# Patient Record
Sex: Female | Born: 1950
Health system: Southern US, Community
[De-identification: ages and names within clinical notes are randomized; demographics above are authoritative.]

## PROBLEM LIST (undated history)

## (undated) DIAGNOSIS — F32A Depression, unspecified: Secondary | ICD-10-CM

## (undated) DIAGNOSIS — R0600 Dyspnea, unspecified: Secondary | ICD-10-CM

## (undated) DIAGNOSIS — N952 Postmenopausal atrophic vaginitis: Secondary | ICD-10-CM

## (undated) DIAGNOSIS — R32 Unspecified urinary incontinence: Secondary | ICD-10-CM

## (undated) DIAGNOSIS — E785 Hyperlipidemia, unspecified: Secondary | ICD-10-CM

## (undated) DIAGNOSIS — N289 Disorder of kidney and ureter, unspecified: Secondary | ICD-10-CM

## (undated) DIAGNOSIS — E669 Obesity, unspecified: Secondary | ICD-10-CM

## (undated) DIAGNOSIS — N390 Urinary tract infection, site not specified: Secondary | ICD-10-CM

## (undated) DIAGNOSIS — F329 Major depressive disorder, single episode, unspecified: Secondary | ICD-10-CM

## (undated) DIAGNOSIS — I10 Essential (primary) hypertension: Secondary | ICD-10-CM

## (undated) DIAGNOSIS — F419 Anxiety disorder, unspecified: Secondary | ICD-10-CM

## (undated) DIAGNOSIS — E119 Type 2 diabetes mellitus without complications: Secondary | ICD-10-CM

## (undated) DIAGNOSIS — I219 Acute myocardial infarction, unspecified: Secondary | ICD-10-CM

## (undated) HISTORY — DX: Major depressive disorder, single episode, unspecified: F32.9

## (undated) HISTORY — DX: Anxiety disorder, unspecified: F41.9

## (undated) HISTORY — DX: Unspecified urinary incontinence: R32

## (undated) HISTORY — DX: Essential (primary) hypertension: I10

## (undated) HISTORY — DX: Urinary tract infection, site not specified: N39.0

## (undated) HISTORY — DX: Postmenopausal atrophic vaginitis: N95.2

## (undated) HISTORY — DX: Type 2 diabetes mellitus without complications: E11.9

## (undated) HISTORY — PX: ROTATOR CUFF REPAIR: SHX139

## (undated) HISTORY — DX: Obesity, unspecified: E66.9

## (undated) HISTORY — PX: CARDIAC CATHETERIZATION: SHX172

## (undated) HISTORY — DX: Disorder of kidney and ureter, unspecified: N28.9

## (undated) HISTORY — DX: Depression, unspecified: F32.A

## (undated) HISTORY — DX: Acute myocardial infarction, unspecified: I21.9

## (undated) HISTORY — DX: Hyperlipidemia, unspecified: E78.5

## (undated) HISTORY — PX: NECK SURGERY: SHX720

## (undated) HISTORY — PX: DENTAL SURGERY: SHX609

---

## 1999-02-11 HISTORY — PX: BUNIONECTOMY: SHX129

## 2002-07-29 ENCOUNTER — Encounter: Admission: RE | Admit: 2002-07-29 | Discharge: 2002-07-29 | Payer: Self-pay | Admitting: Neurosurgery

## 2002-07-29 ENCOUNTER — Encounter: Payer: Self-pay | Admitting: Neurosurgery

## 2002-08-18 ENCOUNTER — Ambulatory Visit (HOSPITAL_COMMUNITY): Admission: RE | Admit: 2002-08-18 | Discharge: 2002-08-19 | Payer: Self-pay | Admitting: Neurosurgery

## 2002-08-18 ENCOUNTER — Encounter: Payer: Self-pay | Admitting: Neurosurgery

## 2002-09-26 ENCOUNTER — Encounter: Admission: RE | Admit: 2002-09-26 | Discharge: 2002-09-26 | Payer: Self-pay

## 2002-09-26 ENCOUNTER — Encounter: Payer: Self-pay | Admitting: Neurosurgery

## 2005-06-28 ENCOUNTER — Ambulatory Visit: Payer: Self-pay | Admitting: Orthopedic Surgery

## 2005-07-31 ENCOUNTER — Ambulatory Visit (HOSPITAL_BASED_OUTPATIENT_CLINIC_OR_DEPARTMENT_OTHER): Admission: RE | Admit: 2005-07-31 | Discharge: 2005-08-01 | Payer: Self-pay | Admitting: Orthopedic Surgery

## 2007-04-11 DIAGNOSIS — I219 Acute myocardial infarction, unspecified: Secondary | ICD-10-CM

## 2007-04-11 HISTORY — DX: Acute myocardial infarction, unspecified: I21.9

## 2007-05-06 ENCOUNTER — Other Ambulatory Visit: Payer: Self-pay

## 2007-05-06 ENCOUNTER — Inpatient Hospital Stay: Payer: Self-pay | Admitting: Specialist

## 2007-06-16 ENCOUNTER — Encounter: Payer: Self-pay | Admitting: Internal Medicine

## 2007-07-12 ENCOUNTER — Encounter: Payer: Self-pay | Admitting: Internal Medicine

## 2007-08-11 ENCOUNTER — Encounter: Payer: Self-pay | Admitting: Internal Medicine

## 2007-09-11 ENCOUNTER — Encounter: Payer: Self-pay | Admitting: Internal Medicine

## 2007-10-12 ENCOUNTER — Encounter: Payer: Self-pay | Admitting: Internal Medicine

## 2012-02-25 LAB — HM PAP SMEAR: HM Pap smear: NEGATIVE

## 2012-03-17 ENCOUNTER — Ambulatory Visit: Payer: Self-pay | Admitting: Obstetrics & Gynecology

## 2014-06-28 ENCOUNTER — Other Ambulatory Visit: Payer: Self-pay | Admitting: Urology

## 2014-06-28 DIAGNOSIS — R319 Hematuria, unspecified: Secondary | ICD-10-CM

## 2014-07-05 ENCOUNTER — Ambulatory Visit
Admission: RE | Admit: 2014-07-05 | Discharge: 2014-07-05 | Disposition: A | Discharge status: Home / Self Care | Source: Ambulatory Visit | Attending: Urology | Admitting: Urology

## 2014-07-05 DIAGNOSIS — R319 Hematuria, unspecified: Secondary | ICD-10-CM

## 2014-07-05 MED ORDER — IOHEXOL 300 MG/ML  SOLN
125.0000 mL | Freq: Once | INTRAMUSCULAR | Status: AC | PRN
  Administered 2014-07-05: 125 mL via INTRAVENOUS

## 2014-07-16 ENCOUNTER — Telehealth: Payer: Self-pay | Admitting: Urology

## 2014-07-16 NOTE — Telephone Encounter (Signed)
We need to call [phone] to obtain prior authorization for her Myrbetriq.    [facility] Attn: [place] [address]

## 2014-07-17 ENCOUNTER — Other Ambulatory Visit: Payer: Self-pay | Admitting: Family Medicine

## 2014-07-17 ENCOUNTER — Other Ambulatory Visit: Payer: Self-pay

## 2014-07-17 DIAGNOSIS — N2889 Other specified disorders of kidney and ureter: Secondary | ICD-10-CM

## 2014-07-18 ENCOUNTER — Encounter: Payer: Self-pay | Admitting: *Deleted

## 2014-07-20 ENCOUNTER — Ambulatory Visit
Admission: RE | Admit: 2014-07-20 | Discharge: 2014-07-20 | Disposition: A | Discharge status: Home / Self Care | Source: Ambulatory Visit | Attending: Urology | Admitting: Urology

## 2014-07-20 ENCOUNTER — Encounter: Payer: Self-pay | Admitting: *Deleted

## 2014-07-20 DIAGNOSIS — N2889 Other specified disorders of kidney and ureter: Secondary | ICD-10-CM | POA: Diagnosis present

## 2014-07-20 DIAGNOSIS — N289 Disorder of kidney and ureter, unspecified: Secondary | ICD-10-CM | POA: Insufficient documentation

## 2014-07-20 DIAGNOSIS — N281 Cyst of kidney, acquired: Secondary | ICD-10-CM | POA: Diagnosis not present

## 2014-07-20 MED ORDER — GADOBENATE DIMEGLUMINE 529 MG/ML IV SOLN
20.0000 mL | Freq: Once | INTRAVENOUS | Status: AC | PRN
  Administered 2014-07-20: 20 mL via INTRAVENOUS

## 2014-07-24 ENCOUNTER — Ambulatory Visit (INDEPENDENT_AMBULATORY_CARE_PROVIDER_SITE_OTHER): Admitting: Urology

## 2014-07-24 ENCOUNTER — Encounter: Payer: Self-pay | Admitting: Urology

## 2014-07-24 VITALS — BP 148/84 | HR 84 | Ht 66.0 in | Wt 190.4 lb

## 2014-07-24 DIAGNOSIS — R312 Other microscopic hematuria: Secondary | ICD-10-CM

## 2014-07-24 DIAGNOSIS — G44209 Tension-type headache, unspecified, not intractable: Secondary | ICD-10-CM | POA: Insufficient documentation

## 2014-07-24 DIAGNOSIS — N281 Cyst of kidney, acquired: Secondary | ICD-10-CM | POA: Insufficient documentation

## 2014-07-24 DIAGNOSIS — E119 Type 2 diabetes mellitus without complications: Secondary | ICD-10-CM | POA: Insufficient documentation

## 2014-07-24 DIAGNOSIS — E782 Mixed hyperlipidemia: Secondary | ICD-10-CM | POA: Insufficient documentation

## 2014-07-24 DIAGNOSIS — E1169 Type 2 diabetes mellitus with other specified complication: Secondary | ICD-10-CM | POA: Insufficient documentation

## 2014-07-24 DIAGNOSIS — R3129 Other microscopic hematuria: Secondary | ICD-10-CM | POA: Insufficient documentation

## 2014-07-24 DIAGNOSIS — Q6100 Congenital renal cyst, unspecified: Secondary | ICD-10-CM

## 2014-07-24 DIAGNOSIS — E785 Hyperlipidemia, unspecified: Secondary | ICD-10-CM | POA: Insufficient documentation

## 2014-07-24 NOTE — Progress Notes (Addendum)
07/24/2014 8:25 AM   Melissa Arroyo [birth year] [id number]  Referring provider: Glendon Axe, MD [facility] [place], [place] [address]  Chief Complaint  Patient presents with  . Follow up Diagnostic test    MRI results    HPI: Melissa Arroyo is a 64 year old white female who underwent MR of the kidneys after CT urogram discovered a suspicious lesion in the left kidney.  Patient underwent CT urogram for microscopic hematuria. She does have a history of recurrent urinary tract infection and urge incontinence.  So far, her recurrent urinary tract infections have been controlled with vaginal estrogen cream and cranberry tablets. Her urge incontinence is well controlled with Myrbetriq.  She denies any gross hematuria. She currently denies any dysuria, fevers, chills, nausea, vomiting or suprapubic pain.  MR of the kidneys has noted a Bosniak 2 cyst in the left kidney. Report is in the pertinent imaging section.   PMH: Past Medical History  Diagnosis Date  . Depression   . Hyperlipidemia   . Anxiety   . Heart attack   . Diabetes mellitus   . Obesity   . Vaginal atrophy   . Urinary incontinence in female   . Renal lesion   . Recurrent UTI   . Hypertension     Surgical History: Past Surgical History  Procedure Laterality Date  . Bunionectomy  2001  . Rotator cuff repair Left   . Neck surgery      Home Medications:    Medication List       This list is accurate as of: 07/24/14  8:25 AM.  Always use your most recent med list.               buPROPion 150 MG 24 hr tablet  Commonly known as:  WELLBUTRIN XL  TAKE 1 TABLET IN THE MORNING FOR 7 DAYS THEN TAKE 2 TABLETS IN THE MORNING     clonazePAM 1 MG tablet  Commonly known as:  KLONOPIN  TAKE 1/2 TO 1 TABLET BY MOUTH TWICE A DAY     diazepam 10 MG tablet  Commonly known as:  VALIUM  TAKE 30MINS PRIOR TO THE MRI     glipiZIDE 10 MG 24 hr tablet  Commonly known as:  GLUCOTROL XL  TAKE 1  TABLET (10 MG) BY ORAL ROUTE ONCE DAILY WITH BREAKFAST     lovastatin 40 MG tablet  Commonly known as:  MEVACOR  Take 40 mg by mouth at bedtime.     meclizine 12.5 MG tablet  Commonly known as:  ANTIVERT  TAKE 1 TABLET BY MOUTH EVERY 12HRS FOR VERTIGO     metFORMIN 500 MG 24 hr tablet  Commonly known as:  GLUCOPHAGE-XR  TAKE 2 TABLETS BY ORAL ROUTE 2 TIMES A DAY WITH MEALS FOR 90 DAYS     MYRBETRIQ 25 MG Tb24 tablet  Generic drug:  mirabegron ER  Take 25 mg by mouth daily.     nitrofurantoin (macrocrystal-monohydrate) 100 MG capsule  Commonly known as:  MACROBID  Take 100 mg by mouth 2 (two) times daily.     OLANZapine-FLUoxetine 6-25 MG per capsule  Commonly known as:  SYMBYAX  Take 1 capsule by mouth daily.     OLANZapine-FLUoxetine 6-50 MG per capsule  Commonly known as:  SYMBYAX  Take 1 capsule by mouth daily.     phenazopyridine 100 MG tablet  Commonly known as:  PYRIDIUM  TAKE 1 TABLET BY MOUTH EVERY 8 HOURS AS NEEDED  FOR UP TO 2 DAYS WITH ANTIBIOTICS     propranolol 20 MG tablet  Commonly known as:  INDERAL  TAKE 1 TABLET TWICE A DAY(MIGRAINE PROPHYLAXSIS)        Allergies:  Allergies  Allergen Reactions  . Penicillins Rash  . Vesicare [Solifenacin] Anaphylaxis    Throat Closed    Family History: Family History  Problem Relation Age of Onset  . Hypertension Mother   . Cancer Mother     Luekemia  . Cancer      Bladder,Kidney,Prostate    Social History:  reports that she has never smoked. She does not have any smokeless tobacco history on file. She reports that she does not drink alcohol or use illicit drugs.  ROS: Urological Symptom Review  Patient is experiencing the following symptoms: Get up at night to urinate Leakage of urine Urinary tract infection   Review of Systems  Gastrointestinal (upper)  : Negative for upper GI symptoms  Gastrointestinal (lower) : Negative for lower GI symptoms  Constitutional : Negative for  symptoms  Skin: Negative for skin symptoms  Eyes: Negative for eye symptoms  Ear/Nose/Throat : Negative for Ear/Nose/Throat symptoms  Hematologic/Lymphatic: Negative for Hematologic/Lymphatic symptoms  Cardiovascular : Negative for cardiovascular symptoms  Respiratory : Negative for respiratory symptoms  Endocrine: Negative for endocrine symptoms  Musculoskeletal: Negative for musculoskeletal symptoms  Neurological: Negative for neurological symptoms  Psychologic: Depression Anxiety   Physical Exam: BP 148/84 mmHg  Pulse 84  Ht 5\' 6"  (1.676 m)  Wt 190 lb 6.4 oz (86.365 kg)  BMI 30.75 kg/m2   Laboratory Data: No results found for: WBC, HGB, HCT, MCV, PLT  No results found for: CREATININE  No results found for: PSA  No results found for: TESTOSTERONE  No results found for: HGBA1C  Urinalysis No results found for: COLORURINE, APPEARANCEUR, LABSPEC, PHURINE, GLUCOSEU, HGBUR, BILIRUBINUR, KETONESUR, PROTEINUR, UROBILINOGEN, NITRITE, LEUKOCYTESUR  Pertinent Imaging: CLINICAL DATA: Evaluate left kidney lesion demonstrated on recent CT scan.  EXAM: MRI ABDOMEN WITHOUT AND WITH CONTRAST  TECHNIQUE: Multiplanar multisequence MR imaging of the abdomen was performed both before and after the administration of intravenous contrast.  CONTRAST: 63mL MULTIHANCE GADOBENATE DIMEGLUMINE 529 MG/ML IV SOLN  COMPARISON: CT scan 07/05/2014  FINDINGS: Lower chest: No lung base lesions are identified. No pleural effusion. The heart is within normal limits in size. No pericardial effusion.  Hepatobiliary: No focal hepatic lesions or intrahepatic biliary dilatation. The gallbladder is normal. No common bile duct dilatation.  Pancreas: No focal pancreatic lesion or inflammatory changes. Small fatty clefts are noted. No ductal dilatation.  Spleen: Normal size. No focal lesions.  Adrenals/Urinary Tract: The adrenal glands are normal. The 8 mm  left lower pole intra pole junction region lesion has bright T1 signal intensity and is not demonstrate any definite contrast enhancement. Findings consistent with a hemorrhagic cyst. Stable scarring changes involving the right kidney. No worrisome renal lesions or hydronephrosis.  Stomach/Bowel: The stomach is moderately distended with fluid and food. The duodenum, visualized small bowel and visualized colon are grossly normal.  Vascular/Lymphatic: No mesenteric or retroperitoneal mass or adenopathy. The aorta and branch vessels are patent. The major venous structures are patent.  Other: No free abdominal fluid collections or abdominal wall hernia.  Musculoskeletal: No significant osseous findings.  IMPRESSION: 8 mm left renal lesion has MR imaging features of a benign hemorrhagic cyst (Bosniak 2).  No other significant abdominal findings.   Electronically Signed  By: Marijo Sanes M.D.  On: 07/20/2014 14:29  Assessment & Plan:   1. Microscopic hematuria- Patient has completed CT urogram and MR of kidneys. So far, Bosniak 2 cyst has been identified, no malignancies.  Patient will be scheduled for cystoscopic examination for completion of hematuria workup.  Patient states her husband had numerous cystoscopic examination, so she is well aware of the procedure and what to expect afterwards.  2. Bosniak 2 left renal cyst- With Bosniak 2 cysts, no follow-up is needed.  There is approximately 0% chance of malignancy with Bosniak 2 cysts.  3. Recurrent UTI's-  Patient's urinary tract infections have been controlled with vaginal estrogen cream and cranberry tablets. She will continue these and follow up in one year's time. If she should develop symptoms of a urinary tract infection, he should present to our office for a catheterized specimen for urinalysis and sent for culture.  4.  Atrophic vaginitis- Patient will continue vaginal estrogen cream nightly 3 days a week. She  will follow-up in one year's time for reexamination of the vaginal mucosa.  5. Urge incontinence- Patient's symptoms are controlled with Myrbetriq 25 mg one tablet daily.  She will RTC in one year for symptom recheck and PVR.    There are no diagnoses linked to this encounter.  No Follow-up on file.  Zara Council, [facility] [address], [place], [place] [address] [phone]

## 2014-07-26 ENCOUNTER — Ambulatory Visit

## 2014-08-09 ENCOUNTER — Other Ambulatory Visit: Payer: Self-pay | Admitting: Urology

## 2014-08-09 ENCOUNTER — Ambulatory Visit (INDEPENDENT_AMBULATORY_CARE_PROVIDER_SITE_OTHER): Admitting: Urology

## 2014-08-09 ENCOUNTER — Encounter: Payer: Self-pay | Admitting: Urology

## 2014-08-09 VITALS — BP 145/77 | HR 86 | Ht 65.0 in | Wt 189.5 lb

## 2014-08-09 DIAGNOSIS — R312 Other microscopic hematuria: Secondary | ICD-10-CM | POA: Diagnosis not present

## 2014-08-09 DIAGNOSIS — R3129 Other microscopic hematuria: Secondary | ICD-10-CM

## 2014-08-09 LAB — URINALYSIS, COMPLETE
Bilirubin, UA: NEGATIVE
GLUCOSE, UA: NEGATIVE
Ketones, UA: NEGATIVE
Leukocytes, UA: NEGATIVE
Nitrite, UA: NEGATIVE
PROTEIN UA: NEGATIVE
RBC, UA: NEGATIVE
Specific Gravity, UA: >1.03 — ABNORMAL HIGH (ref 1.005–1.030)
Urobilinogen, Ur: 0.2 mg/dL (ref 0.2–1.0)
pH, UA: 5.5 (ref 5.0–7.5)

## 2014-08-09 LAB — MICROSCOPIC EXAMINATION: BACTERIA UA: NONE SEEN

## 2014-08-09 MED ORDER — LIDOCAINE HCL 2 % EX GEL
1.0000 "application " | Freq: Once | CUTANEOUS | Status: AC
  Administered 2014-08-09: 1 via URETHRAL

## 2014-08-09 MED ORDER — CIPROFLOXACIN HCL 500 MG PO TABS
500.0000 mg | ORAL_TABLET | Freq: Once | ORAL | Status: AC
  Administered 2014-08-09: 500 mg via ORAL

## 2014-09-11 ENCOUNTER — Telehealth: Payer: Self-pay | Admitting: Urology

## 2014-09-11 ENCOUNTER — Ambulatory Visit (INDEPENDENT_AMBULATORY_CARE_PROVIDER_SITE_OTHER)

## 2014-09-11 DIAGNOSIS — R300 Dysuria: Secondary | ICD-10-CM

## 2014-09-11 LAB — MICROSCOPIC EXAMINATION: WBC, UA: >30 /hpf — AB (ref 0–?)

## 2014-09-11 LAB — URINALYSIS, COMPLETE
BILIRUBIN UA: NEGATIVE
Glucose, UA: NEGATIVE
Ketones, UA: NEGATIVE
Nitrite, UA: NEGATIVE
PROTEIN UA: NEGATIVE
Specific Gravity, UA: 1.015 (ref 1.005–1.030)
UUROB: 0.2 mg/dL (ref 0.2–1.0)
pH, UA: 7 (ref 5.0–7.5)

## 2014-09-11 NOTE — Progress Notes (Signed)
In and Out Catheterization  Patient is present today for a I & O catheterization due to dysuria. Patient was cleaned and prepped in a sterile fashion with betadine and Lidocaine 2% jelly was instilled into the urethra.  A 14FR cath was inserted no complications were noted , 225ml of urine return was noted, urine was clear and yellow in color. A clean urine sample was collected for u/a and cx. Bladder was drained  And catheter was removed with out difficulty.    Preformed by: Toniann Fail, LPN  Per Larene Beach pyridium prescription strength was called in to pt.

## 2014-09-11 NOTE — Telephone Encounter (Signed)
Patient called the office this morning.  She has another UTI.  She is requesting that we call in 2 prescriptions (antibiotic and something for burning) to [facility] for her or does she need to come to the office to submit a urine sample?  She has an upcoming appointment with Zara Council next week.

## 2014-09-11 NOTE — Telephone Encounter (Signed)
I spoke w/Melissa Arroyo and instructed her to come in so the she may provide [place] with a "catheter" urine sample.  I explained the process and reason to the pt and she agreed to come in today at 11:00 AM  to provide the sample.  I've added her to the schedule as a Nurse Visit. Marland Kitchen . sm

## 2014-09-13 LAB — CULTURE, URINE COMPREHENSIVE

## 2014-09-13 NOTE — Clinical Documentation Improvement (Signed)
Pt called requesting Urine Culture results from 09/11/14 sample and was informed that they would not be available until Friday (09/15/14) at the earliest.  I explained My Chart and how to sign-up. Marland Kitchen . sm

## 2014-09-14 ENCOUNTER — Telehealth: Payer: Self-pay

## 2014-09-14 DIAGNOSIS — N390 Urinary tract infection, site not specified: Secondary | ICD-10-CM

## 2014-09-14 MED ORDER — SULFAMETHOXAZOLE-TRIMETHOPRIM 800-160 MG PO TABS: 1.0000 | ORAL_TABLET | Freq: Two times a day (BID) | ORAL | Status: AC

## 2014-09-14 NOTE — Telephone Encounter (Signed)
-----   Message from Nori Riis, PA-C sent at 09/13/2014  4:10 PM EDT ----- Patient has a +UCx.  They need to start Septra DS one  twice daily for seven days and then we need to check a CATH specimen at her appointment on the 17th.

## 2014-09-14 NOTE — Telephone Encounter (Signed)
Spoke with pt and made aware of positive urine cx. Pt voiced understanding. Medication was sent to pt pharmacy.

## 2014-09-27 ENCOUNTER — Ambulatory Visit (INDEPENDENT_AMBULATORY_CARE_PROVIDER_SITE_OTHER): Admitting: Urology

## 2014-09-27 ENCOUNTER — Encounter: Payer: Self-pay | Admitting: Urology

## 2014-09-27 VITALS — BP 118/70 | HR 73 | Ht 65.0 in | Wt 189.1 lb

## 2014-09-27 DIAGNOSIS — N390 Urinary tract infection, site not specified: Secondary | ICD-10-CM | POA: Diagnosis not present

## 2014-09-27 DIAGNOSIS — N941 Dyspareunia: Secondary | ICD-10-CM | POA: Diagnosis not present

## 2014-09-27 DIAGNOSIS — N952 Postmenopausal atrophic vaginitis: Secondary | ICD-10-CM

## 2014-09-27 DIAGNOSIS — N3941 Urge incontinence: Secondary | ICD-10-CM

## 2014-09-27 DIAGNOSIS — IMO0002 Reserved for concepts with insufficient information to code with codable children: Secondary | ICD-10-CM

## 2014-09-27 LAB — MICROSCOPIC EXAMINATION: BACTERIA UA: NONE SEEN

## 2014-09-27 LAB — URINALYSIS, COMPLETE
Bilirubin, UA: NEGATIVE
GLUCOSE, UA: NEGATIVE
Leukocytes, UA: NEGATIVE
NITRITE UA: NEGATIVE
Protein, UA: NEGATIVE
RBC, UA: NEGATIVE
Specific Gravity, UA: >1.03 — ABNORMAL HIGH (ref 1.005–1.030)
Urobilinogen, Ur: 0.2 mg/dL (ref 0.2–1.0)
pH, UA: 5 (ref 5.0–7.5)

## 2014-09-27 LAB — BLADDER SCAN AMB NON-IMAGING: Scan Result: 10

## 2014-09-27 NOTE — Progress Notes (Signed)
In and Out Catheterization  Patient is present today for a I & O catheterization due to recurrent UTI. Patient was cleaned and prepped in a sterile fashion with betadine and Lidocaine 2% jelly was instilled into the urethra.  A 14 FR cath was inserted no complications were noted , 10 ml of urine return was noted, urine was yellow  in color. A clean urine sample was collected for cath specimen. Bladder was drained  And catheter was removed with out difficulty.    Preformed by: Bonnye Fava, CMA

## 2014-09-28 DIAGNOSIS — N952 Postmenopausal atrophic vaginitis: Secondary | ICD-10-CM | POA: Insufficient documentation

## 2014-09-28 DIAGNOSIS — N3941 Urge incontinence: Secondary | ICD-10-CM | POA: Insufficient documentation

## 2014-09-28 DIAGNOSIS — N941 Unspecified dyspareunia: Secondary | ICD-10-CM | POA: Insufficient documentation

## 2014-09-28 DIAGNOSIS — N390 Urinary tract infection, site not specified: Secondary | ICD-10-CM | POA: Insufficient documentation

## 2014-09-28 NOTE — Progress Notes (Signed)
09/27/2014 8:31 AM   Melissa Arroyo 06-29-1950 [id number]  Referring provider: Glendon Axe, MD [address] [place], [place] [address]  Chief Complaint  Patient presents with  . Recurrent UTI    PATIENT STATES NO SYMPTOMS NOW  . Dyspareunia    HPI: Melissa Arroyo is a 64 year old white female who presents today for further evaluation of painful intercourse.  Patient has recently started a new relationship and when they attempted intercourse she found painful for her. She states the pain occurs at the introitus as her partner is trying to penetrate.  She does have a history of vaginal trophy and has recently been started on estrogen vaginal cream. She is applying the cream 3 nights weekly.   She is still having significant vaginal dryness.  Patient had recently underwent a workup for hematuria. No malignancies were discovered. She was found to have a Bosniak 2 cyst in her left kidney.    She has a history of recurrent urinary tract infections with pan sensitive Escherichia coli. Her last infection with [place] was on 09/11/2014. Today her catheter specimen is clear. She is reporting no symptoms of urinary tract infection at this time.  She also experiences urgency incontinence. She is currently taking Myrbetriq for this condition and her symptoms are controlled.  PMH: Past Medical History  Diagnosis Date  . Depression   . Hyperlipidemia   . Anxiety   . Heart attack   . Diabetes mellitus   . Obesity   . Vaginal atrophy   . Urinary incontinence in female   . Renal lesion   . Recurrent UTI   . Hypertension     Surgical History: Past Surgical History  Procedure Laterality Date  . Bunionectomy  2001  . Rotator cuff repair Left   . Neck surgery      Home Medications:    Medication List       This list is accurate as of: 09/27/14 11:59 PM.  Always use your most recent med list.               aspirin EC 81 MG tablet  Take by mouth.     clonazePAM 1 MG tablet    Commonly known as:  KLONOPIN  TAKE 1/2 TO 1 TABLET BY MOUTH TWICE A DAY     glipiZIDE 10 MG 24 hr tablet  Commonly known as:  GLUCOTROL XL  TAKE 1 TABLET (10 MG) BY ORAL ROUTE ONCE DAILY WITH BREAKFAST     lovastatin 40 MG tablet  Commonly known as:  MEVACOR  Take 40 mg by mouth at bedtime.     metFORMIN 500 MG 24 hr tablet  Commonly known as:  GLUCOPHAGE-XR  TAKE 2 TABLETS BY ORAL ROUTE 2 TIMES A DAY WITH MEALS FOR 90 DAYS     MYRBETRIQ 25 MG Tb24 tablet  Generic drug:  mirabegron ER  Take 25 mg by mouth daily.     OLANZapine-FLUoxetine 6-50 MG per capsule  Commonly known as:  SYMBYAX  Take 1 capsule by mouth daily.        Allergies:  Allergies  Allergen Reactions  . Penicillins Rash  . Vesicare [Solifenacin] Anaphylaxis    Throat Closed    Family History: Family History  Problem Relation Age of Onset  . Hypertension Mother   . Cancer Mother     Luekemia  . Cancer      Bladder,Kidney,Prostate    Social History:  reports that she has never smoked.  She does not have any smokeless tobacco history on file. She reports that she does not drink alcohol or use illicit drugs.  ROS: UROLOGY Frequent Urination?: No Hard to postpone urination?: No Burning/pain with urination?: No Get up at night to urinate?: No Leakage of urine?: No Urine stream starts and stops?: No Trouble starting stream?: No Do you have to strain to urinate?: No Blood in urine?: No Urinary tract infection?: No Sexually transmitted disease?: No Injury to kidneys or bladder?: No Painful intercourse?: Yes Weak stream?: No Currently pregnant?: No Vaginal bleeding?: No Last menstrual period?: N  Gastrointestinal Nausea?: No Vomiting?: No Indigestion/heartburn?: No Diarrhea?: No Constipation?: No  Constitutional Fever: No Night sweats?: No Weight loss?: No Fatigue?: No  Skin Skin rash/lesions?: No Itching?: No  Eyes Blurred vision?: No Double vision?:  No  Ears/Nose/Throat Sore throat?: No Sinus problems?: No  Hematologic/Lymphatic Swollen glands?: No Easy bruising?: No  Cardiovascular Leg swelling?: No Chest pain?: No  Respiratory Cough?: No Shortness of breath?: No  Endocrine Excessive thirst?: No  Musculoskeletal Back pain?: No Joint pain?: No  Neurological Headaches?: No Dizziness?: No  Psychologic Depression?: No Anxiety?: No  Physical Exam: BP 118/70 mmHg  Pulse 73  Ht 5\' 5"  (1.651 m)  Wt 189 lb 1.6 oz (85.775 kg)  BMI 31.47 kg/m2   Laboratory Data: Results for orders placed or performed in visit on 09/27/14  Microscopic Examination  Result Value Ref Range   WBC, UA 0-5 0 -  5 /hpf   RBC, UA 0-2 0 -  2 /hpf   Epithelial Cells (non renal) 0-10 0 - 10 /hpf   Mucus, UA Present (A) Not Estab.   Bacteria, UA None seen None seen/Few  Urinalysis, Complete  Result Value Ref Range   Specific Gravity, UA >1.030 (H) 1.005 - 1.030   pH, UA 5.0 5.0 - 7.5   Color, UA Yellow Yellow   Appearance Ur Clear Clear   Leukocytes, UA Negative Negative   Protein, UA Negative Negative/Trace   Glucose, UA Negative Negative   Ketones, UA Trace (A) Negative   RBC, UA Negative Negative   Bilirubin, UA Negative Negative   Urobilinogen, Ur 0.2 0.2 - 1.0 mg/dL   Nitrite, UA Negative Negative   Microscopic Examination See below:   BLADDER SCAN AMB NON-IMAGING  Result Value Ref Range   Scan Result 10     Assessment & Plan:    1. Dyspareunia:   Patient has a history of atrophic vaginitis and is currently using vaginal estrogen cream.  Now that she has become sexually active, she may not be able to apply the cream as prescribed in an effort to reduce transference to her partner.  I have given her Osphena samples (#30) and will have her return in one month.  I have advised her that the Osphena carries similar risks as oral estrogen.  I have also given her samples of a vaginal lubricant.  2. Recurrent UTI's- Patient's  urinary tract infections have been controlled with vaginal estrogen cream and cranberry tablets. She will continue these and follow up in one year's time. If she should develop symptoms of a urinary tract infection, she should present to our office for a catheterized specimen for urinalysis and sent for culture.    - Urinalysis, Complete - BLADDER SCAN AMB NON-IMAGING  3. Atrophic vaginitis- Patient will continue vaginal estrogen cream nightly 3 days a week. She will follow-up in one year's time for reexamination of the vaginal mucosa.  4. Urge  incontinence- Patient's symptoms are controlled with Myrbetriq 25 mg one tablet daily. She will RTC in one year for symptom recheck and PVR.   Return in about 1 month (around 10/28/2014) for office visit.  Zara Council, [facility] [address] [place], [place] [address] [phone]

## 2014-10-04 ENCOUNTER — Ambulatory Visit: Payer: Self-pay | Admitting: Podiatry

## 2014-10-05 ENCOUNTER — Ambulatory Visit: Payer: Self-pay | Admitting: Podiatry

## 2014-10-05 ENCOUNTER — Ambulatory Visit (INDEPENDENT_AMBULATORY_CARE_PROVIDER_SITE_OTHER): Admitting: Podiatry

## 2014-10-05 ENCOUNTER — Encounter: Payer: Self-pay | Admitting: Podiatry

## 2014-10-05 ENCOUNTER — Ambulatory Visit (INDEPENDENT_AMBULATORY_CARE_PROVIDER_SITE_OTHER)

## 2014-10-05 VITALS — BP 119/69 | HR 72 | Resp 16 | Ht 65.0 in | Wt 189.0 lb

## 2014-10-05 DIAGNOSIS — M79673 Pain in unspecified foot: Secondary | ICD-10-CM

## 2014-10-05 DIAGNOSIS — E119 Type 2 diabetes mellitus without complications: Secondary | ICD-10-CM

## 2014-10-05 DIAGNOSIS — M779 Enthesopathy, unspecified: Secondary | ICD-10-CM

## 2014-10-05 DIAGNOSIS — Z0189 Encounter for other specified special examinations: Secondary | ICD-10-CM

## 2014-10-05 LAB — HM DIABETES FOOT EXAM

## 2014-10-05 NOTE — Progress Notes (Signed)
   Subjective:    Patient ID: Melissa Arroyo, female    DOB: December 13, 1950, 64 y.o.   MRN: [id number]  HPI Comments:    "Pre-Diabetic" x 1.5 years and last A1C was 7.0   she presents today as a 64 year old female with a chief complaint of a callused area beneath the plantar lateral aspect of the fourth metatarsophalangeal joint right foot. She's had multiple foot surgeries in the past. This states that this is starting to bother her over the past several months that she is just concerned as to what it was particularly since she is prediabetic with a hemoglobin  A1c of 7.0.   Review of Systems  All other systems reviewed and are negative.      Objective:   Physical Exam : 64 year old white female vital signs are stable she's alert and oriented 3 vital signs are stable alert and oriented 3 with no acute distress. Pulses are strongly palpable bilateral. Neurologic sensorium is intact per Semmes-Weinstein monofilament. Deep tendon reflexes are intact bilateral and muscle strength is 5 over 5 dorsiflexion plantar flexors and inverters and everters all intrinsic musculature is intact. Orthopedic evaluation demonstrates a prominent fourth metatarsophalangeal joint of the right foot with overlying reactive hyperkeratosis. Cutaneous evaluation demonstrates supple well-hydrated cues with exception of the hyperkeratosis plantar lateral aspect fourth metatarsophalangeal joint of the right foot. Radiographs today do demonstrate previous surgeries consisting of a fifth metatarsal osteotomy which on lateral view is elevated. This would be consistent with the pressure to the fourth metatarsal head resulting in a transfer lesion.        Assessment & Plan:   assessment: reactive hyperkeratosis plantar aspect fourth metatarsophalangeal joint right foot as a result of transfer lesion.  Plan: debrided the lesion for her today and recommended that she continue to have it debrided on a regular basis.

## 2014-10-31 ENCOUNTER — Telehealth: Payer: Self-pay | Admitting: Radiology

## 2014-10-31 NOTE — Telephone Encounter (Signed)
Pt called to cancel her f/u appt on 11/01/14. States the medicine she was given gave her hot flashes and she stopped taking it.  She does not plan to take it anymore.

## 2014-11-01 ENCOUNTER — Ambulatory Visit: Admitting: Urology

## 2014-12-21 ENCOUNTER — Encounter: Payer: Self-pay | Admitting: Urology

## 2014-12-21 ENCOUNTER — Ambulatory Visit (INDEPENDENT_AMBULATORY_CARE_PROVIDER_SITE_OTHER): Admitting: Urology

## 2014-12-21 VITALS — BP 150/77 | HR 61 | Ht 65.0 in | Wt 192.0 lb

## 2014-12-21 DIAGNOSIS — N952 Postmenopausal atrophic vaginitis: Secondary | ICD-10-CM | POA: Diagnosis not present

## 2014-12-21 DIAGNOSIS — R300 Dysuria: Secondary | ICD-10-CM | POA: Diagnosis not present

## 2014-12-21 LAB — URINALYSIS, COMPLETE
BILIRUBIN UA: NEGATIVE
GLUCOSE, UA: NEGATIVE
KETONES UA: NEGATIVE
Nitrite, UA: NEGATIVE
PROTEIN UA: NEGATIVE
RBC UA: NEGATIVE
SPEC GRAV UA: 1.015 (ref 1.005–1.030)
Urobilinogen, Ur: 0.2 mg/dL (ref 0.2–1.0)
pH, UA: 7.5 (ref 5.0–7.5)

## 2014-12-21 LAB — MICROSCOPIC EXAMINATION
Bacteria, UA: NONE SEEN
EPITHELIAL CELLS (NON RENAL): NONE SEEN /HPF (ref 0–10)

## 2014-12-21 MED ORDER — PHENAZOPYRIDINE HCL 200 MG PO TABS: 200.0000 mg | ORAL_TABLET | Freq: Three times a day (TID) | ORAL | Status: DC | PRN

## 2014-12-21 MED ORDER — SULFAMETHOXAZOLE-TRIMETHOPRIM 800-160 MG PO TABS: 1.0000 | ORAL_TABLET | Freq: Two times a day (BID) | ORAL | Status: DC

## 2014-12-21 NOTE — Progress Notes (Signed)
In and Out Catheterization  Patient is present today for a I & O catheterization due to recurrent uti. Patient was cleaned and prepped in a sterile fashion with betadine and Lidocaine 2% jelly was instilled into the urethra.  A 14FR cath was inserted no complications were noted , 120ml of urine return was noted, urine was yellow in color. A clean urine sample was collected for UA and culture. Bladder was drained  and catheter was removed with out difficulty.    Preformed by: Lyndee Hensen CMA

## 2014-12-21 NOTE — Progress Notes (Signed)
12/21/2014 7:20 PM   [name] 10/28/1950 [id number]  Referring provider: Glendon Axe, MD [place] [facility] [place], [place] [address]  Chief Complaint  Patient presents with  . Dysuria  . Recurrent UTI    HPI: Patient is a 64 year old white female with a history of recurrent urinary tract infections who presents today complaining of a 3 day history of burning at the end of her urinary stream. She is currently taking Pyridium for the discomfort. She has tried to increase her water intake in an effort to stave off the infection, but the burning at the end of urinary stream continues to worsen.  She is not experiencing any gross hematuria or suprapubic pain. She has not had any associated fevers, chills, nausea or vomiting.  Her last positive urine culture with [place] was on 09/11/2014.  She states she is currently not sexually active. She is not having constipation or diarrhea. She uses good perineal hygiene. Her UA today is unremarkable.  She is no longer taking the Osphena.  She is using the vaginal estrogen cream sparingly.  She is taking her Myrbetriq 25 mg daily and it is controlling her urgency and urge incontinence.   PMH: Past Medical History  Diagnosis Date  . Depression   . Hyperlipidemia   . Anxiety   . Heart attack ([place])   . Diabetes mellitus ([place])   . Obesity   . Vaginal atrophy   . Urinary incontinence in female   . Renal lesion   . Recurrent UTI   . Hypertension     Surgical History: Past Surgical History  Procedure Laterality Date  . Bunionectomy  2001  . Rotator cuff repair Left   . Neck surgery      Home Medications:    Medication List       This list is accurate as of: 12/21/14 11:59 PM.  Always use your most recent med list.               aspirin EC 81 MG tablet  Take by mouth.     clonazePAM 1 MG tablet  Commonly known as:  KLONOPIN  TAKE 1/2 TO 1 TABLET BY MOUTH TWICE A DAY     glipiZIDE 10 MG 24 hr  tablet  Commonly known as:  GLUCOTROL XL  TAKE 1 TABLET (10 MG) BY ORAL ROUTE ONCE DAILY WITH BREAKFAST     lovastatin 40 MG tablet  Commonly known as:  MEVACOR  Take 40 mg by mouth at bedtime.     metFORMIN 500 MG 24 hr tablet  Commonly known as:  GLUCOPHAGE-XR  TAKE 2 TABLETS BY ORAL ROUTE 2 TIMES A DAY WITH MEALS FOR 90 DAYS     MYRBETRIQ 25 MG Tb24 tablet  Generic drug:  mirabegron ER  Take 25 mg by mouth daily.     OLANZapine-FLUoxetine 6-50 MG capsule  Commonly known as:  SYMBYAX  Take 1 capsule by mouth daily.     phenazopyridine 100 MG tablet  Commonly known as:  PYRIDIUM  Take 100 mg by mouth 3 (three) times daily as needed for pain.     phenazopyridine 200 MG tablet  Commonly known as:  PYRIDIUM  Take 1 tablet (200 mg total) by mouth 3 (three) times daily as needed for pain.     sulfamethoxazole-trimethoprim 800-160 MG tablet  Commonly known as:  BACTRIM DS,SEPTRA DS  Take 1 tablet by mouth every 12 (twelve) hours.  Allergies:  Allergies  Allergen Reactions  . Penicillins Rash  . Vesicare [Solifenacin] Anaphylaxis    Throat Closed    Family History: Family History  Problem Relation Age of Onset  . Hypertension Mother   . Cancer Mother     Luekemia  . Cancer      Bladder,Kidney,Prostate    Social History:  reports that she has never smoked. She does not have any smokeless tobacco history on file. She reports that she does not drink alcohol or use illicit drugs.  ROS: UROLOGY Frequent Urination?: No Hard to postpone urination?: No Burning/pain with urination?: Yes Get up at night to urinate?: No Leakage of urine?: No Urine stream starts and stops?: No Trouble starting stream?: No Do you have to strain to urinate?: No Blood in urine?: No Urinary tract infection?: No Sexually transmitted disease?: No Injury to kidneys or bladder?: No Painful intercourse?: No Weak stream?: No Currently pregnant?: No Vaginal bleeding?: No Last  menstrual period?: n  Gastrointestinal Nausea?: No Vomiting?: No Indigestion/heartburn?: No Diarrhea?: No Constipation?: No  Constitutional Fever: No Night sweats?: No Weight loss?: No Fatigue?: No  Skin Skin rash/lesions?: No Itching?: No  Eyes Blurred vision?: No Double vision?: No  Ears/Nose/Throat Sore throat?: No Sinus problems?: No  Hematologic/Lymphatic Swollen glands?: No Easy bruising?: No  Cardiovascular Leg swelling?: No Chest pain?: No  Respiratory Cough?: No Shortness of breath?: No  Endocrine Excessive thirst?: No  Musculoskeletal Back pain?: No Joint pain?: No  Neurological Headaches?: No Dizziness?: No  Psychologic Depression?: No Anxiety?: Yes  Physical Exam: BP 150/77 mmHg  Pulse 61  Ht 5\' 5"  (1.651 m)  Wt 192 lb (87.091 kg)  BMI 31.95 kg/m2  Constitutional: Well nourished. Alert and oriented, No acute distress. HEENT: [place] AT, moist mucus membranes. Trachea midline, no masses. Cardiovascular: No clubbing, cyanosis, or edema. Respiratory: Normal respiratory effort, no increased work of breathing. GI: Abdomen is soft, non tender, non distended, no abdominal masses. Liver and spleen not palpable.  No hernias appreciated.  Stool sample for occult testing is not indicated.   GU: No CVA tenderness.  No bladder fullness or masses.   Skin: No rashes, bruises or suspicious lesions. Lymph: No cervical or inguinal adenopathy. Neurologic: Grossly intact, no focal deficits, moving all 4 extremities. Psychiatric: Normal mood and affect.  Laboratory Data:  Urinalysis Results for orders placed or performed in visit on 12/21/14  CULTURE, URINE COMPREHENSIVE  Result Value Ref Range   Urine Culture, Comprehensive Final report (A)    Result 1 Escherichia coli (A)    ANTIMICROBIAL SUSCEPTIBILITY Comment   Microscopic Examination  Result Value Ref Range   WBC, UA 0-5 0 -  5 /hpf   RBC, UA 0-2 0 -  2 /hpf   Epithelial Cells (non renal)  None seen 0 - 10 /hpf   Bacteria, UA None seen None seen/Few  Urinalysis, Complete  Result Value Ref Range   Specific Gravity, UA 1.015 1.005 - 1.030   pH, UA 7.5 5.0 - 7.5   Color, UA Yellow Yellow   Appearance Ur Clear Clear   Leukocytes, UA Trace (A) Negative   Protein, UA Negative Negative/Trace   Glucose, UA Negative Negative   Ketones, UA Negative Negative   RBC, UA Negative Negative   Bilirubin, UA Negative Negative   Urobilinogen, Ur 0.2 0.2 - 1.0 mg/dL   Nitrite, UA Negative Negative   Microscopic Examination See below:      Assessment & Plan:    1. Dysuria:  Patient's UA is unremarkable at today's exam. She is having symptoms reminiscent of her previous infections. I will start Septra DS empirically while awaiting culture and sensitivity results.   We will adjust the antibiotic as appropriate.  - Urinalysis, Complete - CULTURE, URINE COMPREHENSIVE  2. Atrophic vaginitis:   I have encouraged the patient to apply the vaginal cream 3 nights weekly in an effort to prevent urinary tract infections. I have given her a sample of the Premarin cream.  Return for pending labs.  Zara Council, [facility] [address], [place], [place] [address] [phone]

## 2014-12-23 LAB — CULTURE, URINE COMPREHENSIVE

## 2014-12-24 DIAGNOSIS — R300 Dysuria: Secondary | ICD-10-CM | POA: Insufficient documentation

## 2014-12-25 ENCOUNTER — Telehealth: Payer: Self-pay

## 2014-12-25 NOTE — Telephone Encounter (Signed)
-----   Message from Nori Riis, PA-C sent at 12/24/2014  7:22 PM EST ----- Please tell patient that she has a positive urine culture and to continue the Septra DS. We will need a catheter specimen 3-5 days after finishing her antibiotic to make sure the infection has cleared.

## 2014-12-25 NOTE — Telephone Encounter (Signed)
Spoke with pt in reference to -ucx. Pt voiced understanding.  

## 2014-12-26 ENCOUNTER — Telehealth: Payer: Self-pay | Admitting: Urology

## 2014-12-26 NOTE — Telephone Encounter (Signed)
Pt called & cannot come in within the 3-5 day window.  She will be at work.  Pt is wanting to come in next Wed, 11/23.  Please call patient [phone].

## 2014-12-28 NOTE — Telephone Encounter (Signed)
Ok that will work. Add pt to [place] schedule.

## 2014-12-29 NOTE — Telephone Encounter (Signed)
Called patient and made appt for her to come in on 11/23 at 9:15.

## 2015-01-03 ENCOUNTER — Ambulatory Visit (INDEPENDENT_AMBULATORY_CARE_PROVIDER_SITE_OTHER): Admitting: Obstetrics and Gynecology

## 2015-01-03 ENCOUNTER — Encounter: Payer: Self-pay | Admitting: Obstetrics and Gynecology

## 2015-01-03 VITALS — BP 148/82 | HR 68 | Resp 16 | Ht 65.0 in | Wt 194.0 lb

## 2015-01-03 DIAGNOSIS — N390 Urinary tract infection, site not specified: Secondary | ICD-10-CM

## 2015-01-03 LAB — MICROSCOPIC EXAMINATION
RBC, UA: NONE SEEN /hpf (ref 0–?)
WBC, UA: NONE SEEN /hpf (ref 0–?)

## 2015-01-03 LAB — URINALYSIS, COMPLETE: BACTERIA UA: NONE SEEN

## 2015-01-03 NOTE — Patient Instructions (Signed)

## 2015-01-03 NOTE — Progress Notes (Signed)
01/03/2015 10:50 AM   Melissa Arroyo 22-Feb-1950 [id number]  Referring provider: Glendon Axe, MD [place] [facility] [place], [place] [address]  Chief Complaint  Patient presents with  . Recurrent UTI  . Follow-up    HPI: Patient is a 64 year old female with a history of recurrent urinary tract infections presenting today for a recheck. She states that she is currently asymptomatic. She denies fevers, dysuria, gross hematuria or flank pain. She states that she was asymptomatic when her previous urine culture was Patient reports that she is currently asymptomatic but states that she did not have any symptoms prior  and her urine culture was positive.  This indicates possible chronic colonization.   PMH: Past Medical History  Diagnosis Date  . Depression   . Hyperlipidemia   . Anxiety   . Heart attack ([place])   . Diabetes mellitus ([place])   . Obesity   . Vaginal atrophy   . Urinary incontinence in female   . Renal lesion   . Recurrent UTI   . Hypertension     Surgical History: Past Surgical History  Procedure Laterality Date  . Bunionectomy  2001  . Rotator cuff repair Left   . Neck surgery      Home Medications:    Medication List       This list is accurate as of: 01/03/15 10:50 AM.  Always use your most recent med list.               aspirin EC 81 MG tablet  Take by mouth.     clonazePAM 1 MG tablet  Commonly known as:  KLONOPIN  TAKE 1/2 TO 1 TABLET BY MOUTH TWICE A DAY     glipiZIDE 10 MG 24 hr tablet  Commonly known as:  GLUCOTROL XL  TAKE 1 TABLET (10 MG) BY ORAL ROUTE ONCE DAILY WITH BREAKFAST     lovastatin 40 MG tablet  Commonly known as:  MEVACOR  Take 40 mg by mouth at bedtime.     metFORMIN 500 MG 24 hr tablet  Commonly known as:  GLUCOPHAGE-XR  TAKE 2 TABLETS BY ORAL ROUTE 2 TIMES A DAY WITH MEALS FOR 90 DAYS     MYRBETRIQ 25 MG Tb24 tablet  Generic drug:  mirabegron ER  Take 25 mg by mouth daily.      OLANZapine-FLUoxetine 6-50 MG capsule  Commonly known as:  SYMBYAX  Take 1 capsule by mouth daily.     phenazopyridine 100 MG tablet  Commonly known as:  PYRIDIUM  Take 100 mg by mouth 3 (three) times daily as needed for pain.     phenazopyridine 200 MG tablet  Commonly known as:  PYRIDIUM  Take 1 tablet (200 mg total) by mouth 3 (three) times daily as needed for pain.        Allergies:  Allergies  Allergen Reactions  . Penicillins Rash  . Vesicare [Solifenacin] Anaphylaxis    Throat Closed    Family History: Family History  Problem Relation Age of Onset  . Hypertension Mother   . Cancer Mother     Luekemia  . Cancer      Bladder,Kidney,Prostate    Social History:  reports that she has never smoked. She does not have any smokeless tobacco history on file. She reports that she does not drink alcohol or use illicit drugs.  ROS: UROLOGY Frequent Urination?: No Hard to postpone urination?: No Burning/pain with urination?: No Get up at night to urinate?: No  Leakage of urine?: No Urine stream starts and stops?: No Trouble starting stream?: No Do you have to strain to urinate?: No Blood in urine?: No Urinary tract infection?: No Sexually transmitted disease?: No Injury to kidneys or bladder?: No Painful intercourse?: No Weak stream?: No Currently pregnant?: No Vaginal bleeding?: No Last menstrual period?: n  Gastrointestinal Nausea?: No Vomiting?: No Indigestion/heartburn?: No Diarrhea?: No Constipation?: No  Constitutional Fever: No Night sweats?: No Weight loss?: No Fatigue?: No  Skin Skin rash/lesions?: No Itching?: No  Eyes Blurred vision?: No Double vision?: No  Ears/Nose/Throat Sore throat?: No Sinus problems?: No  Hematologic/Lymphatic Swollen glands?: No Easy bruising?: No  Cardiovascular Leg swelling?: No Chest pain?: No  Respiratory Cough?: No Shortness of breath?: No  Endocrine Excessive thirst?:  No  Musculoskeletal Back pain?: No Joint pain?: No  Neurological Headaches?: No Dizziness?: No  Psychologic Depression?: No Anxiety?: Yes  Physical Exam: BP 148/82 mmHg  Pulse 68  Resp 16  Ht 5\' 5"  (1.651 m)  Wt 194 lb (87.998 kg)  BMI 32.28 kg/m2  Constitutional:  Alert and oriented, No acute distress. HEENT: [place] AT, moist mucus membranes.  Trachea midline, no masses. Cardiovascular: No clubbing, cyanosis, or edema. Respiratory: Normal respiratory effort, no increased work of breathing. Skin: No rashes, bruises or suspicious lesions. Neurologic: Grossly intact, no focal deficits, moving all 4 extremities. Psychiatric: Normal mood and affect.  Laboratory Data:   Urinalysis  Pertinent Imaging:   Assessment & Plan:    1. Recurrent UTI- patient's UA unremarkable today. She is currently asymptomatic. I recommended that she begin taking her cranberry supplements on a regular basis and continue her estrogen replacement therapy for UTI prevention. Patient reports that she only drinks 2 bottles of water per day I encouraged her to increase her fluids significantly to flush her urinary tract. I discussed the possibility of chronic bacterial colonization of the her urinary tract. She understands that we should only treat if she is symptomatic. UTI prevention strategies discussed.  Good perineal hygiene reviewed. Patient is encouraged to increase daily water intake, start cranberry supplements to prevent invasive colonization along the urinary tract and probiotics, especially lactobacillus to restore normal vaginal flora. - Urinalysis, Complete   Return if symptoms worsen or fail to improve.  These notes generated with voice recognition software. I apologize for typographical errors.  Herbert Moors, [facility] [address], [place], [place] [address] [phone]

## 2015-02-14 DIAGNOSIS — I10 Essential (primary) hypertension: Secondary | ICD-10-CM | POA: Insufficient documentation

## 2015-02-15 ENCOUNTER — Encounter: Payer: Self-pay | Admitting: Physician Assistant

## 2015-02-15 ENCOUNTER — Ambulatory Visit (INDEPENDENT_AMBULATORY_CARE_PROVIDER_SITE_OTHER): Admitting: Physician Assistant

## 2015-02-15 VITALS — BP 138/60 | HR 82 | Temp 97.9°F | Resp 16 | Ht 65.0 in | Wt 201.6 lb

## 2015-02-15 DIAGNOSIS — Z1211 Encounter for screening for malignant neoplasm of colon: Secondary | ICD-10-CM | POA: Diagnosis not present

## 2015-02-15 DIAGNOSIS — E119 Type 2 diabetes mellitus without complications: Secondary | ICD-10-CM

## 2015-02-15 DIAGNOSIS — N390 Urinary tract infection, site not specified: Secondary | ICD-10-CM

## 2015-02-15 DIAGNOSIS — J309 Allergic rhinitis, unspecified: Secondary | ICD-10-CM | POA: Diagnosis not present

## 2015-02-15 DIAGNOSIS — Z136 Encounter for screening for cardiovascular disorders: Secondary | ICD-10-CM | POA: Diagnosis not present

## 2015-02-15 DIAGNOSIS — Z13220 Encounter for screening for lipoid disorders: Secondary | ICD-10-CM | POA: Diagnosis not present

## 2015-02-15 DIAGNOSIS — F419 Anxiety disorder, unspecified: Secondary | ICD-10-CM | POA: Diagnosis not present

## 2015-02-15 LAB — POCT URINALYSIS DIPSTICK
BILIRUBIN UA: NEGATIVE
GLUCOSE UA: NEGATIVE
Ketones, UA: NEGATIVE
Leukocytes, UA: NEGATIVE
NITRITE UA: NEGATIVE
Protein, UA: NEGATIVE
RBC UA: NEGATIVE
Spec Grav, UA: 1.02
Urobilinogen, UA: 0.2
pH, UA: 6

## 2015-02-15 MED ORDER — SULFAMETHOXAZOLE-TRIMETHOPRIM 800-160 MG PO TABS: 1.0000 | ORAL_TABLET | Freq: Two times a day (BID) | ORAL | Status: DC

## 2015-02-15 MED ORDER — MONTELUKAST SODIUM 10 MG PO TABS: 10.0000 mg | ORAL_TABLET | Freq: Every day | ORAL | Status: DC

## 2015-02-15 MED ORDER — CLONAZEPAM 1 MG PO TABS: 1.0000 mg | ORAL_TABLET | Freq: Every day | ORAL | Status: DC

## 2015-02-15 MED ORDER — FLUTICASONE PROPIONATE 50 MCG/ACT NA SUSP: 2.0000 | Freq: Every day | NASAL | Status: DC

## 2015-02-15 NOTE — Progress Notes (Signed)
Patient: Melissa Arroyo, Female    DOB: 07-14-1950, 65 y.o.   MRN: [id number] Visit Date: 02/15/2015  Today's Provider: Mar Daring, PA-C   Chief Complaint  Patient presents with  . Establish Care   Subjective:  Establish Care:  Melissa Arroyo is a 65 y.o. female who presents today to establish care as a new patient. She feels poorly-have been sick for the past 6 weeks . Hard to breath, panic attacks, UTI.Per patient last pap and mammogram was 2013. Patient saw Dr. Nehemiah Massed yesterday as a new appointment. Per patient has a stress test appointment the 67 th of January.  Anxiety: Patient complains of panic attacks.  She has the following symptoms: chest pain, difficulty concentrating, fatigue, feelings of losing control, palpitations, shortness of breath. Onset of symptoms was approximately 3 months ago, unchanged since that time. She denies current suicidal and homicidal ideation. Did have suicidal ideas in the past.Family history significant for alcoholism and Bipolar Disease.Possible organic causes contributing are: none. Risk factors: previous episode of depression Previous treatment includes Ativan, Lexapro, Paxil, Wellbutrin and Xanax and group therapy.  She complains of the following side effects from the treatment: none with the Clonazepam.  Urinary Tract Infection: Patient complains of burning with urination, dysuria, frequency and urgency She has had symptoms for 4 days. Patient also complains of congestion, cough, headache and  a little of this symptoms are getting better.. Patient denies back pain and vaginal discharge. Patient does have a history of recurrent UTI.  Per patient sees the Dr. Tonye Pearson for the UTI's.    Review of Systems  Constitutional: Negative.   HENT: Positive for sinus pressure and sneezing.   Eyes: Negative.   Respiratory: Positive for chest tightness and shortness of breath. Negative for cough and wheezing.   Cardiovascular: Negative for  chest pain, palpitations and leg swelling.  Gastrointestinal: Negative.   Endocrine: Negative.   Genitourinary: Positive for dysuria. Negative for urgency, frequency, hematuria, flank pain and vaginal discharge.  Musculoskeletal: Negative.   Skin: Negative.   Allergic/Immunologic: Positive for environmental allergies.  Neurological: Negative.   Hematological: Negative.   Psychiatric/Behavioral: Positive for dysphoric mood. The patient is nervous/anxious (Panic Attacks).     Social History      She  reports that she has never smoked. She does not have any smokeless tobacco history on file. She reports that she does not drink alcohol or use illicit drugs.       Social History   Social History  . Marital Status: Widowed    Spouse Name: N/A  . Number of Children: N/A  . Years of Education: N/A   Social History Main Topics  . Smoking status: Never Smoker   . Smokeless tobacco: None  . Alcohol Use: No     Comment: Quit in 04/24/1993  . Drug Use: No  . Sexual Activity: Not Asked   Other Topics Concern  . None   Social History Narrative    Past Medical History  Diagnosis Date  . Depression   . Hyperlipidemia   . Anxiety   . Diabetes mellitus ([place])   . Obesity   . Vaginal atrophy   . Urinary incontinence in female   . Renal lesion   . Recurrent UTI   . Hypertension   . Heart attack ([place]) 04/2007  . Heart attack [facility])      Patient Active Problem List   Diagnosis Date Noted  . Benign essential HTN 02/14/2015  .  Dysuria 12/24/2014  . Recurrent UTI 09/28/2014  . Dyspareunia 09/28/2014  . Atrophic vaginitis 09/28/2014  . Urge incontinence 09/28/2014  . Renal cyst, left 07/24/2014  . Microscopic hematuria 07/24/2014  . Headache, tension-type 07/24/2014  . Combined fat and carbohydrate induced hyperlipemia 07/24/2014  . Diabetes mellitus, type 2 ([place]) 07/24/2014    Past Surgical History  Procedure Laterality Date  . Bunionectomy  2001  . Rotator cuff repair  Left   . Neck surgery      Family History        Family Status  Relation Status Death Age  . Mother Deceased 85  . Father Deceased 41        Her family history includes Cancer in her mother; Hypertension in her mother.    Allergies  Allergen Reactions  . Penicillins Rash  . Vesicare [Solifenacin] Anaphylaxis    Throat Closed    Previous Medications   ASPIRIN EC 81 MG TABLET    Take by mouth.   CLONAZEPAM (KLONOPIN) 1 MG TABLET    TAKE 1/2 TO 1 TABLET BY MOUTH TWICE A DAY   GLIPIZIDE (GLUCOTROL XL) 10 MG 24 HR TABLET    TAKE 1 TABLET (10 MG) BY ORAL ROUTE ONCE DAILY WITH BREAKFAST   LOVASTATIN (MEVACOR) 40 MG TABLET    Take 40 mg by mouth at bedtime.   METFORMIN (GLUCOPHAGE-XR) 500 MG 24 HR TABLET    TAKE 2 TABLETS BY ORAL ROUTE 2 TIMES A DAY WITH MEALS FOR 90 DAYS   MYRBETRIQ 25 MG TB24 TABLET    Take 25 mg by mouth daily.   NITROGLYCERIN (NITROSTAT) 0.4 MG SL TABLET    Place under the tongue.   OLANZAPINE-FLUOXETINE (SYMBYAX) 6-50 MG PER CAPSULE    Take 1 capsule by mouth daily.   PHENAZOPYRIDINE (PYRIDIUM) 100 MG TABLET    Take 100 mg by mouth 3 (three) times daily as needed for pain.   PROPRANOLOL (INDERAL) 10 MG TABLET    Take by mouth.   SULFAMETHOXAZOLE-TRIMETHOPRIM (BACTRIM DS,SEPTRA DS) 800-160 MG TABLET    Reported on 02/15/2015    Patient Care Team: Mar Daring, PA-C as PCP - General (Family Medicine)     Objective:   Vitals: BP 138/60 mmHg  Pulse 82  Temp(Src) 97.9 F (36.6 C) (Oral)  Resp 16  Ht 5\' 5"  (1.651 m)  Wt 201 lb 9.6 oz (91.445 kg)  BMI 33.55 kg/m2  SpO2 98%   Physical Exam  Constitutional: She is oriented to person, place, and time. She appears well-developed and well-nourished. No distress.  HENT:  Head: Normocephalic and atraumatic.  Right Ear: External ear normal.  Left Ear: External ear normal.  Nose: Nose normal.  Mouth/Throat: Oropharynx is clear and moist. No oropharyngeal exudate.  Eyes: Conjunctivae and EOM are normal.  Pupils are equal, round, and reactive to light. Right eye exhibits no discharge. Left eye exhibits no discharge. No scleral icterus.  Neck: Normal range of motion. Neck supple. No JVD present. No tracheal deviation present. No thyromegaly present.  Cardiovascular: Normal rate, regular rhythm, normal heart sounds and intact distal pulses.  Exam reveals no gallop and no friction rub.   No murmur heard. Pulmonary/Chest: Effort normal and breath sounds normal. No respiratory distress. She has no wheezes. She has no rales. She exhibits no tenderness.  Abdominal: Soft. Bowel sounds are normal. She exhibits no distension and no mass. There is no tenderness. There is no rebound and no guarding.  Musculoskeletal: Normal range of motion.  She exhibits no edema or tenderness.  Lymphadenopathy:    She has no cervical adenopathy.  Neurological: She is alert and oriented to person, place, and time.  Skin: Skin is warm and dry. No rash noted. She is not diaphoretic.  Psychiatric: She has a normal mood and affect. Her behavior is normal. Judgment and thought content normal.  Vitals reviewed.    Depression Screen No flowsheet data found.    Assessment & Plan:     Routine Health Maintenance and Physical Exam  1. Urinary tract infection without hematuria, site unspecified  UA today in the office was negative for UTI. She states she has had this happen before and when it was sent for culture did come back with Escherichia coli. I will go ahead and send the urine for culture and give her prescription for Bactrim as below. I will adjust antibiotic therapy pending the results of the culture and sensitivities. She states that this is her 10th urinary tract infection in a year. I did discuss with her that once we treat this UTI that it is possible for [place] to put her on a low-dose Macrobid to be taken daily to prevent UTIs. I will see her back in 6 months for we can discuss this further. She is to call the office if  she has any worsening symptoms, acute issues , questions or concerns in the meantime. - POCT urinalysis dipstick - Urine Culture - sulfamethoxazole-trimethoprim (BACTRIM DS,SEPTRA DS) 800-160 MG tablet; Take 1 tablet by mouth 2 (two) times daily.  Dispense: 20 tablet; Refill: 0  2. Allergic rhinitis, unspecified allergic rhinitis type She has been noticing having increased nasal drainage, sneezing, watery eyes that occurs spontaneously while she is at work. She does that she must be allergic to something at work but she has not 100% sure of what it is. I will give Flonase and Singulair as below to hopefully help with the runny nose and shortness of breath that developed during these episodes. I will see her back in 6 months to see how she is doing with this treatment. She is to call the office if she has any worsening symptoms, acute issues, questions or concerns in the meantime. - fluticasone (FLONASE) 50 MCG/ACT nasal spray; Place 2 sprays into both nostrils daily.  Dispense: 16 g; Refill: 6 - montelukast (SINGULAIR) 10 MG tablet; Take 1 tablet (10 mg total) by mouth at bedtime.  Dispense: 30 tablet; Refill: 3  3. Colon cancer screening She states that she has never had a colonoscopy and does not wish to have one however she is interested in using the cold guard. She states her mother does have a history of having precancerous polyps and she knows that she should get tested but just does not like the idea of a colonoscopy. I did give her a pamphlet about: Guarded and we'll order it as below. I will notify her of the results once I receive them. - Cologuard  4. Acute anxiety  has been currently stable with clonazepam. She mostly takes clonazepam at night to help her sleep. I advised that she may increase and take half a tab during the day when she has the onset of a panic attack to see if this helps her symptoms. I will prescribe this as below. She is to call the office if symptoms do not improve,  worsen or if she has any questions or concerns. I will see her back in 6 months to see how she is doing with  this transition. - clonazePAM (KLONOPIN) 1 MG tablet; Take 1 tablet (1 mg total) by mouth at bedtime. May take 1/2 tab q 8 hrs during the day for panic attacks if needed.  Dispense: 60 tablet; Refill: 5 - Comprehensive Metabolic Panel (CMET); Future  5. Type 2 diabetes mellitus without complication, without long-term current use of insulin (HCC)  she states that she has been stable on her current dose of metformin for her diabetes. I will check labs as below. Her most recent labs were done in August 2016 by Dr. Candiss Norse. I will give her a lab slip so that she may get her labs in February which will be 6 months from the date that they were last checked. I will follow-up with her pending these results once I receive them. If needed we will schedule an office visit to discuss any changes that may be necessary. If labs are stable we will keep six-month follow-up. She is to call the office if she has any worsening symptoms, acute issues, questions or concerns. - HgB A1c; Future - CBC With Differential; Future  6. Encounter for lipid screening for cardiovascular disease  currently stable on lovastatin 40 mg. I will check cholesterol as below. This will be done in February with her other labs. She is to call the office if she has any acute issues, questions or concerns in the meantime. - Lipid panel; Future   Exercise Activities and Dietary recommendations Goals    None       There is no immunization history on file for this patient.  Health Maintenance  Topic Date Due  . HEMOGLOBIN A1C  07-09-1950  . Hepatitis C Screening  [birth year]  . PNEUMOCOCCAL POLYSACCHARIDE VACCINE (1) 01/24/1954  . OPHTHALMOLOGY EXAM  01/24/1962  . URINE MICROALBUMIN  01/24/1962  . HIV Screening  01/25/1967  . TETANUS/TDAP  01/25/1971  . PAP SMEAR  01/24/1973  . MAMMOGRAM  01/24/2002  . COLONOSCOPY  01/24/2002   . ZOSTAVAX  01/25/2012  . INFLUENZA VACCINE  09/11/2014  . FOOT EXAM  10/05/2015      Discussed health benefits of physical activity, and encouraged her to engage in regular exercise appropriate for her age and condition.    --------------------------------------------------------------------

## 2015-02-15 NOTE — Patient Instructions (Signed)

## 2015-02-18 LAB — URINE CULTURE

## 2015-02-20 ENCOUNTER — Telehealth: Payer: Self-pay | Admitting: Physician Assistant

## 2015-02-20 NOTE — Telephone Encounter (Signed)
-----   Message from Mar Daring, PA-C sent at 02/20/2015  8:23 AM EST ----- Urine culture grew out E.Coli but is susceptible to the antibiotic you were prescribed. Continue until completed.

## 2015-02-20 NOTE — Telephone Encounter (Signed)
Patient advised as directed below. Patient voiced understanding. Also patient states that the pharmacist didn't give her the Montelukast and wanted to make sure Tawanna Sat had sent the medication over before asking them . Advised patient that Both prescription were done at the same time and it showed received by the pharmacy.  Thanks,  -Joseline

## 2015-02-20 NOTE — Telephone Encounter (Signed)
Pt said she talked to you about an allergy medication the last time she was in that she thought you were going to call in.  She uses [address].  She also needs test results from [facility].  Her call back is [phone]  [facility]

## 2015-03-22 ENCOUNTER — Other Ambulatory Visit: Payer: Self-pay

## 2015-03-22 DIAGNOSIS — E119 Type 2 diabetes mellitus without complications: Secondary | ICD-10-CM

## 2015-03-22 DIAGNOSIS — F419 Anxiety disorder, unspecified: Secondary | ICD-10-CM

## 2015-03-22 DIAGNOSIS — Z136 Encounter for screening for cardiovascular disorders: Secondary | ICD-10-CM

## 2015-03-22 DIAGNOSIS — Z13220 Encounter for screening for lipoid disorders: Secondary | ICD-10-CM

## 2015-03-23 ENCOUNTER — Telehealth: Payer: Self-pay

## 2015-03-23 LAB — LIPID PANEL
CHOLESTEROL TOTAL: 165 mg/dL (ref 100–199)
Chol/HDL Ratio: 2.9 ratio units (ref 0.0–4.4)
HDL: 57 mg/dL (ref >39)
LDL Calculated: 78 mg/dL (ref 0–99)
Triglycerides: 151 mg/dL — ABNORMAL HIGH (ref 0–149)
VLDL Cholesterol Cal: 30 mg/dL (ref 5–40)

## 2015-03-23 LAB — CBC WITH DIFFERENTIAL
BASOS ABS: 0 10*3/uL (ref 0.0–0.2)
Basos: 1 %
EOS (ABSOLUTE): 0.3 10*3/uL (ref 0.0–0.4)
Eos: 3 %
HEMOGLOBIN: 12.7 g/dL (ref 11.1–15.9)
Hematocrit: 39.7 % (ref 34.0–46.6)
IMMATURE GRANS (ABS): 0 10*3/uL (ref 0.0–0.1)
IMMATURE GRANULOCYTES: 0 %
LYMPHS: 35 %
Lymphocytes Absolute: 2.9 10*3/uL (ref 0.7–3.1)
MCH: 30.3 pg (ref 26.6–33.0)
MCHC: 32 g/dL (ref 31.5–35.7)
MCV: 95 fL (ref 79–97)
MONOCYTES: 6 %
Monocytes Absolute: 0.5 10*3/uL (ref 0.1–0.9)
NEUTROS ABS: 4.5 10*3/uL (ref 1.4–7.0)
Neutrophils: 55 %
RBC: 4.19 x10E6/uL (ref 3.77–5.28)
RDW: 13.7 % (ref 12.3–15.4)
WBC: 8.1 10*3/uL (ref 3.4–10.8)

## 2015-03-23 LAB — COMPREHENSIVE METABOLIC PANEL
ALBUMIN: 4.2 g/dL (ref 3.6–4.8)
ALT: 27 IU/L (ref 0–32)
AST: 20 IU/L (ref 0–40)
Albumin/Globulin Ratio: 1.6 (ref 1.1–2.5)
Alkaline Phosphatase: 81 IU/L (ref 39–117)
BUN / CREAT RATIO: 20 (ref 11–26)
BUN: 19 mg/dL (ref 8–27)
Bilirubin Total: 0.2 mg/dL (ref 0.0–1.2)
CALCIUM: 9.8 mg/dL (ref 8.7–10.3)
CO2: 25 mmol/L (ref 18–29)
CREATININE: 0.95 mg/dL (ref 0.57–1.00)
Chloride: 99 mmol/L (ref 96–106)
GFR, EST AFRICAN AMERICAN: 74 mL/min/{1.73_m2} (ref >59)
GFR, EST NON AFRICAN AMERICAN: 64 mL/min/{1.73_m2} (ref >59)
GLUCOSE: 184 mg/dL — AB (ref 65–99)
Globulin, Total: 2.6 g/dL (ref 1.5–4.5)
Potassium: 5 mmol/L (ref 3.5–5.2)
Sodium: 138 mmol/L (ref 134–144)
TOTAL PROTEIN: 6.8 g/dL (ref 6.0–8.5)

## 2015-03-23 LAB — HEMOGLOBIN A1C
Est. average glucose Bld gHb Est-mCnc: 163 mg/dL
Hgb A1c MFr Bld: 7.3 % — ABNORMAL HIGH (ref 4.8–5.6)

## 2015-03-23 NOTE — Telephone Encounter (Signed)
LMTCB  Thanks,  -Joseline 

## 2015-03-23 NOTE — Telephone Encounter (Signed)
Pt is returning call.  [phone]

## 2015-03-23 NOTE — Telephone Encounter (Signed)
-----   Message from Mar Daring, [place] sent at 03/23/2015  8:36 AM EST ----- All labs are within normal limits and stable. HgBA1c is 7.3. Thanks! -JB

## 2015-03-23 NOTE — Telephone Encounter (Signed)
Patient advised as directed below.  Thanks,  -Davin Muramoto 

## 2015-04-03 ENCOUNTER — Telehealth: Payer: Self-pay | Admitting: Physician Assistant

## 2015-04-03 ENCOUNTER — Other Ambulatory Visit: Payer: Self-pay | Admitting: Physician Assistant

## 2015-04-03 DIAGNOSIS — F329 Major depressive disorder, single episode, unspecified: Secondary | ICD-10-CM

## 2015-04-03 DIAGNOSIS — F32A Depression, unspecified: Secondary | ICD-10-CM

## 2015-04-03 MED ORDER — OLANZAPINE-FLUOXETINE HCL 6-50 MG PO CAPS: 1.0000 | ORAL_CAPSULE | Freq: Every day | ORAL | Status: DC

## 2015-04-03 NOTE — Telephone Encounter (Signed)
I see you already sent it.

## 2015-04-03 NOTE — Telephone Encounter (Signed)
[place] to verify the prescription that [facility] had sent to them this morning at 8:41 am for the Onlanzapine-Fluoxetine and the pharmacist said they did received the prescription and they needed to order it that they didn't have there.   Called the patient to informed her that the prescription was received this morning by the pharmacy and they order the medication. Per patient thank you and she apologizes for not understanding the pharmacist.  Thanks,  -Jeanet Lupe

## 2015-04-03 NOTE — Telephone Encounter (Signed)
Pt is returning call.  [phone]

## 2015-04-03 NOTE — Telephone Encounter (Signed)
Pt called back. She has called [facility].  They do not have a refill/  Dr. Rosine Door use to prescribe this for her.  She said you told her you would take over prescribing this medication.  Pt call back is [phone]  Thanks Con Memos

## 2015-04-03 NOTE — Telephone Encounter (Signed)
Pt needs new refill on OLANZapine-FLUoxetine (SYMBYAX) 6-50 MG capsule  She uses [facility] [address]  Call back [phone]  Thanks Con Memos

## 2015-05-24 ENCOUNTER — Telehealth: Payer: Self-pay | Admitting: Physician Assistant

## 2015-05-24 NOTE — Telephone Encounter (Signed)
Ok great.

## 2015-05-24 NOTE — Telephone Encounter (Signed)
Patient called office to inform Tawanna Sat that she has discontinued Mybetriq 25mg  two months ago. Patient states that since stopping medication she has not had anymore shortness of breath or symptoms of anxiety. KW

## 2015-06-04 ENCOUNTER — Other Ambulatory Visit: Payer: Self-pay | Admitting: Physician Assistant

## 2015-06-04 DIAGNOSIS — E119 Type 2 diabetes mellitus without complications: Secondary | ICD-10-CM

## 2015-06-04 MED ORDER — GLIPIZIDE ER 10 MG PO TB24: ORAL_TABLET | ORAL | Status: DC

## 2015-06-23 ENCOUNTER — Other Ambulatory Visit: Payer: Self-pay | Admitting: Physician Assistant

## 2015-06-23 DIAGNOSIS — J302 Other seasonal allergic rhinitis: Secondary | ICD-10-CM

## 2015-06-25 ENCOUNTER — Other Ambulatory Visit: Payer: Self-pay | Admitting: Physician Assistant

## 2015-06-27 ENCOUNTER — Ambulatory Visit: Admitting: Physician Assistant

## 2015-07-02 ENCOUNTER — Telehealth: Payer: Self-pay | Admitting: Physician Assistant

## 2015-07-02 NOTE — Telephone Encounter (Signed)
Please Advise.  Thanks,  -Kare Dado

## 2015-07-02 NOTE — Telephone Encounter (Signed)
Levada Dy With [employer] stating the insurance had requested records on pt and received them but did not receive the Colonoscopy results. Glenard Haring states that in the office notes from 02-15-15 it states in the notes that the pt was going to have a colonoscopy done.  CB# [phone] Thanks CC

## 2015-07-03 ENCOUNTER — Telehealth: Payer: Self-pay | Admitting: Physician Assistant

## 2015-07-03 NOTE — Telephone Encounter (Signed)
Thank you so much! Will send [employer] results once received.

## 2015-07-03 NOTE — Telephone Encounter (Signed)
Spoke with patient. We will fill out form for Cologuard and fax the form back and put order in. Tawanna Sat is advised and Parke Poisson. FYI: Informed Angela with [employer] also.  Thanks,  -Ennifer [place]

## 2015-07-03 NOTE — Telephone Encounter (Signed)
I dont see it in chart. Can we call her to see if she had it done? Thanks!

## 2015-07-03 NOTE — Telephone Encounter (Signed)
Order for cologuard faxed to [facility] °

## 2015-07-11 ENCOUNTER — Encounter: Payer: Self-pay | Admitting: Physician Assistant

## 2015-07-11 ENCOUNTER — Ambulatory Visit (INDEPENDENT_AMBULATORY_CARE_PROVIDER_SITE_OTHER): Admitting: Physician Assistant

## 2015-07-11 VITALS — BP 142/70 | HR 84 | Temp 97.8°F | Resp 16

## 2015-07-11 DIAGNOSIS — H6983 Other specified disorders of Eustachian tube, bilateral: Secondary | ICD-10-CM

## 2015-07-11 DIAGNOSIS — N390 Urinary tract infection, site not specified: Secondary | ICD-10-CM

## 2015-07-11 LAB — POCT URINALYSIS DIPSTICK
Bilirubin, UA: NEGATIVE
Blood, UA: NEGATIVE
Glucose, UA: NEGATIVE
Ketones, UA: NEGATIVE
Nitrite, UA: NEGATIVE
Protein, UA: NEGATIVE
Spec Grav, UA: 1.01
Urobilinogen, UA: 0.2
pH, UA: 6

## 2015-07-11 MED ORDER — AZELASTINE-FLUTICASONE 137-50 MCG/ACT NA SUSP: 1.0000 | Freq: Two times a day (BID) | NASAL | Status: DC

## 2015-07-11 NOTE — Progress Notes (Signed)
Patient ID: Melissa Arroyo, female   DOB: 1950-07-15, 65 y.o.   MRN: [id number]       Patient: Melissa Arroyo Female    DOB: 12-06-1950   65 y.o.   MRN: [id number] Visit Date: 07/11/2015  Today's Provider: Mar Daring, PA-C   Chief Complaint  Patient presents with  . Ear Fullness   Subjective:    HPI Patient c/o bilateral ear fullness X 10 days. She reports that she has been taking OTC Claritin-D and tylenol for her symptoms. She reports that she has an appt scheduled with ENT (Dr. Pryor Ochoa) on 07/26/15, but she can not wait that long to be seen. She has also been using Afrin which has not helped. She is unable to pop her ears when applying pressure with nose closed. "Feels like it could, I feel the pressure build, but no relief." She has no other URI symptoms. She does report increased stress due to work.     Allergies  Allergen Reactions  . Penicillins Rash  . Vesicare [Solifenacin] Anaphylaxis    Throat Closed   Previous Medications   ASPIRIN EC 81 MG TABLET    Take by mouth.   CLONAZEPAM (KLONOPIN) 1 MG TABLET    Take 1 tablet (1 mg total) by mouth at bedtime. May take 1/2 tab q 8 hrs during the day for panic attacks if needed.   FLUTICASONE (FLONASE) 50 MCG/ACT NASAL SPRAY    Place 2 sprays into both nostrils daily.   GLIPIZIDE (GLUCOTROL XL) 10 MG 24 HR TABLET    TAKE 1 TABLET (10 MG) BY ORAL ROUTE ONCE DAILY WITH BREAKFAST   LOVASTATIN (MEVACOR) 40 MG TABLET    Take 40 mg by mouth at bedtime.   METFORMIN (GLUCOPHAGE-XR) 500 MG 24 HR TABLET    TAKE 2 TABLETS BY ORAL ROUTE 2 TIMES A DAY WITH MEALS FOR 90 DAYS   MONTELUKAST (SINGULAIR) 10 MG TABLET    TAKE ONE TABLET BY MOUTH AT BEDTIME   MYRBETRIQ 25 MG TB24 TABLET    Take 25 mg by mouth daily.   NITROGLYCERIN (NITROSTAT) 0.4 MG SL TABLET    Place under the tongue.   OLANZAPINE-FLUOXETINE (SYMBYAX) 6-50 MG CAPSULE    Take 1 capsule by mouth daily.   PHENAZOPYRIDINE (PYRIDIUM) 100 MG TABLET    Take 100 mg by mouth 3  (three) times daily as needed for pain. Reported on 07/11/2015   PROPRANOLOL (INDERAL) 10 MG TABLET    Take 5 mg by mouth 2 (two) times daily.    SULFAMETHOXAZOLE-TRIMETHOPRIM (BACTRIM DS,SEPTRA DS) 800-160 MG TABLET    Take 1 tablet by mouth 2 (two) times daily.    Review of Systems  Constitutional: Positive for fatigue. Negative for fever, chills, activity change, appetite change and unexpected weight change.  HENT: Positive for congestion, postnasal drip and sinus pressure.        Ear fullness  Respiratory: Negative.   Genitourinary: Negative.   Psychiatric/Behavioral: The patient is nervous/anxious.     Social History  Substance Use Topics  . Smoking status: Never Smoker   . Smokeless tobacco: Not on file  . Alcohol Use: No     Comment: Quit in 04/24/1993   Objective:   BP 142/70 mmHg  Pulse 84  Temp(Src) 97.8 F (36.6 C)  Resp 16  Wt   SpO2 96%  Physical Exam  Constitutional: She appears well-developed and well-nourished. No distress.  HENT:  Head: Normocephalic and atraumatic.  Right Ear: Hearing, external  ear and ear canal normal. Tympanic membrane is not erythematous and not bulging. A middle ear effusion is present.  Left Ear: Hearing, external ear and ear canal normal. Tympanic membrane is not erythematous and not bulging. A middle ear effusion is present.  Nose: Nose normal. Right sinus exhibits no maxillary sinus tenderness and no frontal sinus tenderness. Left sinus exhibits no maxillary sinus tenderness and no frontal sinus tenderness.  Mouth/Throat: Uvula is midline, oropharynx is clear and moist and mucous membranes are normal. No oropharyngeal exudate, posterior oropharyngeal edema or posterior oropharyngeal erythema.  Eyes: Conjunctivae are normal. Pupils are equal, round, and reactive to light. Right eye exhibits no discharge. Left eye exhibits no discharge. No scleral icterus.  Neck: Normal range of motion. Neck supple. No tracheal deviation present. No  thyromegaly present.  Cardiovascular: Normal rate, regular rhythm and normal heart sounds.  Exam reveals no gallop and no friction rub.   No murmur heard. Pulmonary/Chest: Effort normal and breath sounds normal. No stridor. No respiratory distress. She has no wheezes. She has no rales.  Lymphadenopathy:    She has no cervical adenopathy.  Skin: Skin is warm and dry. She is not diaphoretic.  Vitals reviewed.       Assessment & Plan:     1. ETD (eustachian tube dysfunction), bilateral Will have her discontinue Afrin, continue Claritin-D in the morning, will add Dymista nasal spray (sample given) for her to use 1 spray twice daily, and will add Benadryl at bedtime. She is to call if symptoms worsen. She is to keep her follow-up with Dr. Pryor Ochoa on 07/26/2015.  2. Recurrent UTI UA was negative. - POCT Urinalysis Dipstick       Mar Daring, PA-C  [facility]

## 2015-07-11 NOTE — Patient Instructions (Signed)

## 2015-07-17 LAB — COLOGUARD
COLOGUARD: NEGATIVE
Cologuard: NEGATIVE
Cologuard: NEGATIVE

## 2015-07-30 ENCOUNTER — Other Ambulatory Visit: Payer: Self-pay | Admitting: Physician Assistant

## 2015-07-30 DIAGNOSIS — E119 Type 2 diabetes mellitus without complications: Secondary | ICD-10-CM

## 2015-07-30 MED ORDER — METFORMIN HCL ER (OSM) 1000 MG PO TB24: 1000.0000 mg | ORAL_TABLET | Freq: Two times a day (BID) | ORAL | Status: DC

## 2015-07-31 ENCOUNTER — Telehealth: Payer: Self-pay | Admitting: Physician Assistant

## 2015-07-31 ENCOUNTER — Telehealth: Payer: Self-pay

## 2015-07-31 LAB — COLOGUARD: COLOGUARD: NEGATIVE

## 2015-07-31 NOTE — Telephone Encounter (Signed)
Pt states she is returning a call, please call pt back @ [phone].  Thanks CC

## 2015-07-31 NOTE — Telephone Encounter (Signed)
LMTCB-Re: her cologuard results-Negative  Thanks,  -Drequan Ironside

## 2015-07-31 NOTE — Telephone Encounter (Signed)
Patient advised of cologuard results.  Thanks,  -Senie Lanese

## 2015-08-06 ENCOUNTER — Other Ambulatory Visit: Payer: Self-pay | Admitting: Physician Assistant

## 2015-08-06 DIAGNOSIS — E119 Type 2 diabetes mellitus without complications: Secondary | ICD-10-CM

## 2015-08-06 MED ORDER — METFORMIN HCL ER 500 MG PO TB24: 500.0000 mg | ORAL_TABLET | Freq: Every day | ORAL | Status: DC

## 2015-08-09 ENCOUNTER — Ambulatory Visit: Admitting: Urology

## 2015-08-29 ENCOUNTER — Ambulatory Visit: Admitting: Physician Assistant

## 2015-09-05 ENCOUNTER — Other Ambulatory Visit: Payer: Self-pay | Admitting: Physician Assistant

## 2015-09-05 ENCOUNTER — Telehealth: Payer: Self-pay | Admitting: Physician Assistant

## 2015-09-05 DIAGNOSIS — E119 Type 2 diabetes mellitus without complications: Secondary | ICD-10-CM

## 2015-09-05 DIAGNOSIS — E7800 Pure hypercholesterolemia, unspecified: Secondary | ICD-10-CM

## 2015-09-05 MED ORDER — METFORMIN HCL ER 500 MG PO TB24: 500.0000 mg | ORAL_TABLET | Freq: Two times a day (BID) | ORAL | 3 refills | Status: DC

## 2015-09-05 MED ORDER — METFORMIN HCL 1000 MG PO TABS: 1000.0000 mg | ORAL_TABLET | Freq: Two times a day (BID) | ORAL | 1 refills | Status: DC

## 2015-09-05 NOTE — Telephone Encounter (Signed)
Pt is requesting a lab slip to have A1C and cholesterol rechecked.  CB#[phone]  Pt states the Rx for metformin is incorrect.  Pt states she should be taking 4 pills a day/2 pills in the morning and 2 pills in the evening/MW

## 2015-09-05 NOTE — Telephone Encounter (Signed)
Please review. Thanks!  

## 2015-09-05 NOTE — Telephone Encounter (Signed)
Metformin sent. 1000mg  sent BID. Lab order placed as well.

## 2015-09-13 ENCOUNTER — Telehealth: Payer: Self-pay

## 2015-09-13 LAB — LIPID PANEL
CHOL/HDL RATIO: 2.9 ratio (ref 0.0–4.4)
Cholesterol, Total: 155 mg/dL (ref 100–199)
HDL: 54 mg/dL (ref >39)
LDL CALC: 71 mg/dL (ref 0–99)
TRIGLYCERIDES: 152 mg/dL — AB (ref 0–149)
VLDL Cholesterol Cal: 30 mg/dL (ref 5–40)

## 2015-09-13 LAB — HEMOGLOBIN A1C
Est. average glucose Bld gHb Est-mCnc: 186 mg/dL
HEMOGLOBIN A1C: 8.1 % — AB (ref 4.8–5.6)

## 2015-09-13 MED ORDER — EMPAGLIFLOZIN 25 MG PO TABS: 25.0000 mg | ORAL_TABLET | Freq: Every day | ORAL | 0 refills | Status: DC

## 2015-09-13 NOTE — Telephone Encounter (Signed)
Patient advised as directed below. Voiced understanding.  Thanks,  -Artice Bergerson 

## 2015-09-13 NOTE — Addendum Note (Signed)
Addended by: Mar Daring on: 09/13/2015 11:20 AM   Modules accepted: Orders

## 2015-09-13 NOTE — Telephone Encounter (Signed)
-----   Message from Mar Daring, [place] sent at 09/13/2015 11:19 AM EDT ----- Cholesterol stable but sugar increased from 7.3 to 8.1. I do feel we need to add another agent on as well to better control sugar. I will send to pharmacy on file. Make sure to limit simple carbohydrates and sugars from diet. Will recheck in 3 months.

## 2015-09-27 ENCOUNTER — Encounter: Payer: Self-pay | Admitting: Physician Assistant

## 2015-09-27 ENCOUNTER — Ambulatory Visit (INDEPENDENT_AMBULATORY_CARE_PROVIDER_SITE_OTHER): Admitting: Physician Assistant

## 2015-09-27 VITALS — BP 130/76 | HR 62 | Temp 97.6°F | Wt 194.6 lb

## 2015-09-27 DIAGNOSIS — E119 Type 2 diabetes mellitus without complications: Secondary | ICD-10-CM

## 2015-09-27 DIAGNOSIS — I10 Essential (primary) hypertension: Secondary | ICD-10-CM | POA: Diagnosis not present

## 2015-09-27 DIAGNOSIS — E782 Mixed hyperlipidemia: Secondary | ICD-10-CM

## 2015-09-27 LAB — POCT UA - MICROALBUMIN: MICROALBUMIN (UR) POC: 50 mg/L

## 2015-09-27 MED ORDER — ACCU-CHEK FASTCLIX LANCETS MISC: 3 refills | Status: DC

## 2015-09-27 MED ORDER — GLUCOSE BLOOD VI STRP: ORAL_STRIP | 3 refills | Status: DC

## 2015-09-27 MED ORDER — ALCOHOL PREP PADS: MEDICATED_PAD | 3 refills | Status: DC

## 2015-09-27 NOTE — Patient Instructions (Addendum)
Diabetes and Standards of Medical Care Diabetes is complicated. You may find that your diabetes team includes a dietitian, nurse, diabetes educator, eye doctor, and more. To help everyone know what is going on and to help you get the care you deserve, the following schedule of care was developed to help keep you on track. Below are the tests, exams, vaccines, medicines, education, and plans you will need. HbA1c test This test shows how well you have controlled your glucose over the past 2-3 months. It is used to see if your diabetes management plan needs to be adjusted.   It is performed at least 2 times a year if you are meeting treatment goals.  It is performed 4 times a year if therapy has changed or if you are not meeting treatment goals. Blood pressure test  This test is performed at every routine medical visit. The goal is less than 140/90 mm Hg for most people, but 130/80 mm Hg in some cases. Ask your health care provider about your goal. Dental exam  Follow up with the dentist regularly. Eye exam  If you are diagnosed with type 1 diabetes as a child, get an exam upon reaching the age of 10 years or older and having had diabetes for 3-5 years. Yearly eye exams are recommended after that initial eye exam.  If you are diagnosed with type 1 diabetes as an adult, get an exam within 5 years of diagnosis and then yearly.  If you are diagnosed with type 2 diabetes, get an exam as soon as possible after the diagnosis and then yearly. Foot care exam  Visual foot exams are performed at every routine medical visit. The exams check for cuts, injuries, or other problems with the feet.  You should have a complete foot exam performed every year. This exam includes an inspection of the structure and skin of your feet, a check of the pulses in your feet, and a check of the sensation in your feet.  Type 1 diabetes: The first exam is performed 5 years after diagnosis.  Type 2 diabetes: The first  exam is performed at the time of diagnosis.  Check your feet nightly for cuts, injuries, or other problems with your feet. Tell your health care provider if anything is not healing. Kidney function test (urine microalbumin)  This test is performed once a year.  Type 1 diabetes: The first test is performed 5 years after diagnosis.  Type 2 diabetes: The first test is performed at the time of diagnosis.  A serum creatinine and estimated glomerular filtration rate (eGFR) test is done once a year to assess the level of chronic kidney disease (CKD), if present. Lipid profile (cholesterol, HDL, LDL, triglycerides)  Performed every 5 years for most people.  The goal for LDL is less than 100 mg/dL. If you are at high risk, the goal is less than 70 mg/dL.  The goal for HDL is 40 mg/dL-50 mg/dL for men and 50 mg/dL-60 mg/dL for women. An HDL cholesterol of 60 mg/dL or higher gives some protection against heart disease.  The goal for triglycerides is less than 150 mg/dL. Immunizations  The flu (influenza) vaccine is recommended yearly for every person 6 months of age or older who has diabetes.  The pneumonia (pneumococcal) vaccine is recommended for every person 2 years of age or older who has diabetes. Adults 65 years of age or older may receive the pneumonia vaccine as a series of two separate shots.  The hepatitis B   vaccine is recommended for adults shortly after they have been diagnosed with diabetes.  The Tdap (tetanus, diphtheria, and pertussis) vaccine should be given:  According to normal childhood vaccination schedules, for children.  Every 10 years, for adults who have diabetes. Diabetes self-management education  Education is recommended at diagnosis and ongoing as needed. Treatment plan  Your treatment plan is reviewed at every medical visit.   This information is not intended to replace advice given to you by your health care provider. Make sure you discuss any questions you  have with your health care provider.   Document Released: 11/24/2008 Document Revised: 02/17/2014 Document Reviewed: 06/29/2012 Elsevier Interactive Patient Education 2016 [employer].   Food Choices to Help Relieve Diarrhea, Adult When you have diarrhea, the foods you eat and your eating habits are very important. Choosing the right foods and drinks can help relieve diarrhea. Also, because diarrhea can last up to 7 days, you need to replace lost fluids and electrolytes (such as sodium, potassium, and chloride) in order to help prevent dehydration.  WHAT GENERAL GUIDELINES DO I NEED TO FOLLOW?  Slowly drink 1 cup (8 oz) of fluid for each episode of diarrhea. If you are getting enough fluid, your urine will be clear or pale yellow.  Eat starchy foods. Some good choices include white rice, white toast, pasta, low-fiber cereal, baked potatoes (without the skin), saltine crackers, and bagels.  Avoid large servings of any cooked vegetables.  Limit fruit to two servings per day. A serving is  cup or 1 small piece.  Choose foods with less than 2 g of fiber per serving.  Limit fats to less than 8 tsp (38 g) per day.  Avoid fried foods.  Eat foods that have probiotics in them. Probiotics can be found in certain dairy products.  Avoid foods and beverages that may increase the speed at which food moves through the stomach and intestines (gastrointestinal tract). Things to avoid include:  High-fiber foods, such as dried fruit, raw fruits and vegetables, nuts, seeds, and whole grain foods.  Spicy foods and high-fat foods.  Foods and beverages sweetened with high-fructose corn syrup, honey, or sugar alcohols such as xylitol, sorbitol, and mannitol. WHAT FOODS ARE RECOMMENDED? Grains White rice. White, [place], or pita breads (fresh or toasted), including plain rolls, buns, or bagels. White pasta. Saltine, soda, or graham crackers. Pretzels. Low-fiber cereal. Cooked cereals made with water  (such as cornmeal, farina, or cream cereals). Plain muffins. Matzo. Melba toast. Zwieback.  Vegetables Potatoes (without the skin). Strained tomato and vegetable juices. Most well-cooked and canned vegetables without seeds. Tender lettuce. Fruits Cooked or canned applesauce, apricots, cherries, fruit cocktail, grapefruit, peaches, pears, or plums. Fresh bananas, apples without skin, cherries, grapes, cantaloupe, grapefruit, peaches, oranges, or plums.  Meat and Other Protein Products Baked or boiled chicken. Eggs. Tofu. Fish. Seafood. Smooth peanut butter. Ground or well-cooked tender beef, ham, veal, lamb, pork, or poultry.  Dairy Plain yogurt, kefir, and unsweetened liquid yogurt. Lactose-free milk, buttermilk, or soy milk. Plain hard cheese. Beverages Sport drinks. Clear broths. Diluted fruit juices (except prune). Regular, caffeine-free sodas such as ginger ale. Water. Decaffeinated teas. Oral rehydration solutions. Sugar-free beverages not sweetened with sugar alcohols. Other Bouillon, broth, or soups made from recommended foods.  The items listed above may not be a complete list of recommended foods or beverages. Contact your dietitian for more options. WHAT FOODS ARE NOT RECOMMENDED? Grains Whole grain, whole wheat, bran, or rye breads, rolls, pastas, crackers, and cereals.  Wild or brown rice. Cereals that contain more than 2 g of fiber per serving. Corn tortillas or taco shells. Cooked or dry oatmeal. Granola. Popcorn. Vegetables Raw vegetables. Cabbage, broccoli, Brussels sprouts, artichokes, baked beans, beet greens, corn, kale, legumes, peas, sweet potatoes, and yams. Potato skins. Cooked spinach and cabbage. Fruits Dried fruit, including raisins and dates. Raw fruits. Stewed or dried prunes. Fresh apples with skin, apricots, mangoes, pears, raspberries, and strawberries.  Meat and Other Protein Products Chunky peanut butter. Nuts and seeds. Beans and lentils. Berniece Salines.   Dairy High-fat cheeses. Milk, chocolate milk, and beverages made with milk, such as milk shakes. Cream. Ice cream. Sweets and Desserts Sweet rolls, doughnuts, and sweet breads. Pancakes and waffles. Fats and Oils Butter. Cream sauces. Margarine. Salad oils. Plain salad dressings. Olives. Avocados.  Beverages Caffeinated beverages (such as coffee, tea, soda, or energy drinks). Alcoholic beverages. Fruit juices with pulp. Prune juice. Soft drinks sweetened with high-fructose corn syrup or sugar alcohols. Other Coconut. Hot sauce. Chili powder. Mayonnaise. Gravy. Cream-based or milk-based soups.  The items listed above may not be a complete list of foods and beverages to avoid. Contact your dietitian for more information. WHAT SHOULD I DO IF I BECOME DEHYDRATED? Diarrhea can sometimes lead to dehydration. Signs of dehydration include dark urine and dry mouth and skin. If you think you are dehydrated, you should rehydrate with an oral rehydration solution. These solutions can be purchased at pharmacies, retail stores, or online.  Drink -1 cup (120-240 mL) of oral rehydration solution each time you have an episode of diarrhea. If drinking this amount makes your diarrhea worse, try drinking smaller amounts more often. For example, drink 1-3 tsp (5-15 mL) every 5-10 minutes.  A general rule for staying hydrated is to drink 1-2 L of fluid per day. Talk to your health care provider about the specific amount you should be drinking each day. Drink enough fluids to keep your urine clear or pale yellow.   This information is not intended to replace advice given to you by your health care provider. Make sure you discuss any questions you have with your health care provider.   Document Released: 04/19/2003 Document Revised: 02/17/2014 Document Reviewed: 12/20/2012 Elsevier Interactive Patient Education [employer].

## 2015-09-27 NOTE — Progress Notes (Signed)
Patient: Melissa Arroyo Female    DOB: Oct 22, 1950   65 y.o.   MRN: [id number] Visit Date: 09/27/2015  Today's Provider: Mar Daring, PA-C   Chief Complaint  Patient presents with  . Hyperlipidemia  . Hypertension  . Diabetes  . Follow-up   Subjective:    HPI  Diabetes Mellitus Type II, Follow-up:   Lab Results  Component Value Date   HGBA1C 8.1 (H) 09/12/2015   HGBA1C 7.3 (H) 03/22/2015   Last seen for diabetes 6 months ago.  Management since then includes continue Metformin and glipizide. Recently added Jardiance with most recent labs. Tolerating ok, noticed cloudy urine but no dysuria. No mycotic infection signs or symptoms. She reports good compliance with treatment. She is not having side effects.  Current symptoms include none and have been stable. Home blood sugar records: not being checked  Episodes of hypoglycemia? no   Weight trend: stable Current diet: well balanced Current exercise: none and 2 times a week  ------------------------------------------------------------------------   Hypertension, follow-up:  BP Readings from Last 3 Encounters:  09/27/15 130/76  07/11/15 (!) 142/70  02/15/15 138/60    She was last seen for hypertension 6 months ago.  BP at that visit was 138/60. Management since that visit includes continue medication.She reports good compliance with treatment. She is not having side effects.  She is exercising. She is adherent to low salt diet.   Outside blood pressures are not being checked. She is experiencing none.  Patient denies none.   Cardiovascular risk factors include diabetes mellitus, dyslipidemia and hypertension.  Use of agents associated with hypertension: none.   ------------------------------------------------------------------------    Lipid/Cholesterol, Follow-up:   Last seen for this 6 months ago.  Management since that visit includes continue Lovastatin.  Last Lipid Panel:    Component Value  Date/Time   CHOL 155 09/12/2015 0916   TRIG 152 (H) 09/12/2015 0916   HDL 54 09/12/2015 0916   CHOLHDL 2.9 09/12/2015 0916   LDLCALC 71 09/12/2015 0916    She reports good compliance with treatment. She is not having side effects.   Wt Readings from Last 3 Encounters:  09/27/15 194 lb 9.6 oz (88.3 kg)  02/15/15 201 lb 9.6 oz (91.4 kg)  01/03/15 194 lb (88 kg)    ------------------------------------------------------------------------    Previous Medications   ASPIRIN EC 81 MG TABLET    Take by mouth.   AZELASTINE-FLUTICASONE (DYMISTA) 137-50 MCG/ACT SUSP    Place 1 spray into the nose 2 (two) times daily.   CLONAZEPAM (KLONOPIN) 1 MG TABLET    Take 1 tablet (1 mg total) by mouth at bedtime. May take 1/2 tab q 8 hrs during the day for panic attacks if needed.   EMPAGLIFLOZIN (JARDIANCE) 25 MG TABS TABLET    Take 25 mg by mouth daily.   FLUTICASONE (FLONASE) 50 MCG/ACT NASAL SPRAY    Place 2 sprays into both nostrils daily.   GLIPIZIDE (GLUCOTROL XL) 10 MG 24 HR TABLET    TAKE 1 TABLET (10 MG) BY ORAL ROUTE ONCE DAILY WITH BREAKFAST   LOVASTATIN (MEVACOR) 40 MG TABLET    Take 40 mg by mouth at bedtime.   METFORMIN (GLUCOPHAGE) 1000 MG TABLET    Take 1 tablet (1,000 mg total) by mouth 2 (two) times daily with a meal.   MONTELUKAST (SINGULAIR) 10 MG TABLET    TAKE ONE TABLET BY MOUTH AT BEDTIME   MYRBETRIQ 25 MG TB24 TABLET    Take 25 mg by  mouth daily.   NITROGLYCERIN (NITROSTAT) 0.4 MG SL TABLET    Place under the tongue.   OLANZAPINE-FLUOXETINE (SYMBYAX) 6-50 MG CAPSULE    Take 1 capsule by mouth daily.   PHENAZOPYRIDINE (PYRIDIUM) 100 MG TABLET    Take 100 mg by mouth 3 (three) times daily as needed for pain. Reported on 07/11/2015   PROPRANOLOL (INDERAL) 10 MG TABLET    Take 5 mg by mouth 2 (two) times daily.    SULFAMETHOXAZOLE-TRIMETHOPRIM (BACTRIM DS,SEPTRA DS) 800-160 MG TABLET    Take 1 tablet by mouth 2 (two) times daily.    Review of Systems  Constitutional: Negative.    Respiratory: Negative.   Cardiovascular: Negative.   Neurological: Negative.   Psychiatric/Behavioral: Positive for dysphoric mood.    Social History  Substance Use Topics  . Smoking status: Never Smoker  . Smokeless tobacco: Never Used  . Alcohol use No     Comment: Quit in 04/24/1993   Objective:   BP 130/76 (BP Location: Right Arm, Patient Position: Sitting, Cuff Size: Large)   Pulse 62   Temp 97.6 F (36.4 C) (Oral)   Wt 194 lb 9.6 oz (88.3 kg)   BMI 32.38 kg/m   Physical Exam  Constitutional: She appears well-developed and well-nourished. No distress.  Neck: Normal range of motion. Neck supple. No JVD present. No tracheal deviation present. No thyromegaly present.  Cardiovascular: Normal rate, regular rhythm, normal heart sounds and intact distal pulses.  Exam reveals no gallop and no friction rub.   No murmur heard. Pulmonary/Chest: Effort normal and breath sounds normal. No respiratory distress. She has no wheezes. She has no rales.  Lymphadenopathy:    She has no cervical adenopathy.  Skin: She is not diaphoretic.  Psychiatric: Her speech is normal and behavior is normal. Thought content normal. She exhibits a depressed mood.  Vitals reviewed.  Diabetic Foot Form - Detailed   Diabetic Foot Exam - detailed Diabetic Foot exam was performed with the following findings:  Yes 09/27/2015  5:00 PM  Is there a history of foot ulcer?:  No Can the patient see the bottom of their feet?:  Yes Are the shoes appropriate in style and fit?:  Yes Is there swelling or and abnormal foot shape?:  No Are the toenails long?:  No Are the toenails thick?:  No Do you have pain in calf while walking?:  No Is there a claw toe deformity?:  No Is there elevated skin temparature?:  No Is there limited skin dorsiflexion?:  No Is there foot or ankle muscle weakness?:  No Are the toenails ingrown?:  No Normal Range of Motion:  Yes Pulse Foot Exam completed.:  Yes  Right posterior  Tibialias:  Present Left posterior Tibialias:  Present  Right Dorsalis Pedis:  Present Left Dorsalis Pedis:  Present  Sensory Foot Exam Completed.:  Yes Swelling:  No Semmes-Weinstein Monofilament Test R Foot Test Control:  Pos L Foot Test Control:  Pos  R Site 1-Great Toe:  Pos L Site 1-Great Toe:  Pos  R Site 4:  Pos L Site 4:  Pos  R Site 5:  Pos L Site 5:  Pos    Comments:  Normal foot exam        Assessment & Plan:     1. Type 2 diabetes mellitus without complication, without long-term current use of insulin (HCC) Accucheck Nano meter given to patient today and lancets and strips sent in to pharmacy. Will continue metformin 1000mg  BID, glipizide 10mg   and jardiance 25mg  daily. Will also refer to diabetic education center. Continue healthy lifestyle modifications. I will see her back in 3 months for recheck. - Ambulatory referral to Nutrition and Diabetic Education - POCT UA - Microalbumin - ACCU-CHEK FASTCLIX LANCETS MISC; Check fasting glucose q a.m.  Dispense: 100 each; Refill: 3 - glucose blood (ACCU-CHEK SMARTVIEW) test strip; Check fasting glucose q a.m.  Dispense: 100 each; Refill: 3 - Alcohol Swabs (ALCOHOL PREP) PADS; Use 1 pad prior to checking blood sugar to clean skin  Dispense: 100 each; Refill: 3  2. Benign essential HTN Stable. Continue current medical treatment plan with propanolol 5mg  BID. Will recheck in 3 months.  3. Combined fat and carbohydrate induced hyperlipemia Stable. Continue Lovastatin 40mg  daily. Will recheck in 6 months.   Follow up: No Follow-up on file.

## 2015-10-04 ENCOUNTER — Other Ambulatory Visit: Payer: Self-pay | Admitting: Physician Assistant

## 2015-10-04 DIAGNOSIS — E119 Type 2 diabetes mellitus without complications: Secondary | ICD-10-CM

## 2015-10-04 MED ORDER — GLUCOSE BLOOD VI STRP: ORAL_STRIP | 12 refills | Status: DC

## 2015-10-04 MED ORDER — FREESTYLE LANCETS MISC: 12 refills | Status: DC

## 2015-10-04 MED ORDER — FREESTYLE LITE DEVI: 0 refills | Status: DC

## 2015-10-04 NOTE — Progress Notes (Signed)
Had to change Rx for glucose device, lancets and strips per insurance

## 2015-10-09 ENCOUNTER — Other Ambulatory Visit: Payer: Self-pay | Admitting: Physician Assistant

## 2015-10-09 DIAGNOSIS — F419 Anxiety disorder, unspecified: Secondary | ICD-10-CM

## 2015-10-10 NOTE — Telephone Encounter (Signed)
jennifers patient, please review-aa

## 2015-10-11 ENCOUNTER — Other Ambulatory Visit: Payer: Self-pay

## 2015-10-11 NOTE — Telephone Encounter (Signed)
Refill request for Lovastatin 40 MG tab Qty:90

## 2015-10-12 MED ORDER — LOVASTATIN 40 MG PO TABS: 40.0000 mg | ORAL_TABLET | Freq: Every day | ORAL | 3 refills | Status: DC

## 2015-10-16 ENCOUNTER — Other Ambulatory Visit: Payer: Self-pay | Admitting: Physician Assistant

## 2015-10-16 DIAGNOSIS — F419 Anxiety disorder, unspecified: Secondary | ICD-10-CM

## 2015-10-17 NOTE — Telephone Encounter (Signed)
Called in to pharmacy. Kerim Statzer Drozdowski, CMA  

## 2015-10-24 ENCOUNTER — Ambulatory Visit: Admitting: *Deleted

## 2015-11-08 ENCOUNTER — Encounter: Payer: Self-pay | Admitting: *Deleted

## 2015-11-08 ENCOUNTER — Encounter: Attending: Physician Assistant | Admitting: *Deleted

## 2015-11-08 VITALS — BP 122/72 | Ht 65.0 in | Wt 187.7 lb

## 2015-11-08 DIAGNOSIS — E119 Type 2 diabetes mellitus without complications: Secondary | ICD-10-CM | POA: Insufficient documentation

## 2015-11-08 NOTE — Patient Instructions (Addendum)
Check blood sugars 1 x day before breakfast or 2 hrs after supper every day  Exercise: Continue walking for  10  minutes  7 days a week and gradually increase to 150 minutes/week  Eat 3 meals day,  1-2 snacks a day Space meals 4-6 hours apart If having bedtime snack - eat 1 protein and 1 carbohydrate serving Complete 3 Day Food Record and bring to next appt  Bring blood sugar records to the next appointment  Return for appointment on:  Thursday November 29, 2015 at 3:30 pm with Jaclyn Shaggy (dietitian)

## 2015-11-08 NOTE — Progress Notes (Signed)
Diabetes Self-Management Education  Visit Type: First/Initial  Appt. Start Time: 1450 Appt. End Time: [id number]  11/08/2015  [name], identified by name and date of birth, is a 65 y.o. female with a diagnosis of Diabetes: Type 2.   ASSESSMENT  Blood pressure 122/72, height 5\' 5"  (1.651 m), weight 187 lb 11.2 oz (85.1 kg). Body mass index is 31.23 kg/m.      Diabetes Self-Management Education - 11/08/15 1710      Visit Information   Visit Type First/Initial     Initial Visit   Diabetes Type Type 2   Are you currently following a meal plan? Yes   What type of meal plan do you follow? "no sugar or starch"   Are you taking your medications as prescribed? Yes   Date Diagnosed 1 1/2 years ago     Health Coping   How would you rate your overall health? Good     Psychosocial Assessment   Patient Belief/Attitude about Diabetes Motivated to manage diabetes   Self-care barriers None   Self-management support Doctor's office   Patient Concerns Nutrition/Meal planning;Glycemic Control;Monitoring;Weight Control;Medication;Healthy Lifestyle   Special Needs None   Preferred Learning Style Auditory;Visual   Learning Readiness Change in progress   How often do you need to have someone help you when you read instructions, pamphlets, or other written materials from your doctor or pharmacy? 1 - Never   What is the last grade level you completed in school? BS in college     Pre-Education Assessment   Patient understands the diabetes disease and treatment process. Needs Instruction   Patient understands incorporating nutritional management into lifestyle. Needs Instruction   Patient undertands incorporating physical activity into lifestyle. Needs Instruction   Patient understands using medications safely. Needs Instruction   Patient understands monitoring blood glucose, interpreting and using results Needs Instruction   Patient understands prevention, detection, and treatment of acute  complications. Needs Instruction   Patient understands prevention, detection, and treatment of chronic complications. Needs Instruction   Patient understands how to develop strategies to address psychosocial issues. Needs Instruction   Patient understands how to develop strategies to promote health/change behavior. Needs Instruction     Complications   Last HgB A1C per patient/outside source 8.1 %   How often do you check your blood sugar? 0 times/day (not testing)  Pt reported that strips were too costly. Discussed using store brand meter with cheaper strips. Gave her 30 strips for her FreeStyle meter that is supposed to be preferred with her health plan. BG today was 135 mg/dL at 3:55 pm - 3 hrs pp.   Have you had a dilated eye exam in the past 12 months? Yes   Have you had a dental exam in the past 12 months? Yes   Are you checking your feet? Yes   How many days per week are you checking your feet? 6     Dietary Intake   Breakfast bread with peanut butter   Lunch grilled chicken salad from [employer] grilled chicken and pecan salad   Snack (evening) cookie and milk   Beverage(s) water     Exercise   Exercise Type Light (walking / raking leaves)   How many days per week to you exercise? 7   How many minutes per day do you exercise? 10   Total minutes per week of exercise 70     Patient Education   Previous Diabetes Education No   Disease  state  Definition of diabetes, type 1 and 2, and the diagnosis of diabetes   Nutrition management  Role of diet in the treatment of diabetes and the relationship between the three main macronutrients and blood glucose level;Food label reading, portion sizes and measuring food.;Carbohydrate counting   Physical activity and exercise  Role of exercise on diabetes management, blood pressure control and cardiac health.   Medications Reviewed patients medication for diabetes, action, purpose, timing of dose and side effects.   Monitoring  Purpose and frequency of SMBG.;Identified appropriate SMBG and/or A1C goals.   Chronic complications Relationship between chronic complications and blood glucose control   Psychosocial adjustment Identified and addressed patients feelings and concerns about diabetes     Individualized Goals (developed by patient)   Reducing Risk Improve blood sugars Decrease medications Prevent diabetes complications Lose weight Lead a healthier lifestyle Become more fit     Outcomes   Expected Outcomes Demonstrated interest in learning. Expect positive outcomes      Individualized Plan for Diabetes Self-Management Training:   Learning Objective:  Patient will have a greater understanding of diabetes self-management. Patient education plan is to attend individual and/or group sessions per assessed needs and concerns.   Plan:   Patient Instructions  Check blood sugars 1 x day before breakfast or 2 hrs after supper every day Exercise: Continue walking for  10  minutes  7 days a week and gradually increase to 150 minutes/week Eat 3 meals day,  1-2 snacks a day Space meals 4-6 hours apart If having bedtime snack - eat 1 protein and 1 carbohydrate serving Complete 3 Day Food Record and bring to next appt Bring blood sugar records to the next appointment Return for appointment on:  Thursday November 29, 2015 at 3:30 pm with Jaclyn Shaggy (dietitian)   Expected Outcomes:  Demonstrated interest in learning. Expect positive outcomes  Education material provided:  General Meal Planning Guidelines Simple Meal Plan FreeStyle Lite strips (30) 3 Day Food Record  If problems or questions, patient to contact team via:   Johny Drilling, [place], [place], CDE [phone]  Future DSME appointment:  Thursday November 29, 2015 for appointment with the dietitian

## 2015-11-23 ENCOUNTER — Other Ambulatory Visit: Payer: Self-pay

## 2015-11-23 ENCOUNTER — Other Ambulatory Visit: Payer: Self-pay | Admitting: Physician Assistant

## 2015-11-23 DIAGNOSIS — F419 Anxiety disorder, unspecified: Secondary | ICD-10-CM

## 2015-11-23 DIAGNOSIS — E119 Type 2 diabetes mellitus without complications: Secondary | ICD-10-CM

## 2015-11-23 DIAGNOSIS — I10 Essential (primary) hypertension: Secondary | ICD-10-CM

## 2015-11-23 DIAGNOSIS — E7800 Pure hypercholesterolemia, unspecified: Secondary | ICD-10-CM

## 2015-11-23 MED ORDER — CLONAZEPAM 1 MG PO TABS
ORAL_TABLET | ORAL | 1 refills | Status: DC
Start: 2015-11-23 — End: 2015-11-23

## 2015-11-23 MED ORDER — EMPAGLIFLOZIN 25 MG PO TABS: 25.0000 mg | ORAL_TABLET | Freq: Every day | ORAL | 3 refills | Status: DC

## 2015-11-23 MED ORDER — GLIPIZIDE ER 10 MG PO TB24: ORAL_TABLET | ORAL | 3 refills | Status: DC

## 2015-11-23 MED ORDER — METFORMIN HCL 1000 MG PO TABS: 1000.0000 mg | ORAL_TABLET | Freq: Two times a day (BID) | ORAL | 3 refills | Status: DC

## 2015-11-23 MED ORDER — LOVASTATIN 40 MG PO TABS: 40.0000 mg | ORAL_TABLET | Freq: Every day | ORAL | 3 refills | Status: DC

## 2015-11-23 MED ORDER — GLUCOSE BLOOD VI STRP: ORAL_STRIP | 12 refills | Status: DC

## 2015-11-23 MED ORDER — FREESTYLE LANCETS MISC: 12 refills | Status: DC

## 2015-11-23 MED ORDER — CLONAZEPAM 1 MG PO TABS: ORAL_TABLET | ORAL | 1 refills | Status: DC

## 2015-11-23 MED ORDER — PROPRANOLOL HCL 10 MG PO TABS: 10.0000 mg | ORAL_TABLET | Freq: Two times a day (BID) | ORAL | 3 refills | Status: DC

## 2015-11-23 NOTE — Telephone Encounter (Signed)
Please double check for me thanks.

## 2015-11-23 NOTE — Telephone Encounter (Signed)
Rx for Klonopin was faxed to [facility].  Thanks,  -Joseline

## 2015-11-23 NOTE — Telephone Encounter (Signed)
Pt is requesting a new Rx sent to [facility] mail order Phone [phone].  Pt states due insurance she will need to change to mail order pharmacy.Marland Kitchen    lovastatin (MEVACOR) 40 MG tablet  metFORMIN (GLUCOPHAGE) 1000 MG tablet  glipiZIDE (GLUCOTROL XL) 10 MG 24 hr tablet  empagliflozin (JARDIANCE) 25 MG TABS tablet  propranolol (INDERAL) 10 MG tablet  Lancets (FREESTYLE) lancets  glucose blood (FREESTYLE LITE) test strip  clonazePAM (KLONOPIN) 1 MG tablet

## 2015-11-29 ENCOUNTER — Encounter: Payer: Self-pay | Admitting: Dietician

## 2015-11-29 ENCOUNTER — Encounter: Attending: Physician Assistant | Admitting: Dietician

## 2015-11-29 VITALS — BP 118/70 | Wt 183.9 lb

## 2015-11-29 DIAGNOSIS — E119 Type 2 diabetes mellitus without complications: Secondary | ICD-10-CM | POA: Diagnosis not present

## 2015-11-29 NOTE — Patient Instructions (Addendum)
Include at least 30 gms carbohydrate per meal. Spread 9 servings of carbohydrate over 3 meals and 3 snacks. Include at least 6 oz. Of protein daily. Refer to list. Include 5 servings of fruits and vegetables daily. Count 1/2 cup of cooked vegetables as 1 serving and 1 cup of raw vegetables as one serving. Include at least 1 starch serving per meal. For example, eat crackers with salad and when possible, a small serving of fruit. Include another 2 cups of water per day in addition to 48 oz. now drinking. Increase milk products to 2 per day. May want to consider a calcium supplement. Use calorieking.com to look up specific food choices especially when dining "out".

## 2015-11-29 NOTE — Progress Notes (Signed)
Diabetes Self-Management Education  Visit Type: Follow-up  Appt. Start Time: 1520 Appt. End Time: 1430  11/29/2015  [name], identified by name and date of birth, is a 65 y.o. female with a diagnosis of Diabetes:  .   ASSESSMENT  Blood pressure 118/70, weight 183 lb 14.4 oz (83.4 kg). Body mass index is 30.6 kg/m.      Diabetes Self-Management Education - 11/29/15 1722      Visit Information   Visit Type Follow-up     Complications   Last HgB A1C per patient/outside source 8.1 %   How often do you check your blood sugar? 0 times/day (not testing)  Meter not working   Have you had a dilated eye exam in the past 12 months? Yes   Have you had a dental exam in the past 12 months? Yes   Are you checking your feet? Yes   How many days per week are you checking your feet? 6     Dietary Intake   Breakfast 2/3 slice bread (whole slice doesn't fit in toaster), 2 Tbsp peanut butter,water   Snack (morning) 1/2 Belvita cookie   Lunch Grilled chicken salad from [employer] with spinach, lettuce, olives, water   Snack (afternoon) fruit or 1/2 Belvita cookie   Dinner [employer] grilled chicken and pecan salad   Snack (evening) 4 oz. milk, cookie and apple   Beverage(s) water, milk     Exercise   Exercise Type Light (walking / raking leaves)   How many days per week to you exercise? 3   How many minutes per day do you exercise? 10   Total minutes per week of exercise 30     Patient Education   Nutrition management  Role of diet in the treatment of diabetes and the relationship between the three main macronutrients and blood glucose level;Food label reading, portion sizes and measuring food.;Carbohydrate counting;Meal timing in regards to the patients' current diabetes medication.;Information on hints to eating out and maintain blood glucose control.;Meal options for control of blood glucose level and chronic complications.   Physical activity and exercise  Role of exercise on  diabetes management, blood pressure control and cardiac health.   Monitoring Taught/evaluated SMBG meter.  Patient's meter was not working; gave a replacement meter; review procedure   Acute complications Taught treatment of hypoglycemia - the 15 rule.   Chronic complications Reviewed with patient heart disease, higher risk of, and prevention;Relationship between chronic complications and blood glucose control     Individualized Goals (developed by patient)   Nutrition Follow meal plan discussed   Physical Activity Exercise 3-5 times per week   Monitoring  test my blood glucose as discussed     Post-Education Assessment   Patient understands the diabetes disease and treatment process. Demonstrates understanding / competency   Patient understands incorporating nutritional management into lifestyle. Demonstrates understanding / competency   Patient undertands incorporating physical activity into lifestyle. Demonstrates understanding / competency   Patient understands using medications safely. Demonstrates understanding / competency   Patient understands monitoring blood glucose, interpreting and using results Demonstrates understanding / competency   Patient understands prevention, detection, and treatment of acute complications. Demonstrates understanding / competency   Patient understands prevention, detection, and treatment of chronic complications. Demonstrates understanding / competency   Patient understands how to develop strategies to address psychosocial issues. Demonstrates understanding / competency   Patient understands how to develop strategies to promote health/change behavior. Demonstrates understanding / competency     Outcomes  Expected Outcomes Demonstrated interest in learning. Expect positive outcomes   Future DMSE PRN   Program Status Completed      Individualized Plan for Diabetes Self-Management Training:   Learning Objective:  Patient will have a greater  understanding of diabetes self-management. Patient education plan is to attend individual and/or group sessions per assessed needs and concerns. Instructed on a 1400 calorie meal plan based on diabetes dietary guidelines.    Plan:   Patient Instructions  Include at least 30 gms carbohydrate per meal. Spread 9 servings of carbohydrate over 3 meals and 3 snacks. Include at least 6 oz. Of protein daily. Refer to list. Include 5 servings of fruits and vegetables daily. Count 1/2 cup of cooked vegetables as 1 serving and 1 cup of raw vegetables as one serving. Include at least 1 starch serving per meal. For example, eat crackers with salad and when possible, a small serving of fruit. Include another 2 cups of water per day in addition to 48 oz. now drinking. Increase milk products to 2 per day. May want to consider a calcium supplement.   Expected Outcomes:  Demonstrated interest in learning. Expect positive outcomes  Education material provided: Planning a Balanced meal  If problems or questions, patient to contact team via:  Karolee Stamps, RD,CDE   Future DSME appointment: PRN

## 2015-12-20 ENCOUNTER — Encounter: Payer: Self-pay | Admitting: Physician Assistant

## 2015-12-20 ENCOUNTER — Ambulatory Visit (INDEPENDENT_AMBULATORY_CARE_PROVIDER_SITE_OTHER): Admitting: Physician Assistant

## 2015-12-20 VITALS — BP 128/96 | HR 66 | Temp 97.8°F | Resp 16 | Wt 181.4 lb

## 2015-12-20 DIAGNOSIS — H6983 Other specified disorders of Eustachian tube, bilateral: Secondary | ICD-10-CM | POA: Diagnosis not present

## 2015-12-20 DIAGNOSIS — E119 Type 2 diabetes mellitus without complications: Secondary | ICD-10-CM | POA: Diagnosis not present

## 2015-12-20 LAB — POCT GLYCOSYLATED HEMOGLOBIN (HGB A1C)

## 2015-12-20 MED ORDER — PREDNISONE 10 MG (21) PO TBPK: ORAL_TABLET | ORAL | 0 refills | Status: DC

## 2015-12-20 NOTE — Progress Notes (Signed)
Patient: Melissa Arroyo Female    DOB: 08-May-1950   65 y.o.   MRN: [id number] Visit Date: 12/20/2015  Today's Provider: Mar Daring, PA-C   Chief Complaint  Patient presents with  . Diabetes  . Hypertension  . Hyperlipidemia   Subjective:    HPI   Diabetes Mellitus Type II, Follow-up:   Lab Results  Component Value Date   HGBA1C 6.3% 12/20/2015   HGBA1C 8.1 (H) 09/12/2015   HGBA1C 7.3 (H) 03/22/2015   Last seen for diabetes 09/27/15 3 months ago.  Management since then includes Nutrionist and Diabetes Education and adding Jardiance. She reports excellent compliance with treatment. She is not having side effects. Current symptoms include none and have been stable. Home blood sugar records: fasting range: 109, after eating patient reports sugar reading high at 195.   Episodes of hypoglycemia? no   Current Insulin Regimen: none Most Recent Eye Exam: 01/2014 Weight trend: decreasing steadily Prior visit with dietician: yes - 11/29/15 Current diet: in general, a "healthy" diet   Current exercise: no regular exercise  ------------------------------------------------------------------------   Hypertension, follow-up:  BP Readings from Last 3 Encounters:  12/20/15 (!) 128/96  11/29/15 118/70  11/08/15 122/72    She was last seen for hypertension 3 months ago.  BP at that visit was 130/76. Management since that visit includes No chanes.She reports excellent compliance with treatment. She is not having side effects.  She is not exercising. She is adherent to low salt diet.   Outside blood pressures are: N/A. She is experiencing exertional chest pressure/discomfort.  Patient denies chest pain, chest pressure/discomfort, claudication, dyspnea, fatigue, irregular heart beat, lower extremity edema, near-syncope, orthopnea, palpitations, paroxysmal nocturnal dyspnea, syncope and tachypnea.   Cardiovascular risk factors include advanced age (older than 3  for men, 55 for women), diabetes mellitus and hypertension.  Use of agents associated with hypertension: NSAIDS.   ------------------------------------------------------------------------    Lipid/Cholesterol, Follow-up:   Last seen for this 3 months ago.  Management since that visit includes none.  Last Lipid Panel:    Component Value Date/Time   CHOL 155 09/12/2015 0916   TRIG 152 (H) 09/12/2015 0916   HDL 54 09/12/2015 0916   CHOLHDL 2.9 09/12/2015 0916   LDLCALC 71 09/12/2015 0916    She reports excellent compliance with treatment. She is not having side effects.  Wt Readings from Last 3 Encounters:  12/20/15 181 lb 6.4 oz (82.3 kg)  11/29/15 183 lb 14.4 oz (83.4 kg)  11/08/15 187 lb 11.2 oz (85.1 kg)    ------------------------------------------------------------------------      Allergies  Allergen Reactions  . Penicillins Rash  . Vesicare [Solifenacin] Anaphylaxis    Throat Closed     Current Outpatient Prescriptions:  .  Alcohol Swabs (ALCOHOL PREP) PADS, Use 1 pad prior to checking blood sugar to clean skin, Disp: 100 each, Rfl: 3 .  aspirin EC 81 MG tablet, Take 81 mg by mouth daily. , Disp: , Rfl:  .  Azelastine-Fluticasone (DYMISTA) 137-50 MCG/ACT SUSP, Place 1 spray into the nose 2 (two) times daily. (Patient taking differently: Place 1 spray into the nose 2 (two) times daily as needed. ), Disp: 23 g, Rfl: 0 .  Blood Glucose Monitoring Suppl (FREESTYLE LITE) DEVI, Check fasting blood sugar q a.m., Disp: 1 each, Rfl: 0 .  clonazePAM (KLONOPIN) 1 MG tablet, TAKE ONE TABLET BY MOUTH AT BEDTIME.  MAY TAKE ONE-HALF TABLET EVERY 8 HOURS DURING THE DAY FOR PANIC  ATTACKS IF NEEDED, Disp: 180 tablet, Rfl: 1 .  empagliflozin (JARDIANCE) 25 MG TABS tablet, Take 25 mg by mouth daily., Disp: 90 tablet, Rfl: 3 .  fluticasone (FLONASE) 50 MCG/ACT nasal spray, Place 2 sprays into both nostrils daily., Disp: 16 g, Rfl: 6 .  glipiZIDE (GLUCOTROL XL) 10 MG 24 hr tablet,  TAKE 1 TABLET (10 MG) BY ORAL ROUTE ONCE DAILY WITH BREAKFAST, Disp: 90 tablet, Rfl: 3 .  Glucosamine-Chondroitin (MOVE FREE PO), Take 1 tablet by mouth daily., Disp: , Rfl:  .  glucose blood (FREESTYLE LITE) test strip, Check fasting blood sugar q a.m., Disp: 100 each, Rfl: 12 .  ibuprofen (ADVIL,MOTRIN) 200 MG tablet, Take 400 mg by mouth at bedtime., Disp: , Rfl:  .  Lancets (FREESTYLE) lancets, Check fasting blood glucose q a.m, Disp: 100 each, Rfl: 12 .  lovastatin (MEVACOR) 40 MG tablet, Take 1 tablet (40 mg total) by mouth at bedtime., Disp: 90 tablet, Rfl: 3 .  metFORMIN (GLUCOPHAGE) 1000 MG tablet, Take 1 tablet (1,000 mg total) by mouth 2 (two) times daily with a meal., Disp: 180 tablet, Rfl: 3 .  Multiple Vitamins-Minerals (CENTRUM SILVER PO), Take 1 tablet by mouth daily., Disp: , Rfl:  .  nitroGLYCERIN (NITROSTAT) 0.4 MG SL tablet, Place 0.4 mg under the tongue every 5 (five) minutes as needed. , Disp: , Rfl:  .  OLANZapine-FLUoxetine (SYMBYAX) 6-50 MG capsule, Take 1 capsule by mouth daily., Disp: 30 capsule, Rfl: 6 .  propranolol (INDERAL) 10 MG tablet, Take 1 tablet (10 mg total) by mouth 2 (two) times daily., Disp: 180 tablet, Rfl: 3 .  Ubiquinone (ULTRA COQ10 PO), Take 1 tablet by mouth daily., Disp: , Rfl:   Review of Systems  Constitutional: Negative.   HENT: Positive for ear pain (patient reports pressure in both ears that began Sunday;she did also see Dr. Pryor Ochoa and had normal work up).   Respiratory: Negative.   Cardiovascular: Negative.   Gastrointestinal: Negative.   Endocrine: Negative.   Genitourinary: Negative.   Musculoskeletal: Negative.   Skin: Negative.   Allergic/Immunologic: Negative.   Neurological: Positive for dizziness. Negative for weakness, light-headedness, numbness and headaches.  Hematological: Negative.   Psychiatric/Behavioral: Negative.     Social History  Substance Use Topics  . Smoking status: Never Smoker  . Smokeless tobacco: Never  Used  . Alcohol use No     Comment: Quit in 04/24/1993   Objective:   BP (!) 128/96   Pulse 66   Temp 97.8 F (36.6 C) (Oral)   Resp 16   Wt 181 lb 6.4 oz (82.3 kg)   BMI 30.19 kg/m   Physical Exam  Constitutional: She appears well-developed and well-nourished. No distress.  HENT:  Head: Normocephalic and atraumatic.  Right Ear: Hearing, tympanic membrane, external ear and ear canal normal.  Left Ear: Hearing, tympanic membrane, external ear and ear canal normal.  Nose: Nose normal.  Mouth/Throat: Uvula is midline, oropharynx is clear and moist and mucous membranes are normal. No oropharyngeal exudate.  Eyes: Conjunctivae are normal. Pupils are equal, round, and reactive to light. Right eye exhibits no discharge. Left eye exhibits no discharge. No scleral icterus.  Neck: Normal range of motion. Neck supple. No tracheal deviation present. No thyromegaly present.  Cardiovascular: Normal rate, regular rhythm and normal heart sounds.  Exam reveals no gallop and no friction rub.   No murmur heard. Pulmonary/Chest: Effort normal and breath sounds normal. No stridor. No respiratory distress. She has no wheezes. She  has no rales.  Musculoskeletal: She exhibits no edema.  Lymphadenopathy:    She has no cervical adenopathy.  Skin: Skin is warm and dry. She is not diaphoretic.  Vitals reviewed.      Assessment & Plan:     1. Type 2 diabetes mellitus without complication, without long-term current use of insulin (HCC) A1c much improved today and now below goal at 6.3. Continue Jardiance and lifestyle modifications. I will see her back in 3 months to recheck labs. - POCT glycosylated hemoglobin (Hb A1C)  2. Dysfunction of both eustachian tubes Failed intranasal corticosteroids. ENT work up thus far negative. Will try prednisone taper as below to see if any improvement in symptoms. She is to call if symptoms worsen. - predniSONE (STERAPRED UNI-PAK 21 TAB) 10 MG (21) TBPK tablet; Take as  directed on package instructions  Dispense: 21 tablet; Refill: 0       Mar Daring, PA-C  [facility]

## 2015-12-20 NOTE — Patient Instructions (Signed)

## 2015-12-27 ENCOUNTER — Emergency Department

## 2015-12-27 ENCOUNTER — Encounter: Payer: Self-pay | Admitting: Emergency Medicine

## 2015-12-27 ENCOUNTER — Other Ambulatory Visit: Payer: Self-pay

## 2015-12-27 ENCOUNTER — Emergency Department
Admission: EM | Admit: 2015-12-27 | Discharge: 2015-12-27 | Disposition: A | Discharge status: Home / Self Care | Attending: Emergency Medicine | Admitting: Emergency Medicine

## 2015-12-27 DIAGNOSIS — Z7984 Long term (current) use of oral hypoglycemic drugs: Secondary | ICD-10-CM | POA: Insufficient documentation

## 2015-12-27 DIAGNOSIS — H9193 Unspecified hearing loss, bilateral: Secondary | ICD-10-CM | POA: Insufficient documentation

## 2015-12-27 DIAGNOSIS — I10 Essential (primary) hypertension: Secondary | ICD-10-CM | POA: Diagnosis not present

## 2015-12-27 DIAGNOSIS — Z79899 Other long term (current) drug therapy: Secondary | ICD-10-CM | POA: Insufficient documentation

## 2015-12-27 DIAGNOSIS — Z7982 Long term (current) use of aspirin: Secondary | ICD-10-CM | POA: Diagnosis not present

## 2015-12-27 DIAGNOSIS — F419 Anxiety disorder, unspecified: Secondary | ICD-10-CM | POA: Insufficient documentation

## 2015-12-27 DIAGNOSIS — R42 Dizziness and giddiness: Secondary | ICD-10-CM

## 2015-12-27 DIAGNOSIS — E119 Type 2 diabetes mellitus without complications: Secondary | ICD-10-CM | POA: Insufficient documentation

## 2015-12-27 LAB — CBC WITH DIFFERENTIAL/PLATELET
BASOS PCT: 0 %
Basophils Absolute: 0 10*3/uL (ref 0–0.1)
EOS ABS: 0 10*3/uL (ref 0–0.7)
EOS PCT: 0 %
HCT: 43.2 % (ref 35.0–47.0)
HEMOGLOBIN: 14.6 g/dL (ref 12.0–16.0)
Lymphocytes Relative: 20 %
Lymphs Abs: 2.5 10*3/uL (ref 1.0–3.6)
MCH: 31.2 pg (ref 26.0–34.0)
MCHC: 33.7 g/dL (ref 32.0–36.0)
MCV: 92.6 fL (ref 80.0–100.0)
MONO ABS: 0.9 10*3/uL (ref 0.2–0.9)
MONOS PCT: 7 %
NEUTROS PCT: 73 %
Neutro Abs: 9 10*3/uL — ABNORMAL HIGH (ref 1.4–6.5)
PLATELETS: 281 10*3/uL (ref 150–440)
RBC: 4.66 MIL/uL (ref 3.80–5.20)
RDW: 13.6 % (ref 11.5–14.5)
WBC: 12.5 10*3/uL — ABNORMAL HIGH (ref 3.6–11.0)

## 2015-12-27 LAB — COMPREHENSIVE METABOLIC PANEL
ALBUMIN: 4.4 g/dL (ref 3.5–5.0)
ALT: 23 U/L (ref 14–54)
ANION GAP: 9 (ref 5–15)
AST: 22 U/L (ref 15–41)
Alkaline Phosphatase: 61 U/L (ref 38–126)
BUN: 25 mg/dL — ABNORMAL HIGH (ref 6–20)
CO2: 26 mmol/L (ref 22–32)
Calcium: 9.7 mg/dL (ref 8.9–10.3)
Chloride: 104 mmol/L (ref 101–111)
Creatinine, Ser: 0.86 mg/dL (ref 0.44–1.00)
GFR calc non Af Amer: >60 mL/min (ref >60)
GLUCOSE: 99 mg/dL (ref 65–99)
POTASSIUM: 4.2 mmol/L (ref 3.5–5.1)
SODIUM: 139 mmol/L (ref 135–145)
Total Bilirubin: 0.4 mg/dL (ref 0.3–1.2)
Total Protein: 7.9 g/dL (ref 6.5–8.1)

## 2015-12-27 LAB — URINALYSIS COMPLETE WITH MICROSCOPIC (ARMC ONLY)
BACTERIA UA: NONE SEEN
BILIRUBIN URINE: NEGATIVE
HGB URINE DIPSTICK: NEGATIVE
Ketones, ur: NEGATIVE mg/dL
LEUKOCYTES UA: NEGATIVE
NITRITE: NEGATIVE
PH: 5 (ref 5.0–8.0)
Protein, ur: NEGATIVE mg/dL
RBC / HPF: NONE SEEN RBC/hpf (ref 0–5)
SPECIFIC GRAVITY, URINE: 1.005 (ref 1.005–1.030)

## 2015-12-27 MED ORDER — MECLIZINE HCL 25 MG PO TABS
50.0000 mg | ORAL_TABLET | Freq: Once | ORAL | Status: AC
  Administered 2015-12-27: 50 mg via ORAL
  Filled 2015-12-27: qty 2

## 2015-12-27 MED ORDER — MECLIZINE HCL 25 MG PO TABS: 25.0000 mg | ORAL_TABLET | Freq: Three times a day (TID) | ORAL | 1 refills | Status: DC | PRN

## 2015-12-27 MED ORDER — DIAZEPAM 5 MG PO TABS
10.0000 mg | ORAL_TABLET | Freq: Once | ORAL | Status: AC
  Administered 2015-12-27: 10 mg via ORAL
  Filled 2015-12-27: qty 2

## 2015-12-27 MED ORDER — SODIUM CHLORIDE 0.9 % IV SOLN
1000.0000 mL | Freq: Once | INTRAVENOUS | Status: AC
  Administered 2015-12-27: 1000 mL via INTRAVENOUS

## 2015-12-27 NOTE — ED Provider Notes (Signed)
[facility] Emergency Department Provider Note        Time seen: ----------------------------------------- 10:28 AM on 12/27/2015 -----------------------------------------    I have reviewed the triage vital signs and the nursing notes.   HISTORY  Chief Complaint Dizziness and Ear Fullness    HPI Melissa Arroyo is a 65 y.o. female who presents to ER for dizziness and ear muffling for the past several months on and off. Patient's been seen by her primary care doctor as well as by ENT without a specific diagnosis. Patient reports medications do not seem to help her symptoms. She denies recent illness, has been placed on prednisone recently with no relief.   Past Medical History:  Diagnosis Date  . Anxiety   . Depression   . Diabetes mellitus ([place])   . Heart attack 04/2007  . Heart attack   . Hyperlipidemia   . Hypertension   . Obesity   . Recurrent UTI   . Renal lesion   . Urinary incontinence in female   . Vaginal atrophy     Patient Active Problem List   Diagnosis Date Noted  . Benign essential HTN 02/14/2015  . Dysuria 12/24/2014  . Recurrent UTI 09/28/2014  . Dyspareunia 09/28/2014  . Atrophic vaginitis 09/28/2014  . Urge incontinence 09/28/2014  . Renal cyst, left 07/24/2014  . Microscopic hematuria 07/24/2014  . Headache, tension-type 07/24/2014  . Combined fat and carbohydrate induced hyperlipemia 07/24/2014  . Diabetes mellitus, type 2 ([place]) 07/24/2014    Past Surgical History:  Procedure Laterality Date  . BUNIONECTOMY  2001  . NECK SURGERY    . ROTATOR CUFF REPAIR Left     Allergies Penicillins and Vesicare [solifenacin]  Social History Social History  Substance Use Topics  . Smoking status: Never Smoker  . Smokeless tobacco: Never Used  . Alcohol use No     Comment: Quit in 04/24/1993    Review of Systems Constitutional: Negative for fever. ENT: Positive for decreased hearing in the ears Cardiovascular:  Negative for chest pain. Respiratory: Negative for shortness of breath. Gastrointestinal: Negative for abdominal pain, vomiting and diarrhea. Genitourinary: Negative for dysuria. Musculoskeletal: Negative for back pain. Skin: Negative for rash. Neurological: Negative for headaches, focal weakness or numbness. Positive for dizziness Psychiatric: Positive for anxiety 10-point ROS otherwise negative.  ____________________________________________   PHYSICAL EXAM:  VITAL SIGNS: ED Triage Vitals [12/27/15 1007]  Enc Vitals Group     BP      Pulse      Resp      Temp      Temp src      SpO2      Weight 181 lb (82.1 kg)     Height      Head Circumference      Peak Flow      Pain Score      Pain Loc      Pain Edu?      Excl. in [place]?    Constitutional: Alert and oriented. Well appearing and in no distress. Eyes: Conjunctivae are normal. PERRL. Normal extraocular movements. ENT   Head: Normocephalic and atraumatic.      Ears: TMs are clear bilaterally   Nose: No congestion/rhinnorhea.   Mouth/Throat: Mucous membranes are moist.   Neck: No stridor. Cardiovascular: Normal rate, regular rhythm. No murmurs, rubs, or gallops. Respiratory: Normal respiratory effort without tachypnea nor retractions. Breath sounds are clear and equal bilaterally. No wheezes/rales/rhonchi. Gastrointestinal: Soft and nontender. Normal bowel sounds Musculoskeletal: Nontender with  normal range of motion in all extremities. No lower extremity tenderness nor edema. Neurologic:  Normal speech and language. No gross focal neurologic deficits are appreciated.  Skin:  Skin is warm, dry and intact. No rash noted. Psychiatric: Mood and affect are normal. Speech and behavior are normal.  ____________________________________________  EKG: Interpreted by me. Sinus rhythm with a rate of 52 bpm, normal PR interval, normal QRS, normal QT, normal axis.  ____________________________________________  ED  COURSE:  Pertinent labs & imaging results that were available during my care of the patient were reviewed by me and considered in my medical decision making (see chart for details). Clinical Course   Patient is in no distress, we will assess with basic labs, give meclizine and obtain CT imaging.  Procedures ____________________________________________   LABS (pertinent positives/negatives)  Labs Reviewed  CBC WITH DIFFERENTIAL/PLATELET - Abnormal; Notable for the following:       Result Value   WBC 12.5 (*)    Neutro Abs 9.0 (*)    All other components within normal limits  COMPREHENSIVE METABOLIC PANEL - Abnormal; Notable for the following:    BUN 25 (*)    All other components within normal limits  URINALYSIS COMPLETEWITH MICROSCOPIC ([facility] ONLY)    RADIOLOGY Images were viewed by me  CT head IMPRESSION: Small amount of apparent fatty tissue at the level of the right carotid siphon. Although rare, a lipoma arising from the optic nerve in this region is a possibility.  There is mild diffuse atrophy. There is mild cerebellar tonsillar ectopia.  No acute infarct. No hemorrhage. No extra-axial fluid collection. MRI head without contrast IMPRESSION: No acute intracranial process.  Borderline parenchymal brain volume loss for age, otherwise negative MRI head. ____________________________________________  FINAL ASSESSMENT AND PLAN  Dizziness  Plan: Patient with labs and imaging as dictated above. No clear etiology for her dizziness. She'll be discharged with meclizine, is encouraged to continue outpatient follow-up as scheduled.   Earleen Newport, MD   Note: This dictation was prepared with Dragon dictation. Any transcriptional errors that result from this process are unintentional    Earleen Newport, MD 12/27/15 [phone]

## 2015-12-27 NOTE — ED Notes (Signed)
Patient transported to MRI 

## 2015-12-27 NOTE — ED Triage Notes (Signed)
Pt with dizziness and ear muffling for four days. Pt seen by pcp, given prednisone with no relief.

## 2015-12-28 ENCOUNTER — Telehealth: Payer: Self-pay | Admitting: Physician Assistant

## 2015-12-28 NOTE — Telephone Encounter (Signed)
[email or web address]

## 2015-12-28 NOTE — Telephone Encounter (Signed)
Pt was discharged from [facility] ER on 12/27/2015 for dizziness and ear pain.  I have scheduled an appointment for 01/02/2016/MW

## 2016-01-02 ENCOUNTER — Ambulatory Visit (INDEPENDENT_AMBULATORY_CARE_PROVIDER_SITE_OTHER): Admitting: Physician Assistant

## 2016-01-02 ENCOUNTER — Encounter: Payer: Self-pay | Admitting: Physician Assistant

## 2016-01-02 VITALS — BP 100/66 | HR 72 | Temp 97.7°F | Resp 16 | Wt 180.0 lb

## 2016-01-02 DIAGNOSIS — R42 Dizziness and giddiness: Secondary | ICD-10-CM | POA: Diagnosis not present

## 2016-01-02 DIAGNOSIS — F419 Anxiety disorder, unspecified: Secondary | ICD-10-CM | POA: Diagnosis not present

## 2016-01-02 MED ORDER — DIAZEPAM 5 MG PO TABS: 5.0000 mg | ORAL_TABLET | Freq: Two times a day (BID) | ORAL | 1 refills | Status: DC | PRN

## 2016-01-02 NOTE — Progress Notes (Signed)
Patient: Melissa Arroyo Female    DOB: [birth year]   65 y.o.   MRN: [id number] Visit Date: 01/02/2016  Today's Provider: Mar Daring, PA-C   Chief Complaint  Patient presents with  . Follow-up    ER 12-27-2015   Subjective:    HPI  Follow up ER visit  Patient was seen in ER for dizziness and ear discomfort on 12-27-2015. She was treated for Dizziness and ear fullness. Treatment for this included labs and imaging: CT and MRI, No clear etiology for dizziness.  Patient started on Meclizine. She reports excellent compliance with treatment. She reports this condition is Unchanged. Patient reports that after starting prednisone she was feeling better but symptoms are worse today.  She reports that yesterday she had a very stressful day at work which exacerbated her dizziness and she would rate it and 8 out of 10 on inability to do normal activities secondary to dizziness. Patient rates symptoms a 6 today. Patient requesting a low dose of Valium to use at work for the dizziness. 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 She is considering using FMLA for intermittently for when she has flares of her dizzy spells and for future appointments. ------------------------------------------------------------------------------------     Allergies  Allergen Reactions  . Penicillins Rash  . Vesicare [Solifenacin] Anaphylaxis    Throat Closed     Current Outpatient Prescriptions:  .  Alcohol Swabs (ALCOHOL PREP) PADS, Use 1 pad prior to checking blood sugar to clean skin, Disp: 100 each, Rfl: 3 .  aspirin EC 81 MG tablet, Take 81 mg by mouth daily. , Disp: , Rfl:  .  Azelastine-Fluticasone (DYMISTA) 137-50 MCG/ACT SUSP, Place 1 spray into the nose 2 (two) times daily. (Patient taking differently: Place 1 spray into the nose 2 (two) times daily as needed. ), Disp: 23 g, Rfl: 0 .  Blood Glucose  Monitoring Suppl (FREESTYLE LITE) DEVI, Check fasting blood sugar q a.m., Disp: 1 each, Rfl: 0 .  clonazePAM (KLONOPIN) 1 MG tablet, TAKE ONE TABLET BY MOUTH AT BEDTIME.  MAY TAKE ONE-HALF TABLET EVERY 8 HOURS DURING THE DAY FOR PANIC ATTACKS IF NEEDED, Disp: 180 tablet, Rfl: 1 .  empagliflozin (JARDIANCE) 25 MG TABS tablet, Take 25 mg by mouth daily., Disp: 90 tablet, Rfl: 3 .  FLUARIX QUADRIVALENT 0.5 ML injection, once., Disp: , Rfl:  .  fluticasone (FLONASE) 50 MCG/ACT nasal spray, Place 2 sprays into both nostrils daily., Disp: 16 g, Rfl: 6 .  glipiZIDE (GLUCOTROL XL) 10 MG 24 hr tablet, TAKE 1 TABLET (10 MG) BY ORAL ROUTE ONCE DAILY WITH BREAKFAST, Disp: 90 tablet, Rfl: 3 .  Glucosamine-Chondroitin (MOVE FREE PO), Take 1 tablet by mouth daily., Disp: , Rfl:  .  glucose blood (FREESTYLE LITE) test strip, Check fasting blood sugar q a.m., Disp: 100 each, Rfl: 12 .  Lancets (FREESTYLE) lancets, Check fasting blood glucose q a.m, Disp: 100 each, Rfl: 12 .  lovastatin (MEVACOR) 40 MG tablet, Take 1 tablet (40 mg total) by mouth at bedtime., Disp: 90 tablet, Rfl: 3 .  meclizine (ANTIVERT) 25 MG tablet, Take 1 tablet (25 mg total) by mouth 3 (three) times daily as needed for dizziness or nausea., Disp: 30 tablet, Rfl: 1 .  metFORMIN (GLUCOPHAGE) 1000 MG tablet, Take 1 tablet (1,000 mg total) by mouth 2 (two) times daily with a meal., Disp: 180 tablet, Rfl: 3 .  Multiple Vitamins-Minerals (CENTRUM SILVER PO), Take 1 tablet by mouth daily., Disp: , Rfl:  .  nitroGLYCERIN (NITROSTAT) 0.4 MG SL tablet, Place 0.4 mg under the tongue every 5 (five) minutes as needed. , Disp: , Rfl:  .  OLANZapine-FLUoxetine (SYMBYAX) 6-50 MG capsule, Take 1 capsule by mouth daily., Disp: 30 capsule, Rfl: 6 .  PREVNAR 13 SUSP injection, , Disp: , Rfl:  .  propranolol (INDERAL) 10 MG tablet, Take 1 tablet (10 mg total) by mouth 2 (two) times daily., Disp: 180 tablet, Rfl: 3 .  Ubiquinone (ULTRA COQ10 PO), Take 1 tablet by  mouth daily., Disp: , Rfl:  .  ibuprofen (ADVIL,MOTRIN) 200 MG tablet, Take 400 mg by mouth at bedtime., Disp: , Rfl:   Review of Systems  Constitutional: Negative.   HENT: Positive for ear pain (Fullness). Negative for congestion, hearing loss, postnasal drip, rhinorrhea, sinus pain, sinus pressure, sore throat and tinnitus.        Ear fullness  Eyes: Negative for visual disturbance.  Respiratory: Negative.   Cardiovascular: Negative.   Gastrointestinal: Negative.   Neurological: Positive for dizziness and headaches.  Psychiatric/Behavioral: Positive for dysphoric mood. The patient is nervous/anxious.     Social History  Substance Use Topics  . Smoking status: Never Smoker  . Smokeless tobacco: Never Used  . Alcohol use No     Comment: Quit in 04/24/1993   Objective:   BP 100/66 (BP Location: Left Arm, Patient Position: Sitting, Cuff Size: Large)   Pulse 72   Temp 97.7 F (36.5 C) (Oral)   Resp 16   Wt 180 lb (81.6 kg)   SpO2 96%   BMI 29.95 kg/m   Physical Exam  Constitutional: She is oriented to person, place, and time. She appears well-developed and well-nourished. No distress.  Neck: Normal range of motion. Neck supple. No JVD present. No tracheal deviation present. No thyromegaly present.  Cardiovascular: Normal rate, regular rhythm and normal heart sounds.  Exam reveals no gallop and no friction rub.   No murmur heard. Pulmonary/Chest: Effort normal and breath sounds normal. No respiratory distress. She has no wheezes. She has no rales.  Musculoskeletal: She exhibits no edema.  Lymphadenopathy:    She has no cervical adenopathy.  Neurological: She is alert and oriented to person, place, and time. No cranial nerve deficit. Coordination normal.  Skin: She is not diaphoretic.  Psychiatric: Her mood appears anxious. She exhibits a depressed mood.  Vitals reviewed.      Assessment & Plan:     1. Dizziness Diazepam given as below for her to use when she is having  an acute episode of vertigo. She is to take half a tablet to 1 tablet as needed she does see Dr. Pryor Ochoa next week for follow-up. I did discuss vestibular rehabilitation with her. She is going to talk to Dr. Pryor Ochoa to see if he feels she may benefit from vestibular rehabilitation and also for consideration of referral for second opinion. She is to call the office if she has any acute issue in the meantime. If not I will see her back in 3 months. - diazepam (VALIUM) 5 MG tablet; Take 1 tablet (5 mg total) by mouth every 12 (twelve) hours as needed. For dizziness  Dispense: 60 tablet; Refill: 1  2. Acute anxiety I do feel that stress and anxiety may be a trigger for her vertigo spells that she is experiencing. I will give her Valium as noted above. I will see her back in  3 months.       Mar Daring, PA-C  [facility]

## 2016-01-02 NOTE — Patient Instructions (Signed)
Dizziness Dizziness is a common problem. It makes you feel unsteady or lightheaded. You may feel like you are about to pass out (faint). Dizziness can lead to injury if you stumble or fall. Anyone can get dizzy, but dizziness is more common in older adults. This condition can be caused by a number of things, including:  Medicines.  Dehydration.  Illness. Follow these instructions at home: Following these instructions may help with your condition: Eating and drinking  Drink enough fluid to keep your pee (urine) clear or pale yellow. This helps to keep you from getting dehydrated. Try to drink more clear fluids, such as water.  Do not drink alcohol.  Limit how much caffeine you drink or eat if told by your doctor.  Limit how much salt you drink or eat if told by your doctor. Activity  Avoid making quick movements.  When you stand up from sitting in a chair, steady yourself until you feel okay.  In the morning, first sit up on the side of the bed. When you feel okay, stand slowly while you hold onto something. Do this until you know that your balance is fine.  Move your legs often if you need to stand in one place for a long time. Tighten and relax your muscles in your legs while you are standing.  Do not drive or use heavy machinery if you feel dizzy.  Avoid bending down if you feel dizzy. Place items in your home so that they are easy for you to reach without leaning over. Lifestyle  Do not use any tobacco products, including cigarettes, chewing tobacco, or electronic cigarettes. If you need help quitting, ask your doctor.  Try to lower your stress level, such as with yoga or meditation. Talk with your doctor if you need help. General instructions  Watch your dizziness for any changes.  Take medicines only as told by your doctor. Talk with your doctor if you think that your dizziness is caused by a medicine that you are taking.  Tell a friend or a family member that you are  feeling dizzy. If he or she notices any changes in your behavior, have this person call your doctor.  Keep all follow-up visits as told by your doctor. This is important. Contact a doctor if:  Your dizziness does not go away.  Your dizziness or light-headedness gets worse.  You feel sick to your stomach (nauseous).  You have trouble hearing.  You have new symptoms.  You are unsteady on your feet or you feel like the room is spinning. Get help right away if:  You throw up (vomit) or have diarrhea and are unable to eat or drink anything.  You have trouble:  Talking.  Walking.  Swallowing.  Using your arms, hands, or legs.  You feel generally weak.  You are not thinking clearly or you have trouble forming sentences. It may take a friend or family member to notice this.  You have:  Chest pain.  Pain in your belly (abdomen).  Shortness of breath.  Sweating.  Your vision changes.  You are bleeding.  You have a headache.  You have neck pain or a stiff neck.  You have a fever. This information is not intended to replace advice given to you by your health care provider. Make sure you discuss any questions you have with your health care provider. Document Released: 01/16/2011 Document Revised: 07/05/2015 Document Reviewed: 01/23/2014 Elsevier Interactive Patient Education  2017 Elsevier Inc.  

## 2016-01-10 ENCOUNTER — Telehealth: Payer: Self-pay | Admitting: Physician Assistant

## 2016-01-10 NOTE — Telephone Encounter (Signed)
Melissa Arroyo with [facility]/Dr Vaught is asking if pt can start taking HCTZ 12.5mg  1 pill daily for possible Meniere's?  CB#[phone] Ext [phone]/MW

## 2016-01-11 NOTE — Telephone Encounter (Signed)
Yes that should be fine. Her BP was slightly lower in the office when I last saw her. She needs to check her BP after starting because if it drops below 100/60 she will need to decrease her propanolol dose to once daily instead of twice daily.

## 2016-01-11 NOTE — Telephone Encounter (Signed)
Advised patient and Dr. Darien Ramus office.

## 2016-01-11 NOTE — Telephone Encounter (Signed)
Please review. Thanks!  

## 2016-04-02 ENCOUNTER — Ambulatory Visit: Admitting: Physician Assistant

## 2016-04-02 ENCOUNTER — Encounter: Payer: Self-pay | Admitting: Physician Assistant

## 2016-04-02 NOTE — Progress Notes (Deleted)
Patient: Melissa Arroyo Female    DOB: [birth year]   66 y.o.   MRN: [id number] Visit Date: 04/02/2016  Today's Provider: Mar Daring, PA-C   No chief complaint on file.  Subjective:    Vaginal Itching        Allergies  Allergen Reactions  . Penicillins Rash  . Vesicare [Solifenacin] Anaphylaxis    Throat Closed     Current Outpatient Prescriptions:  .  Alcohol Swabs (ALCOHOL PREP) PADS, Use 1 pad prior to checking blood sugar to clean skin, Disp: 100 each, Rfl: 3 .  aspirin EC 81 MG tablet, Take 81 mg by mouth daily. , Disp: , Rfl:  .  Azelastine-Fluticasone (DYMISTA) 137-50 MCG/ACT SUSP, Place 1 spray into the nose 2 (two) times daily. (Patient taking differently: Place 1 spray into the nose 2 (two) times daily as needed. ), Disp: 23 g, Rfl: 0 .  Blood Glucose Monitoring Suppl (FREESTYLE LITE) DEVI, Check fasting blood sugar q a.m., Disp: 1 each, Rfl: 0 .  clonazePAM (KLONOPIN) 1 MG tablet, TAKE ONE TABLET BY MOUTH AT BEDTIME.  MAY TAKE ONE-HALF TABLET EVERY 8 HOURS DURING THE DAY FOR PANIC ATTACKS IF NEEDED, Disp: 180 tablet, Rfl: 1 .  diazepam (VALIUM) 5 MG tablet, Take 1 tablet (5 mg total) by mouth every 12 (twelve) hours as needed. For dizziness, Disp: 60 tablet, Rfl: 1 .  empagliflozin (JARDIANCE) 25 MG TABS tablet, Take 25 mg by mouth daily., Disp: 90 tablet, Rfl: 3 .  FLUARIX QUADRIVALENT 0.5 ML injection, once., Disp: , Rfl:  .  fluticasone (FLONASE) 50 MCG/ACT nasal spray, Place 2 sprays into both nostrils daily., Disp: 16 g, Rfl: 6 .  glipiZIDE (GLUCOTROL XL) 10 MG 24 hr tablet, TAKE 1 TABLET (10 MG) BY ORAL ROUTE ONCE DAILY WITH BREAKFAST, Disp: 90 tablet, Rfl: 3 .  Glucosamine-Chondroitin (MOVE FREE PO), Take 1 tablet by mouth daily., Disp: , Rfl:  .  glucose blood (FREESTYLE LITE) test strip, Check fasting blood sugar q a.m., Disp: 100 each, Rfl: 12 .  ibuprofen (ADVIL,MOTRIN) 200 MG tablet, Take 400 mg by mouth at bedtime., Disp: , Rfl:  .   Lancets (FREESTYLE) lancets, Check fasting blood glucose q a.m, Disp: 100 each, Rfl: 12 .  lovastatin (MEVACOR) 40 MG tablet, Take 1 tablet (40 mg total) by mouth at bedtime., Disp: 90 tablet, Rfl: 3 .  meclizine (ANTIVERT) 25 MG tablet, Take 1 tablet (25 mg total) by mouth 3 (three) times daily as needed for dizziness or nausea., Disp: 30 tablet, Rfl: 1 .  metFORMIN (GLUCOPHAGE) 1000 MG tablet, Take 1 tablet (1,000 mg total) by mouth 2 (two) times daily with a meal., Disp: 180 tablet, Rfl: 3 .  Multiple Vitamins-Minerals (CENTRUM SILVER PO), Take 1 tablet by mouth daily., Disp: , Rfl:  .  nitroGLYCERIN (NITROSTAT) 0.4 MG SL tablet, Place 0.4 mg under the tongue every 5 (five) minutes as needed. , Disp: , Rfl:  .  OLANZapine-FLUoxetine (SYMBYAX) 6-50 MG capsule, Take 1 capsule by mouth daily., Disp: 30 capsule, Rfl: 6 .  PREVNAR 13 SUSP injection, , Disp: , Rfl:  .  propranolol (INDERAL) 10 MG tablet, Take 1 tablet (10 mg total) by mouth 2 (two) times daily., Disp: 180 tablet, Rfl: 3 .  Ubiquinone (ULTRA COQ10 PO), Take 1 tablet by mouth daily., Disp: , Rfl:   Review of Systems  Social History  Substance Use Topics  . Smoking status: Never Smoker  . Smokeless  tobacco: Never Used  . Alcohol use No     Comment: Quit in 04/24/1993   Objective:   There were no vitals taken for this visit.  Physical Exam      Assessment & Plan:           Mar Daring, PA-C  [facility]

## 2016-04-09 ENCOUNTER — Ambulatory Visit (INDEPENDENT_AMBULATORY_CARE_PROVIDER_SITE_OTHER): Admitting: Physician Assistant

## 2016-04-09 ENCOUNTER — Encounter: Payer: Self-pay | Admitting: Physician Assistant

## 2016-04-09 VITALS — BP 120/60 | HR 67 | Temp 97.9°F | Resp 16 | Ht 64.25 in | Wt 179.2 lb

## 2016-04-09 DIAGNOSIS — E119 Type 2 diabetes mellitus without complications: Secondary | ICD-10-CM | POA: Diagnosis not present

## 2016-04-09 LAB — POCT GLYCOSYLATED HEMOGLOBIN (HGB A1C)
Est. average glucose Bld gHb Est-mCnc: 128
Hemoglobin A1C: 6.1

## 2016-04-09 MED ORDER — METFORMIN HCL 500 MG PO TABS: 500.0000 mg | ORAL_TABLET | Freq: Two times a day (BID) | ORAL | 3 refills | Status: DC

## 2016-04-09 NOTE — Patient Instructions (Signed)
Carbohydrate Counting for Diabetes Mellitus, Adult Carbohydrate counting is a method for keeping track of how many carbohydrates you eat. Eating carbohydrates naturally increases the amount of sugar (glucose) in the blood. Counting how many carbohydrates you eat helps keep your blood glucose within normal limits, which helps you manage your diabetes (diabetes mellitus). It is important to know how many carbohydrates you can safely have in each meal. This is different for every person. A diet and nutrition specialist (registered dietitian) can help you make a meal plan and calculate how many carbohydrates you should have at each meal and snack. Carbohydrates are found in the following foods:  Grains, such as breads and cereals.  Dried beans and soy products.  Starchy vegetables, such as potatoes, peas, and corn.  Fruit and fruit juices.  Milk and yogurt.  Sweets and snack foods, such as cake, cookies, candy, chips, and soft drinks. How do I count carbohydrates? There are two ways to count carbohydrates in food. You can use either of the methods or a combination of both. Reading "Nutrition Facts" on packaged food  The "Nutrition Facts" list is included on the labels of almost all packaged foods and beverages in the [place] It includes:  The serving size.  Information about nutrients in each serving, including the grams (g) of carbohydrate per serving. To use the "Nutrition Facts":  Decide how many servings you will have.  Multiply the number of servings by the number of carbohydrates per serving.  The resulting number is the total amount of carbohydrates that you will be having. Learning standard serving sizes of other foods  When you eat foods containing carbohydrates that are not packaged or do not include "Nutrition Facts" on the label, you need to measure the servings in order to count the amount of carbohydrates:  Measure the foods that you will eat with a food scale or measuring  cup, if needed.  Decide how many standard-size servings you will eat.  Multiply the number of servings by 15. Most carbohydrate-rich foods have about 15 g of carbohydrates per serving.  For example, if you eat 8 oz (170 g) of strawberries, you will have eaten 2 servings and 30 g of carbohydrates (2 servings x 15 g = 30 g).  For foods that have more than one food mixed, such as soups and casseroles, you must count the carbohydrates in each food that is included. The following list contains standard serving sizes of common carbohydrate-rich foods. Each of these servings has about 15 g of carbohydrates:   hamburger bun or  English muffin.   oz (15 mL) syrup.   oz (14 g) jelly.  1 slice of bread.  1 six-inch tortilla.  3 oz (85 g) cooked rice or pasta.  4 oz (113 g) cooked dried beans.  4 oz (113 g) starchy vegetable, such as peas, corn, or potatoes.  4 oz (113 g) hot cereal.  4 oz (113 g) mashed potatoes or  of a large baked potato.  4 oz (113 g) canned or frozen fruit.  4 oz (120 mL) fruit juice.  4-6 crackers.  6 chicken nuggets.  6 oz (170 g) unsweetened dry cereal.  6 oz (170 g) plain fat-free yogurt or yogurt sweetened with artificial sweeteners.  8 oz (240 mL) milk.  8 oz (170 g) fresh fruit or one small piece of fruit.  24 oz (680 g) popped popcorn. Example of carbohydrate counting Sample meal  3 oz (85 g) chicken breast.  6 oz (  170 g) brown rice.  4 oz (113 g) corn.  8 oz (240 mL) milk.  8 oz (170 g) strawberries with sugar-free whipped topping. Carbohydrate calculation 1. Identify the foods that contain carbohydrates:  Rice.  Corn.  Milk.  Strawberries. 2. Calculate how many servings you have of each food:  2 servings rice.  1 serving corn.  1 serving milk.  1 serving strawberries. 3. Multiply each number of servings by 15 g:  2 servings rice x 15 g = 30 g.  1 serving corn x 15 g = 15 g.  1 serving milk x 15 g = 15  g.  1 serving strawberries x 15 g = 15 g. 4. Add together all of the amounts to find the total grams of carbohydrates eaten:  30 g + 15 g + 15 g + 15 g = 75 g of carbohydrates total. This information is not intended to replace advice given to you by your health care provider. Make sure you discuss any questions you have with your health care provider. Document Released: 01/27/2005 Document Revised: 08/17/2015 Document Reviewed: 07/11/2015 Elsevier Interactive Patient Education  2017 Elsevier Inc.  

## 2016-04-09 NOTE — Progress Notes (Signed)
Patient: Melissa Arroyo Female    DOB: 08/28/1950   66 y.o.   MRN: [id number] Visit Date: 04/09/2016  Today's Provider: Mar Daring, PA-C   Chief Complaint  Patient presents with  . Diabetes  . Hypertension   Subjective:    HPI  Diabetes Mellitus Type II, Follow-up:   Lab Results  Component Value Date   HGBA1C 6.3% 12/20/2015   HGBA1C 8.1 (H) 09/12/2015   HGBA1C 7.3 (H) 03/22/2015    Last seen for diabetes 3 months ago.  Management since then includes checking A1c. She reports excellent compliance with treatment. She is having side effects. Feeling very tired Current symptoms include "no energy and low sprited" and have been worsening. Home blood sugar records: not being checked daily.  Episodes of hypoglycemia? yes - 79 on 03/19/2016   Current Insulin Regimen: none Most Recent Eye Exam: 2017 Weight trend: stable Prior visit with dietician: no Current diet: in general, a "healthy" diet   Current exercise: walking  Pertinent Labs:    Component Value Date/Time   CHOL 155 09/12/2015 0916   TRIG 152 (H) 09/12/2015 0916   HDL 54 09/12/2015 0916   LDLCALC 71 09/12/2015 0916   CREATININE 0.86 12/27/2015 1039    Wt Readings from Last 3 Encounters:  04/09/16 179 lb 3.2 oz (81.3 kg)  01/02/16 180 lb (81.6 kg)  12/27/15 181 lb (82.1 kg)    ------------------------------------------------------------------------   Hypertension, follow-up:  BP Readings from Last 3 Encounters:  04/09/16 120/60  01/02/16 100/66  12/27/15 (!) 146/83    She was last seen for hypertension 3 months ago.  BP at that visit was 128/96. Management changes since that visit include no changes. She reports excellent compliance with treatment. She is not having side effects.  She is exercising. She is adherent to low salt diet.   Outside blood pressures are not being checked. She is experiencing fatigue.  Patient denies chest pain.   Cardiovascular risk factors  include advanced age (older than 5 for men, 59 for women), diabetes mellitus and hypertension.  Use of agents associated with hypertension: none.     Weight trend: stable Wt Readings from Last 3 Encounters:  04/09/16 179 lb 3.2 oz (81.3 kg)  01/02/16 180 lb (81.6 kg)  12/27/15 181 lb (82.1 kg)    Current diet: in general, a "healthy" diet    ------------------------------------------------------------------------   Anxiety, Follow-up  She  was last seen for this several months ago. Changes made at last visit include no changes.   She reports excellent compliance with treatment. She is not having side effects.   She reports excellent tolerance of treatment. Current symptoms include: none since her retirement from [employer] 03/15/2016. She feels she is Improved since last visit. Patient reports that her therapist recommended to cut back on her symbyax and klonopin. Patient reports that she spoke to her therapist last week.   Depression screen [facility] 2/9 04/09/2016 11/08/2015  Decreased Interest 1 3  Down, Depressed, Hopeless 1 3  PHQ - 2 Score 2 6  Altered sleeping 0 0  Tired, decreased energy 1 0  Change in appetite 0 0  Feeling bad or failure about yourself  0 1  Trouble concentrating 0 0  Moving slowly or fidgety/restless 0 0  Suicidal thoughts 0 0  PHQ-9 Score 3 7  Difficult doing work/chores - Somewhat difficult   ------------------------------------------------------------------------     Allergies  Allergen Reactions  . Penicillins Rash  .  Vesicare [Solifenacin] Anaphylaxis    Throat Closed     Current Outpatient Prescriptions:  .  Alcohol Swabs (ALCOHOL PREP) PADS, Use 1 pad prior to checking blood sugar to clean skin, Disp: 100 each, Rfl: 3 .  aspirin EC 81 MG tablet, Take 81 mg by mouth daily. , Disp: , Rfl:  .  Azelastine-Fluticasone (DYMISTA) 137-50 MCG/ACT SUSP, Place 1 spray into the nose 2 (two) times daily. (Patient taking differently: Place 1 spray  into the nose 2 (two) times daily as needed. ), Disp: 23 g, Rfl: 0 .  Blood Glucose Monitoring Suppl (FREESTYLE LITE) DEVI, Check fasting blood sugar q a.m., Disp: 1 each, Rfl: 0 .  clonazePAM (KLONOPIN) 1 MG tablet, TAKE ONE TABLET BY MOUTH AT BEDTIME.  MAY TAKE ONE-HALF TABLET EVERY 8 HOURS DURING THE DAY FOR PANIC ATTACKS IF NEEDED, Disp: 180 tablet, Rfl: 1 .  empagliflozin (JARDIANCE) 25 MG TABS tablet, Take 25 mg by mouth daily., Disp: 90 tablet, Rfl: 3 .  glipiZIDE (GLUCOTROL XL) 10 MG 24 hr tablet, TAKE 1 TABLET (10 MG) BY ORAL ROUTE ONCE DAILY WITH BREAKFAST, Disp: 90 tablet, Rfl: 3 .  Glucosamine-Chondroitin (MOVE FREE PO), Take 1 tablet by mouth daily., Disp: , Rfl:  .  glucose blood (FREESTYLE LITE) test strip, Check fasting blood sugar q a.m., Disp: 100 each, Rfl: 12 .  hydrochlorothiazide (MICROZIDE) 12.5 MG capsule, Take 12.5 mg by mouth daily., Disp: , Rfl:  .  ibuprofen (ADVIL,MOTRIN) 200 MG tablet, Take 400 mg by mouth at bedtime., Disp: , Rfl:  .  Lancets (FREESTYLE) lancets, Check fasting blood glucose q a.m, Disp: 100 each, Rfl: 12 .  lovastatin (MEVACOR) 40 MG tablet, Take 1 tablet (40 mg total) by mouth at bedtime., Disp: 90 tablet, Rfl: 3 .  metFORMIN (GLUCOPHAGE) 1000 MG tablet, Take 1 tablet (1,000 mg total) by mouth 2 (two) times daily with a meal., Disp: 180 tablet, Rfl: 3 .  Multiple Vitamins-Minerals (CENTRUM SILVER PO), Take 1 tablet by mouth daily., Disp: , Rfl:  .  OLANZapine-FLUoxetine (SYMBYAX) 6-50 MG capsule, Take 1 capsule by mouth daily., Disp: 30 capsule, Rfl: 6 .  propranolol (INDERAL) 10 MG tablet, Take 1 tablet (10 mg total) by mouth 2 (two) times daily., Disp: 180 tablet, Rfl: 3 .  Ubiquinone (ULTRA COQ10 PO), Take 1 tablet by mouth daily., Disp: , Rfl:  .  FLUARIX QUADRIVALENT 0.5 ML injection, once., Disp: , Rfl:  .  nitroGLYCERIN (NITROSTAT) 0.4 MG SL tablet, Place 0.4 mg under the tongue every 5 (five) minutes as needed. , Disp: , Rfl:  .  PREVNAR 13  SUSP injection, , Disp: , Rfl:   Review of Systems  Constitutional: Positive for fatigue.  Respiratory: Negative.   Cardiovascular: Negative.     Social History  Substance Use Topics  . Smoking status: Never Smoker  . Smokeless tobacco: Never Used  . Alcohol use No     Comment: Quit in 04/24/1993   Objective:   BP 120/60 (BP Location: Left Arm, Patient Position: Sitting, Cuff Size: Large)   Pulse 67   Temp 97.9 F (36.6 C) (Oral)   Resp 16   Ht 5' 4.25" (1.632 m)   Wt 179 lb 3.2 oz (81.3 kg)   SpO2 97%   BMI 30.52 kg/m  Vitals:   04/09/16 1642  BP: 120/60  Pulse: 67  Resp: 16  Temp: 97.9 F (36.6 C)  TempSrc: Oral  SpO2: 97%  Weight: 179 lb 3.2 oz (81.3 kg)  Height: 5' 4.25" (1.632 m)     Physical Exam  Constitutional: She appears well-developed and well-nourished. No distress.  Neck: Normal range of motion. Neck supple. No tracheal deviation present. No thyromegaly present.  Cardiovascular: Normal rate, regular rhythm and normal heart sounds.  Exam reveals no gallop and no friction rub.   No murmur heard. Pulmonary/Chest: Effort normal and breath sounds normal. No respiratory distress. She has no wheezes. She has no rales.  Musculoskeletal: She exhibits no edema.  Lymphadenopathy:    She has no cervical adenopathy.  Skin: She is not diaphoretic.  Psychiatric: She has a normal mood and affect. Her behavior is normal. Judgment and thought content normal.  Vitals reviewed.       Assessment & Plan:     1. Type 2 diabetes mellitus without complication, without long-term current use of insulin (HCC) A1c much improved to 6.3. Will decrease metformin to 500mg  BID. She is to continue glipizide and jardiance. When she returns in 3 months if sugar is stable we may discontinue glipizide at that time.  - POCT glycosylated hemoglobin (Hb A1C) - metFORMIN (GLUCOPHAGE) 500 MG tablet; Take 1 tablet (500 mg total) by mouth 2 (two) times daily with a meal.  Dispense: 180  tablet; Refill: [place], PA-C  [facility]

## 2016-04-22 ENCOUNTER — Other Ambulatory Visit: Payer: Self-pay

## 2016-04-22 DIAGNOSIS — F419 Anxiety disorder, unspecified: Secondary | ICD-10-CM

## 2016-04-22 NOTE — Telephone Encounter (Signed)
Refill Request for Clonazepam tab 1MG  #180 R:1

## 2016-04-23 ENCOUNTER — Other Ambulatory Visit: Payer: Self-pay

## 2016-04-23 MED ORDER — CLONAZEPAM 1 MG PO TABS: ORAL_TABLET | ORAL | 1 refills | Status: DC

## 2016-04-23 MED ORDER — HYDROCHLOROTHIAZIDE 12.5 MG PO CAPS: 12.5000 mg | ORAL_CAPSULE | Freq: Every day | ORAL | 3 refills | Status: DC

## 2016-04-23 NOTE — Telephone Encounter (Signed)
[facility] require all controlled substances refill request be faxed to them. They are re faxing the refill request to be completed and faxed back.

## 2016-04-23 NOTE — Telephone Encounter (Signed)
Refill printed 

## 2016-04-23 NOTE — Addendum Note (Signed)
Addended by: Mar Daring on: 04/23/2016 10:06 AM   Modules accepted: Orders

## 2016-04-23 NOTE — Telephone Encounter (Signed)
[facility] requesting medication refill on Hydrochlorothiazide 12.5 mg Qty:90 R:? I can also fax this one with the other Rx.   Thanks,  -Joseline

## 2016-04-28 ENCOUNTER — Telehealth: Payer: Self-pay | Admitting: Physician Assistant

## 2016-04-28 DIAGNOSIS — B379 Candidiasis, unspecified: Secondary | ICD-10-CM

## 2016-04-28 MED ORDER — FLUCONAZOLE 150 MG PO TABS: 150.0000 mg | ORAL_TABLET | Freq: Once | ORAL | 0 refills | Status: AC

## 2016-04-28 NOTE — Telephone Encounter (Signed)
Sent in diflucan. Have patient hold lovastatin while she is taking diflucan to decrease risk of rhabdomyolisis and myalgias.  Thanks.

## 2016-04-28 NOTE — Telephone Encounter (Signed)
Please Review.  Thanks,  -Stephano Arrants 

## 2016-04-28 NOTE — Telephone Encounter (Signed)
Patient was advised a directed below.  Thanks,  -Luian Schumpert

## 2016-04-28 NOTE — Telephone Encounter (Signed)
Pt called saying she has a yeast infection and would like the pills called in.  She has been treating it topically but its not helping much.  She uses [facility]  Thanks Con Memos

## 2016-05-20 ENCOUNTER — Other Ambulatory Visit: Payer: Self-pay | Admitting: Orthopedic Surgery

## 2016-05-20 DIAGNOSIS — M25511 Pain in right shoulder: Secondary | ICD-10-CM

## 2016-05-23 ENCOUNTER — Encounter: Payer: Self-pay | Admitting: Physician Assistant

## 2016-05-23 ENCOUNTER — Ambulatory Visit (INDEPENDENT_AMBULATORY_CARE_PROVIDER_SITE_OTHER): Admitting: Physician Assistant

## 2016-05-23 VITALS — BP 124/70 | HR 76 | Temp 98.2°F | Resp 16 | Wt 180.0 lb

## 2016-05-23 DIAGNOSIS — E119 Type 2 diabetes mellitus without complications: Secondary | ICD-10-CM | POA: Diagnosis not present

## 2016-05-23 DIAGNOSIS — F339 Major depressive disorder, recurrent, unspecified: Secondary | ICD-10-CM

## 2016-05-23 DIAGNOSIS — N941 Unspecified dyspareunia: Secondary | ICD-10-CM

## 2016-05-23 DIAGNOSIS — N952 Postmenopausal atrophic vaginitis: Secondary | ICD-10-CM

## 2016-05-23 MED ORDER — ESTRADIOL 0.1 MG/GM VA CREA: 1.0000 | TOPICAL_CREAM | Freq: Every day | VAGINAL | 12 refills | Status: DC

## 2016-05-23 NOTE — Progress Notes (Signed)
Patient: Melissa Arroyo Female    DOB: [birth year]   66 y.o.   MRN: [id number] Visit Date: 05/23/2016  Today's Provider: Mar Daring, PA-C   Chief Complaint  Patient presents with  . Depression   Subjective:    HPI   Depression Melissa Arroyo is in the office today to discuss her depression. Pt does see psychiatry, but they were unable to see pt until May 21. Pt reports her depression is worsening. Pt is c/o decreased concentration, decreased libido, decreased clitoral sensitivity, and suicidal ideation. Psych decreased pt's Symbax to 3-25 mg. Pt reports the SI occurred 7 days after decreasing the medication. Pt is now taking the Symbax 6-50 again, which is improving her SI (pt reports, "I was able to talk myself out of it. I decided it wasn't worth it."), and slight improvement of the decreased concentration. Pt believes her current problems are stemming from a chemical imbalance which is not being improved by her current medication. Pt is would like to discuss other options, such as Latuda. Pt's psychiatrist started pt on an OTC supplement called Dr. Altamese [employer] Estro capsules for the decreased libido. The ingredients include [place], [place]ia, [place], and [place]. Pt reports the supplement is not improving sx. Pt is also trying a stimulating cream, but she states that it "burns". She does report having increased vaginal dryness that is also causing her to be uncomfortable during intercourse. Pt is also anxious, and her fear of having panic attacks prohibit her from completing errands/chores.  Pt also wanted to discuss sugar cravings. She wakes up with an "intense craving for a McGriddle or pancakes with syrup". Pt is asking if her diabetes medications could be causing these cravings.  Allergies  Allergen Reactions  . Penicillins Rash  . Vesicare [Solifenacin] Anaphylaxis    Throat Closed     Current Outpatient Prescriptions:  .  Alcohol Swabs (ALCOHOL  PREP) PADS, Use 1 pad prior to checking blood sugar to clean skin, Disp: 100 each, Rfl: 3 .  aspirin EC 81 MG tablet, Take 81 mg by mouth daily. , Disp: , Rfl:  .  Azelastine-Fluticasone (DYMISTA) 137-50 MCG/ACT SUSP, Place 1 spray into the nose 2 (two) times daily. (Patient taking differently: Place 1 spray into the nose 2 (two) times daily as needed. ), Disp: 23 g, Rfl: 0 .  Blood Glucose Monitoring Suppl (FREESTYLE LITE) DEVI, Check fasting blood sugar q a.m., Disp: 1 each, Rfl: 0 .  clonazePAM (KLONOPIN) 1 MG tablet, TAKE ONE TABLET BY MOUTH AT BEDTIME.  MAY TAKE ONE-HALF TABLET EVERY 8 HOURS DURING THE DAY FOR PANIC ATTACKS IF NEEDED, Disp: 180 tablet, Rfl: 1 .  empagliflozin (JARDIANCE) 25 MG TABS tablet, Take 25 mg by mouth daily., Disp: 90 tablet, Rfl: 3 .  FLUARIX QUADRIVALENT 0.5 ML injection, once., Disp: , Rfl:  .  glipiZIDE (GLUCOTROL XL) 10 MG 24 hr tablet, TAKE 1 TABLET (10 MG) BY ORAL ROUTE ONCE DAILY WITH BREAKFAST, Disp: 90 tablet, Rfl: 3 .  Glucosamine-Chondroitin (MOVE FREE PO), Take 1 tablet by mouth daily., Disp: , Rfl:  .  glucose blood (FREESTYLE LITE) test strip, Check fasting blood sugar q a.m., Disp: 100 each, Rfl: 12 .  hydrochlorothiazide (MICROZIDE) 12.5 MG capsule, Take 1 capsule (12.5 mg total) by mouth daily., Disp: 90 capsule, Rfl: 3 .  ibuprofen (ADVIL,MOTRIN) 200 MG tablet, Take 400 mg by mouth at bedtime., Disp: , Rfl:  .  Lancets (FREESTYLE) lancets, Check  fasting blood glucose q a.m, Disp: 100 each, Rfl: 12 .  lovastatin (MEVACOR) 40 MG tablet, Take 1 tablet (40 mg total) by mouth at bedtime., Disp: 90 tablet, Rfl: 3 .  metFORMIN (GLUCOPHAGE) 500 MG tablet, Take 1 tablet (500 mg total) by mouth 2 (two) times daily with a meal., Disp: 180 tablet, Rfl: 3 .  Multiple Vitamins-Minerals (CENTRUM SILVER PO), Take 1 tablet by mouth daily., Disp: , Rfl:  .  OLANZapine-FLUoxetine (SYMBYAX) 6-50 MG capsule, Take 1 capsule by mouth daily., Disp: 30 capsule, Rfl: 6 .   PREVNAR 13 SUSP injection, , Disp: , Rfl:  .  propranolol (INDERAL) 10 MG tablet, Take 1 tablet (10 mg total) by mouth 2 (two) times daily., Disp: 180 tablet, Rfl: 3 .  Ubiquinone (ULTRA COQ10 PO), Take 1 tablet by mouth daily., Disp: , Rfl:  .  UNABLE TO FIND, Take 1 capsule by mouth daily. Med Name: Dr. Altamese [employer] Estro caps. Contains Damiana, [place]ia, [place], and [place], Disp: , Rfl:  .  nitroGLYCERIN (NITROSTAT) 0.4 MG SL tablet, Place 0.4 mg under the tongue every 5 (five) minutes as needed. , Disp: , Rfl:   Review of Systems  Constitutional: Positive for activity change, appetite change, fatigue and unexpected weight change. Negative for chills, diaphoresis and fever.  Respiratory: Negative.   Cardiovascular: Negative.   Gastrointestinal: Negative.   Neurological: Negative.   Psychiatric/Behavioral: Positive for agitation and decreased concentration. Negative for self-injury, sleep disturbance and suicidal ideas. The patient is nervous/anxious.     Social History  Substance Use Topics  . Smoking status: Never Smoker  . Smokeless tobacco: Never Used  . Alcohol use No     Comment: Quit in 04/24/1993   Objective:   BP 124/70 (BP Location: Left Arm, Patient Position: Sitting, Cuff Size: Large)   Pulse 76   Temp 98.2 F (36.8 C) (Oral)   Resp 16   Wt 180 lb (81.6 kg)   BMI 30.66 kg/m  Vitals:   05/23/16 1505  BP: 124/70  Pulse: 76  Resp: 16  Temp: 98.2 F (36.8 C)  TempSrc: Oral  Weight: 180 lb (81.6 kg)     Physical Exam  Constitutional: She appears well-developed and well-nourished. No distress.  Neck: Normal range of motion. Neck supple. No JVD present. No tracheal deviation present. No thyromegaly present.  Cardiovascular: Normal rate, regular rhythm and normal heart sounds.  Exam reveals no gallop and no friction rub.   No murmur heard. Pulmonary/Chest: Effort normal and breath sounds normal. No respiratory distress. She has no wheezes. She  has no rales.  Musculoskeletal: She exhibits no edema.  Lymphadenopathy:    She has no cervical adenopathy.  Skin: She is not diaphoretic.  Psychiatric: Her speech is normal and behavior is normal. Judgment and thought content normal. Cognition and memory are normal. She exhibits a depressed mood.  Flat affect  Vitals reviewed.      Assessment & Plan:     1. Vaginal atrophy Will add estrace cream as below. She does still have a uterus so we will try to avoid oral estrogen if able. She feels a lot of her depression and anxiety worsening recently is what is exacerbating her depression symptoms. She is in a new relationship and this is affecting her mentally. Estrace cream added below. She is to call if symptoms worsen.  - estradiol (ESTRACE) 0.1 MG/GM vaginal cream; Place 1 Applicatorful vaginally at bedtime.  Dispense: 42.5 g; Refill: 12  2. Dyspareunia  in female See above medical treatment plan.  3. Type 2 diabetes mellitus without complication, without long-term current use of insulin (HCC) Will discontinue glipizide due to hypoglycemic episodes. Continue metformin 500mg  BID and Jardiance 25mg  daily.   4. Depression, recurrent ([place]) Will see if estrace helps sexual symptoms she is having. She is to follow up with psychiatry for possible medication management.      Patient seen and examined by Mar Daring, PA-C, and note scribed by Renaldo Fiddler, CMA.  Mar Daring, PA-C  [facility]

## 2016-05-23 NOTE — Patient Instructions (Signed)
Estradiol vaginal cream What is this medicine? ESTRADIOL (es tra DYE ole) contains the female hormone estrogen. It is used for symptoms of menopause, like vaginal dryness and irritation. This medicine may be used for other purposes; ask your health care provider or pharmacist if you have questions. COMMON BRAND NAME(S): Estrace What should I tell my health care provider before I take this medicine? They need to know if you have any of these conditions: -abnormal vaginal bleeding -blood vessel disease or blood clots -breast, cervical, endometrial, ovarian, liver, or uterine cancer -dementia -diabetes -gallbladder disease -heart disease or recent heart attack -high blood pressure -high cholesterol -high levels of calcium in the blood -hysterectomy -kidney disease -liver disease -migraine headaches -protein C deficiency -protein S deficiency -stroke -systemic lupus erythematosus (SLE) -tobacco smoker -an unusual or allergic reaction to estrogens, other hormones, soy, other medicines, foods, dyes, or preservatives -pregnant or trying to get pregnant -breast-feeding How should I use this medicine? This medicine is for use in the vagina only. Do not take by mouth. Follow the directions on the prescription label. Read package directions carefully before using. Use the special applicator supplied with the cream. Wash hands before and after use. Fill the applicator with the prescribed amount of cream. Lie on your back, part and bend your knees. Insert the applicator into the vagina and push the plunger to expel the cream into the vagina. Wash the applicator with warm soapy water and rinse well. Use exactly as directed for the complete length of time prescribed. Do not stop using except on the advice of your doctor or health care professional. A patient package insert for the product will be given with each prescription and refill. Read this sheet carefully each time. The sheet may change  frequently. Talk to your pediatrician regarding the use of this medicine in children. This medicine is not approved for use in children. Overdosage: If you think you have taken too much of this medicine contact a poison control center or emergency room at once. NOTE: This medicine is only for you. Do not share this medicine with others. What if I miss a dose? If you miss a dose, use it as soon as you can. If it is almost time for your next dose, use only that dose. Do not use double or extra doses. What may interact with this medicine? Do not take this medicine with any of the following medications: -aromatase inhibitors like aminoglutethimide, anastrozole, exemestane, letrozole, testolactone This medicine may also interact with the following medications: -barbiturates used for inducing sleep or treating seizures -carbamazepine -grapefruit juice -medicines for fungal infections like ketoconazole and itraconazole -raloxifene -rifabutin -rifampin -rifapentine -ritonavir -some antibiotics used to treat infections -St. John's Wort -tamoxifen -warfarin This list may not describe all possible interactions. Give your health care provider a list of all the medicines, herbs, non-prescription drugs, or dietary supplements you use. Also tell them if you smoke, drink alcohol, or use illegal drugs. Some items may interact with your medicine. What should I watch for while using this medicine? Visit your health care professional for regular checks on your progress. You will need a regular breast and pelvic exam. You should also discuss the need for regular mammograms with your health care professional, and follow his or her guidelines. This medicine can make your body retain fluid, making your fingers, hands, or ankles swell. Your blood pressure can go up. Contact your doctor or health care professional if you feel you are retaining fluid. If you have  any reason to think you are pregnant, stop taking  this medicine at once and contact your doctor or health care professional. Tobacco smoking increases the risk of getting a blood clot or having a stroke, especially if you are more than 65 years old. You are strongly advised not to smoke. If you wear contact lenses and notice visual changes, or if the lenses begin to feel uncomfortable, consult your eye care specialist. If you are going to have elective surgery, you may need to stop taking this medicine beforehand. Consult your health care professional for advice prior to scheduling the surgery. What side effects may I notice from receiving this medicine? Side effects that you should report to your doctor or health care professional as soon as possible: -allergic reactions like skin rash, itching or hives, swelling of the face, lips, or tongue -breast tissue changes or discharge -changes in vision -chest pain -confusion, trouble speaking or understanding -dark urine -general ill feeling or flu-like symptoms -light-colored stools -nausea, vomiting -pain, swelling, warmth in the leg -right upper belly pain -severe headaches -shortness of breath -sudden numbness or weakness of the face, arm or leg -trouble walking, dizziness, loss of balance or coordination -unusual vaginal bleeding -yellowing of the eyes or skin Side effects that usually do not require medical attention (report to your doctor or health care professional if they continue or are bothersome): -hair loss -increased hunger or thirst -increased urination -symptoms of vaginal infection like itching, irritation or unusual discharge -unusually weak or tired This list may not describe all possible side effects. Call your doctor for medical advice about side effects. You may report side effects to FDA at 1-800-FDA-1088. Where should I keep my medicine? Keep out of the reach of children. Store at room temperature between 15 and 30 degrees C (59 and 86 degrees F). Protect from  temperatures above 40 degrees C (104 degrees C). Do not freeze. Throw away any unused medicine after the expiration date. NOTE: This sheet is a summary. It may not cover all possible information. If you have questions about this medicine, talk to your doctor, pharmacist, or health care provider.  2018 Elsevier/Gold Standard (2010-05-01 09:18:12)

## 2016-05-26 DIAGNOSIS — F32A Depression, unspecified: Secondary | ICD-10-CM | POA: Insufficient documentation

## 2016-05-26 DIAGNOSIS — F339 Major depressive disorder, recurrent, unspecified: Secondary | ICD-10-CM | POA: Insufficient documentation

## 2016-06-02 ENCOUNTER — Ambulatory Visit
Admission: RE | Admit: 2016-06-02 | Discharge: 2016-06-02 | Disposition: A | Discharge status: Home / Self Care | Source: Ambulatory Visit | Attending: Orthopedic Surgery | Admitting: Orthopedic Surgery

## 2016-06-02 DIAGNOSIS — M25511 Pain in right shoulder: Secondary | ICD-10-CM

## 2016-07-09 ENCOUNTER — Telehealth: Payer: Self-pay | Admitting: Physician Assistant

## 2016-07-09 DIAGNOSIS — B379 Candidiasis, unspecified: Secondary | ICD-10-CM

## 2016-07-09 MED ORDER — FLUCONAZOLE 150 MG PO TABS: 150.0000 mg | ORAL_TABLET | Freq: Once | ORAL | 0 refills | Status: AC

## 2016-07-09 NOTE — Telephone Encounter (Signed)
Pt states she is having itching and burning in her vaginal area for 3 days.  [pt is requesting a Rx to help with this.  [place]  TM#[phone]

## 2016-07-09 NOTE — Telephone Encounter (Signed)
Diflucan sent in to [facility].   She needs to hold lovastatin while she is taking the diflucan up to 3 days after last dose of diflucan.   She will need to be seen if no improvement with treatment.

## 2016-07-09 NOTE — Telephone Encounter (Signed)
LMTCB  Thanks,  -Joseline 

## 2016-07-09 NOTE — Telephone Encounter (Signed)
Patient reports that she has tried Monistat and vagisil with no relief.

## 2016-07-09 NOTE — Telephone Encounter (Signed)
L/M to see if she has tried anything OTC for her symptoms.

## 2016-07-11 NOTE — Telephone Encounter (Signed)
Pt advised-aa 

## 2016-08-18 ENCOUNTER — Encounter: Payer: Self-pay | Admitting: Physician Assistant

## 2016-08-18 ENCOUNTER — Ambulatory Visit: Admitting: Physician Assistant

## 2016-08-18 ENCOUNTER — Ambulatory Visit (INDEPENDENT_AMBULATORY_CARE_PROVIDER_SITE_OTHER): Admitting: Physician Assistant

## 2016-08-18 VITALS — BP 110/80 | HR 64 | Temp 98.0°F | Resp 16 | Ht 65.0 in | Wt 185.0 lb

## 2016-08-18 DIAGNOSIS — E119 Type 2 diabetes mellitus without complications: Secondary | ICD-10-CM

## 2016-08-18 DIAGNOSIS — Z01818 Encounter for other preprocedural examination: Secondary | ICD-10-CM

## 2016-08-18 DIAGNOSIS — M25511 Pain in right shoulder: Secondary | ICD-10-CM | POA: Insufficient documentation

## 2016-08-18 DIAGNOSIS — M62830 Muscle spasm of back: Secondary | ICD-10-CM

## 2016-08-18 LAB — POCT GLYCOSYLATED HEMOGLOBIN (HGB A1C)
Est. average glucose Bld gHb Est-mCnc: 197
Hemoglobin A1C: 8.5

## 2016-08-18 MED ORDER — GLIPIZIDE ER 10 MG PO TB24: 10.0000 mg | ORAL_TABLET | Freq: Every day | ORAL | 3 refills | Status: DC

## 2016-08-18 MED ORDER — METFORMIN HCL 1000 MG PO TABS: 1000.0000 mg | ORAL_TABLET | Freq: Two times a day (BID) | ORAL | 3 refills | Status: DC

## 2016-08-18 MED ORDER — METHOCARBAMOL 500 MG PO TABS: 500.0000 mg | ORAL_TABLET | Freq: Three times a day (TID) | ORAL | 0 refills | Status: DC | PRN

## 2016-08-18 MED ORDER — PREDNISONE 10 MG (21) PO TBPK: ORAL_TABLET | ORAL | 0 refills | Status: DC

## 2016-08-18 NOTE — Patient Instructions (Signed)
Diabetes Mellitus and Food It is important for you to manage your blood sugar (glucose) level. Your blood glucose level can be greatly affected by what you eat. Eating healthier foods in the appropriate amounts throughout the day at about the same time each day will help you control your blood glucose level. It can also help slow or prevent worsening of your diabetes mellitus. Healthy eating may even help you improve the level of your blood pressure and reach or maintain a healthy weight. General recommendations for healthful eating and cooking habits include:  Eating meals and snacks regularly. Avoid going long periods of time without eating to lose weight.  Eating a diet that consists mainly of plant-based foods, such as fruits, vegetables, nuts, legumes, and whole grains.  Using low-heat cooking methods, such as baking, instead of high-heat cooking methods, such as deep frying.  Work with your dietitian to make sure you understand how to use the Nutrition Facts information on food labels. How can food affect me? Carbohydrates Carbohydrates affect your blood glucose level more than any other type of food. Your dietitian will help you determine how many carbohydrates to eat at each meal and teach you how to count carbohydrates. Counting carbohydrates is important to keep your blood glucose at a healthy level, especially if you are using insulin or taking certain medicines for diabetes mellitus. Alcohol Alcohol can cause sudden decreases in blood glucose (hypoglycemia), especially if you use insulin or take certain medicines for diabetes mellitus. Hypoglycemia can be a life-threatening condition. Symptoms of hypoglycemia (sleepiness, dizziness, and disorientation) are similar to symptoms of having too much alcohol. If your health care provider has given you approval to drink alcohol, do so in moderation and use the following guidelines:  Women should not have more than one drink per day, and men  should not have more than two drinks per day. One drink is equal to: ? 12 oz of beer. ? 5 oz of wine. ? 1 oz of hard liquor.  Do not drink on an empty stomach.  Keep yourself hydrated. Have water, diet soda, or unsweetened iced tea.  Regular soda, juice, and other mixers might contain a lot of carbohydrates and should be counted.  What foods are not recommended? As you make food choices, it is important to remember that all foods are not the same. Some foods have fewer nutrients per serving than other foods, even though they might have the same number of calories or carbohydrates. It is difficult to get your body what it needs when you eat foods with fewer nutrients. Examples of foods that you should avoid that are high in calories and carbohydrates but low in nutrients include:  Trans fats (most processed foods list trans fats on the Nutrition Facts label).  Regular soda.  Juice.  Candy.  Sweets, such as cake, pie, doughnuts, and cookies.  Fried foods.  What foods can I eat? Eat nutrient-rich foods, which will nourish your body and keep you healthy. The food you should eat also will depend on several factors, including:  The calories you need.  The medicines you take.  Your weight.  Your blood glucose level.  Your blood pressure level.  Your cholesterol level.  You should eat a variety of foods, including:  Protein. ? Lean cuts of meat. ? Proteins low in saturated fats, such as fish, egg whites, and beans. Avoid processed meats.  Fruits and vegetables. ? Fruits and vegetables that may help control blood glucose levels, such as apples,   mangoes, and yams.  Dairy products. ? Choose fat-free or low-fat dairy products, such as milk, yogurt, and cheese.  Grains, bread, pasta, and rice. ? Choose whole grain products, such as multigrain bread, whole oats, and brown rice. These foods may help control blood pressure.  Fats. ? Foods containing healthful fats, such as  nuts, avocado, olive oil, canola oil, and fish.  Does everyone with diabetes mellitus have the same meal plan? Because every person with diabetes mellitus is different, there is not one meal plan that works for everyone. It is very important that you meet with a dietitian who will help you create a meal plan that is just right for you. This information is not intended to replace advice given to you by your health care provider. Make sure you discuss any questions you have with your health care provider. Document Released: 10/24/2004 Document Revised: 07/05/2015 Document Reviewed: 12/24/2012 Elsevier Interactive Patient Education  2017 Elsevier Inc.  

## 2016-08-18 NOTE — Progress Notes (Signed)
Patient: Melissa Arroyo Female    DOB: [birth year]   66 y.o.   MRN: [id number] Visit Date:  08/18/2016  Today's Provider: Mar Daring, PA-C   Chief Complaint  Patient presents with  . Pre-op Exam   Subjective:    HPI  Patient here today for pre-operative exam. Patient reports that she will undergo right rotator cuff on 09/01/2016 with Dr. Percell Miller. Patient reports she has stopped taking Aspirin 81 mg today. Patient reports tolerating anesthesia well in previous surgeries. Patient denies any fever, cough, chest pain or shortness of breath at this time.  Patient reports she will has an appointment today with Dr. Nehemiah Massed cardiologist.  Patient C/O of back pain left side for three days. Patient denies any injuries or falls. Patient reports that she took Tylenol 1000 mg reports mild improvement. Patient reports that pain is worse with sitting for prolonged time and pain improves after walking for a few minutes.   She is also needing to f/u her diabetes. She reports that she has not been checking her sugars regularly. In February her A1c was at goal at 6.1. Her glipizide was discontinued and metformin decreased. Since she reports she has not been doing well with her dieting. She reports over the last couple of months she has been eating more fruits and ice cream.   She also mentions that the estrace cream has helped her tremendously.     Allergies  Allergen Reactions  . Penicillins Rash  . Vesicare [Solifenacin] Anaphylaxis    Throat Closed     Current Outpatient Prescriptions:  .  Alcohol Swabs (ALCOHOL PREP) PADS, Use 1 pad prior to checking blood sugar to clean skin, Disp: 100 each, Rfl: 3 .  Blood Glucose Monitoring Suppl (FREESTYLE LITE) DEVI, Check fasting blood sugar q a.m., Disp: 1 each, Rfl: 0 .  clonazePAM (KLONOPIN) 1 MG tablet, TAKE ONE TABLET BY MOUTH AT BEDTIME.  MAY TAKE ONE-HALF TABLET EVERY 8 HOURS DURING THE DAY FOR PANIC ATTACKS IF NEEDED, Disp: 180  tablet, Rfl: 1 .  empagliflozin (JARDIANCE) 25 MG TABS tablet, Take 25 mg by mouth daily., Disp: 90 tablet, Rfl: 3 .  estradiol (ESTRACE) 0.1 MG/GM vaginal cream, Place 1 Applicatorful vaginally at bedtime., Disp: 42.5 g, Rfl: 12 .  Glucosamine-Chondroitin (MOVE FREE PO), Take 1 tablet by mouth daily., Disp: , Rfl:  .  glucose blood (FREESTYLE LITE) test strip, Check fasting blood sugar q a.m., Disp: 100 each, Rfl: 12 .  hydrochlorothiazide (MICROZIDE) 12.5 MG capsule, Take 1 capsule (12.5 mg total) by mouth daily., Disp: 90 capsule, Rfl: 3 .  Lancets (FREESTYLE) lancets, Check fasting blood glucose q a.m, Disp: 100 each, Rfl: 12 .  lovastatin (MEVACOR) 40 MG tablet, Take 1 tablet (40 mg total) by mouth at bedtime., Disp: 90 tablet, Rfl: 3 .  metFORMIN (GLUCOPHAGE) 500 MG tablet, Take 1 tablet (500 mg total) by mouth 2 (two) times daily with a meal., Disp: 180 tablet, Rfl: 3 .  OLANZapine-FLUoxetine (SYMBYAX) 6-50 MG capsule, Take 1 capsule by mouth daily., Disp: 30 capsule, Rfl: 6 .  propranolol (INDERAL) 10 MG tablet, Take 1 tablet (10 mg total) by mouth 2 (two) times daily., Disp: 180 tablet, Rfl: 3 .  Ubiquinone (ULTRA COQ10 PO), Take 1 tablet by mouth daily., Disp: , Rfl:  .  aspirin EC 81 MG tablet, Take 81 mg by mouth daily. , Disp: , Rfl:  .  FLUARIX QUADRIVALENT 0.5 ML injection, once., Disp: ,  Rfl:  .  ibuprofen (ADVIL,MOTRIN) 200 MG tablet, Take 400 mg by mouth at bedtime., Disp: , Rfl:  .  Multiple Vitamins-Minerals (CENTRUM SILVER PO), Take 1 tablet by mouth daily., Disp: , Rfl:  .  nitroGLYCERIN (NITROSTAT) 0.4 MG SL tablet, Place 0.4 mg under the tongue every 5 (five) minutes as needed. , Disp: , Rfl:  .  PREVNAR 13 SUSP injection, , Disp: , Rfl:   Review of Systems  Constitutional: Negative.   Respiratory: Negative.   Cardiovascular: Negative.   Gastrointestinal: Negative.   Musculoskeletal: Positive for back pain and myalgias.  Neurological: Negative.     Social History    Substance Use Topics  . Smoking status: Never Smoker  . Smokeless tobacco: Never Used  . Alcohol use No     Comment: Quit in 04/24/1993   Objective:   BP 110/80 (BP Location: Left Arm, Patient Position: Sitting, Cuff Size: Large)   Pulse 64   Temp 98 F (36.7 C) (Oral)   Resp 16   Ht 5\' 5"  (1.651 m)   Wt 185 lb (83.9 kg)   SpO2 99%   BMI 30.79 kg/m  Vitals:   08/18/16 1146  BP: 110/80  Pulse: 64  Resp: 16  Temp: 98 F (36.7 C)  TempSrc: Oral  SpO2: 99%  Weight: 185 lb (83.9 kg)  Height: 5\' 5"  (1.651 m)     Physical Exam  Constitutional: She appears well-developed and well-nourished. No distress.  Neck: Normal range of motion. Neck supple.  Cardiovascular: Normal rate, regular rhythm and normal heart sounds.  Exam reveals no gallop and no friction rub.   No murmur heard. Pulmonary/Chest: Effort normal and breath sounds normal. No respiratory distress. She has no wheezes. She has no rales.  Musculoskeletal:       Thoracic back: Normal.       Lumbar back: She exhibits tenderness and spasm (noted in left paraspinal muscle group). She exhibits normal range of motion (has to move through movements slowly; especially back extension), no bony tenderness and no swelling.  Skin: She is not diaphoretic.  Vitals reviewed.       Assessment & Plan:     1. Type 2 diabetes mellitus without complication, without long-term current use of insulin (HCC) A1c up to 8.5 from 6.1. Will increase metformin back to 1000mg  BID and restart glipizide ER 10mg . She is to continue Jardiance 25mg  daily. I will see her back in 3 months to recheck.  - POCT glycosylated hemoglobin (Hb A1C) - metFORMIN (GLUCOPHAGE) 1000 MG tablet; Take 1 tablet (1,000 mg total) by mouth 2 (two) times daily with a meal.  Dispense: 180 tablet; Refill: 3 - glipiZIDE (GLUCOTROL XL) 10 MG 24 hr tablet; Take 1 tablet (10 mg total) by mouth daily with breakfast.  Dispense: 90 tablet; Refill: 3  2. Pre-op evaluation A1c  elevated. Note changes made. Will check CBC and BMP as below. I will f/u pending results. These will need to [facility] to [facility]. She is having cardiology evaluation and clearance done today as well. EKG will most likely be done by them, thus I did not obtain today. Awaiting cardiac clearance.  - CBC w/Diff/Platelet - Basic Metabolic Panel (BMET)  3. Muscle spasm of back Spasm noted in left paraspinal group. Prednisone given as below. Patient advised to monitor sugar levels as this will increase levels. Robaxin given as muscle relaxer. She is to call if no improvements.  - predniSONE (STERAPRED UNI-PAK 21 TAB) 10 MG (21)  TBPK tablet; Take as directed on package instructions  Dispense: 21 tablet; Refill: 0 - methocarbamol (ROBAXIN) 500 MG tablet; Take 1 tablet (500 mg total) by mouth every 8 (eight) hours as needed for muscle spasms.  Dispense: 30 tablet; Refill: 0       Mar Daring, PA-C  [facility]

## 2016-08-19 LAB — BASIC METABOLIC PANEL
BUN / CREAT RATIO: 24 (ref 12–28)
BUN: 21 mg/dL (ref 8–27)
CHLORIDE: 98 mmol/L (ref 96–106)
CO2: 24 mmol/L (ref 20–29)
Calcium: 9.9 mg/dL (ref 8.7–10.3)
Creatinine, Ser: 0.86 mg/dL (ref 0.57–1.00)
GFR, EST AFRICAN AMERICAN: 83 mL/min/{1.73_m2} (ref >59)
GFR, EST NON AFRICAN AMERICAN: 72 mL/min/{1.73_m2} (ref >59)
Glucose: 193 mg/dL — ABNORMAL HIGH (ref 65–99)
Potassium: 4.7 mmol/L (ref 3.5–5.2)
Sodium: 139 mmol/L (ref 134–144)

## 2016-08-19 LAB — CBC WITH DIFFERENTIAL/PLATELET
BASOS: 1 %
Basophils Absolute: 0.1 10*3/uL (ref 0.0–0.2)
EOS (ABSOLUTE): 0.3 10*3/uL (ref 0.0–0.4)
Eos: 3 %
HEMATOCRIT: 43.2 % (ref 34.0–46.6)
Hemoglobin: 14.1 g/dL (ref 11.1–15.9)
Immature Grans (Abs): 0 10*3/uL (ref 0.0–0.1)
Immature Granulocytes: 0 %
Lymphocytes Absolute: 4.2 10*3/uL — ABNORMAL HIGH (ref 0.7–3.1)
Lymphs: 40 %
MCH: 31.3 pg (ref 26.6–33.0)
MCHC: 32.6 g/dL (ref 31.5–35.7)
MCV: 96 fL (ref 79–97)
MONOS ABS: 0.5 10*3/uL (ref 0.1–0.9)
Monocytes: 5 %
NEUTROS ABS: 5.4 10*3/uL (ref 1.4–7.0)
Neutrophils: 51 %
PLATELETS: 312 10*3/uL (ref 150–379)
RBC: 4.51 x10E6/uL (ref 3.77–5.28)
RDW: 14 % (ref 12.3–15.4)
WBC: 10.5 10*3/uL (ref 3.4–10.8)

## 2016-09-18 ENCOUNTER — Encounter: Payer: Self-pay | Admitting: Physician Assistant

## 2016-09-18 ENCOUNTER — Ambulatory Visit (INDEPENDENT_AMBULATORY_CARE_PROVIDER_SITE_OTHER): Admitting: Physician Assistant

## 2016-09-18 ENCOUNTER — Telehealth: Payer: Self-pay | Admitting: Emergency Medicine

## 2016-09-18 VITALS — BP 118/72 | HR 70 | Temp 97.8°F | Resp 16 | Wt 179.4 lb

## 2016-09-18 DIAGNOSIS — L237 Allergic contact dermatitis due to plants, except food: Secondary | ICD-10-CM

## 2016-09-18 DIAGNOSIS — L0100 Impetigo, unspecified: Secondary | ICD-10-CM | POA: Diagnosis not present

## 2016-09-18 DIAGNOSIS — N952 Postmenopausal atrophic vaginitis: Secondary | ICD-10-CM | POA: Diagnosis not present

## 2016-09-18 MED ORDER — ESTRADIOL 0.1 MG/GM VA CREA: 1.0000 | TOPICAL_CREAM | Freq: Every day | VAGINAL | 12 refills | Status: DC

## 2016-09-18 MED ORDER — PREDNISONE 10 MG (21) PO TBPK: ORAL_TABLET | ORAL | 0 refills | Status: DC

## 2016-09-18 MED ORDER — PREDNISONE 10 MG (21) PO TBPK: ORAL_TABLET | ORAL | 1 refills | Status: DC

## 2016-09-18 NOTE — Patient Instructions (Signed)
Impetigo, Adult Impetigo is an infection of the skin. It commonly occurs in young children, but it can also occur in adults. The infection causes itchy blisters and sores that produce brownish-yellow fluid. As the fluid dries, it forms a thick, honey-colored crust. These skin changes usually occur on the face but can also affect other areas of the body. Impetigo usually goes away in 7-10 days with treatment. What are the causes? Impetigo is caused by two types of bacteria. It may be caused by staphylococci or streptococci bacteria. These bacteria cause impetigo when they get under the surface of the skin. This often happens after some damage to the skin, such as damage from:  Cuts, scrapes, or scratches.  Insect bites, especially when you scratch the area of a bite.  Chickenpox or other illnesses that cause open skin sores.  Nail biting or chewing.  Impetigo is contagious and can spread easily from one person to another. This may occur through close skin contact or by sharing towels, clothing, or other items with a person who has the infection. What increases the risk? Some things that can increase the risk of getting this infection include:  Playing sports that include skin-to-skin contact with others.  Having a skin condition with open sores.  Having many skin cuts or scrapes.  Living in an area that has high humidity levels.  Having poor hygiene.  Having high levels of staphylococci in your nose.  What are the signs or symptoms? Impetigo usually starts out as small blisters, often on the face. The blisters then break open and turn into tiny sores (lesions) with a yellow crust. In some cases, the blisters cause itching or burning. With scratching, irritation, or lack of treatment, these small lesions may get larger. Scratching can also cause impetigo to spread to other parts of the body. The bacteria can get under the fingernails and spread when you touch another area of your  skin. Other possible symptoms include:  Larger blisters.  Pus.  Swollen lymph glands.  How is this diagnosed? This condition is usually diagnosed during a physical exam. A skin sample or sample of fluid from a blister may be taken for lab tests that involve growing bacteria (culture test). This can help confirm the diagnosis or help determine the best treatment. How is this treated? Mild impetigo can be treated with prescription antibiotic cream. Oral antibiotic medicine may be used in more severe cases. Medicines for itching may also be used. Follow these instructions at home:  Take medicines only as directed by your health care provider.  To help prevent impetigo from spreading to other body areas: ? Keep your fingernails short and clean. ? Do not scratch the blisters or sores. ? Cover infected areas, if necessary, to keep from scratching.  Gently wash the infected areas with antibiotic soap and water.  Soak crusted areas in warm, soapy water using antibiotic soap. ? Gently rub the areas to remove crusts. Do not scrub.  Wash your hands often to avoid spreading this infection.  Stay home until you have used an antibiotic cream for 48 hours (2 days) or an oral antibiotic medicine for 24 hours (1 day). You should only return to work and activities with other people if your skin shows significant improvement. How is this prevented? To keep the infection from spreading:  Stay home until you have used an antibiotic cream for 48 hours or an oral antibiotic for 24 hours.  Wash your hands often.  Do not engage in   skin-to-skin contact with other people while you have still have blisters.  Do not share towels, washcloths, or bedding with others while you have the infection.  Contact a health care provider if:  You develop more blisters or sores despite treatment.  Other family members get sores.  Your skin sores are not improving after 48 hours of treatment.  You have a  fever. Get help right away if:  You see spreading redness or swelling of the skin around your sores.  You see red streaks coming from your sores.  You develop a sore throat. This information is not intended to replace advice given to you by your health care provider. Make sure you discuss any questions you have with your health care provider. Document Released: 02/17/2014 Document Revised: 07/05/2015 Document Reviewed: 01/10/2014 Elsevier Interactive Patient Education  2017 Elsevier Inc.  

## 2016-09-18 NOTE — Telephone Encounter (Signed)
error 

## 2016-09-18 NOTE — Progress Notes (Signed)
Patient: Melissa Arroyo Female    DOB: [birth year]   66 y.o.   MRN: [id number] Visit Date: 09/18/2016  Today's Provider: Mar Daring, PA-C   Chief Complaint  Patient presents with  . Rash   Subjective:    Rash  This is a new problem. The current episode started in the past 7 days (2 days ago). The problem has been gradually worsening since onset. The affected locations include the face [name]). The rash is characterized by blistering, scaling, redness, swelling and draining. She was exposed to plant contact. Pertinent negatives include no congestion, cough, diarrhea, eye pain, fatigue, fever, rhinorrhea, shortness of breath, sore throat or vomiting. Treatments tried: topical wash-poison ivy wash. The treatment provided no relief.       Allergies  Allergen Reactions  . Penicillins Rash  . Vesicare [Solifenacin] Anaphylaxis    Throat Closed     Current Outpatient Prescriptions:  .  Alcohol Swabs (ALCOHOL PREP) PADS, Use 1 pad prior to checking blood sugar to clean skin, Disp: 100 each, Rfl: 3 .  aspirin EC 81 MG tablet, Take 81 mg by mouth daily. , Disp: , Rfl:  .  Blood Glucose Monitoring Suppl (FREESTYLE LITE) DEVI, Check fasting blood sugar q a.m., Disp: 1 each, Rfl: 0 .  clonazePAM (KLONOPIN) 1 MG tablet, TAKE ONE TABLET BY MOUTH AT BEDTIME.  MAY TAKE ONE-HALF TABLET EVERY 8 HOURS DURING THE DAY FOR PANIC ATTACKS IF NEEDED, Disp: 180 tablet, Rfl: 1 .  empagliflozin (JARDIANCE) 25 MG TABS tablet, Take 25 mg by mouth daily., Disp: 90 tablet, Rfl: 3 .  estradiol (ESTRACE) 0.1 MG/GM vaginal cream, Place 1 Applicatorful vaginally at bedtime., Disp: 42.5 g, Rfl: 12 .  glipiZIDE (GLUCOTROL XL) 10 MG 24 hr tablet, Take 1 tablet (10 mg total) by mouth daily with breakfast., Disp: 90 tablet, Rfl: 3 .  Glucosamine-Chondroitin (MOVE FREE PO), Take 1 tablet by mouth daily., Disp: , Rfl:  .  glucose blood (FREESTYLE LITE) test strip, Check fasting blood sugar q a.m., Disp: 100  each, Rfl: 12 .  hydrochlorothiazide (MICROZIDE) 12.5 MG capsule, Take 1 capsule (12.5 mg total) by mouth daily., Disp: 90 capsule, Rfl: 3 .  ibuprofen (ADVIL,MOTRIN) 200 MG tablet, Take 400 mg by mouth at bedtime., Disp: , Rfl:  .  Lancets (FREESTYLE) lancets, Check fasting blood glucose q a.m, Disp: 100 each, Rfl: 12 .  lovastatin (MEVACOR) 40 MG tablet, Take 1 tablet (40 mg total) by mouth at bedtime., Disp: 90 tablet, Rfl: 3 .  metFORMIN (GLUCOPHAGE) 1000 MG tablet, Take 1 tablet (1,000 mg total) by mouth 2 (two) times daily with a meal., Disp: 180 tablet, Rfl: 3 .  Multiple Vitamins-Minerals (CENTRUM SILVER PO), Take 1 tablet by mouth daily., Disp: , Rfl:  .  OLANZapine-FLUoxetine (SYMBYAX) 6-50 MG capsule, Take 1 capsule by mouth daily., Disp: 30 capsule, Rfl: 6 .  oxyCODONE-acetaminophen (PERCOCET/ROXICET) 5-325 MG tablet, TK 1 T PO Q 4 TO 6 H PRN P, Disp: , Rfl: 0 .  propranolol (INDERAL) 10 MG tablet, Take 1 tablet (10 mg total) by mouth 2 (two) times daily., Disp: 180 tablet, Rfl: 3 .  Ubiquinone (ULTRA COQ10 PO), Take 1 tablet by mouth daily., Disp: , Rfl:  .  FLUARIX QUADRIVALENT 0.5 ML injection, once., Disp: , Rfl:  .  methocarbamol (ROBAXIN) 500 MG tablet, Take 1 tablet (500 mg total) by mouth every 8 (eight) hours as needed for muscle spasms. (Patient not taking: Reported  on 09/18/2016), Disp: 30 tablet, Rfl: 0 .  nitroGLYCERIN (NITROSTAT) 0.4 MG SL tablet, Place 0.4 mg under the tongue every 5 (five) minutes as needed. , Disp: , Rfl:  .  predniSONE (STERAPRED UNI-PAK 21 TAB) 10 MG (21) TBPK tablet, Take as directed on package instructions (Patient not taking: Reported on 09/18/2016), Disp: 21 tablet, Rfl: 0 .  PREVNAR 13 SUSP injection, , Disp: , Rfl:   Review of Systems  Constitutional: Negative for fatigue and fever.  HENT: Negative for congestion, rhinorrhea and sore throat.   Eyes: Negative for pain.  Respiratory: Negative for cough and shortness of breath.   Gastrointestinal:  Negative for diarrhea and vomiting.  Skin: Positive for rash.    Social History  Substance Use Topics  . Smoking status: Never Smoker  . Smokeless tobacco: Never Used  . Alcohol use No     Comment: Quit in 04/24/1993   Objective:   BP 118/72   Pulse 70   Temp 97.8 F (36.6 C) (Oral)   Resp 16   Wt 179 lb 6.4 oz (81.4 kg)   BMI 29.85 kg/m     Physical Exam  Constitutional: She appears well-developed and well-nourished. No distress.  Neck: Normal range of motion. Neck supple.  Cardiovascular: Normal rate, regular rhythm and normal heart sounds.  Exam reveals no gallop and no friction rub.   No murmur heard. Pulmonary/Chest: Effort normal and breath sounds normal. No respiratory distress. She has no wheezes. She has no rales.  Skin: Rash noted. Rash is vesicular. She is not diaphoretic.     Vitals reviewed.       Assessment & Plan:     1. Poison oak Lesion started as poison oak but patient scratched and developed secondary bacterial infection. Prednisone given as below for poison oak dermatitis and advised patient to use OTC antibiotic cream for the secondary bacterial infection. She voices understanding and is to call if symptoms worsen. - predniSONE (STERAPRED UNI-PAK 21 TAB) 10 MG (21) TBPK tablet; Take as directed on package instructions  Dispense: 21 tablet; Refill: 1  2. Impetigo See above medical treatment plan.  3. Vaginal atrophy Stable. Diagnosis pulled for medication refill. Continue current medical treatment plan. - estradiol (ESTRACE) 0.1 MG/GM vaginal cream; Place 1 Applicatorful vaginally at bedtime.  Dispense: 42.5 g; Refill: [place], PA-C  [facility]

## 2016-09-22 ENCOUNTER — Other Ambulatory Visit: Payer: Self-pay | Admitting: Physician Assistant

## 2016-09-22 DIAGNOSIS — L237 Allergic contact dermatitis due to plants, except food: Secondary | ICD-10-CM

## 2016-09-30 ENCOUNTER — Other Ambulatory Visit: Payer: Self-pay | Admitting: Physician Assistant

## 2016-09-30 DIAGNOSIS — N952 Postmenopausal atrophic vaginitis: Secondary | ICD-10-CM

## 2016-09-30 MED ORDER — ESTRADIOL 0.1 MG/GM VA CREA: 1.0000 | TOPICAL_CREAM | Freq: Every day | VAGINAL | 12 refills | Status: DC

## 2016-09-30 NOTE — Telephone Encounter (Signed)
[facility] faxed a refill request on the following medications:  estradiol (ESTRACE) 0.1 MG/GM vaginal cream.  90 day supply.    [facility]

## 2016-09-30 NOTE — Telephone Encounter (Signed)
[facility]

## 2016-10-20 ENCOUNTER — Other Ambulatory Visit: Payer: Self-pay | Admitting: Physician Assistant

## 2016-10-20 DIAGNOSIS — E119 Type 2 diabetes mellitus without complications: Secondary | ICD-10-CM

## 2016-10-20 DIAGNOSIS — I10 Essential (primary) hypertension: Secondary | ICD-10-CM

## 2016-10-20 DIAGNOSIS — E7800 Pure hypercholesterolemia, unspecified: Secondary | ICD-10-CM

## 2016-10-20 MED ORDER — LOVASTATIN 40 MG PO TABS: 40.0000 mg | ORAL_TABLET | Freq: Every day | ORAL | 3 refills | Status: DC

## 2016-10-20 MED ORDER — PROPRANOLOL HCL 10 MG PO TABS: 10.0000 mg | ORAL_TABLET | Freq: Two times a day (BID) | ORAL | 3 refills | Status: DC

## 2016-10-20 MED ORDER — EMPAGLIFLOZIN 25 MG PO TABS: 25.0000 mg | ORAL_TABLET | Freq: Every day | ORAL | 3 refills | Status: DC

## 2016-10-20 NOTE — Addendum Note (Signed)
Addended by: Mar Daring on: 10/20/2016 09:50 AM   Modules accepted: Orders

## 2016-10-22 ENCOUNTER — Telehealth: Payer: Self-pay | Admitting: Physician Assistant

## 2016-10-22 NOTE — Telephone Encounter (Signed)
Pt states she is going to change insurance to Medicare on 01/24/17 and trying to figure out which supplement to take. Pt states the Rx empagliflozin (JARDIANCE) 25 MG TABS tablet  for is not going to be covered.  Pt is asking if she can switch a different Rx so insurance will cover.  WL#[phone]/[id number]

## 2016-10-22 NOTE — Telephone Encounter (Signed)
Please Review.  Thanks,  -Joseline 

## 2016-10-22 NOTE — Telephone Encounter (Signed)
LM as directed below.  Thanks,  -Nansi [place] 

## 2016-10-22 NOTE — Telephone Encounter (Signed)
We should be able to get Jardiance covered with a PA once she switches to Medicare.

## 2016-11-20 ENCOUNTER — Ambulatory Visit: Admitting: Physician Assistant

## 2016-11-21 ENCOUNTER — Encounter: Payer: Self-pay | Admitting: Physician Assistant

## 2016-11-21 ENCOUNTER — Ambulatory Visit (INDEPENDENT_AMBULATORY_CARE_PROVIDER_SITE_OTHER): Admitting: Physician Assistant

## 2016-11-21 VITALS — BP 102/72 | HR 76 | Temp 97.9°F | Resp 14 | Wt 184.2 lb

## 2016-11-21 DIAGNOSIS — Z780 Asymptomatic menopausal state: Secondary | ICD-10-CM | POA: Diagnosis not present

## 2016-11-21 DIAGNOSIS — I10 Essential (primary) hypertension: Secondary | ICD-10-CM

## 2016-11-21 DIAGNOSIS — R5383 Other fatigue: Secondary | ICD-10-CM

## 2016-11-21 DIAGNOSIS — E119 Type 2 diabetes mellitus without complications: Secondary | ICD-10-CM

## 2016-11-21 DIAGNOSIS — M12811 Other specific arthropathies, not elsewhere classified, right shoulder: Secondary | ICD-10-CM | POA: Diagnosis not present

## 2016-11-21 DIAGNOSIS — F339 Major depressive disorder, recurrent, unspecified: Secondary | ICD-10-CM

## 2016-11-21 DIAGNOSIS — E782 Mixed hyperlipidemia: Secondary | ICD-10-CM | POA: Diagnosis not present

## 2016-11-21 LAB — POCT GLYCOSYLATED HEMOGLOBIN (HGB A1C): Hemoglobin A1C: 6.5

## 2016-11-21 LAB — POCT UA - MICROALBUMIN: Microalbumin Ur, POC: 50 mg/L

## 2016-11-21 MED ORDER — METFORMIN HCL 1000 MG PO TABS: 500.0000 mg | ORAL_TABLET | Freq: Two times a day (BID) | ORAL | 3 refills | Status: DC

## 2016-11-21 NOTE — Patient Instructions (Addendum)
Decrease Metformin 500 mg twice daily Continue glipizide 10mg  once daily Continue Jardiance 25mg  daily Stop HCTZ 12.5mg   Decrease Propanolol 5mg  twice daily Recheck BP in one week and send  message with reading Also let me know in 1 week if morning sugar lows are improving

## 2016-11-21 NOTE — Progress Notes (Signed)
Patient: Melissa Arroyo Female    DOB: Aug 29, 1950   66 y.o.   MRN: [id number] Visit Date: 11/21/2016  Today's Provider: Mar Daring, PA-C   Chief Complaint  Patient presents with  . Diabetes   Subjective:    HPI Patient is here for follow up. DM: patient is not checking her sugar at home. No numbness or tingling sensation present. Patient needs an eye exam, last one was in 2016 per patient.  Wt Readings from Last 3 Encounters:  11/21/16 184 lb 3.2 oz (83.6 kg)  09/18/16 179 lb 6.4 oz (81.4 kg)  08/18/16 185 lb (83.9 kg)   Lab Results  Component Value Date   HGBA1C 8.5 08/18/2016   HTN: patient has cut back on Propranolol dose from 10 mg Bid to 5 mg BID.  BP Readings from Last 3 Encounters:  11/21/16 102/72  09/18/16 118/72  08/18/16 110/80   Patient states she has cut back on Clonazepam also to 1/2 tablet at bedtime.  Patient has not felt well the past 6 weeks symptoms are fatigue, "can not think straight," nausea, unsteady feeling, shortness of breath with walking.  Depression screen [facility] 2/9 11/21/2016 04/09/2016 11/08/2015  Decreased Interest 3 1 3   Down, Depressed, Hopeless 1 1 3   PHQ - 2 Score 4 2 6   Altered sleeping 0 0 0  Tired, decreased energy 3 1 0  Change in appetite 1 0 0  Feeling bad or failure about yourself  0 0 1  Trouble concentrating 3 0 0  Moving slowly or fidgety/restless 1 0 0  Suicidal thoughts 0 0 0  PHQ-9 Score 12 3 7   Difficult doing work/chores Very difficult - Somewhat difficult      Allergies  Allergen Reactions  . Penicillins Rash  . Vesicare [Solifenacin] Anaphylaxis    Throat Closed     Current Outpatient Prescriptions:  .  Alcohol Swabs (ALCOHOL PREP) PADS, Use 1 pad prior to checking blood sugar to clean skin, Disp: 100 each, Rfl: 3 .  aspirin EC 81 MG tablet, Take 81 mg by mouth daily. , Disp: , Rfl:  .  Blood Glucose Monitoring Suppl (FREESTYLE LITE) DEVI, Check fasting blood sugar q a.m., Disp: 1 each,  Rfl: 0 .  clonazePAM (KLONOPIN) 1 MG tablet, TAKE ONE TABLET BY MOUTH AT BEDTIME.  MAY TAKE ONE-HALF TABLET EVERY 8 HOURS DURING THE DAY FOR PANIC ATTACKS IF NEEDED, Disp: 180 tablet, Rfl: 1 .  empagliflozin (JARDIANCE) 25 MG TABS tablet, Take 25 mg by mouth daily., Disp: 90 tablet, Rfl: 3 .  estradiol (ESTRACE) 0.1 MG/GM vaginal cream, Place 1 Applicatorful vaginally at bedtime., Disp: 42.5 g, Rfl: 12 .  glipiZIDE (GLUCOTROL XL) 10 MG 24 hr tablet, Take 1 tablet (10 mg total) by mouth daily with breakfast., Disp: 90 tablet, Rfl: 3 .  Glucosamine-Chondroitin (MOVE FREE PO), Take 1 tablet by mouth daily., Disp: , Rfl:  .  glucose blood (FREESTYLE LITE) test strip, Check fasting blood sugar q a.m., Disp: 100 each, Rfl: 12 .  hydrochlorothiazide (MICROZIDE) 12.5 MG capsule, Take 1 capsule (12.5 mg total) by mouth daily., Disp: 90 capsule, Rfl: 3 .  ibuprofen (ADVIL,MOTRIN) 200 MG tablet, Take 400 mg by mouth at bedtime., Disp: , Rfl:  .  Lancets (FREESTYLE) lancets, Check fasting blood glucose q a.m, Disp: 100 each, Rfl: 12 .  lovastatin (MEVACOR) 40 MG tablet, Take 1 tablet (40 mg total) by mouth at bedtime., Disp: 90 tablet, Rfl: 3 .  metFORMIN (GLUCOPHAGE) 1000 MG tablet, Take 1 tablet (1,000 mg total) by mouth 2 (two) times daily with a meal., Disp: 180 tablet, Rfl: 3 .  Multiple Vitamins-Minerals (CENTRUM SILVER PO), Take 1 tablet by mouth daily., Disp: , Rfl:  .  OLANZapine-FLUoxetine (SYMBYAX) 6-50 MG capsule, Take 1 capsule by mouth daily., Disp: 30 capsule, Rfl: 6 .  PREVNAR 13 SUSP injection, , Disp: , Rfl:  .  propranolol (INDERAL) 10 MG tablet, Take 1 tablet (10 mg total) by mouth 2 (two) times daily. (Patient taking differently: Take 5 mg by mouth 2 (two) times daily. ), Disp: 180 tablet, Rfl: 3 .  Ubiquinone (ULTRA COQ10 PO), Take 1 tablet by mouth daily., Disp: , Rfl:  .  nitroGLYCERIN (NITROSTAT) 0.4 MG SL tablet, Place 0.4 mg under the tongue every 5 (five) minutes as needed. , Disp: ,  Rfl:   Review of Systems  Constitutional: Positive for activity change and fatigue.  Respiratory: Positive for shortness of breath.   Gastrointestinal: Positive for nausea.  Musculoskeletal: Positive for arthralgias.  Neurological: Positive for dizziness and weakness.       Unsteady    Social History  Substance Use Topics  . Smoking status: Never Smoker  . Smokeless tobacco: Never Used  . Alcohol use No     Comment: Quit in 04/24/1993   Objective:   BP 102/72   Pulse 76   Temp 97.9 F (36.6 C)   Resp 14   Wt 184 lb 3.2 oz (83.6 kg)   BMI 30.65 kg/m    Physical Exam  Constitutional: She appears well-developed and well-nourished. No distress.  Neck: Normal range of motion. Neck supple. No JVD present. No tracheal deviation present. No thyromegaly present.  Cardiovascular: Normal rate, regular rhythm and normal heart sounds.  Exam reveals no gallop and no friction rub.   No murmur heard. Pulmonary/Chest: Effort normal and breath sounds normal. No respiratory distress. She has no wheezes. She has no rales.  Lymphadenopathy:    She has no cervical adenopathy.  Skin: She is not diaphoretic.  Vitals reviewed.  Diabetic Foot Exam - Simple   Simple Foot Form Diabetic Foot exam was performed with the following findings:  Yes 11/21/2016 12:51 PM  Visual Inspection No deformities, no ulcerations, no other skin breakdown bilaterally:  Yes Sensation Testing Intact to touch and monofilament testing bilaterally:  Yes Pulse Check Posterior Tibialis and Dorsalis pulse intact bilaterally:  Yes Comments       Assessment & Plan:      1. Type 2 diabetes mellitus without complication, without long-term current use of insulin (HCC) A1c down to 6.5. Microalbumin at 50. Will decrease metformin to 500mg  BID. Continue glipizide 10mg  and jardiance 25mg . I will see her back in 3 months for initial medicare. - POCT UA - Microalbumin - POCT HgB A1C - metFORMIN (GLUCOPHAGE) 1000 MG tablet;  Take 0.5 tablets (500 mg total) by mouth 2 (two) times daily with a meal.  Dispense: 180 tablet; Refill: 3 - CBC w/Diff/Platelet - COMPLETE METABOLIC PANEL WITH GFR  2. Benign essential HTN Low today. Continue propanolol 5mg  BID. D/C HCTZ. She is to call the office in 1 week with BP reading. If still low will go to propanolol 5mg  once daily. - CBC w/Diff/Platelet - COMPLETE METABOLIC PANEL WITH GFR  3. Combined fat and carbohydrate induced hyperlipemia Will check labs as below and f/u pending results. - CBC w/Diff/Platelet - COMPLETE METABOLIC PANEL WITH GFR - Lipid Profile  4. Depression, recurrent (  [place]) Followed by psychiatry and also sees counselor.   5. Rotator cuff arthropathy of right shoulder Still having pain in right shoulder. Reports she stopped going to PT due to pain.   6. Fatigue, unspecified type Will check labs as below and f/u pending results. - TSH - Fe+TIBC+Fer - Vitamin D (25 hydroxy)  7. Postmenopausal Will check labs as below and f/u pending results. - Vitamin D (25 hydroxy)      Mar Daring, PA-C  [facility]

## 2016-11-22 LAB — CBC WITH DIFFERENTIAL/PLATELET
BASOS PCT: 0.8 %
Basophils Absolute: 67 cells/uL (ref 0–200)
EOS ABS: 403 {cells}/uL (ref 15–500)
Eosinophils Relative: 4.8 %
HCT: 41.5 % (ref 35.0–45.0)
HEMOGLOBIN: 14 g/dL (ref 11.7–15.5)
Lymphs Abs: 3200 cells/uL (ref 850–3900)
MCH: 30.8 pg (ref 27.0–33.0)
MCHC: 33.7 g/dL (ref 32.0–36.0)
MCV: 91.2 fL (ref 80.0–100.0)
MONOS PCT: 5.8 %
MPV: 11.1 fL (ref 7.5–12.5)
NEUTROS ABS: 4242 {cells}/uL (ref 1500–7800)
Neutrophils Relative %: 50.5 %
PLATELETS: 356 10*3/uL (ref 140–400)
RBC: 4.55 10*6/uL (ref 3.80–5.10)
RDW: 13.1 % (ref 11.0–15.0)
TOTAL LYMPHOCYTE: 38.1 %
WBC: 8.4 10*3/uL (ref 3.8–10.8)
WBCMIX: 487 {cells}/uL (ref 200–950)

## 2016-11-22 LAB — COMPLETE METABOLIC PANEL WITH GFR
AG Ratio: 1.5 (calc) (ref 1.0–2.5)
ALBUMIN MSPROF: 4.5 g/dL (ref 3.6–5.1)
ALT: 21 U/L (ref 6–29)
AST: 19 U/L (ref 10–35)
Alkaline phosphatase (APISO): 72 U/L (ref 33–130)
BUN: 18 mg/dL (ref 7–25)
CALCIUM: 10.1 mg/dL (ref 8.6–10.4)
CHLORIDE: 101 mmol/L (ref 98–110)
CO2: 29 mmol/L (ref 20–32)
CREATININE: 0.76 mg/dL (ref 0.50–0.99)
GFR, EST AFRICAN AMERICAN: 96 mL/min/{1.73_m2} (ref >=60)
GFR, EST NON AFRICAN AMERICAN: 83 mL/min/{1.73_m2} (ref >=60)
Globulin: 3.1 g/dL (calc) (ref 1.9–3.7)
Glucose, Bld: 122 mg/dL — ABNORMAL HIGH (ref 65–99)
Potassium: 4.4 mmol/L (ref 3.5–5.3)
Sodium: 139 mmol/L (ref 135–146)
TOTAL PROTEIN: 7.6 g/dL (ref 6.1–8.1)
Total Bilirubin: 0.4 mg/dL (ref 0.2–1.2)

## 2016-11-22 LAB — LIPID PANEL
CHOL/HDL RATIO: 3.1 (calc) (ref <5.0)
Cholesterol: 200 mg/dL — ABNORMAL HIGH (ref <200)
HDL: 64 mg/dL (ref >50)
LDL CHOLESTEROL (CALC): 105 mg/dL — AB
NON-HDL CHOLESTEROL (CALC): 136 mg/dL — AB (ref <130)
Triglycerides: 193 mg/dL — ABNORMAL HIGH (ref <150)

## 2016-11-22 LAB — IRON,TIBC AND FERRITIN PANEL
%SAT: 24 % (ref 11–50)
FERRITIN: 30 ng/mL (ref 20–288)
Iron: 82 ug/dL (ref 45–160)
TIBC: 343 ug/dL (ref 250–450)

## 2016-11-22 LAB — VITAMIN D 25 HYDROXY (VIT D DEFICIENCY, FRACTURES): Vit D, 25-Hydroxy: 37 ng/mL (ref 30–100)

## 2016-11-22 LAB — TSH: TSH: 3.03 m[IU]/L (ref 0.40–4.50)

## 2016-11-24 ENCOUNTER — Telehealth: Payer: Self-pay

## 2016-11-24 NOTE — Telephone Encounter (Signed)
Patient advised as below.  

## 2016-11-24 NOTE — Telephone Encounter (Signed)
-----   Message from Mar Daring, [place] sent at 11/24/2016  9:00 AM EDT ----- All labs are WNL and fairly stable with exception of cholesterol which is only borderline high. Work on lifestyle modifications with low fat, low carb diet and try to increase physical activity as tolerated up to 150 min moderate activity weekly.

## 2016-11-29 ENCOUNTER — Encounter: Payer: Self-pay | Admitting: Physician Assistant

## 2016-12-03 ENCOUNTER — Ambulatory Visit (INDEPENDENT_AMBULATORY_CARE_PROVIDER_SITE_OTHER)

## 2016-12-03 DIAGNOSIS — Z23 Encounter for immunization: Secondary | ICD-10-CM | POA: Diagnosis not present

## 2017-01-13 DIAGNOSIS — M25511 Pain in right shoulder: Secondary | ICD-10-CM | POA: Diagnosis not present

## 2017-02-23 DIAGNOSIS — Z79899 Other long term (current) drug therapy: Secondary | ICD-10-CM | POA: Diagnosis not present

## 2017-02-23 DIAGNOSIS — F410 Panic disorder [episodic paroxysmal anxiety] without agoraphobia: Secondary | ICD-10-CM | POA: Diagnosis not present

## 2017-02-25 ENCOUNTER — Encounter: Admitting: Physician Assistant

## 2017-02-25 ENCOUNTER — Other Ambulatory Visit: Payer: Self-pay | Admitting: Physician Assistant

## 2017-02-25 DIAGNOSIS — E119 Type 2 diabetes mellitus without complications: Secondary | ICD-10-CM

## 2017-02-25 MED ORDER — FREESTYLE LITE DEVI: 0 refills | Status: DC

## 2017-02-25 NOTE — Telephone Encounter (Signed)
Patient would like a new prescription for Blood Glucose Monitoring Suppl (FREESTYLE LITE) DEVI sent to [facility] in [place].  She states that her dog chewed up the meter that she has.

## 2017-03-16 ENCOUNTER — Other Ambulatory Visit: Payer: Self-pay | Admitting: Physician Assistant

## 2017-03-16 DIAGNOSIS — E7800 Pure hypercholesterolemia, unspecified: Secondary | ICD-10-CM

## 2017-03-16 MED ORDER — LOVASTATIN 40 MG PO TABS: 40.0000 mg | ORAL_TABLET | Freq: Every day | ORAL | 3 refills | Status: DC

## 2017-03-16 NOTE — Telephone Encounter (Signed)
Please review. Thanks!  

## 2017-03-16 NOTE — Telephone Encounter (Signed)
Patient needs refill on Lovastatin 40 mg. called  to [facility] in [place]

## 2017-03-30 ENCOUNTER — Encounter: Payer: Self-pay | Admitting: Physician Assistant

## 2017-03-30 ENCOUNTER — Ambulatory Visit (INDEPENDENT_AMBULATORY_CARE_PROVIDER_SITE_OTHER): Payer: Medicare Other | Admitting: Physician Assistant

## 2017-03-30 ENCOUNTER — Other Ambulatory Visit: Payer: Self-pay

## 2017-03-30 VITALS — BP 110/68 | HR 66 | Temp 97.7°F | Resp 16 | Ht 65.0 in | Wt 175.0 lb

## 2017-03-30 DIAGNOSIS — E782 Mixed hyperlipidemia: Secondary | ICD-10-CM

## 2017-03-30 DIAGNOSIS — Z0000 Encounter for general adult medical examination without abnormal findings: Secondary | ICD-10-CM

## 2017-03-30 DIAGNOSIS — Z1329 Encounter for screening for other suspected endocrine disorder: Secondary | ICD-10-CM | POA: Diagnosis not present

## 2017-03-30 DIAGNOSIS — E119 Type 2 diabetes mellitus without complications: Secondary | ICD-10-CM

## 2017-03-30 DIAGNOSIS — Z13220 Encounter for screening for lipoid disorders: Secondary | ICD-10-CM

## 2017-03-30 DIAGNOSIS — Z23 Encounter for immunization: Secondary | ICD-10-CM

## 2017-03-30 DIAGNOSIS — Z136 Encounter for screening for cardiovascular disorders: Secondary | ICD-10-CM

## 2017-03-30 DIAGNOSIS — R9431 Abnormal electrocardiogram [ECG] [EKG]: Secondary | ICD-10-CM

## 2017-03-30 DIAGNOSIS — S61210A Laceration without foreign body of right index finger without damage to nail, initial encounter: Secondary | ICD-10-CM

## 2017-03-30 DIAGNOSIS — Z780 Asymptomatic menopausal state: Secondary | ICD-10-CM | POA: Diagnosis not present

## 2017-03-30 NOTE — Progress Notes (Signed)
Patient: Melissa Arroyo, Female    DOB: [birth year], 67 y.o.   MRN: [id number] Visit Date: 03/30/2017  Today's Provider: Mar Daring, PA-C   Chief Complaint  Patient presents with  . Medicare Wellness   Subjective:    Annual wellness visit [name] is a 67 y.o. female. She feels well. She reports exercising. She reports she is sleeping well. She has no complaints today.   Eye Exam:2016 Previous Pap were completed at [place] was at [facility] was negative 07/31/15 -----------------------------------------------------------   Review of Systems  Constitutional: Negative.   HENT: Negative.   Eyes: Negative.   Respiratory: Negative.   Cardiovascular: Negative.   Gastrointestinal: Negative.   Endocrine: Positive for polyuria.  Genitourinary: Negative.   Musculoskeletal: Negative.   Skin: Negative.   Allergic/Immunologic: Negative.   Neurological: Negative.   Hematological: Negative.   Psychiatric/Behavioral: Negative.     Social History   Socioeconomic History  . Marital status: Widowed    Spouse name: Not on file  . Number of children: Not on file  . Years of education: Not on file  . Highest education level: Not on file  Social Needs  . Financial resource strain: Not on file  . Food insecurity - worry: Not on file  . Food insecurity - inability: Not on file  . Transportation needs - medical: Not on file  . Transportation needs - non-medical: Not on file  Occupational History    Employer: [id number]    Comment: retired   Tobacco Use  . Smoking status: Never Smoker  . Smokeless tobacco: Never Used  Substance and Sexual Activity  . Alcohol use: No    Alcohol/week: 0.0 oz    Comment: Quit in 04/24/1993  . Drug use: No  . Sexual activity: Not on file  Other Topics Concern  . Not on file  Social History Narrative  . Not on file    Past Medical History:  Diagnosis Date  . Anxiety   . Depression   . Diabetes  mellitus ([place])   . Heart attack ([place]) 04/2007  . Heart attack ([place])   . Hyperlipidemia   . Hypertension   . Obesity   . Recurrent UTI   . Renal lesion   . Urinary incontinence in female   . Vaginal atrophy      Patient Active Problem List   Diagnosis Date Noted  . Depression, recurrent ([place]) 05/26/2016  . Benign essential HTN 02/14/2015  . Dysuria 12/24/2014  . Recurrent UTI 09/28/2014  . Dyspareunia in female 09/28/2014  . Atrophic vaginitis 09/28/2014  . Urge incontinence 09/28/2014  . Renal cyst, left 07/24/2014  . Microscopic hematuria 07/24/2014  . Headache, tension-type 07/24/2014  . Combined fat and carbohydrate induced hyperlipemia 07/24/2014  . Diabetes mellitus, type 2 ([place]) 07/24/2014    Past Surgical History:  Procedure Laterality Date  . BUNIONECTOMY  2001  . NECK SURGERY    . ROTATOR CUFF REPAIR Left     Her family history includes Cancer in her mother and unknown relative; Diabetes in her mother; Hypertension in her mother.      Current Outpatient Medications:  .  Alcohol Swabs (ALCOHOL PREP) PADS, Use 1 pad prior to checking blood sugar to clean skin, Disp: 100 each, Rfl: 3 .  aspirin EC 81 MG tablet, Take 81 mg by mouth daily. , Disp: , Rfl:  .  Blood Glucose Monitoring Suppl (FREESTYLE LITE) DEVI, Check fasting blood  sugar q a.m., Disp: 1 each, Rfl: 0 .  clonazePAM (KLONOPIN) 1 MG tablet, TAKE ONE TABLET BY MOUTH AT BEDTIME.  MAY TAKE ONE-HALF TABLET EVERY 8 HOURS DURING THE DAY FOR PANIC ATTACKS IF NEEDED, Disp: 180 tablet, Rfl: 1 .  empagliflozin (JARDIANCE) 25 MG TABS tablet, Take 25 mg by mouth daily., Disp: 90 tablet, Rfl: 3 .  estradiol (ESTRACE) 0.1 MG/GM vaginal cream, Place 1 Applicatorful vaginally at bedtime., Disp: 42.5 g, Rfl: 12 .  glipiZIDE (GLUCOTROL XL) 10 MG 24 hr tablet, Take 1 tablet (10 mg total) by mouth daily with breakfast., Disp: 90 tablet, Rfl: 3 .  Glucosamine-Chondroitin (MOVE FREE PO), Take 1 tablet by mouth daily., Disp: ,  Rfl:  .  glucose blood (FREESTYLE LITE) test strip, Check fasting blood sugar q a.m., Disp: 100 each, Rfl: 12 .  ibuprofen (ADVIL,MOTRIN) 200 MG tablet, Take 400 mg by mouth at bedtime., Disp: , Rfl:  .  Lancets (FREESTYLE) lancets, Check fasting blood glucose q a.m, Disp: 100 each, Rfl: 12 .  lovastatin (MEVACOR) 40 MG tablet, Take 1 tablet (40 mg total) by mouth at bedtime., Disp: 90 tablet, Rfl: 3 .  metFORMIN (GLUCOPHAGE) 1000 MG tablet, Take 0.5 tablets (500 mg total) by mouth 2 (two) times daily with a meal., Disp: 180 tablet, Rfl: 3 .  Multiple Vitamins-Minerals (CENTRUM SILVER PO), Take 1 tablet by mouth daily., Disp: , Rfl:  .  nitroGLYCERIN (NITROSTAT) 0.4 MG SL tablet, Place 0.4 mg under the tongue every 5 (five) minutes as needed. , Disp: , Rfl:  .  OLANZapine-FLUoxetine (SYMBYAX) 6-50 MG capsule, Take 1 capsule by mouth daily., Disp: 30 capsule, Rfl: 6 .  PREVNAR 13 SUSP injection, , Disp: , Rfl:  .  propranolol (INDERAL) 10 MG tablet, Take 1 tablet (10 mg total) by mouth 2 (two) times daily. (Patient taking differently: Take 5 mg by mouth 2 (two) times daily. ), Disp: 180 tablet, Rfl: 3 .  Ubiquinone (ULTRA COQ10 PO), Take 1 tablet by mouth daily., Disp: , Rfl:   Patient Care Team: Mar Daring, PA-C as PCP - General (Family Medicine)     Objective:   Vitals: BP 110/68 (BP Location: Left Arm, Patient Position: Sitting, Cuff Size: Normal)   Pulse 66   Temp 97.7 F (36.5 C) (Oral)   Resp 16   Ht 5\' 5"  (1.651 m)   Wt 175 lb (79.4 kg)   SpO2 98%   BMI 29.12 kg/m   Physical Exam  Constitutional: She is oriented to person, place, and time. She appears well-developed and well-nourished. No distress.  HENT:  Head: Normocephalic and atraumatic.  Right Ear: Hearing, tympanic membrane, external ear and ear canal normal.  Left Ear: Hearing, tympanic membrane, external ear and ear canal normal.  Nose: Nose normal.  Mouth/Throat: Uvula is midline, oropharynx is clear and  moist and mucous membranes are normal. No oropharyngeal exudate.  Eyes: Conjunctivae and EOM are normal. Pupils are equal, round, and reactive to light. Right eye exhibits no discharge. Left eye exhibits no discharge. No scleral icterus.  Neck: Normal range of motion. Neck supple. No JVD present. No tracheal deviation present. No thyromegaly present.  Cardiovascular: Normal rate, regular rhythm, normal heart sounds and intact distal pulses. Exam reveals no gallop and no friction rub.  No murmur heard. Pulmonary/Chest: Effort normal and breath sounds normal. No respiratory distress. She has no wheezes. She has no rales. She exhibits no tenderness.  Abdominal: Soft. Bowel sounds are normal. She exhibits  no distension and no mass. There is no tenderness. There is no rebound and no guarding.  Musculoskeletal: Normal range of motion. She exhibits no edema or tenderness.  Lymphadenopathy:    She has no cervical adenopathy.  Neurological: She is alert and oriented to person, place, and time. She has normal reflexes.  Skin: Skin is warm and dry. Laceration noted. No rash noted. She is not diaphoretic.     Psychiatric: She has a normal mood and affect. Her behavior is normal. Judgment and thought content normal.  Vitals reviewed.   Activities of Daily Living In your present state of health, do you have any difficulty performing the following activities: 03/30/2017 04/09/2016  Hearing? N N  Vision? N N  Difficulty concentrating or making decisions? N N  Walking or climbing stairs? N N  Dressing or bathing? N N  Doing errands, shopping? N N  Some recent data might be hidden    Fall Risk Assessment Fall Risk  03/30/2017 11/29/2015 11/08/2015  Falls in the past year? No (No Data) No  Comment - no falls since previous visit -     Depression Screen PHQ 2/9 Scores 03/30/2017 11/21/2016 04/09/2016 11/08/2015  PHQ - 2 Score 0 4 2 6   PHQ- 9 Score 0 12 3 7     Cognitive Testing - 6-CIT  Correct? Score    What year is it? yes 0 0 or 4  What month is it? yes 0 0 or 3  Memorize:    Pia Mau,  43,  [address],  [place],      What time is it? (within 1 hour) yes 0 0 or 3  Count backwards from 20 yes 0 0, 2, or 4  Name the months of the year yes 0 0, 2, or 4  Repeat name & address above yes 0 0, 2, 4, 6, 8, or 10       TOTAL SCORE  0/28   Interpretation:  Normal  Normal (0-7) Abnormal (8-28)       Assessment & Plan:     Annual Wellness Visit  Reviewed patient's Family Medical History Reviewed and updated list of patient's medical providers Assessment of cognitive impairment was done Assessed patient's functional ability Established a written schedule for health screening [place] Completed and Reviewed  Exercise Activities and Dietary recommendations Goals    None      Immunization History  Administered Date(s) Administered  . Influenza,inj,Quad PF,6+ Mos 12/03/2016  . Influenza-Unspecified 12/01/2015  . Pneumococcal Conjugate-13 12/01/2015    Health Maintenance  Topic Date Due  . Hepatitis C Screening  06/30/1950  . OPHTHALMOLOGY EXAM  01/24/1962  . HIV Screening  01/25/1967  . TETANUS/TDAP  01/25/1971  . PAP SMEAR  01/24/1973  . MAMMOGRAM  01/24/2002  . DEXA SCAN  01/24/2017  . PNA vac Low Risk Adult (2 of 2 - PPSV23) 01/24/2017  . HEMOGLOBIN A1C  05/22/2017  . FOOT EXAM  11/21/2017  . URINE MICROALBUMIN  11/21/2017  . Fecal DNA (Cologuard)  07/31/2018  . INFLUENZA VACCINE  Completed     Discussed health benefits of physical activity, and encouraged her to engage in regular exercise appropriate for her age and condition.    1. Medicare annual wellness visit, initial Normal exam today. Vaccinations were updated. Screening baseline EKG obtained. Labs ordered.  - EKG 12-Lead - CBC with Differential/Platelet - Comprehensive metabolic panel  2. Type 2 diabetes mellitus without complication, without long-term current use of insulin  (  [place]) Will check labs as below and f/u pending results. - Hemoglobin A1c  3. Screening for thyroid disorder Will check labs as below and f/u pending results. - TSH  4. Encounter for lipid screening for cardiovascular disease Will check labs as below and f/u pending results. - Lipid panel  5. Combined fat and carbohydrate induced hyperlipemia Will check labs as below and f/u pending results. - Lipid panel  6. Abnormal EKG NSR with New left atrial enlargement and possible q waves noted with rate of 65. Change from 2017 EKG. Also there was more artefact noted on this EKG so may not be accurate. Patient denies any symptoms. Will refer back to Dr. Nehemiah Massed with whom she has an established relationship for further evaluation.  - Ambulatory referral to Cardiology  7. Postmenopausal estrogen deficiency Baseline BMD for osteoporosis screen.  - DG Bone Density; Future  8. Laceration of right index finger without foreign body without damage to nail, initial encounter Tdap Vaccine given to patient without complications. Patient sat for 15 minutes after administration and was tolerated well without adverse effects. - Tdap vaccine greater than or equal to 7yo IM  9. Need for 23-polyvalent pneumococcal polysaccharide vaccine Pneumococcal 23 Vaccine given to patient without complications. Patient sat for 15 minutes after administration and was tolerated well without adverse effects. - Pneumococcal polysaccharide vaccine 23-valent greater than or equal to 2yo subcutaneous/IM  ------------------------------------------------------------------------------------------------------------    Mar Daring, PA-C  [facility]

## 2017-03-31 LAB — CBC WITH DIFFERENTIAL/PLATELET
BASOS ABS: 0 10*3/uL (ref 0.0–0.2)
Basos: 1 %
EOS (ABSOLUTE): 0.4 10*3/uL (ref 0.0–0.4)
Eos: 4 %
HEMATOCRIT: 42.5 % (ref 34.0–46.6)
HEMOGLOBIN: 14.2 g/dL (ref 11.1–15.9)
Immature Grans (Abs): 0 10*3/uL (ref 0.0–0.1)
Immature Granulocytes: 0 %
LYMPHS ABS: 2.9 10*3/uL (ref 0.7–3.1)
Lymphs: 36 %
MCH: 31.4 pg (ref 26.6–33.0)
MCHC: 33.4 g/dL (ref 31.5–35.7)
MCV: 94 fL (ref 79–97)
MONOCYTES: 5 %
Monocytes Absolute: 0.4 10*3/uL (ref 0.1–0.9)
NEUTROS ABS: 4.4 10*3/uL (ref 1.4–7.0)
Neutrophils: 54 %
Platelets: 324 10*3/uL (ref 150–379)
RBC: 4.52 x10E6/uL (ref 3.77–5.28)
RDW: 14.4 % (ref 12.3–15.4)
WBC: 8.2 10*3/uL (ref 3.4–10.8)

## 2017-03-31 LAB — COMPREHENSIVE METABOLIC PANEL
ALBUMIN: 4.7 g/dL (ref 3.6–4.8)
ALK PHOS: 92 IU/L (ref 39–117)
ALT: 27 IU/L (ref 0–32)
AST: 26 IU/L (ref 0–40)
Albumin/Globulin Ratio: 1.8 (ref 1.2–2.2)
BILIRUBIN TOTAL: 0.4 mg/dL (ref 0.0–1.2)
BUN / CREAT RATIO: 16 (ref 12–28)
BUN: 14 mg/dL (ref 8–27)
CHLORIDE: 98 mmol/L (ref 96–106)
CO2: 25 mmol/L (ref 20–29)
Calcium: 10.1 mg/dL (ref 8.7–10.3)
Creatinine, Ser: 0.85 mg/dL (ref 0.57–1.00)
GFR calc Af Amer: 83 mL/min/{1.73_m2} (ref >59)
GFR calc non Af Amer: 72 mL/min/{1.73_m2} (ref >59)
GLOBULIN, TOTAL: 2.6 g/dL (ref 1.5–4.5)
Glucose: 129 mg/dL — ABNORMAL HIGH (ref 65–99)
POTASSIUM: 4.7 mmol/L (ref 3.5–5.2)
Sodium: 140 mmol/L (ref 134–144)
Total Protein: 7.3 g/dL (ref 6.0–8.5)

## 2017-03-31 LAB — TSH: TSH: 4.03 u[IU]/mL (ref 0.450–4.500)

## 2017-03-31 LAB — LIPID PANEL
CHOLESTEROL TOTAL: 216 mg/dL — AB (ref 100–199)
Chol/HDL Ratio: 2.3 ratio (ref 0.0–4.4)
HDL: 93 mg/dL (ref >39)
LDL Calculated: 99 mg/dL (ref 0–99)
Triglycerides: 121 mg/dL (ref 0–149)
VLDL Cholesterol Cal: 24 mg/dL (ref 5–40)

## 2017-03-31 LAB — HEMOGLOBIN A1C
Est. average glucose Bld gHb Est-mCnc: 146 mg/dL
Hgb A1c MFr Bld: 6.7 % — ABNORMAL HIGH (ref 4.8–5.6)

## 2017-04-01 ENCOUNTER — Telehealth: Payer: Self-pay

## 2017-04-01 ENCOUNTER — Other Ambulatory Visit: Payer: Self-pay | Admitting: Physician Assistant

## 2017-04-01 DIAGNOSIS — Z1231 Encounter for screening mammogram for malignant neoplasm of breast: Secondary | ICD-10-CM

## 2017-04-01 NOTE — Telephone Encounter (Signed)
Patient advised as below. Patient verbalizes understanding and is in agreement with treatment plan.  

## 2017-04-01 NOTE — Telephone Encounter (Signed)
-----   Message from Mar Daring, PA-C sent at 04/01/2017 11:23 AM EST ----- Cholesterol stable. A1c up to 6.7 from 6.5 at last check. Conitnue healthy lifestyle modifications and medications as prescribed. All other labs are normal.

## 2017-04-10 DIAGNOSIS — E119 Type 2 diabetes mellitus without complications: Secondary | ICD-10-CM | POA: Diagnosis not present

## 2017-04-10 DIAGNOSIS — E782 Mixed hyperlipidemia: Secondary | ICD-10-CM | POA: Diagnosis not present

## 2017-04-10 DIAGNOSIS — R9431 Abnormal electrocardiogram [ECG] [EKG]: Secondary | ICD-10-CM | POA: Insufficient documentation

## 2017-04-10 DIAGNOSIS — I10 Essential (primary) hypertension: Secondary | ICD-10-CM | POA: Diagnosis not present

## 2017-04-15 ENCOUNTER — Other Ambulatory Visit: Payer: Self-pay

## 2017-04-15 DIAGNOSIS — E119 Type 2 diabetes mellitus without complications: Secondary | ICD-10-CM

## 2017-04-15 DIAGNOSIS — E7800 Pure hypercholesterolemia, unspecified: Secondary | ICD-10-CM

## 2017-04-15 DIAGNOSIS — I10 Essential (primary) hypertension: Secondary | ICD-10-CM

## 2017-04-15 MED ORDER — EMPAGLIFLOZIN 25 MG PO TABS: 25.0000 mg | ORAL_TABLET | Freq: Every day | ORAL | 3 refills | Status: DC

## 2017-04-15 MED ORDER — LOVASTATIN 40 MG PO TABS
40.0000 mg | ORAL_TABLET | Freq: Every day | ORAL | 3 refills | Status: DC
Start: 2017-04-15 — End: 2018-06-14

## 2017-04-15 MED ORDER — PROPRANOLOL HCL 10 MG PO TABS: 10.0000 mg | ORAL_TABLET | Freq: Two times a day (BID) | ORAL | 3 refills | Status: DC

## 2017-04-15 NOTE — Telephone Encounter (Signed)
Pt requesting refills of Propanolol, lovastatin and Jardiance sent to [facility].

## 2017-04-16 ENCOUNTER — Telehealth: Payer: Self-pay | Admitting: *Deleted

## 2017-04-16 NOTE — Telephone Encounter (Signed)
Patient called office stating the Jardiance  rx that was sent to pharmacy was $505 out of pocket. Patient wanted to know if a PA can be done or change the medication to something else. Please advise?

## 2017-04-17 NOTE — Telephone Encounter (Signed)
Patient advised as directed below.Will come and pick up samples tomorrow.

## 2017-04-17 NOTE — Telephone Encounter (Signed)
I dont know if a PA will help as if she has a high deductible plan for medications it may not help cost at all. May need to have her call insurance company to see if any of that class are covered by her insurance. We can give her samples of 25mg  jardiance if we have in office to cover her until we see if anything else is covered.

## 2017-04-17 NOTE — Telephone Encounter (Signed)
Please review.  Thanks,  -Joseline 

## 2017-04-29 ENCOUNTER — Telehealth: Payer: Self-pay | Admitting: Physician Assistant

## 2017-04-29 NOTE — Telephone Encounter (Signed)
Patient is requesting samples of empagliflozin (JARDIANCE) 25 MG TABS tablet.  She states that she still does not have the money for her $500 deductible.  She states that she would like at least two week supply if possible.

## 2017-04-29 NOTE — Telephone Encounter (Signed)
2 bottles of Jardiance 25mg   samples for 2 weeks placed up front ready for pick up.patient advised.  Thanks,  -Joseline

## 2017-04-29 NOTE — Telephone Encounter (Signed)
[place] for samples if we have them

## 2017-04-30 ENCOUNTER — Ambulatory Visit
Admission: RE | Admit: 2017-04-30 | Discharge: 2017-04-30 | Disposition: A | Discharge status: Home / Self Care | Payer: Medicare Other | Source: Ambulatory Visit | Attending: Physician Assistant | Admitting: Physician Assistant

## 2017-04-30 DIAGNOSIS — R928 Other abnormal and inconclusive findings on diagnostic imaging of breast: Secondary | ICD-10-CM | POA: Insufficient documentation

## 2017-04-30 DIAGNOSIS — Z1231 Encounter for screening mammogram for malignant neoplasm of breast: Secondary | ICD-10-CM | POA: Insufficient documentation

## 2017-04-30 DIAGNOSIS — Z13820 Encounter for screening for osteoporosis: Secondary | ICD-10-CM | POA: Insufficient documentation

## 2017-04-30 DIAGNOSIS — Z780 Asymptomatic menopausal state: Secondary | ICD-10-CM | POA: Diagnosis not present

## 2017-05-01 ENCOUNTER — Telehealth: Payer: Self-pay

## 2017-05-01 NOTE — Telephone Encounter (Signed)
Viewed by [name] on 05/01/2017 2:10 PM   Called patient and advised as directed below. Per patient she viewed Jenni's message through my chart and is going to call [facility] to schedule.

## 2017-05-01 NOTE — Progress Notes (Signed)
Advised  ED 

## 2017-05-01 NOTE — Telephone Encounter (Signed)
-----   Message from Mar Daring, [place] sent at 05/01/2017  2:09 PM EDT ----- Abnormality noted in the right breast. [facility] will be contacting you to schedule a diagnostic mammogram and US of the right breast. At this time this is all we know and they want further images.

## 2017-05-04 ENCOUNTER — Ambulatory Visit (INDEPENDENT_AMBULATORY_CARE_PROVIDER_SITE_OTHER): Payer: Medicare Other | Admitting: Physician Assistant

## 2017-05-04 ENCOUNTER — Other Ambulatory Visit: Payer: Self-pay | Admitting: Physician Assistant

## 2017-05-04 ENCOUNTER — Encounter: Payer: Self-pay | Admitting: Physician Assistant

## 2017-05-04 VITALS — BP 140/70 | HR 70 | Temp 97.6°F | Resp 16 | Wt 187.0 lb

## 2017-05-04 DIAGNOSIS — R350 Frequency of micturition: Secondary | ICD-10-CM

## 2017-05-04 DIAGNOSIS — B379 Candidiasis, unspecified: Secondary | ICD-10-CM | POA: Diagnosis not present

## 2017-05-04 DIAGNOSIS — R300 Dysuria: Secondary | ICD-10-CM | POA: Diagnosis not present

## 2017-05-04 DIAGNOSIS — N6310 Unspecified lump in the right breast, unspecified quadrant: Secondary | ICD-10-CM

## 2017-05-04 DIAGNOSIS — R928 Other abnormal and inconclusive findings on diagnostic imaging of breast: Secondary | ICD-10-CM

## 2017-05-04 LAB — POCT URINALYSIS DIPSTICK
BILIRUBIN UA: NEGATIVE
Blood, UA: NEGATIVE
GLUCOSE UA: 2000
Ketones, UA: NEGATIVE
LEUKOCYTES UA: NEGATIVE
NITRITE UA: NEGATIVE
PH UA: 8 (ref 5.0–8.0)
Protein, UA: NEGATIVE
Spec Grav, UA: <=1.005 — AB (ref 1.010–1.025)
Urobilinogen, UA: 0.2 E.U./dL

## 2017-05-04 MED ORDER — FLUCONAZOLE 150 MG PO TABS: 150.0000 mg | ORAL_TABLET | Freq: Once | ORAL | 0 refills | Status: AC

## 2017-05-04 NOTE — Progress Notes (Signed)
Patient: Melissa Arroyo Female    DOB: [birth year]   67 y.o.   MRN: [id number] Visit Date: 05/04/2017  Today's Provider: Mar Daring, PA-C   Chief Complaint  Patient presents with  . Urinary Frequency   Subjective:    Urinary Tract Infection   This is a new problem. The current episode started 1 to 4 weeks ago (for a month). The problem occurs every urination. The problem has been gradually worsening. The quality of the pain is described as burning. The patient is experiencing no pain. She is sexually active. There is no history of pyelonephritis. Associated symptoms include frequency and urgency. Pertinent negatives include no chills, discharge, flank pain, hematuria, nausea, possible pregnancy or vomiting. She has tried nothing for the symptoms.     Allergies  Allergen Reactions  . Penicillins Rash  . Vesicare [Solifenacin] Anaphylaxis    Throat Closed     Current Outpatient Medications:  .  Alcohol Swabs (ALCOHOL PREP) PADS, Use 1 pad prior to checking blood sugar to clean skin, Disp: 100 each, Rfl: 3 .  aspirin EC 81 MG tablet, Take 81 mg by mouth daily. , Disp: , Rfl:  .  Blood Glucose Monitoring Suppl (FREESTYLE LITE) DEVI, Check fasting blood sugar q a.m., Disp: 1 each, Rfl: 0 .  clonazePAM (KLONOPIN) 1 MG tablet, TAKE ONE TABLET BY MOUTH AT BEDTIME.  MAY TAKE ONE-HALF TABLET EVERY 8 HOURS DURING THE DAY FOR PANIC ATTACKS IF NEEDED, Disp: 180 tablet, Rfl: 1 .  empagliflozin (JARDIANCE) 25 MG TABS tablet, Take 25 mg by mouth daily., Disp: 90 tablet, Rfl: 3 .  estradiol (ESTRACE) 0.1 MG/GM vaginal cream, Place 1 Applicatorful vaginally at bedtime., Disp: 42.5 g, Rfl: 12 .  glipiZIDE (GLUCOTROL XL) 10 MG 24 hr tablet, Take 1 tablet (10 mg total) by mouth daily with breakfast., Disp: 90 tablet, Rfl: 3 .  Glucosamine-Chondroitin (MOVE FREE PO), Take 1 tablet by mouth daily., Disp: , Rfl:  .  glucose blood (FREESTYLE LITE) test strip, Check fasting blood sugar q  a.m., Disp: 100 each, Rfl: 12 .  ibuprofen (ADVIL,MOTRIN) 200 MG tablet, Take 400 mg by mouth at bedtime., Disp: , Rfl:  .  Lancets (FREESTYLE) lancets, Check fasting blood glucose q a.m, Disp: 100 each, Rfl: 12 .  lovastatin (MEVACOR) 40 MG tablet, Take 1 tablet (40 mg total) by mouth at bedtime., Disp: 90 tablet, Rfl: 3 .  metFORMIN (GLUCOPHAGE) 1000 MG tablet, Take 0.5 tablets (500 mg total) by mouth 2 (two) times daily with a meal., Disp: 180 tablet, Rfl: 3 .  Multiple Vitamins-Minerals (CENTRUM SILVER PO), Take 1 tablet by mouth daily., Disp: , Rfl:  .  OLANZapine-FLUoxetine (SYMBYAX) 6-50 MG capsule, Take 1 capsule by mouth daily., Disp: 30 capsule, Rfl: 6 .  propranolol (INDERAL) 10 MG tablet, Take 1 tablet (10 mg total) by mouth 2 (two) times daily., Disp: 180 tablet, Rfl: 3 .  Ubiquinone (ULTRA COQ10 PO), Take 1 tablet by mouth daily., Disp: , Rfl:  .  nitroGLYCERIN (NITROSTAT) 0.4 MG SL tablet, Place 0.4 mg under the tongue every 5 (five) minutes as needed. , Disp: , Rfl:  .  PREVNAR 13 SUSP injection, , Disp: , Rfl:   Review of Systems  Constitutional: Negative for chills.  Respiratory: Negative.   Cardiovascular: Negative.   Gastrointestinal: Negative for abdominal pain, nausea and vomiting.  Genitourinary: Positive for dysuria, frequency and urgency. Negative for enuresis, flank pain and hematuria.  Neurological: Negative.  Social History   Tobacco Use  . Smoking status: Never Smoker  . Smokeless tobacco: Never Used  Substance Use Topics  . Alcohol use: No    Alcohol/week: 0.0 oz    Comment: Quit in 04/24/1993   Objective:   BP 140/70 (BP Location: Left Arm, Patient Position: Sitting, Cuff Size: Normal)   Pulse 70   Temp 97.6 F (36.4 C) (Oral)   Resp 16   Wt 187 lb (84.8 kg)   SpO2 97%   BMI 31.12 kg/m    Physical Exam  Constitutional: She is oriented to person, place, and time. She appears well-developed and well-nourished. No distress.  Cardiovascular:  Normal rate, regular rhythm and normal heart sounds. Exam reveals no gallop and no friction rub.  No murmur heard. Pulmonary/Chest: Effort normal and breath sounds normal. No respiratory distress. She has no wheezes. She has no rales.  Abdominal: Soft. Normal appearance and bowel sounds are normal. She exhibits no distension and no mass. There is no hepatosplenomegaly. There is no tenderness. There is no rebound, no guarding and no CVA tenderness.  Neurological: She is alert and oriented to person, place, and time.  Skin: Skin is warm and dry. She is not diaphoretic.  Vitals reviewed.      Assessment & Plan:     1. Frequency of urination UA negative except positive glucosuria (on jardiance). Suspect yeast infection from [place]. Will stop Jardiance due to cost for patient (greater than $500). I will see her back in 3 months to check A1c. Diflucan given for yeast infection. Urine culture sent to confirm no bacterial infection. If so will treat per C&S results. Push fluids. Call if no improvements.  - POCT urinalysis dipstick - Urine Culture  2. Burning with urination See above medical treatment plan. - POCT urinalysis dipstick - Urine Culture  3. Yeast infection See above medical treatment plan. - fluconazole (DIFLUCAN) 150 MG tablet; Take 1 tablet (150 mg total) by mouth once for 1 dose. May take 2nd tab if needed 48-72 hrs after 1st  Dispense: 2 tablet; Refill: 0       Mar Daring, PA-C  [facility]

## 2017-05-04 NOTE — Patient Instructions (Signed)
Fluconazole tablets °What is this medicine? °FLUCONAZOLE (floo KON na zole) is an antifungal medicine. It is used to treat certain kinds of fungal or yeast infections. °This medicine may be used for other purposes; ask your health care provider or pharmacist if you have questions. °COMMON BRAND NAME(S): Diflucan °What should I tell my health care provider before I take this medicine? °They need to know if you have any of these conditions: °-history of irregular heart beat °-kidney disease °-an unusual or allergic reaction to fluconazole, other azole antifungals, medicines, foods, dyes, or preservatives °-pregnant or trying to get pregnant °-breast-feeding °How should I use this medicine? °Take this medicine by mouth. Follow the directions on the prescription label. Do not take your medicine more often than directed. °Talk to your pediatrician regarding the use of this medicine in children. Special care may be needed. This medicine has been used in children as young as 6 months of age. °Overdosage: If you think you have taken too much of this medicine contact a poison control center or emergency room at once. °NOTE: This medicine is only for you. Do not share this medicine with others. °What if I miss a dose? °If you miss a dose, take it as soon as you can. If it is almost time for your next dose, take only that dose. Do not take double or extra doses. °What may interact with this medicine? °Do not take this medicine with any of the following medications: °-astemizole °-certain medicines for irregular heart beat like dofetilide, dronedarone, quinidine °-cisapride °-erythromycin °-lomitapide °-other medicines that prolong the QT interval (cause an abnormal heart rhythm) °-pimozide °-terfenadine °-thioridazine °-tolvaptan °-ziprasidone °This medicine may also interact with the following medications: °-antiviral medicines for HIV or AIDS °-birth control pills °-certain antibiotics like rifabutin, rifampin °-certain  medicines for blood pressure like amlodipine, isradipine, felodipine, hydrochlorothiazide, losartan, nifedipine °-certain medicines for cancer like cyclophosphamide, vinblastine, vincristine °-certain medicines for cholesterol like atorvastatin, lovastatin, fluvastatin, simvastatin °-certain medicines for depression, anxiety, or psychotic disturbances like amitriptyline, midazolam, nortriptyline, triazolam °-certain medicines for diabetes like glipizide, glyburide, tolbutamide °-certain medicines for pain like alfentanil, fentanyl, methadone °-certain medicines for seizures like carbamazepine, phenytoin °-certain medicines that treat or prevent blood clots like warfarin °-halofantrine °-medicines that lower your chance of fighting infection like cyclosporine, prednisone, tacrolimus °-NSAIDS, medicines for pain and inflammation, like celecoxib, diclofenac, flurbiprofen, ibuprofen, meloxicam, naproxen °-other medicines for fungal infections °-sirolimus °-theophylline °-tofacitinib °This list may not describe all possible interactions. Give your health care provider a list of all the medicines, herbs, non-prescription drugs, or dietary supplements you use. Also tell them if you smoke, drink alcohol, or use illegal drugs. Some items may interact with your medicine. °What should I watch for while using this medicine? °Visit your doctor or health care professional for regular checkups. If you are taking this medicine for a long time you may need blood work. Tell your doctor if your symptoms do not improve. Some fungal infections need many weeks or months of treatment to cure. °Alcohol can increase possible damage to your liver. Avoid alcoholic drinks. °If you have a vaginal infection, do not have sex until you have finished your treatment. You can wear a sanitary napkin. Do not use tampons. Wear freshly washed cotton, not synthetic, panties. °What side effects may I notice from receiving this medicine? °Side effects that  you should report to your doctor or health care professional as soon as possible: °-allergic reactions like skin rash or itching, hives, swelling of the   lips, mouth, tongue, or throat °-dark urine °-feeling dizzy or faint °-irregular heartbeat or chest pain °-redness, blistering, peeling or loosening of the skin, including inside the mouth °-trouble breathing °-unusual bruising or bleeding °-vomiting °-yellowing of the eyes or skin °Side effects that usually do not require medical attention (report to your doctor or health care professional if they continue or are bothersome): °-changes in how food tastes °-diarrhea °-headache °-stomach upset or nausea °This list may not describe all possible side effects. Call your doctor for medical advice about side effects. You may report side effects to FDA at 1-800-FDA-1088. °Where should I keep my medicine? °Keep out of the reach of children. °Store at room temperature below 30 degrees C (86 degrees F). Throw away any medicine after the expiration date. °NOTE: This sheet is a summary. It may not cover all possible information. If you have questions about this medicine, talk to your doctor, pharmacist, or health care provider. °© 2018 Elsevier/Gold Standard (2012-09-04 19:37:38) ° °

## 2017-05-05 LAB — URINE CULTURE

## 2017-05-06 ENCOUNTER — Telehealth: Payer: Self-pay

## 2017-05-06 NOTE — Telephone Encounter (Signed)
Patient advised as directed below.  Thanks,  -Wilhelmine Krogstad 

## 2017-05-06 NOTE — Telephone Encounter (Signed)
Pt returned call

## 2017-05-06 NOTE — Telephone Encounter (Signed)
lmtcb

## 2017-05-06 NOTE — Telephone Encounter (Signed)
-----   Message from Mar Daring, [place] sent at 05/05/2017  6:17 PM EDT ----- Urine culture showed no bacterial growth. Please call if symptoms do not improve with diflucan.

## 2017-05-07 ENCOUNTER — Encounter: Payer: Self-pay | Admitting: Obstetrics and Gynecology

## 2017-05-07 ENCOUNTER — Ambulatory Visit (INDEPENDENT_AMBULATORY_CARE_PROVIDER_SITE_OTHER): Payer: Medicare Other | Admitting: Obstetrics and Gynecology

## 2017-05-07 VITALS — BP 128/70 | Ht 65.0 in | Wt 184.0 lb

## 2017-05-07 DIAGNOSIS — Z124 Encounter for screening for malignant neoplasm of cervix: Secondary | ICD-10-CM | POA: Diagnosis not present

## 2017-05-07 DIAGNOSIS — Z1239 Encounter for other screening for malignant neoplasm of breast: Secondary | ICD-10-CM

## 2017-05-07 DIAGNOSIS — Z01419 Encounter for gynecological examination (general) (routine) without abnormal findings: Secondary | ICD-10-CM

## 2017-05-07 DIAGNOSIS — Z0000 Encounter for general adult medical examination without abnormal findings: Secondary | ICD-10-CM

## 2017-05-07 DIAGNOSIS — Z1231 Encounter for screening mammogram for malignant neoplasm of breast: Secondary | ICD-10-CM

## 2017-05-07 DIAGNOSIS — Z13820 Encounter for screening for osteoporosis: Secondary | ICD-10-CM

## 2017-05-07 NOTE — Progress Notes (Signed)
Gynecology Annual Exam  PCP: Mar Daring, PA-C  Chief Complaint:  Chief Complaint  Patient presents with  . Annual Exam    History of Present Illness: Patient is a 67 y.o. G0P0000 presents for annual exam. The patient has no complaints today.   LMP: No LMP recorded. Patient is postmenopausal. Postcoital Bleeding: no Dysmenorrhea: no   The patient is sexually active. She currently uses none for contraception  The patient denies current symptoms of depression.    Review of Systems: ROS  Past Medical History:  Past Medical History:  Diagnosis Date  . Anxiety   . Depression   . Diabetes mellitus ([place])   . Heart attack ([place]) 04/2007  . Heart attack ([place])   . Hyperlipidemia   . Hypertension   . Obesity   . Recurrent UTI   . Renal lesion   . Urinary incontinence in female   . Vaginal atrophy     Past Surgical History:  Past Surgical History:  Procedure Laterality Date  . BUNIONECTOMY  2001  . CARDIAC CATHETERIZATION    . NECK SURGERY    . ROTATOR CUFF REPAIR Left     Gynecologic History:  No LMP recorded. Patient is postmenopausal. Contraception: none Last Pap: Results were: unknown  Obstetric History: G0P0000  Family History:  Family History  Problem Relation Age of Onset  . Hypertension Mother   . Cancer Mother        Luekemia  . Diabetes Mother   . Cancer Unknown        Bladder,Kidney,Prostate  . Uterine cancer Maternal Grandmother     Social History:  Social History   Socioeconomic History  . Marital status: Married    Spouse name: Not on file  . Number of children: Not on file  . Years of education: Not on file  . Highest education level: Not on file  Occupational History    Employer: [id number]    Comment: retired   Scientific laboratory technician  . Financial resource strain: Not on file  . Food insecurity:    Worry: Not on file    Inability: Not on file  . Transportation needs:    Medical: Not on file    Non-medical: Not on file   Tobacco Use  . Smoking status: Never Smoker  . Smokeless tobacco: Never Used  Substance and Sexual Activity  . Alcohol use: No    Alcohol/week: 0.0 oz    Comment: Quit in 04/24/1993  . Drug use: No  . Sexual activity: Yes  Lifestyle  . Physical activity:    Days per week: Not on file    Minutes per session: Not on file  . Stress: Not on file  Relationships  . Social connections:    Talks on phone: Not on file    Gets together: Not on file    Attends religious service: Not on file    Active member of club or organization: Not on file    Attends meetings of clubs or organizations: Not on file    Relationship status: Not on file  . Intimate partner violence:    Fear of current or ex partner: Not on file    Emotionally abused: Not on file    Physically abused: Not on file    Forced sexual activity: Not on file  Other Topics Concern  . Not on file  Social History Narrative  . Not on file    Allergies:  Allergies  Allergen Reactions  . Penicillins  Rash  . Vesicare [Solifenacin] Anaphylaxis    Throat Closed    Medications: Prior to Admission medications   Medication Sig Start Date End Date Taking? Authorizing Provider  Alcohol Swabs (ALCOHOL PREP) PADS Use 1 pad prior to checking blood sugar to clean skin 09/27/15   Mar Daring, PA-C  aspirin EC 81 MG tablet Take 81 mg by mouth daily.     [provider]  Blood Glucose Monitoring Suppl (FREESTYLE LITE) DEVI Check fasting blood sugar q a.m. 02/25/17   Burnette, Clearnce Sorrel, PA-C  clonazePAM (KLONOPIN) 1 MG tablet TAKE ONE TABLET BY MOUTH AT BEDTIME.  MAY TAKE ONE-HALF TABLET EVERY 8 HOURS DURING THE DAY FOR PANIC ATTACKS IF NEEDED 04/23/16   Mar Daring, PA-C  estradiol (ESTRACE) 0.1 MG/GM vaginal cream Place 1 Applicatorful vaginally at bedtime. 09/30/16   Mar Daring, PA-C  glipiZIDE (GLUCOTROL XL) 10 MG 24 hr tablet Take 1 tablet (10 mg total) by mouth daily with breakfast. 08/18/16    Mar Daring, PA-C  Glucosamine-Chondroitin (MOVE FREE PO) Take 1 tablet by mouth daily.    [provider]  glucose blood (FREESTYLE LITE) test strip Check fasting blood sugar q a.m. 11/23/15   Mar Daring, PA-C  ibuprofen (ADVIL,MOTRIN) 200 MG tablet Take 400 mg by mouth at bedtime.    [provider]  Lancets (FREESTYLE) lancets Check fasting blood glucose q a.m 11/23/15   Fenton Malling M, PA-C  lovastatin (MEVACOR) 40 MG tablet Take 1 tablet (40 mg total) by mouth at bedtime. 04/15/17   Mar Daring, PA-C  metFORMIN (GLUCOPHAGE) 1000 MG tablet Take 0.5 tablets (500 mg total) by mouth 2 (two) times daily with a meal. 11/21/16   Burnette, Clearnce Sorrel, PA-C  Multiple Vitamins-Minerals (CENTRUM SILVER PO) Take 1 tablet by mouth daily.    [provider]  nitroGLYCERIN (NITROSTAT) 0.4 MG SL tablet Place 0.4 mg under the tongue every 5 (five) minutes as needed.  02/14/15 02/14/16  [provider]  OLANZapine-FLUoxetine (SYMBYAX) 6-50 MG capsule Take 1 capsule by mouth daily. 04/03/15   Mar Daring, PA-C  PREVNAR 13 SUSP injection  12/01/15   [provider]  propranolol (INDERAL) 10 MG tablet Take 1 tablet (10 mg total) by mouth 2 (two) times daily. 04/15/17   Mar Daring, PA-C  Ubiquinone (ULTRA COQ10 PO) Take 1 tablet by mouth daily.    [provider]    Physical Exam Vitals: Blood pressure 128/70, height 5\' 5"  (1.651 m), weight 184 lb (83.5 kg).  General: NAD HEENT: normocephalic, anicteric Thyroid: no enlargement, no palpable nodules Pulmonary: No increased work of breathing, CTAB Cardiovascular: RRR, distal pulses 2+ Breast: Breast symmetrical, no tenderness, no palpable nodules or masses, no skin or nipple retraction present, no nipple discharge.  No axillary or supraclavicular lymphadenopathy. Abdomen: NABS, soft, non-tender, non-distended.  Umbilicus without lesions.  No hepatomegaly,  splenomegaly or masses palpable. No evidence of hernia  Genitourinary:  External: Normal external female genitalia.  Normal urethral meatus, normal Bartholin's and Skene's glands.    Vagina: Normal vaginal mucosa, no evidence of prolapse.    Cervix: Grossly normal in appearance, no bleeding  Uterus: Non-enlarged, mobile, normal contour.  No CMT  Adnexa: ovaries non-enlarged, no adnexal masses  Rectal: deferred  Lymphatic: no evidence of inguinal lymphadenopathy Extremities: no edema, erythema, or tenderness Neurologic: Grossly intact Psychiatric: mood appropriate, affect full  Female chaperone present for pelvic and breast  portions of the physical exam  Assessment: 67 y.o. G0P0000 routine annual exam  Plan: Problem List Items Addressed This Visit    None      1) Mammogram - recommend yearly screening mammogram.  Mammogram Was ordered today   2) STI screening  wasoffered and declined  3) ASCCP guidelines and rational discussed.  Patient opts for every 3 years screening interval  4) Contraception - the patient is currently using  none.    5) Colonoscopy -- Screening recommended starting at age 64 for average risk individuals, age 58 for individuals deemed at increased risk (including African Americans) and recommended to continue until age 54.  For patient age 10-85 individualized approach is recommended.  Gold standard screening is via colonoscopy, Cologuard screening is an acceptable alternative for patient unwilling or unable to undergo colonoscopy.  "Colorectal cancer screening for average?risk adults: 2018 guideline update from the American Cancer Society"[place]: A Cancer Journal for Clinicians: Jul 09, 2016   6) Routine healthcare maintenance including cholesterol, diabetes screening discussed managed by PCP  7) No follow-ups on file.  Delayed chart clsoure  Adrian Prows MD [facility] 05/07/2017, 3:21 PM

## 2017-05-13 ENCOUNTER — Telehealth: Payer: Self-pay

## 2017-05-13 ENCOUNTER — Ambulatory Visit
Admission: RE | Admit: 2017-05-13 | Discharge: 2017-05-13 | Disposition: A | Discharge status: Home / Self Care | Payer: Medicare Other | Source: Ambulatory Visit | Attending: Physician Assistant | Admitting: Physician Assistant

## 2017-05-13 DIAGNOSIS — N6310 Unspecified lump in the right breast, unspecified quadrant: Secondary | ICD-10-CM | POA: Diagnosis present

## 2017-05-13 DIAGNOSIS — R928 Other abnormal and inconclusive findings on diagnostic imaging of breast: Secondary | ICD-10-CM

## 2017-05-13 DIAGNOSIS — R922 Inconclusive mammogram: Secondary | ICD-10-CM | POA: Diagnosis not present

## 2017-05-13 DIAGNOSIS — N6489 Other specified disorders of breast: Secondary | ICD-10-CM | POA: Diagnosis not present

## 2017-05-13 NOTE — Telephone Encounter (Signed)
-----   Message from Mar Daring, [place] sent at 05/13/2017  3:54 PM EDT ----- Diagnostic imaging and US of the right breast was normal. No signs of breast cancer. Can return to normal screening mammograms.

## 2017-05-13 NOTE — Telephone Encounter (Signed)
Patient advised as below. Patient verbalizes understanding and is in agreement with treatment plan.  

## 2017-05-15 LAB — PAPIG, HPV, RFX 16/18
HPV GENOTYPE, 18: NEGATIVE
HPV Genotype, 16: NEGATIVE
HPV, high-risk: POSITIVE — AB
PAP Smear Comment: 0

## 2017-06-29 DIAGNOSIS — Z79899 Other long term (current) drug therapy: Secondary | ICD-10-CM | POA: Diagnosis not present

## 2017-06-29 DIAGNOSIS — F410 Panic disorder [episodic paroxysmal anxiety] without agoraphobia: Secondary | ICD-10-CM | POA: Diagnosis not present

## 2017-07-08 DIAGNOSIS — I10 Essential (primary) hypertension: Secondary | ICD-10-CM | POA: Diagnosis not present

## 2017-07-08 DIAGNOSIS — E782 Mixed hyperlipidemia: Secondary | ICD-10-CM | POA: Diagnosis not present

## 2017-07-08 DIAGNOSIS — R0602 Shortness of breath: Secondary | ICD-10-CM | POA: Insufficient documentation

## 2017-07-08 DIAGNOSIS — E119 Type 2 diabetes mellitus without complications: Secondary | ICD-10-CM | POA: Diagnosis not present

## 2017-07-14 DIAGNOSIS — R0602 Shortness of breath: Secondary | ICD-10-CM | POA: Diagnosis not present

## 2017-07-15 DIAGNOSIS — E782 Mixed hyperlipidemia: Secondary | ICD-10-CM | POA: Diagnosis not present

## 2017-07-15 DIAGNOSIS — I493 Ventricular premature depolarization: Secondary | ICD-10-CM | POA: Insufficient documentation

## 2017-07-15 DIAGNOSIS — I10 Essential (primary) hypertension: Secondary | ICD-10-CM | POA: Diagnosis not present

## 2017-07-15 DIAGNOSIS — R0602 Shortness of breath: Secondary | ICD-10-CM | POA: Diagnosis not present

## 2017-07-27 DIAGNOSIS — Z79899 Other long term (current) drug therapy: Secondary | ICD-10-CM | POA: Diagnosis not present

## 2017-07-27 DIAGNOSIS — F410 Panic disorder [episodic paroxysmal anxiety] without agoraphobia: Secondary | ICD-10-CM | POA: Diagnosis not present

## 2017-07-30 DIAGNOSIS — R0602 Shortness of breath: Secondary | ICD-10-CM | POA: Diagnosis not present

## 2017-08-05 ENCOUNTER — Encounter: Payer: Self-pay | Admitting: Physician Assistant

## 2017-08-05 ENCOUNTER — Ambulatory Visit (INDEPENDENT_AMBULATORY_CARE_PROVIDER_SITE_OTHER): Payer: Medicare Other | Admitting: Physician Assistant

## 2017-08-05 DIAGNOSIS — J988 Other specified respiratory disorders: Secondary | ICD-10-CM

## 2017-08-05 DIAGNOSIS — R942 Abnormal results of pulmonary function studies: Secondary | ICD-10-CM

## 2017-08-05 DIAGNOSIS — E119 Type 2 diabetes mellitus without complications: Secondary | ICD-10-CM | POA: Diagnosis not present

## 2017-08-05 DIAGNOSIS — F322 Major depressive disorder, single episode, severe without psychotic features: Secondary | ICD-10-CM | POA: Diagnosis not present

## 2017-08-05 DIAGNOSIS — I10 Essential (primary) hypertension: Secondary | ICD-10-CM | POA: Diagnosis not present

## 2017-08-05 DIAGNOSIS — L237 Allergic contact dermatitis due to plants, except food: Secondary | ICD-10-CM | POA: Diagnosis not present

## 2017-08-05 LAB — POCT GLYCOSYLATED HEMOGLOBIN (HGB A1C)
ESTIMATED AVERAGE GLUCOSE: 171
HEMOGLOBIN A1C: 7.6 % — AB (ref 4.0–5.6)

## 2017-08-05 MED ORDER — ALBUTEROL SULFATE HFA 108 (90 BASE) MCG/ACT IN AERS: 2.0000 | INHALATION_SPRAY | Freq: Four times a day (QID) | RESPIRATORY_TRACT | 5 refills | Status: DC | PRN

## 2017-08-05 MED ORDER — PREDNISONE 10 MG (21) PO TBPK: ORAL_TABLET | ORAL | 0 refills | Status: DC

## 2017-08-05 MED ORDER — GLIPIZIDE ER 10 MG PO TB24: 10.0000 mg | ORAL_TABLET | Freq: Every day | ORAL | 3 refills | Status: DC

## 2017-08-05 MED ORDER — METFORMIN HCL 1000 MG PO TABS: 1000.0000 mg | ORAL_TABLET | Freq: Two times a day (BID) | ORAL | 3 refills | Status: DC

## 2017-08-05 NOTE — Progress Notes (Signed)
Patient: Melissa Arroyo Female    DOB: 21-Oct-1950   67 y.o.   MRN: [id number] Visit Date: 08/05/2017  Today's Provider: Mar Daring, PA-C   Chief Complaint  Patient presents with  . Follow-up    T2DM,HTN   Subjective:    HPI  Patient is here for follow up.  Diabetes Mellitus Type II, Follow-up:   Lab Results  Component Value Date   HGBA1C 7.6 (A) 08/05/2017   HGBA1C 6.7 (H) 03/30/2017   HGBA1C 6.5 11/21/2016    She reports excellent compliance with treatment. She is not having side effects.  Current symptoms include none and have been stable. Home blood sugar records: Low 100's.  Episodes of hypoglycemia? unknown   Current Insulin Regimen:  Most Recent Eye Exam: she is scheduled July 27 at [facility] Weight trend: increasing steadily Prior visit with dietician: yes  Current diet: in general, a "healthy" diet   Current exercise: none Reports that she has tried but she gets SOB very easily.  Pertinent Labs:    Component Value Date/Time   CHOL 216 (H) 03/30/2017 1010   TRIG 121 03/30/2017 1010   HDL 93 03/30/2017 1010   LDLCALC 99 03/30/2017 1010   LDLCALC 105 (H) 11/21/2016 1202   CREATININE 0.85 03/30/2017 1010   CREATININE 0.76 11/21/2016 1202    Wt Readings from Last 3 Encounters:  05/07/17 184 lb (83.5 kg)  05/04/17 187 lb (84.8 kg)  03/30/17 175 lb (79.4 kg)    ------------------------------------------------------------------------  Hypertension, follow-up:  BP Readings from Last 3 Encounters:  05/07/17 128/70  05/04/17 140/70  03/30/17 110/68    Management since that visit includes none. Patient reports that lately her blood pressure readings have been high and that at first she was taking 1/2 of the propanolol but now is taking the 10mg . She reports excellent compliance with treatment. She is not having side effects.  She is not exercising. She is adherent to low salt diet.   Outside blood pressures are 170's/90's. She is  experiencing fatigue and SOB. She reports that she also gets light-headed. Patient denies chest pain, chest pressure/discomfort, exertional chest pressure/discomfort, irregular heart beat, lower extremity edema and palpitations.   Cardiovascular risk factors include advanced age (older than 61 for men, 71 for women), diabetes mellitus, hypertension and obesity (BMI >= 30 kg/m2).  Use of agents associated with hypertension: none.     Weight trend: increasing steadily Wt Readings from Last 3 Encounters:  05/07/17 184 lb (83.5 kg)  05/04/17 187 lb (84.8 kg)  03/30/17 175 lb (79.4 kg)    Current diet: in general, a "healthy" diet    ------------------------------------------------------------------------  Patient also reports that she went to see the Cardiologist and was told that her echo was normal. She was also then sent to pulmonology by Dr. Nehemiah Massed for SOB and found to have mild restriction on PFT and given trial of albuterol inhaler for prn use.    Depression screen [facility] 2/9 08/05/2017 03/30/2017 11/21/2016  Decreased Interest 3 0 3  Down, Depressed, Hopeless 3 0 1  PHQ - 2 Score 6 0 4  Altered sleeping 0 0 0  Tired, decreased energy 3 0 3  Change in appetite 0 0 1  Feeling bad or failure about yourself  2 0 0  Trouble concentrating 0 0 3  Moving slowly or fidgety/restless 2 0 1  Suicidal thoughts 1 0 0  PHQ-9 Score 14 0 12  Difficult doing work/chores Very  difficult - Very difficult      Allergies  Allergen Reactions  . Penicillins Rash  . Vesicare [Solifenacin] Anaphylaxis    Throat Closed     Current Outpatient Medications:  .  Alcohol Swabs (ALCOHOL PREP) PADS, Use 1 pad prior to checking blood sugar to clean skin, Disp: 100 each, Rfl: 3 .  Blood Glucose Monitoring Suppl (FREESTYLE LITE) DEVI, Check fasting blood sugar q a.m., Disp: 1 each, Rfl: 0 .  clonazePAM (KLONOPIN) 1 MG tablet, TAKE ONE TABLET BY MOUTH AT BEDTIME.  MAY TAKE ONE-HALF TABLET EVERY 8 HOURS  DURING THE DAY FOR PANIC ATTACKS IF NEEDED, Disp: 180 tablet, Rfl: 1 .  estradiol (ESTRACE) 0.1 MG/GM vaginal cream, Place 1 Applicatorful vaginally at bedtime., Disp: 42.5 g, Rfl: 12 .  FLUoxetine (PROZAC) 40 MG capsule, TK 1 C PO D, Disp: , Rfl: 0 .  glipiZIDE (GLUCOTROL XL) 10 MG 24 hr tablet, Take 1 tablet (10 mg total) by mouth daily with breakfast. (Patient taking differently: Take 10 mg by mouth daily with breakfast. ), Disp: 90 tablet, Rfl: 3 .  Glucosamine-Chondroitin (MOVE FREE PO), Take 1 tablet by mouth daily., Disp: , Rfl:  .  glucose blood (FREESTYLE LITE) test strip, Check fasting blood sugar q a.m., Disp: 100 each, Rfl: 12 .  ibuprofen (ADVIL,MOTRIN) 200 MG tablet, Take 400 mg by mouth at bedtime., Disp: , Rfl:  .  Lancets (FREESTYLE) lancets, Check fasting blood glucose q a.m, Disp: 100 each, Rfl: 12 .  lovastatin (MEVACOR) 40 MG tablet, Take 1 tablet (40 mg total) by mouth at bedtime., Disp: 90 tablet, Rfl: 3 .  metFORMIN (GLUCOPHAGE) 1000 MG tablet, Take 0.5 tablets (500 mg total) by mouth 2 (two) times daily with a meal., Disp: 180 tablet, Rfl: 3 .  Multiple Vitamins-Minerals (CENTRUM SILVER PO), Take 1 tablet by mouth daily., Disp: , Rfl:  .  OLANZapine (ZYPREXA) 5 MG tablet, TK 1 T PO D, Disp: , Rfl: 1 .  propranolol (INDERAL) 10 MG tablet, Take 1 tablet (10 mg total) by mouth 2 (two) times daily., Disp: 180 tablet, Rfl: 3 .  aspirin EC 81 MG tablet, Take 81 mg by mouth daily. , Disp: , Rfl:  .  PREVNAR 13 SUSP injection, , Disp: , Rfl:   Review of Systems  Constitutional: Positive for activity change and fatigue.  HENT: Negative.   Respiratory: Positive for shortness of breath. Negative for cough, chest tightness and wheezing.   Gastrointestinal: Negative for abdominal pain, anal bleeding, blood in stool and constipation.  Genitourinary: Negative for vaginal bleeding.  Neurological: Negative.   Psychiatric/Behavioral: Positive for dysphoric mood. The patient is  nervous/anxious.     Social History   Tobacco Use  . Smoking status: Never Smoker  . Smokeless tobacco: Never Used  Substance Use Topics  . Alcohol use: No    Alcohol/week: 0.0 oz    Comment: Quit in 04/24/1993   Objective:   There were no vitals taken for this visit.   Physical Exam  Constitutional: She appears well-developed and well-nourished. No distress.  Neck: Normal range of motion. Neck supple. No JVD present. No tracheal deviation present. No thyromegaly present.  Cardiovascular: Normal rate, regular rhythm and normal heart sounds. Exam reveals no gallop and no friction rub.  No murmur heard. Pulmonary/Chest: Effort normal and breath sounds normal. No respiratory distress. She has no wheezes. She has no rales.  Lymphadenopathy:    She has no cervical adenopathy.  Skin: She is not diaphoretic.  Psychiatric: Her speech is normal and behavior is normal. Judgment and thought content normal. Cognition and memory are normal. She exhibits a depressed mood.  Vitals reviewed.      Assessment & Plan:     1. Benign essential HTN Stable. Continue Propanolol 10mg .   2. Type 2 diabetes mellitus without complication, without long-term current use of insulin (HCC) A1c up to 7.6. Had only been taking half her meds. Will increase back to full dosing and recheck in 3 months.  - POCT glycosylated hemoglobin (Hb A1C) - metFORMIN (GLUCOPHAGE) 1000 MG tablet; Take 1 tablet (1,000 mg total) by mouth 2 (two) times daily with a meal.  Dispense: 180 tablet; Refill: 3 - glipiZIDE (GLUCOTROL XL) 10 MG 24 hr tablet; Take 1 tablet (10 mg total) by mouth daily with breakfast.  Dispense: 90 tablet; Refill: 3  3. Depression, major, single episode, severe (HCC) Worsening. Already established with psychiatry but desires counseling. Referral placed. Continue meds as prescribed by psychiatry.  - Ambulatory referral to Psychology  4. Poison oak dermatitis Worsening despite OTC treatment. 6 day taper  given as below.  - predniSONE (STERAPRED UNI-PAK 21 TAB) 10 MG (21) TBPK tablet; 6 day taper; take as directed on package instructions  Dispense: 21 tablet; Refill: 0  5. Mild airflow obstruction on pulmonary function test Pulm had recommended but did not send in. I will fill albuterol as below to see if this helps her SOB senses.  - albuterol (PROVENTIL HFA;VENTOLIN HFA) 108 (90 Base) MCG/ACT inhaler; Inhale 2 puffs into the lungs every 6 (six) hours as needed for wheezing or shortness of breath.  Dispense: 1 Inhaler; Refill: [place], PA-C  [facility]

## 2017-08-05 NOTE — Patient Instructions (Signed)
10 Relaxation Techniques That Zap Stress Fast By Jeannette Moninger   Listen  Relax. You deserve it, it's good for you, and it takes less time than you think. You don't need a spa weekend or a retreat. Each of these stress-relieving tips can get you from OMG to om in less than 15 minutes. 1. Meditate  A few minutes of practice per day can help ease anxiety. "Research suggests that daily meditation may alter the brain's neural pathways, making you more resilient to stress," says psychologist Robbie Maller Hartman, PhD, a [place] health and wellness coach. It's simple. Sit up straight with both feet on the floor. Close your eyes. Focus your attention on reciting -- out loud or silently -- a positive mantra such as "I feel at peace" or "I love myself." Place one hand on your belly to sync the mantra with your breaths. Let any distracting thoughts float by like clouds. 2. Breathe Deeply  Take a 5-minute break and focus on your breathing. Sit up straight, eyes closed, with a hand on your belly. Slowly inhale through your nose, feeling the breath start in your abdomen and work its way to the top of your head. Reverse the process as you exhale through your mouth.  "Deep breathing counters the effects of stress by slowing the heart rate and lowering blood pressure," psychologist Judith Tutin, PhD, says. She's a certified life coach in [place], [place] 3. Be Present  Slow down.  "Take 5 minutes and focus on only one behavior with awareness," Tutin says. Notice how the air feels on your face when you're walking and how your feet feel hitting the ground. Enjoy the texture and taste of each bite of food. When you spend time in the moment and focus on your senses, you should feel less tense. 4. Reach Out  Your social network is one of your best tools for handling stress. Talk to others -- preferably face to face, or at least on the phone. Share what's going on. You can get a fresh perspective while keeping your  connection strong. 5. Tune In to Your Body  Mentally scan your body to get a sense of how stress affects it each day. Lie on your back, or sit with your feet on the floor. Start at your toes and work your way up to your scalp, noticing how your body feels.  10 Relaxation Techniques That Zap Stress Fast By Jeannette Moninger   Listen  "Simply be aware of places you feel tight or loose without trying to change anything," Tutin says. For 1 to 2 minutes, imagine each deep breath flowing to that body part. Repeat this process as you move your focus up your body, paying close attention to sensations you feel in each body part. 6. Decompress  Place a warm heat wrap around your neck and shoulders for 10 minutes. Close your eyes and relax your face, neck, upper chest, and back muscles. Remove the wrap, and use a tennis ball or foam roller to massage away tension.  "Place the ball between your back and the wall. Lean into the ball, and hold gentle pressure for up to 15 seconds. Then move the ball to another spot, and apply pressure," says Cathy Benninger, a nurse practitioner and assistant professor at [employer] [facility] in [place]. 7. Laugh Out Loud  A good belly laugh doesn't just lighten the load mentally. It lowers cortisol, your body's stress hormone, and boosts brain chemicals called endorphins, which help   your mood. Lighten up by tuning in to your favorite sitcom or video, reading the comics, or chatting with someone who makes you smile. 8. Crank Up the Tunes  Research shows that listening to soothing music can lower blood pressure, heart rate, and anxiety. "Create a playlist of songs or nature sounds (the ocean, a bubbling brook, birds chirping), and allow your mind to focus on the different melodies, instruments, or singers in the piece," Benninger says. You also can blow off steam by rocking out to more upbeat tunes -- or singing at the top of your lungs! 9. Get Moving   You don't have to run in order to get a runner's high. All forms of exercise, including yoga and walking, can ease depression and anxiety by helping the brain release feel-good chemicals and by giving your body a chance to practice dealing with stress. You can go for a quick walk around the block, take the stairs up and down a few flights, or do some stretching exercises like head rolls and shoulder shrugs. 10. Be Grateful  Keep a gratitude journal or several (one by your bed, one in your purse, and one at work) to help you remember all the things that are good in your life.  "Being grateful for your blessings cancels out negative thoughts and worries," says Joni Emmerling, a wellness coach in [place], [place].  Use these journals to savor good experiences like a child's smile, a sunshine-filled day, and good health. Don't forget to celebrate accomplishments like mastering a new task at work or a new hobby. When you start feeling stressed, spend a few minutes looking through your notes to remind yourself what really matters.  

## 2017-08-06 ENCOUNTER — Telehealth: Payer: Self-pay

## 2017-08-06 NOTE — Telephone Encounter (Signed)
Pt called wanting to let you know she feels 100% today and wanted to thank you for seeing her.   Thanks,   -Mickel Baas

## 2017-08-06 NOTE — Telephone Encounter (Signed)
Oh I am so glad to hear that!

## 2017-08-07 ENCOUNTER — Telehealth: Payer: Self-pay

## 2017-08-07 ENCOUNTER — Ambulatory Visit: Payer: Medicare Other | Admitting: Obstetrics and Gynecology

## 2017-08-07 NOTE — Telephone Encounter (Signed)
Pt states she would like to come by to get a copy of her pap results in march, pt aware its ready for pick up

## 2017-08-24 ENCOUNTER — Other Ambulatory Visit: Payer: Self-pay | Admitting: Physician Assistant

## 2017-08-24 DIAGNOSIS — E119 Type 2 diabetes mellitus without complications: Secondary | ICD-10-CM

## 2017-08-24 DIAGNOSIS — Z79899 Other long term (current) drug therapy: Secondary | ICD-10-CM | POA: Diagnosis not present

## 2017-08-24 DIAGNOSIS — F410 Panic disorder [episodic paroxysmal anxiety] without agoraphobia: Secondary | ICD-10-CM | POA: Diagnosis not present

## 2017-08-24 MED ORDER — GLIPIZIDE ER 10 MG PO TB24: 10.0000 mg | ORAL_TABLET | Freq: Every day | ORAL | 3 refills | Status: DC

## 2017-08-24 NOTE — Telephone Encounter (Signed)
pt needs refill on her   Glipizide 10 mg  She uses [facility]

## 2017-08-27 ENCOUNTER — Ambulatory Visit: Payer: Medicare Other | Admitting: Psychology

## 2017-08-31 DIAGNOSIS — F330 Major depressive disorder, recurrent, mild: Secondary | ICD-10-CM | POA: Diagnosis not present

## 2017-08-31 DIAGNOSIS — Z79899 Other long term (current) drug therapy: Secondary | ICD-10-CM | POA: Diagnosis not present

## 2017-08-31 NOTE — Progress Notes (Signed)
Repeat pap in one year.  Note sent to mychart.

## 2017-09-01 ENCOUNTER — Ambulatory Visit (INDEPENDENT_AMBULATORY_CARE_PROVIDER_SITE_OTHER): Payer: Medicare Other | Admitting: Licensed Clinical Social Worker

## 2017-09-01 DIAGNOSIS — F3341 Major depressive disorder, recurrent, in partial remission: Secondary | ICD-10-CM | POA: Diagnosis not present

## 2017-09-02 DIAGNOSIS — E1165 Type 2 diabetes mellitus with hyperglycemia: Secondary | ICD-10-CM | POA: Diagnosis not present

## 2017-09-02 LAB — HM DIABETES EYE EXAM

## 2017-09-08 ENCOUNTER — Encounter: Payer: Self-pay | Admitting: Physician Assistant

## 2017-10-01 ENCOUNTER — Ambulatory Visit (INDEPENDENT_AMBULATORY_CARE_PROVIDER_SITE_OTHER): Payer: Medicare Other | Admitting: Licensed Clinical Social Worker

## 2017-10-01 DIAGNOSIS — F3341 Major depressive disorder, recurrent, in partial remission: Secondary | ICD-10-CM

## 2017-10-01 DIAGNOSIS — B009 Herpesviral infection, unspecified: Secondary | ICD-10-CM | POA: Diagnosis not present

## 2017-10-01 DIAGNOSIS — D1800 Hemangioma unspecified site: Secondary | ICD-10-CM | POA: Diagnosis not present

## 2017-10-01 DIAGNOSIS — D225 Melanocytic nevi of trunk: Secondary | ICD-10-CM | POA: Diagnosis not present

## 2017-10-01 DIAGNOSIS — D2260 Melanocytic nevi of unspecified upper limb, including shoulder: Secondary | ICD-10-CM | POA: Diagnosis not present

## 2017-10-01 DIAGNOSIS — I788 Other diseases of capillaries: Secondary | ICD-10-CM | POA: Diagnosis not present

## 2017-10-01 DIAGNOSIS — D2270 Melanocytic nevi of unspecified lower limb, including hip: Secondary | ICD-10-CM | POA: Diagnosis not present

## 2017-10-01 DIAGNOSIS — Z1283 Encounter for screening for malignant neoplasm of skin: Secondary | ICD-10-CM | POA: Diagnosis not present

## 2017-10-01 DIAGNOSIS — L821 Other seborrheic keratosis: Secondary | ICD-10-CM | POA: Diagnosis not present

## 2017-10-01 DIAGNOSIS — L812 Freckles: Secondary | ICD-10-CM | POA: Diagnosis not present

## 2017-10-01 DIAGNOSIS — B078 Other viral warts: Secondary | ICD-10-CM | POA: Diagnosis not present

## 2017-10-01 DIAGNOSIS — D2371 Other benign neoplasm of skin of right lower limb, including hip: Secondary | ICD-10-CM | POA: Diagnosis not present

## 2017-10-19 ENCOUNTER — Encounter: Payer: Self-pay | Admitting: Physician Assistant

## 2017-10-19 ENCOUNTER — Ambulatory Visit (INDEPENDENT_AMBULATORY_CARE_PROVIDER_SITE_OTHER): Payer: Medicare Other | Admitting: Physician Assistant

## 2017-10-19 ENCOUNTER — Ambulatory Visit (INDEPENDENT_AMBULATORY_CARE_PROVIDER_SITE_OTHER): Payer: Medicare Other | Admitting: Licensed Clinical Social Worker

## 2017-10-19 VITALS — BP 102/70 | HR 68 | Temp 98.1°F | Resp 16 | Wt 195.0 lb

## 2017-10-19 DIAGNOSIS — Z23 Encounter for immunization: Secondary | ICD-10-CM

## 2017-10-19 DIAGNOSIS — F3341 Major depressive disorder, recurrent, in partial remission: Secondary | ICD-10-CM | POA: Diagnosis not present

## 2017-10-19 DIAGNOSIS — R197 Diarrhea, unspecified: Secondary | ICD-10-CM | POA: Diagnosis not present

## 2017-10-19 DIAGNOSIS — R300 Dysuria: Secondary | ICD-10-CM

## 2017-10-19 LAB — POCT URINALYSIS DIPSTICK
BILIRUBIN UA: NEGATIVE
Blood, UA: NEGATIVE
GLUCOSE UA: NEGATIVE
KETONES UA: NEGATIVE
Leukocytes, UA: NEGATIVE
Nitrite, UA: NEGATIVE
Protein, UA: NEGATIVE
SPEC GRAV UA: 1.01 (ref 1.010–1.025)
UROBILINOGEN UA: 0.2 U/dL
pH, UA: 5 (ref 5.0–8.0)

## 2017-10-19 NOTE — Progress Notes (Signed)
Patient: Melissa Arroyo Female    DOB: 1951/02/02   67 y.o.   MRN: [id number] Visit Date: 10/19/2017  Today's Provider: Mar Daring, PA-C   Chief Complaint  Patient presents with  . Urinary Tract Infection   Subjective:    HPI Urinary Tract Infection: Patient complains of abnormal smelling urine and burning with urination She has had symptoms for 5 days. Patient also complains of lower abdominal pain. Patient denies fever. Patient does have a history of recurrent UTI.  Patient does have a history of pyelonephritis.   Patient C/O loose stools and diarrhea x's six weeks. Patient reports abdominal pain on and off. Patient reports stools are watery at times, denies any blood. Patient reports taking OTC medications and reports mild symptom control.      Allergies  Allergen Reactions  . Penicillins Rash  . Vesicare [Solifenacin] Anaphylaxis    Throat Closed     Current Outpatient Medications:  .  Alcohol Swabs (ALCOHOL PREP) PADS, Use 1 pad prior to checking blood sugar to clean skin, Disp: 100 each, Rfl: 3 .  Blood Glucose Monitoring Suppl (FREESTYLE LITE) DEVI, Check fasting blood sugar q a.m., Disp: 1 each, Rfl: 0 .  Brexpiprazole (REXULTI) 2 MG TABS, Take 1 tablet by mouth daily., Disp: , Rfl:  .  clonazePAM (KLONOPIN) 1 MG tablet, TAKE ONE TABLET BY MOUTH AT BEDTIME.  MAY TAKE ONE-HALF TABLET EVERY 8 HOURS DURING THE DAY FOR PANIC ATTACKS IF NEEDED, Disp: 180 tablet, Rfl: 1 .  estradiol (ESTRACE) 0.1 MG/GM vaginal cream, Place 1 Applicatorful vaginally at bedtime., Disp: 42.5 g, Rfl: 12 .  FLUoxetine (PROZAC) 40 MG capsule, TK 1 C PO D, Disp: , Rfl: 0 .  glipiZIDE (GLUCOTROL XL) 10 MG 24 hr tablet, Take 1 tablet (10 mg total) by mouth daily with breakfast., Disp: 90 tablet, Rfl: 3 .  Glucosamine-Chondroitin (MOVE FREE PO), Take 1 tablet by mouth daily., Disp: , Rfl:  .  glucose blood (FREESTYLE LITE) test strip, Check fasting blood sugar q a.m., Disp: 100 each, Rfl:  12 .  ibuprofen (ADVIL,MOTRIN) 200 MG tablet, Take 400 mg by mouth at bedtime., Disp: , Rfl:  .  Lancets (FREESTYLE) lancets, Check fasting blood glucose q a.m, Disp: 100 each, Rfl: 12 .  lovastatin (MEVACOR) 40 MG tablet, Take 1 tablet (40 mg total) by mouth at bedtime., Disp: 90 tablet, Rfl: 3 .  metFORMIN (GLUCOPHAGE) 1000 MG tablet, Take 1 tablet (1,000 mg total) by mouth 2 (two) times daily with a meal., Disp: 180 tablet, Rfl: 3 .  Multiple Vitamins-Minerals (CENTRUM SILVER PO), Take 1 tablet by mouth daily., Disp: , Rfl:  .  OLANZapine (ZYPREXA) 5 MG tablet, TK 1 T PO D, Disp: , Rfl: 1 .  propranolol (INDERAL) 10 MG tablet, Take 1 tablet (10 mg total) by mouth 2 (two) times daily., Disp: 180 tablet, Rfl: 3  Review of Systems  Constitutional: Positive for appetite change.  Respiratory: Negative.   Cardiovascular: Negative.   Gastrointestinal: Positive for abdominal pain and diarrhea.  Genitourinary: Positive for dysuria.  Neurological: Negative.     Social History   Tobacco Use  . Smoking status: Never Smoker  . Smokeless tobacco: Never Used  Substance Use Topics  . Alcohol use: No    Alcohol/week: 0.0 standard drinks    Comment: Quit in 04/24/1993   Objective:   BP 102/70 (BP Location: Left Arm, Patient Position: Sitting, Cuff Size: Normal)   Pulse 68  Temp 98.1 F (36.7 C) (Oral)   Resp 16   Wt 195 lb (88.5 kg)   SpO2 98%   BMI 32.45 kg/m  Vitals:   10/19/17 0852  BP: 102/70  Pulse: 68  Resp: 16  Temp: 98.1 F (36.7 C)  TempSrc: Oral  SpO2: 98%  Weight: 195 lb (88.5 kg)     Physical Exam  Constitutional: She is oriented to person, place, and time. She appears well-developed and well-nourished. No distress.  Cardiovascular: Normal rate, regular rhythm and normal heart sounds. Exam reveals no gallop and no friction rub.  No murmur heard. Pulmonary/Chest: Effort normal and breath sounds normal. No respiratory distress. She has no wheezes. She has no rales.    Abdominal: Soft. Normal appearance and bowel sounds are normal. She exhibits no distension and no mass. There is no hepatosplenomegaly. There is generalized tenderness. There is no rebound, no guarding and no CVA tenderness.  Neurological: She is alert and oriented to person, place, and time.  Skin: Skin is warm and dry. She is not diaphoretic.  Vitals reviewed.     Assessment & Plan:     1. Burning with urination Urine positive for strep B. Will send bactrim for treatment since patient is having symptoms. She is to call if symptoms fail to improve. - POCT urinalysis dipstick - Urine Culture - sulfamethoxazole-trimethoprim (BACTRIM DS,SEPTRA DS) 800-160 MG tablet; Take 1 tablet by mouth 2 (two) times daily.  Dispense: 10 tablet; Refill: 0  2. Diarrhea, unspecified type DDx: colitis, IBS D, IBD. Wil check labs and obtain stool cultures as below. If negative may consider medications for IBS-D as trial. Also discussed adding probiotics. Using imodium and pepto bismol without relief. Imodium normally helps her diarrhea. Push fluids. Diet information to lessen diarrhea also given to patient. I will f/u pending stool results.  - CBC w/Diff/Platelet - Comprehensive Metabolic Panel (CMET) - Stool Culture - Stool C-Diff Toxin Assay  3. Need for influenza vaccination Flu vaccine given today without complication. Patient sat upright for 15 minutes to check for adverse reaction before being released. - Flu vaccine HIGH DOSE PF       Mar Daring, PA-C  [facility]

## 2017-10-19 NOTE — Patient Instructions (Signed)
Food Choices to Help Relieve Diarrhea, Adult  When you have diarrhea, the foods you eat and your eating habits are very important. Choosing the right foods and drinks can help:  · Relieve diarrhea.  · Replace lost fluids and nutrients.  · Prevent dehydration.    What general guidelines should I follow?  Relieving diarrhea  · Choose foods with less than 2 g or .07 oz. of fiber per serving.  · Limit fats to less than 8 tsp (38 g or 1.34 oz.) a day.  · Avoid the following:  ? Foods and beverages sweetened with high-fructose corn syrup, honey, or sugar alcohols such as xylitol, sorbitol, and mannitol.  ? Foods that contain a lot of fat or sugar.  ? Fried, greasy, or spicy foods.  ? High-fiber grains, breads, and cereals.  ? Raw fruits and vegetables.  · Eat foods that are rich in probiotics. These foods include dairy products such as yogurt and fermented milk products. They help increase healthy bacteria in the stomach and intestines (gastrointestinal tract, or GI tract).  · If you have lactose intolerance, avoid dairy products. These may make your diarrhea worse.  · Take medicine to help stop diarrhea (antidiarrheal medicine) only as told by your health care provider.  Replacing nutrients  · Eat small meals or snacks every 3–4 hours.  · Eat bland foods, such as white rice, toast, or baked potato, until your diarrhea starts to get better. Gradually reintroduce nutrient-rich foods as tolerated or as told by your health care provider. This includes:  ? Well-cooked protein foods.  ? Peeled, seeded, and soft-cooked fruits and vegetables.  ? Low-fat dairy products.  · Take vitamin and mineral supplements as told by your health care provider.  Preventing dehydration    · Start by sipping water or a special solution to prevent dehydration (oral rehydration solution, ORS). Urine that is clear or pale yellow means that you are getting enough fluid.  · Try to drink at least 8–10 cups of fluid each day to help replace lost  fluids.  · You may add other liquids in addition to water, such as clear juice or decaffeinated sports drinks, as tolerated or as told by your health care provider.  · Avoid drinks with caffeine, such as coffee, tea, or soft drinks.  · Avoid alcohol.  What foods are recommended?  The items listed may not be a complete list. Talk with your health care provider about what dietary choices are best for you.  Grains  White rice. White, French, or pita breads (fresh or toasted), including plain rolls, buns, or bagels. White pasta. Saltine, soda, or graham crackers. Pretzels. Low-fiber cereal. Cooked cereals made with water (such as cornmeal, farina, or cream cereals). Plain muffins. Matzo. Melba toast. Zwieback.  Vegetables  Potatoes (without the skin). Most well-cooked and canned vegetables without skins or seeds. Tender lettuce.  Fruits  Apple sauce. Fruits canned in juice. Cooked apricots, cherries, grapefruit, peaches, pears, or plums. Fresh bananas and cantaloupe.  Meats and other protein foods  Baked or boiled chicken. Eggs. Tofu. Fish. Seafood. Smooth nut butters. Ground or well-cooked tender beef, ham, veal, lamb, pork, or poultry.  Dairy  Plain yogurt, kefir, and unsweetened liquid yogurt. Lactose-free milk, buttermilk, skim milk, or soy milk. Low-fat or nonfat hard cheese.  Beverages  Water. Low-calorie sports drinks. Fruit juices without pulp. Strained tomato and vegetable juices. Decaffeinated teas. Sugar-free beverages not sweetened with sugar alcohols. Oral rehydration solutions, if approved by your health care   provider.  Seasoning and other foods  Bouillon, broth, or soups made from recommended foods.  What foods are not recommended?  The items listed may not be a complete list. Talk with your health care provider about what dietary choices are best for you.  Grains  Whole grain, whole wheat, bran, or rye breads, rolls, pastas, and crackers. Wild or brown rice. Whole grain or bran cereals. Barley. Oats  and oatmeal. Corn tortillas or taco shells. Granola. Popcorn.  Vegetables  Raw vegetables. Fried vegetables. Cabbage, broccoli, Brussels sprouts, artichokes, baked beans, beet greens, corn, kale, legumes, peas, sweet potatoes, and yams. Potato skins. Cooked spinach and cabbage.  Fruits  Dried fruit, including raisins and dates. Raw fruits. Stewed or dried prunes. Canned fruits with syrup.  Meat and other protein foods  Fried or fatty meats. Deli meats. Chunky nut butters. Nuts and seeds. Beans and lentils. Bacon. Hot dogs. Sausage.  Dairy  High-fat cheeses. Whole milk, chocolate milk, and beverages made with milk, such as milk shakes. Half-and-half. Cream. sour cream. Ice cream.  Beverages  Caffeinated beverages (such as coffee, tea, soda, or energy drinks). Alcoholic beverages. Fruit juices with pulp. Prune juice. Soft drinks sweetened with high-fructose corn syrup or sugar alcohols. High-calorie sports drinks.  Fats and oils  Butter. Cream sauces. Margarine. Salad oils. Plain salad dressings. Olives. Avocados. Mayonnaise.  Sweets and desserts  Sweet rolls, doughnuts, and sweet breads. Sugar-free desserts sweetened with sugar alcohols such as xylitol and sorbitol.  Seasoning and other foods  Honey. Hot sauce. Chili powder. Gravy. Cream-based or milk-based soups. Pancakes and waffles.  Summary  · When you have diarrhea, the foods you eat and your eating habits are very important.  · Make sure you get at least 8–10 cups of fluid each day, or enough to keep your urine clear or pale yellow.  · Eat bland foods and gradually reintroduce healthy, nutrient-rich foods as tolerated, or as told by your health care provider.  · Avoid high-fiber, fried, greasy, or spicy foods.  This information is not intended to replace advice given to you by your health care provider. Make sure you discuss any questions you have with your health care provider.  Document Released: 04/19/2003 Document Revised: 01/25/2016 Document  Reviewed: 01/25/2016  Elsevier Interactive Patient Education © 2018 Elsevier Inc.

## 2017-10-20 ENCOUNTER — Telehealth: Payer: Self-pay

## 2017-10-20 LAB — CBC WITH DIFFERENTIAL/PLATELET
BASOS ABS: 0.1 10*3/uL (ref 0.0–0.2)
Basos: 1 %
EOS (ABSOLUTE): 0.4 10*3/uL (ref 0.0–0.4)
EOS: 5 %
HEMATOCRIT: 39.8 % (ref 34.0–46.6)
HEMOGLOBIN: 13.1 g/dL (ref 11.1–15.9)
IMMATURE GRANS (ABS): 0 10*3/uL (ref 0.0–0.1)
Immature Granulocytes: 0 %
LYMPHS: 36 %
Lymphocytes Absolute: 3 10*3/uL (ref 0.7–3.1)
MCH: 31.6 pg (ref 26.6–33.0)
MCHC: 32.9 g/dL (ref 31.5–35.7)
MCV: 96 fL (ref 79–97)
MONOCYTES: 7 %
Monocytes Absolute: 0.6 10*3/uL (ref 0.1–0.9)
NEUTROS ABS: 4.4 10*3/uL (ref 1.4–7.0)
Neutrophils: 51 %
Platelets: 301 10*3/uL (ref 150–450)
RBC: 4.15 x10E6/uL (ref 3.77–5.28)
RDW: 13.1 % (ref 12.3–15.4)
WBC: 8.5 10*3/uL (ref 3.4–10.8)

## 2017-10-20 LAB — COMPREHENSIVE METABOLIC PANEL
ALBUMIN: 4.3 g/dL (ref 3.6–4.8)
ALK PHOS: 88 IU/L (ref 39–117)
ALT: 33 IU/L — ABNORMAL HIGH (ref 0–32)
AST: 21 IU/L (ref 0–40)
Albumin/Globulin Ratio: 1.5 (ref 1.2–2.2)
BILIRUBIN TOTAL: 0.3 mg/dL (ref 0.0–1.2)
BUN/Creatinine Ratio: 20 (ref 12–28)
BUN: 16 mg/dL (ref 8–27)
CHLORIDE: 99 mmol/L (ref 96–106)
CO2: 23 mmol/L (ref 20–29)
Calcium: 10 mg/dL (ref 8.7–10.3)
Creatinine, Ser: 0.81 mg/dL (ref 0.57–1.00)
GFR calc Af Amer: 88 mL/min/{1.73_m2} (ref >59)
GFR, EST NON AFRICAN AMERICAN: 76 mL/min/{1.73_m2} (ref >59)
GLOBULIN, TOTAL: 2.8 g/dL (ref 1.5–4.5)
GLUCOSE: 156 mg/dL — AB (ref 65–99)
POTASSIUM: 4.7 mmol/L (ref 3.5–5.2)
SODIUM: 138 mmol/L (ref 134–144)
TOTAL PROTEIN: 7.1 g/dL (ref 6.0–8.5)

## 2017-10-20 NOTE — Telephone Encounter (Signed)
Patient advised as directed below.  Thanks,  -Toyoko Silos 

## 2017-10-20 NOTE — Telephone Encounter (Signed)
-----   Message from Mar Daring, PA-C sent at 10/20/2017 10:20 AM EDT ----- Labs are ok. Sugar up but that is because it was a non-fasting lab. Will await stool testing.

## 2017-10-21 ENCOUNTER — Telehealth: Payer: Self-pay

## 2017-10-21 DIAGNOSIS — R197 Diarrhea, unspecified: Secondary | ICD-10-CM | POA: Diagnosis not present

## 2017-10-21 LAB — URINE CULTURE

## 2017-10-21 MED ORDER — SULFAMETHOXAZOLE-TRIMETHOPRIM 800-160 MG PO TABS: 1.0000 | ORAL_TABLET | Freq: Two times a day (BID) | ORAL | 0 refills | Status: DC

## 2017-10-21 NOTE — Telephone Encounter (Signed)
-----   Message from Mar Daring, [place] sent at 10/21/2017 12:49 PM EDT ----- Urine culture positive for strep b. This is susceptible to all antibiotics. I will send in antibiotic for you.

## 2017-10-21 NOTE — Telephone Encounter (Signed)
Patient advised as below. Patient verbalizes understanding and is in agreement with treatment plan.  

## 2017-10-24 LAB — CLOSTRIDIUM DIFFICILE EIA: C difficile Toxins A+B, EIA: NEGATIVE

## 2017-10-26 ENCOUNTER — Telehealth: Payer: Self-pay

## 2017-10-26 LAB — STOOL CULTURE: E coli, Shiga toxin Assay: NEGATIVE

## 2017-10-26 NOTE — Telephone Encounter (Signed)
Ok perfect

## 2017-10-26 NOTE — Telephone Encounter (Signed)
-----   Message from Mar Daring, PA-C sent at 10/26/2017  8:56 AM EDT ----- Stool cultures are negative for salmonella, shigella, e.coli, campylobacter and c.diff. We can try medication for IBS-D if you prefer fist to see if symptoms improve.

## 2017-10-26 NOTE — Telephone Encounter (Signed)
Patient advised as directed below. Patient reports that she is feeling much better.  Thanks,  -Remy Dia

## 2017-11-02 ENCOUNTER — Ambulatory Visit: Payer: Medicare Other | Admitting: Licensed Clinical Social Worker

## 2017-11-05 ENCOUNTER — Encounter: Payer: Self-pay | Admitting: Physician Assistant

## 2017-11-05 ENCOUNTER — Ambulatory Visit (INDEPENDENT_AMBULATORY_CARE_PROVIDER_SITE_OTHER): Payer: Medicare Other | Admitting: Physician Assistant

## 2017-11-05 ENCOUNTER — Ambulatory Visit: Payer: Self-pay | Admitting: Physician Assistant

## 2017-11-05 VITALS — BP 100/80 | HR 78 | Temp 97.5°F | Resp 16 | Wt 193.6 lb

## 2017-11-05 DIAGNOSIS — K580 Irritable bowel syndrome with diarrhea: Secondary | ICD-10-CM | POA: Diagnosis not present

## 2017-11-05 DIAGNOSIS — E119 Type 2 diabetes mellitus without complications: Secondary | ICD-10-CM | POA: Diagnosis not present

## 2017-11-05 LAB — POCT GLYCOSYLATED HEMOGLOBIN (HGB A1C)
Est. average glucose Bld gHb Est-mCnc: 160
HEMOGLOBIN A1C: 7.2 % — AB (ref 4.0–5.6)

## 2017-11-05 LAB — POCT UA - MICROALBUMIN: MICROALBUMIN (UR) POC: 20 mg/L

## 2017-11-05 MED ORDER — ELUXADOLINE 75 MG PO TABS: 1.0000 | ORAL_TABLET | Freq: Two times a day (BID) | ORAL | 5 refills | Status: DC

## 2017-11-05 NOTE — Patient Instructions (Signed)
Eluxadoline oral tablets What is this medicine? ELUXADOLINE (ee LUX ah dol ine) is an intestinal disorder drug. It is used to treat irritable bowel syndrome with diarrhea. This medicine may be used for other purposes; ask your health care provider or pharmacist if you have questions. COMMON BRAND NAME(S): Viberzi What should I tell my health care provider before I take this medicine? They need to know if you have any of these conditions: -drink more than 3 alcohol-containing drinks per day -gallbladder disease -have no gallbladder -history of constipation -history of drug or alcohol abuse problems -history of pancreatitis or pancreatic disease -liver disease -stomach or intestine problems -an unusual or allergic reaction to eluxadoline, other medicines, foods, dyes, or preservatives -pregnant or trying to get pregnant -breast-feeding How should I use this medicine? Take this medicine by mouth with a glass of water. Follow the directions on the prescription label. Take this medicine with food. Take your medicine at regular intervals. Do not take it more often than directed. Do not stop taking except on your doctor's advice. Talk to your pediatrician regarding the use of this medicine in children. Special care may be needed. Overdosage: If you think you have taken too much of this medicine contact a poison control center or emergency room at once. NOTE: This medicine is only for you. Do not share this medicine with others. What if I miss a dose? If you miss a dose, take it as soon as you can. If it is almost time for your next dose, take only that dose. Do not take double or extra doses. What may interact with this medicine? This medicine may interact with the following medications: -alosetron -anticholinergics -bupropion -certain antibiotics like ciprofloxacin, clarithromycin, rifampin -cyclosporine -eltrombopag -ergot alkaloids like dihydroergotamine, ergonovine, ergotamine,  methylergonovine -fluconazole -gemfibrozil -loperamide -opiate agonist -paroxetine -pimozide -probenecid -quinidine -rosuvastatin -sirolimus -tacrolimus This list may not describe all possible interactions. Give your health care provider a list of all the medicines, herbs, non-prescription drugs, or dietary supplements you use. Also tell them if you smoke, drink alcohol, or use illegal drugs. Some items may interact with your medicine. What should I watch for while using this medicine? Tell your doctor or healthcare professional if your symptoms do not start to get better or if they get worse. This medicine may cause constipation. If you do not have a bowel movement, call your doctor or healthcare professional. You may get drowsy or dizzy. Do not drive, use machinery, or do anything that needs mental alertness until you know how this medicine affects you. Do not stand or sit up quickly, especially if you are an older patient. This reduces the risk of dizzy or fainting spells. Alcohol may interfere with the effect of this medicine. Avoid alcoholic drinks. What side effects may I notice from receiving this medicine? Side effects that you should report to your doctor or health care professional as soon as possible: -allergic reactions like skin rash, itching or hives, swelling of the face, lips, or tongue -breathing problems -constipation -right upper belly pain Side effects that usually do not require medical attention (report to your doctor or health care professional if they continue or are bothersome): -dizziness -nausea, vomiting -tiredness This list may not describe all possible side effects. Call your doctor for medical advice about side effects. You may report side effects to FDA at 1-800-FDA-1088. Where should I keep my medicine? Keep out of the reach of children. Store at room temperature between 20 and 25 degrees C (68 and  77 degrees F). Throw away any unused medicine after the  expiration date. NOTE: This sheet is a summary. It may not cover all possible information. If you have questions about this medicine, talk to your doctor, pharmacist, or health care provider.  2018 Elsevier/Gold Standard (2015-04-26 11:03:55)

## 2017-11-05 NOTE — Progress Notes (Signed)
Patient: Melissa Arroyo Female    DOB: 1950/04/16   67 y.o.   MRN: [id number] Visit Date: 11/05/2017  Today's Provider: Mar Daring, PA-C   Chief Complaint  Patient presents with  . Follow-up    Deprssion and DM   Subjective:    HPI  Diabetes Mellitus Type II, Follow-up:   Lab Results  Component Value Date   HGBA1C 7.6 (A) 08/05/2017   HGBA1C 6.7 (H) 03/30/2017   HGBA1C 6.5 11/21/2016    Last seen for diabetes 3 months ago.  Management since then includes increase back to full dose of Metformin. She reports excellent compliance with treatment. She is not having side effects.  Current symptoms include none and have been stable. Home blood sugar records: not checking  Episodes of hypoglycemia? unknown   Current Insulin Regimen: n/a Most Recent Eye Exam: 07/27 at [facility] Weight trend: stable Prior visit with dietician: yes -  Current diet: "I just eat what I can" Current exercise: walking  Pertinent Labs:    Component Value Date/Time   CHOL 216 (H) 03/30/2017 1010   TRIG 121 03/30/2017 1010   HDL 93 03/30/2017 1010   LDLCALC 99 03/30/2017 1010   LDLCALC 105 (H) 11/21/2016 1202   CREATININE 0.81 10/19/2017 0950   CREATININE 0.76 11/21/2016 1202    Wt Readings from Last 3 Encounters:  11/05/17 193 lb 9.6 oz (87.8 kg)  10/19/17 195 lb (88.5 kg)  05/07/17 184 lb (83.5 kg)    ------------------------------------------------------------------------ Depression: Patient here for 3 month follow up. Patient reports that she went to counseling and did 3 visit and reports that she went back to [employer].Patient is currently taking Olanzapine 5 mg, Fluoxetine 40mg ,Klonopin 1mg .Patient reports doing better with her symptoms.  Depression screen [place] 2/9 11/05/2017 08/05/2017 03/30/2017  Decreased Interest 0 3 0  Down, Depressed, Hopeless 0 3 0  PHQ - 2 Score 0 6 0  Altered sleeping 0 0 0  Tired, decreased energy 0 3 0  Change in appetite 0 0 0    Feeling bad or failure about yourself  1 2 0  Trouble concentrating 0 0 0  Moving slowly or fidgety/restless 0 2 0  Suicidal thoughts 0 1 0  PHQ-9 Score 1 14 0  Difficult doing work/chores Not difficult at all Very difficult -      Allergies  Allergen Reactions  . Penicillins Rash  . Vesicare [Solifenacin] Anaphylaxis    Throat Closed     Current Outpatient Medications:  .  Alcohol Swabs (ALCOHOL PREP) PADS, Use 1 pad prior to checking blood sugar to clean skin, Disp: 100 each, Rfl: 3 .  Blood Glucose Monitoring Suppl (FREESTYLE LITE) DEVI, Check fasting blood sugar q a.m., Disp: 1 each, Rfl: 0 .  Brexpiprazole (REXULTI) 2 MG TABS, Take 1 tablet by mouth daily., Disp: , Rfl:  .  clonazePAM (KLONOPIN) 1 MG tablet, TAKE ONE TABLET BY MOUTH AT BEDTIME.  MAY TAKE ONE-HALF TABLET EVERY 8 HOURS DURING THE DAY FOR PANIC ATTACKS IF NEEDED, Disp: 180 tablet, Rfl: 1 .  estradiol (ESTRACE) 0.1 MG/GM vaginal cream, Place 1 Applicatorful vaginally at bedtime., Disp: 42.5 g, Rfl: 12 .  FLUoxetine (PROZAC) 40 MG capsule, TK 1 C PO D, Disp: , Rfl: 0 .  glipiZIDE (GLUCOTROL XL) 10 MG 24 hr tablet, Take 1 tablet (10 mg total) by mouth daily with breakfast., Disp: 90 tablet, Rfl: 3 .  Glucosamine-Chondroitin (MOVE FREE PO), Take 1 tablet  by mouth daily., Disp: , Rfl:  .  ibuprofen (ADVIL,MOTRIN) 200 MG tablet, Take 400 mg by mouth at bedtime., Disp: , Rfl:  .  lovastatin (MEVACOR) 40 MG tablet, Take 1 tablet (40 mg total) by mouth at bedtime., Disp: 90 tablet, Rfl: 3 .  metFORMIN (GLUCOPHAGE) 1000 MG tablet, Take 1 tablet (1,000 mg total) by mouth 2 (two) times daily with a meal., Disp: 180 tablet, Rfl: 3 .  Multiple Vitamins-Minerals (CENTRUM SILVER PO), Take 1 tablet by mouth daily., Disp: , Rfl:  .  OLANZapine (ZYPREXA) 5 MG tablet, TK 1 T PO D, Disp: , Rfl: 1 .  propranolol (INDERAL) 10 MG tablet, Take 1 tablet (10 mg total) by mouth 2 (two) times daily., Disp: 180 tablet, Rfl: 3 .  glucose blood  (FREESTYLE LITE) test strip, Check fasting blood sugar q a.m., Disp: 100 each, Rfl: 12 .  Lancets (FREESTYLE) lancets, Check fasting blood glucose q a.m, Disp: 100 each, Rfl: 12  Review of Systems  Constitutional: Negative.   Respiratory: Negative.   Cardiovascular: Negative for chest pain, palpitations and leg swelling.  Gastrointestinal: Positive for abdominal pain (intermittent cramping) and diarrhea (3 times per day; watery).  Neurological: Negative.   Psychiatric/Behavioral: Negative for self-injury, sleep disturbance and suicidal ideas.    Social History   Tobacco Use  . Smoking status: Never Smoker  . Smokeless tobacco: Never Used  Substance Use Topics  . Alcohol use: No    Alcohol/week: 0.0 standard drinks    Comment: Quit in 04/24/1993   Objective:   BP 100/80 (BP Location: Left Arm, Patient Position: Sitting, Cuff Size: Normal)   Pulse 78   Temp (!) 97.5 F (36.4 C) (Oral)   Resp 16   Wt 193 lb 9.6 oz (87.8 kg)   SpO2 98%   BMI 32.22 kg/m  Vitals:   11/05/17 0829  BP: 100/80  Pulse: 78  Resp: 16  Temp: (!) 97.5 F (36.4 C)  TempSrc: Oral  SpO2: 98%  Weight: 193 lb 9.6 oz (87.8 kg)     Physical Exam  Constitutional: She is oriented to person, place, and time. She appears well-developed and well-nourished. No distress.  Cardiovascular: Normal rate, regular rhythm and normal heart sounds. Exam reveals no gallop and no friction rub.  No murmur heard. Pulmonary/Chest: Effort normal and breath sounds normal. No respiratory distress. She has no wheezes. She has no rales.  Abdominal: Soft. Normal appearance and bowel sounds are normal. She exhibits no distension and no mass. There is no hepatosplenomegaly. There is no tenderness. There is no rebound, no guarding and no CVA tenderness.  Neurological: She is alert and oriented to person, place, and time.  Skin: Skin is warm and dry. She is not diaphoretic.  Vitals reviewed.      Assessment & Plan:     1.  Type 2 diabetes mellitus without complication, without long-term current use of insulin (HCC) Urine microalbumin normal at 20, improved last year was 50. A1c down to 7.2. Continue medications and will f/u in 5 months for AWV/CPE.  - POCT glycosylated hemoglobin (Hb A1C) - POCT UA - Microalbumin  2. Irritable bowel syndrome with diarrhea Worsening. Tried Imodium and Bentyl in the past without complete relief. Also tried xifaxan without resolution. Will try Viberzi as below. Call if no improvements or constipation occurs.  - Eluxadoline (VIBERZI) 75 MG TABS; Take 1 tablet by mouth 2 (two) times daily with a meal.  Dispense: 60 tablet; Refill: 5  Mar Daring, PA-C  [facility]

## 2017-11-16 ENCOUNTER — Ambulatory Visit: Payer: Medicare Other | Admitting: Licensed Clinical Social Worker

## 2017-11-30 ENCOUNTER — Ambulatory Visit: Payer: Medicare Other | Admitting: Licensed Clinical Social Worker

## 2017-12-15 DIAGNOSIS — F410 Panic disorder [episodic paroxysmal anxiety] without agoraphobia: Secondary | ICD-10-CM | POA: Diagnosis not present

## 2017-12-15 DIAGNOSIS — Z79899 Other long term (current) drug therapy: Secondary | ICD-10-CM | POA: Diagnosis not present

## 2017-12-23 ENCOUNTER — Encounter: Payer: Self-pay | Admitting: Physician Assistant

## 2017-12-23 ENCOUNTER — Ambulatory Visit (INDEPENDENT_AMBULATORY_CARE_PROVIDER_SITE_OTHER): Payer: Medicare Other | Admitting: Physician Assistant

## 2017-12-23 VITALS — BP 141/79 | HR 73 | Temp 98.2°F | Resp 16 | Wt 196.1 lb

## 2017-12-23 DIAGNOSIS — R300 Dysuria: Secondary | ICD-10-CM | POA: Diagnosis not present

## 2017-12-23 DIAGNOSIS — N3000 Acute cystitis without hematuria: Secondary | ICD-10-CM | POA: Diagnosis not present

## 2017-12-23 LAB — POCT URINALYSIS DIPSTICK
BILIRUBIN UA: NEGATIVE
Glucose, UA: NEGATIVE
Ketones, UA: NEGATIVE
NITRITE UA: NEGATIVE
ODOR: POSITIVE
PH UA: 7 (ref 5.0–8.0)
PROTEIN UA: NEGATIVE
RBC UA: NEGATIVE
Spec Grav, UA: 1.01 (ref 1.010–1.025)
UROBILINOGEN UA: 0.2 U/dL

## 2017-12-23 MED ORDER — NITROFURANTOIN MONOHYD MACRO 100 MG PO CAPS: 100.0000 mg | ORAL_CAPSULE | Freq: Two times a day (BID) | ORAL | 0 refills | Status: DC

## 2017-12-23 NOTE — Patient Instructions (Signed)

## 2017-12-23 NOTE — Progress Notes (Signed)
Patient: Melissa Arroyo Female    DOB: 09-09-1950   67 y.o.   MRN: [id number] Visit Date: 12/23/2017  Today's Provider: Mar Daring, PA-C   Chief Complaint  Patient presents with  . Urinary Tract Infection   Subjective:    Urinary Tract Infection   This is a new problem. The current episode started yesterday. The problem occurs every urination. The problem has been unchanged. The quality of the pain is described as burning. The pain is mild. There has been no fever. She is not sexually active. There is no history of pyelonephritis. Associated symptoms include frequency and nausea ("last night"). Pertinent negatives include no chills, discharge, hematuria, urgency or vomiting. She has tried nothing for the symptoms. Her past medical history is significant for recurrent UTIs.       Allergies  Allergen Reactions  . Penicillins Rash  . Vesicare [Solifenacin] Anaphylaxis    Throat Closed     Current Outpatient Medications:  .  Brexpiprazole (REXULTI) 2 MG TABS, Take 1 tablet by mouth daily., Disp: , Rfl:  .  clonazePAM (KLONOPIN) 1 MG tablet, TAKE ONE TABLET BY MOUTH AT BEDTIME.  MAY TAKE ONE-HALF TABLET EVERY 8 HOURS DURING THE DAY FOR PANIC ATTACKS IF NEEDED, Disp: 180 tablet, Rfl: 1 .  estradiol (ESTRACE) 0.1 MG/GM vaginal cream, Place 1 Applicatorful vaginally at bedtime., Disp: 42.5 g, Rfl: 12 .  FLUoxetine (PROZAC) 40 MG capsule, TK 1 C PO D, Disp: , Rfl: 0 .  glipiZIDE (GLUCOTROL XL) 10 MG 24 hr tablet, Take 1 tablet (10 mg total) by mouth daily with breakfast., Disp: 90 tablet, Rfl: 3 .  ibuprofen (ADVIL,MOTRIN) 200 MG tablet, Take 400 mg by mouth at bedtime., Disp: , Rfl:  .  lovastatin (MEVACOR) 40 MG tablet, Take 1 tablet (40 mg total) by mouth at bedtime., Disp: 90 tablet, Rfl: 3 .  metFORMIN (GLUCOPHAGE) 1000 MG tablet, Take 1 tablet (1,000 mg total) by mouth 2 (two) times daily with a meal., Disp: 180 tablet, Rfl: 3 .  Multiple Vitamins-Minerals (CENTRUM  SILVER PO), Take 1 tablet by mouth daily., Disp: , Rfl:  .  OLANZapine (ZYPREXA) 5 MG tablet, TK 1 T PO D, Disp: , Rfl: 1 .  propranolol (INDERAL) 10 MG tablet, Take 1 tablet (10 mg total) by mouth 2 (two) times daily., Disp: 180 tablet, Rfl: 3 .  Alcohol Swabs (ALCOHOL PREP) PADS, Use 1 pad prior to checking blood sugar to clean skin (Patient not taking: Reported on 12/23/2017), Disp: 100 each, Rfl: 3 .  Blood Glucose Monitoring Suppl (FREESTYLE LITE) DEVI, Check fasting blood sugar q a.m. (Patient not taking: Reported on 12/23/2017), Disp: 1 each, Rfl: 0 .  Glucosamine-Chondroitin (MOVE FREE PO), Take 1 tablet by mouth daily., Disp: , Rfl:  .  glucose blood (FREESTYLE LITE) test strip, Check fasting blood sugar q a.m. (Patient not taking: Reported on 12/23/2017), Disp: 100 each, Rfl: 12 .  Lancets (FREESTYLE) lancets, Check fasting blood glucose q a.m (Patient not taking: Reported on 12/23/2017), Disp: 100 each, Rfl: 12  Review of Systems  Constitutional: Negative for chills.  Respiratory: Negative for cough, chest tightness and shortness of breath.   Cardiovascular: Negative for chest pain, palpitations and leg swelling.  Gastrointestinal: Positive for nausea ("last night"). Negative for abdominal pain and vomiting.  Endocrine: Positive for polyuria.  Genitourinary: Positive for dysuria and frequency. Negative for hematuria, urgency, vaginal bleeding, vaginal discharge and vaginal pain.  Musculoskeletal: Negative for back  pain.    Social History   Tobacco Use  . Smoking status: Never Smoker  . Smokeless tobacco: Never Used  Substance Use Topics  . Alcohol use: No    Alcohol/week: 0.0 standard drinks    Comment: Quit in 04/24/1993   Objective:   BP (!) 141/79 (BP Location: Left Arm, Patient Position: Sitting, Cuff Size: Large)   Pulse 73   Temp 98.2 F (36.8 C) (Oral)   Resp 16   Wt 196 lb 1.6 oz (89 kg)   BMI 32.63 kg/m  Vitals:   12/23/17 1753  BP: (!) 141/79  Pulse: 73    Resp: 16  Temp: 98.2 F (36.8 C)  TempSrc: Oral  Weight: 196 lb 1.6 oz (89 kg)     Physical Exam  Constitutional: She is oriented to person, place, and time. She appears well-developed and well-nourished. No distress.  Cardiovascular: Normal rate, regular rhythm and normal heart sounds. Exam reveals no gallop and no friction rub.  No murmur heard. Pulmonary/Chest: Effort normal and breath sounds normal. No respiratory distress. She has no wheezes. She has no rales.  Abdominal: Soft. Normal appearance and bowel sounds are normal. She exhibits no distension and no mass. There is no hepatosplenomegaly. There is no tenderness. There is no rebound, no guarding and no CVA tenderness.  Neurological: She is alert and oriented to person, place, and time.  Skin: Skin is warm and dry. She is not diaphoretic.  Vitals reviewed.       Assessment & Plan:     1. Burning with urination Worsening symptoms. UA positive. Will treat empirically with Macrobid. Continue to push fluids. Urine sent for culture. Will follow up pending C&S results. She is to call if symptoms do not improve or if they worsen.  - POCT urinalysis dipstick - Urine Culture  2. Acute cystitis without hematuria See above medical treatment plan. - nitrofurantoin, macrocrystal-monohydrate, (MACROBID) 100 MG capsule; Take 1 capsule (100 mg total) by mouth 2 (two) times daily.  Dispense: 10 capsule; Refill: 0       Mar Daring, PA-C  [facility]

## 2017-12-26 LAB — URINE CULTURE

## 2017-12-28 ENCOUNTER — Telehealth: Payer: Self-pay | Admitting: *Deleted

## 2017-12-28 NOTE — Telephone Encounter (Signed)
LMOVM for pt to return call 

## 2017-12-28 NOTE — Telephone Encounter (Signed)
-----   Message from Mar Daring, PA-C sent at 12/28/2017  8:12 AM EST ----- Urine culture is negative. Are symptoms improving with the antibiotic?

## 2017-12-28 NOTE — Telephone Encounter (Signed)
Viewed by [name] on 12/28/2017 8:18 AM

## 2018-03-02 DIAGNOSIS — F410 Panic disorder [episodic paroxysmal anxiety] without agoraphobia: Secondary | ICD-10-CM | POA: Diagnosis not present

## 2018-03-02 DIAGNOSIS — Z79899 Other long term (current) drug therapy: Secondary | ICD-10-CM | POA: Diagnosis not present

## 2018-03-15 ENCOUNTER — Ambulatory Visit (INDEPENDENT_AMBULATORY_CARE_PROVIDER_SITE_OTHER): Payer: Medicare Other | Admitting: Physician Assistant

## 2018-03-15 ENCOUNTER — Encounter: Payer: Self-pay | Admitting: Physician Assistant

## 2018-03-15 VITALS — BP 128/80 | HR 75 | Temp 97.9°F | Resp 16 | Wt 191.2 lb

## 2018-03-15 DIAGNOSIS — F339 Major depressive disorder, recurrent, unspecified: Secondary | ICD-10-CM | POA: Diagnosis not present

## 2018-03-15 DIAGNOSIS — E119 Type 2 diabetes mellitus without complications: Secondary | ICD-10-CM

## 2018-03-15 DIAGNOSIS — F411 Generalized anxiety disorder: Secondary | ICD-10-CM | POA: Diagnosis not present

## 2018-03-15 DIAGNOSIS — R41840 Attention and concentration deficit: Secondary | ICD-10-CM | POA: Diagnosis not present

## 2018-03-15 NOTE — Progress Notes (Signed)
Patient: Melissa Arroyo Female    DOB: [birth year]   68 y.o.   MRN: [id number] Visit Date: 03/15/2018  Today's Provider: Mar Daring, PA-C   Chief Complaint  Patient presents with  . ADHD  . Diabetes  . Anxiety   Subjective:     HPI  Patient here today with c/o wanting to be tested for ADHD.she reports that she is having trouble concentrating at work.  Anxiety: Patient complains of anxiety disorder.  She has the following symptoms: feelings of losing control, not concentrating/remembering. She denies current suicidal and homicidal ideation. Reports that she needs to check thing 2-3 times to make sure she is not putting the wrong things. Reports that her manager screams at her, wants her to speed up. Reports that ever since she started her job she has felt that her anxiety is higher but is better since she started her job in December. Reports that she spoke to her therapist and she advised her to get tested for ADD. Reports that her psychiatrist got his license revoked, Dr. Rosine Door so she will need to find another psychiatrist for her care.    Diabetes Mellitus Type II, Follow-up:   Lab Results  Component Value Date   HGBA1C 7.2 (A) 11/05/2017   HGBA1C 7.6 (A) 08/05/2017   HGBA1C 6.7 (H) 03/30/2017    Last seen for diabetes 4 months ago.  Management since then includes continue current medications. She reports excellent compliance with treatment. She is not having side effects.  Current symptoms include none and have been stable. Home blood sugar records: not checking   Current Insulin Regimen:none Most Recent Eye Exam: 08/2017 Weight trend: decreasing steadily Current diet: in general, a "healthy" diet   Current exercise: walking  Pertinent Labs:    Component Value Date/Time   CHOL 216 (H) 03/30/2017 1010   TRIG 121 03/30/2017 1010   HDL 93 03/30/2017 1010   LDLCALC 99 03/30/2017 1010   LDLCALC 105 (H) 11/21/2016 1202   CREATININE 0.81 10/19/2017 0950    CREATININE 0.76 11/21/2016 1202    Wt Readings from Last 3 Encounters:  03/15/18 191 lb 3.2 oz (86.7 kg)  12/23/17 196 lb 1.6 oz (89 kg)  11/05/17 193 lb 9.6 oz (87.8 kg)   ------------------------------------------------------------------------   Allergies  Allergen Reactions  . Penicillins Rash  . Vesicare [Solifenacin] Anaphylaxis    Throat Closed     Current Outpatient Medications:  .  Brexpiprazole (REXULTI) 2 MG TABS, Take 1 tablet by mouth daily., Disp: , Rfl:  .  clonazePAM (KLONOPIN) 1 MG tablet, TAKE ONE TABLET BY MOUTH AT BEDTIME.  MAY TAKE ONE-HALF TABLET EVERY 8 HOURS DURING THE DAY FOR PANIC ATTACKS IF NEEDED, Disp: 180 tablet, Rfl: 1 .  estradiol (ESTRACE) 0.1 MG/GM vaginal cream, Place 1 Applicatorful vaginally at bedtime., Disp: 42.5 g, Rfl: 12 .  FLUoxetine (PROZAC) 40 MG capsule, TK 1 C PO D, Disp: , Rfl: 0 .  glipiZIDE (GLUCOTROL XL) 10 MG 24 hr tablet, Take 1 tablet (10 mg total) by mouth daily with breakfast., Disp: 90 tablet, Rfl: 3 .  lovastatin (MEVACOR) 40 MG tablet, Take 1 tablet (40 mg total) by mouth at bedtime., Disp: 90 tablet, Rfl: 3 .  metFORMIN (GLUCOPHAGE) 1000 MG tablet, Take 1 tablet (1,000 mg total) by mouth 2 (two) times daily with a meal., Disp: 180 tablet, Rfl: 3 .  Multiple Vitamins-Minerals (CENTRUM SILVER PO), Take 1 tablet by mouth daily., Disp: , Rfl:  .  OLANZapine (ZYPREXA) 5 MG tablet, TK 1 T PO D, Disp: , Rfl: 1 .  propranolol (INDERAL) 10 MG tablet, Take 1 tablet (10 mg total) by mouth 2 (two) times daily., Disp: 180 tablet, Rfl: 3 .  Alcohol Swabs (ALCOHOL PREP) PADS, Use 1 pad prior to checking blood sugar to clean skin (Patient not taking: Reported on 12/23/2017), Disp: 100 each, Rfl: 3 .  Blood Glucose Monitoring Suppl (FREESTYLE LITE) DEVI, Check fasting blood sugar q a.m. (Patient not taking: Reported on 12/23/2017), Disp: 1 each, Rfl: 0 .  Glucosamine-Chondroitin (MOVE FREE PO), Take 1 tablet by mouth daily., Disp: , Rfl:    .  glucose blood (FREESTYLE LITE) test strip, Check fasting blood sugar q a.m. (Patient not taking: Reported on 12/23/2017), Disp: 100 each, Rfl: 12 .  ibuprofen (ADVIL,MOTRIN) 200 MG tablet, Take 400 mg by mouth at bedtime., Disp: , Rfl:  .  Lancets (FREESTYLE) lancets, Check fasting blood glucose q a.m (Patient not taking: Reported on 12/23/2017), Disp: 100 each, Rfl: 12 .  nitrofurantoin, macrocrystal-monohydrate, (MACROBID) 100 MG capsule, Take 1 capsule (100 mg total) by mouth 2 (two) times daily. (Patient not taking: Reported on 03/15/2018), Disp: 10 capsule, Rfl: 0  Review of Systems  Constitutional: Negative.   Respiratory: Negative.   Cardiovascular: Negative.   Gastrointestinal: Negative.   Psychiatric/Behavioral: Positive for agitation, decreased concentration and dysphoric mood. Negative for self-injury, sleep disturbance and suicidal ideas. The patient is nervous/anxious.     Social History   Tobacco Use  . Smoking status: Never Smoker  . Smokeless tobacco: Never Used  Substance Use Topics  . Alcohol use: No    Alcohol/week: 0.0 standard drinks    Comment: Quit in 04/24/1993      Objective:   BP 128/80 (BP Location: Left Arm, Patient Position: Sitting, Cuff Size: Large)   Pulse 75   Temp 97.9 F (36.6 C) (Oral)   Resp 16   Wt 191 lb 3.2 oz (86.7 kg)   BMI 31.82 kg/m  Vitals:   03/15/18 1535  BP: 128/80  Pulse: 75  Resp: 16  Temp: 97.9 F (36.6 C)  TempSrc: Oral  Weight: 191 lb 3.2 oz (86.7 kg)     Physical Exam Vitals signs reviewed.  Constitutional:      General: She is not in acute distress.    Appearance: She is well-developed. She is not diaphoretic.  Neck:     Musculoskeletal: Normal range of motion and neck supple.     Thyroid: No thyromegaly.     Vascular: No JVD.     Trachea: No tracheal deviation.  Cardiovascular:     Rate and Rhythm: Normal rate and regular rhythm.     Heart sounds: Normal heart sounds. No murmur. No friction rub. No  gallop.   Pulmonary:     Effort: Pulmonary effort is normal. No respiratory distress.     Breath sounds: Normal breath sounds. No wheezing or rales.  Lymphadenopathy:     Cervical: No cervical adenopathy.  Psychiatric:        Attention and Perception: Attention normal.        Mood and Affect: Mood is anxious and depressed.        Speech: Speech normal.        Behavior: Behavior normal.        Thought Content: Thought content normal. Thought content does not include homicidal or suicidal ideation.        Cognition and Memory: Cognition normal.  Judgment: Judgment normal.     Adult ADHD Self-Report Scale (ASRS-v1.1) Symptom Checklist  Part A 1. How often do you have trouble wrapping up the final details of a project, once the challenging parts have been done?: (!) Often 2. How often do you have difficulty getting things done in order when you have to do a task that requires organization?: (!) Often 3. How often do you have problems remembering appointments or obligations?: Never 4. When you have a task that requires a lot of thought, how often do you avoid or delay getting started?: (!) Very Often 5. How often do you fidget or squirm with your hands or feet when you have to sit down for a long time?: Never 6. How often do you feel overly active and compelled to do things, like you were driven by a motor?: Rarely  Part B 7. How often do you make careless mistakes when you have to work on a boring or difficult project?: (!) Often 8. How often do you have difficulty keeping your attention when you are doing boring or repetitive work?: (!) Very Often 9. How often do you have difficulty concentrating on what people say to you, even when they are speaking to you directly?: (!) Sometimes 10. How often do you misplace or have difficulty finding things at home or at work?: Rarely 11. How often are you distracted by activity or noise around you?: (!) Very Often 12. How often do you leave  your seat in meetings or other situations in which you are expected to remain seated?: Rarely 13. How often do you feel restless or fidgety?: Never 14. How often do you have difficulty unwinding and relaxing when you have time to yourself?: Never 15. How often do you find yourself talking too much when you are in social situations?: Rarely 16. When you are in a conversation, how often do you find yourself finishing the sentences of the people you are talking to, before they can finish them themselves?: Rarely 17. How often do you have difficulty waiting your turn in situations when turn taking is required?: Never 18. How often do you interrupt others when they are busy?: (!) Often How old were you when these problems first began to occur?: 21     Assessment & Plan    1. Type 2 diabetes mellitus without complication, without long-term current use of insulin (HCC) A1c in office today unable to read. Will order lab as below and f/u pending results.  - HgB A1c  2. Difficulty concentrating Patient desires ADHD testing due to decreased concentration at work. I do not feel it is ADHD, I feel it is worsening depression and anxiety possibly due to some PTSD from a previous aggressive boss, and having a similar situation. However, referral was still placed as below. Also new referral for a psychiatrist as been placed as well. Patient's current psychiatrist is losing his medical license and unable to practice at this time.  - Ambulatory referral to Psychology - Ambulatory referral to Psychiatry  3. Depression, recurrent ([place]) See above medical treatment plan. - Ambulatory referral to Psychiatry  4. GAD (generalized anxiety disorder) See above medical treatment plan. - Ambulatory referral to Psychiatry     Mar Daring, PA-C  [facility]

## 2018-03-16 LAB — HEMOGLOBIN A1C
ESTIMATED AVERAGE GLUCOSE: 160 mg/dL
HEMOGLOBIN A1C: 7.2 % — AB (ref 4.8–5.6)

## 2018-03-17 ENCOUNTER — Telehealth: Payer: Self-pay

## 2018-03-17 NOTE — Telephone Encounter (Signed)
-----   Message from Mar Daring, [place] sent at 03/17/2018 11:15 AM EST ----- A1c is stable at 7.2. Continue current medications.

## 2018-03-17 NOTE — Telephone Encounter (Signed)
LMTCB

## 2018-03-18 NOTE — Telephone Encounter (Signed)
Pt returned call and was advised of lab results.  dbs

## 2018-03-21 ENCOUNTER — Encounter: Payer: Self-pay | Admitting: Physician Assistant

## 2018-04-15 DIAGNOSIS — F3176 Bipolar disorder, in full remission, most recent episode depressed: Secondary | ICD-10-CM | POA: Diagnosis not present

## 2018-04-28 ENCOUNTER — Telehealth: Payer: Self-pay | Admitting: Physician Assistant

## 2018-04-28 DIAGNOSIS — F900 Attention-deficit hyperactivity disorder, predominantly inattentive type: Secondary | ICD-10-CM | POA: Diagnosis not present

## 2018-04-28 DIAGNOSIS — T85848D Pain due to other internal prosthetic devices, implants and grafts, subsequent encounter: Secondary | ICD-10-CM

## 2018-04-28 DIAGNOSIS — F4011 Social phobia, generalized: Secondary | ICD-10-CM | POA: Diagnosis not present

## 2018-04-28 DIAGNOSIS — F3341 Major depressive disorder, recurrent, in partial remission: Secondary | ICD-10-CM | POA: Diagnosis not present

## 2018-04-28 DIAGNOSIS — F410 Panic disorder [episodic paroxysmal anxiety] without agoraphobia: Secondary | ICD-10-CM | POA: Diagnosis not present

## 2018-04-28 MED ORDER — HYDROCODONE-ACETAMINOPHEN 7.5-325 MG PO TABS: 1.0000 | ORAL_TABLET | Freq: Three times a day (TID) | ORAL | 0 refills | Status: DC | PRN

## 2018-04-28 NOTE — Telephone Encounter (Signed)
Patient advised as below.  

## 2018-04-28 NOTE — Telephone Encounter (Signed)
Sent in 3 days worth. At that time she needs to transition to ibu 600mg  TID or aleve 440mg  BID.

## 2018-04-28 NOTE — Telephone Encounter (Signed)
Pt has pain from the dental implant that her dentist put in. She was given 6 Hydrocodone 7.5 / 325 T. Pt is still experiencing pain and the dental office has closed.    Wanting to know if Tawanna Sat can prescribe her the Hydrocodone 7.5 / 325 T?  Please advise.  Thanks, [employer]

## 2018-06-10 DIAGNOSIS — F410 Panic disorder [episodic paroxysmal anxiety] without agoraphobia: Secondary | ICD-10-CM | POA: Diagnosis not present

## 2018-06-10 DIAGNOSIS — F3341 Major depressive disorder, recurrent, in partial remission: Secondary | ICD-10-CM | POA: Diagnosis not present

## 2018-06-10 DIAGNOSIS — F900 Attention-deficit hyperactivity disorder, predominantly inattentive type: Secondary | ICD-10-CM | POA: Diagnosis not present

## 2018-06-10 DIAGNOSIS — F4011 Social phobia, generalized: Secondary | ICD-10-CM | POA: Diagnosis not present

## 2018-06-13 ENCOUNTER — Other Ambulatory Visit: Payer: Self-pay | Admitting: Physician Assistant

## 2018-06-13 DIAGNOSIS — E7800 Pure hypercholesterolemia, unspecified: Secondary | ICD-10-CM

## 2018-06-13 DIAGNOSIS — I10 Essential (primary) hypertension: Secondary | ICD-10-CM

## 2018-06-24 DIAGNOSIS — F410 Panic disorder [episodic paroxysmal anxiety] without agoraphobia: Secondary | ICD-10-CM | POA: Diagnosis not present

## 2018-06-24 DIAGNOSIS — F4011 Social phobia, generalized: Secondary | ICD-10-CM | POA: Diagnosis not present

## 2018-06-24 DIAGNOSIS — F900 Attention-deficit hyperactivity disorder, predominantly inattentive type: Secondary | ICD-10-CM | POA: Diagnosis not present

## 2018-06-24 DIAGNOSIS — F3341 Major depressive disorder, recurrent, in partial remission: Secondary | ICD-10-CM | POA: Diagnosis not present

## 2018-07-15 DIAGNOSIS — F900 Attention-deficit hyperactivity disorder, predominantly inattentive type: Secondary | ICD-10-CM | POA: Diagnosis not present

## 2018-07-15 DIAGNOSIS — F3341 Major depressive disorder, recurrent, in partial remission: Secondary | ICD-10-CM | POA: Diagnosis not present

## 2018-07-15 DIAGNOSIS — F4011 Social phobia, generalized: Secondary | ICD-10-CM | POA: Diagnosis not present

## 2018-07-15 DIAGNOSIS — F410 Panic disorder [episodic paroxysmal anxiety] without agoraphobia: Secondary | ICD-10-CM | POA: Diagnosis not present

## 2018-07-29 DIAGNOSIS — F900 Attention-deficit hyperactivity disorder, predominantly inattentive type: Secondary | ICD-10-CM | POA: Diagnosis not present

## 2018-07-29 DIAGNOSIS — F3341 Major depressive disorder, recurrent, in partial remission: Secondary | ICD-10-CM | POA: Diagnosis not present

## 2018-07-29 DIAGNOSIS — F410 Panic disorder [episodic paroxysmal anxiety] without agoraphobia: Secondary | ICD-10-CM | POA: Diagnosis not present

## 2018-07-29 DIAGNOSIS — F4011 Social phobia, generalized: Secondary | ICD-10-CM | POA: Diagnosis not present

## 2018-07-31 ENCOUNTER — Emergency Department
Admission: EM | Admit: 2018-07-31 | Discharge: 2018-07-31 | Disposition: A | Discharge status: Home / Self Care | Payer: Medicare Other | Attending: Emergency Medicine | Admitting: Emergency Medicine

## 2018-07-31 ENCOUNTER — Other Ambulatory Visit: Payer: Self-pay

## 2018-07-31 ENCOUNTER — Emergency Department: Payer: Medicare Other

## 2018-07-31 DIAGNOSIS — R05 Cough: Secondary | ICD-10-CM | POA: Diagnosis not present

## 2018-07-31 DIAGNOSIS — U071 COVID-19: Secondary | ICD-10-CM | POA: Diagnosis not present

## 2018-07-31 DIAGNOSIS — R0789 Other chest pain: Secondary | ICD-10-CM | POA: Diagnosis not present

## 2018-07-31 DIAGNOSIS — Z79899 Other long term (current) drug therapy: Secondary | ICD-10-CM | POA: Diagnosis not present

## 2018-07-31 DIAGNOSIS — R0600 Dyspnea, unspecified: Secondary | ICD-10-CM | POA: Diagnosis not present

## 2018-07-31 DIAGNOSIS — E119 Type 2 diabetes mellitus without complications: Secondary | ICD-10-CM | POA: Diagnosis not present

## 2018-07-31 DIAGNOSIS — Z7984 Long term (current) use of oral hypoglycemic drugs: Secondary | ICD-10-CM | POA: Insufficient documentation

## 2018-07-31 DIAGNOSIS — R0602 Shortness of breath: Secondary | ICD-10-CM | POA: Diagnosis not present

## 2018-07-31 DIAGNOSIS — I10 Essential (primary) hypertension: Secondary | ICD-10-CM | POA: Insufficient documentation

## 2018-07-31 LAB — SARS CORONAVIRUS 2 BY RT PCR (HOSPITAL ORDER, PERFORMED IN ~~LOC~~ HOSPITAL LAB): SARS Coronavirus 2: POSITIVE — AB

## 2018-07-31 LAB — BRAIN NATRIURETIC PEPTIDE: B Natriuretic Peptide: 70 pg/mL (ref 0.0–100.0)

## 2018-07-31 LAB — COMPREHENSIVE METABOLIC PANEL
ALT: 39 U/L (ref 0–44)
AST: 40 U/L (ref 15–41)
Albumin: 4.1 g/dL (ref 3.5–5.0)
Alkaline Phosphatase: 85 U/L (ref 38–126)
Anion gap: 10 (ref 5–15)
BUN: 24 mg/dL — ABNORMAL HIGH (ref 8–23)
CO2: 25 mmol/L (ref 22–32)
Calcium: 9.4 mg/dL (ref 8.9–10.3)
Chloride: 106 mmol/L (ref 98–111)
Creatinine, Ser: 0.74 mg/dL (ref 0.44–1.00)
GFR calc Af Amer: >60 mL/min (ref >60)
GFR calc non Af Amer: >60 mL/min (ref >60)
Glucose, Bld: 86 mg/dL (ref 70–99)
Potassium: 4.4 mmol/L (ref 3.5–5.1)
Sodium: 141 mmol/L (ref 135–145)
Total Bilirubin: 0.5 mg/dL (ref 0.3–1.2)
Total Protein: 7.4 g/dL (ref 6.5–8.1)

## 2018-07-31 LAB — CBC WITH DIFFERENTIAL/PLATELET
Abs Immature Granulocytes: 0.04 10*3/uL (ref 0.00–0.07)
Basophils Absolute: 0 10*3/uL (ref 0.0–0.1)
Basophils Relative: 0 %
Eosinophils Absolute: 0.3 10*3/uL (ref 0.0–0.5)
Eosinophils Relative: 3 %
HCT: 40.3 % (ref 36.0–46.0)
Hemoglobin: 13.2 g/dL (ref 12.0–15.0)
Immature Granulocytes: 0 %
Lymphocytes Relative: 20 %
Lymphs Abs: 1.8 10*3/uL (ref 0.7–4.0)
MCH: 31.1 pg (ref 26.0–34.0)
MCHC: 32.8 g/dL (ref 30.0–36.0)
MCV: 94.8 fL (ref 80.0–100.0)
Monocytes Absolute: 0.7 10*3/uL (ref 0.1–1.0)
Monocytes Relative: 8 %
Neutro Abs: 6.1 10*3/uL (ref 1.7–7.7)
Neutrophils Relative %: 69 %
Platelets: 263 10*3/uL (ref 150–400)
RBC: 4.25 MIL/uL (ref 3.87–5.11)
RDW: 13.3 % (ref 11.5–15.5)
WBC: 8.9 10*3/uL (ref 4.0–10.5)
nRBC: 0 % (ref 0.0–0.2)

## 2018-07-31 LAB — TROPONIN I: Troponin I: <0.03 ng/mL (ref <0.03)

## 2018-07-31 NOTE — Discharge Instructions (Addendum)
You are positive for the coronavirus.  Please quarantine yourself for 2 weeks.  Please return here if you get increasing shortness of breath or feel sicker or have any other problems.  You need to stay quarantine until you have been symptom-free for at least a week.  That includes no coughing or fever.  It does not include the shortness of breath that this may persist for months.

## 2018-07-31 NOTE — ED Notes (Signed)
Pt states she had a heart attack in 1996.

## 2018-07-31 NOTE — ED Triage Notes (Signed)
Pt arrived via POV with reports of shortness of breath and cough that is worse today. 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  Pt states she knows someone that has been positive but is not sure who it was at work.   Pt reports very minimal sputum.   Denies any fevers, loss of taste or smell.

## 2018-07-31 NOTE — ED Provider Notes (Signed)
[facility] Emergency Department Provider Note   ____________________________________________   None    (approximate)  I have reviewed the triage vital signs and the nursing notes.   HISTORY  Chief Complaint Shortness of Breath and Cough    HPI Melissa Arroyo is a 68 y.o. female patient reports shortness of breath with exertion.  Is been going on for some time but getting worse.  She is not running a fever but now she is coughing.  The cough is nonproductive.  She had some chest tightness last week 2.  She is not having any now.         Past Medical History:  Diagnosis Date   Anxiety    Depression    Diabetes mellitus ([place])    Heart attack ([place]) 04/2007   Heart attack ([place])    Hyperlipidemia    Hypertension    Obesity    Recurrent UTI    Renal lesion    Urinary incontinence in female    Vaginal atrophy     Patient Active Problem List   Diagnosis Date Noted   Depression, recurrent ([place]) 05/26/2016   Benign essential HTN 02/14/2015   Dysuria 12/24/2014   Recurrent UTI 09/28/2014   Dyspareunia in female 09/28/2014   Atrophic vaginitis 09/28/2014   Urge incontinence 09/28/2014   Renal cyst, left 07/24/2014   Microscopic hematuria 07/24/2014   Headache, tension-type 07/24/2014   Combined fat and carbohydrate induced hyperlipemia 07/24/2014   Diabetes mellitus, type 2 ([place]) 07/24/2014    Past Surgical History:  Procedure Laterality Date   BUNIONECTOMY  2001   CARDIAC CATHETERIZATION     NECK SURGERY     ROTATOR CUFF REPAIR Left     Prior to Admission medications   Medication Sig Start Date End Date Taking? Authorizing Provider  Alcohol Swabs (ALCOHOL PREP) PADS Use 1 pad prior to checking blood sugar to clean skin Patient not taking: Reported on 12/23/2017 09/27/15   Mar Daring, PA-C  Blood Glucose Monitoring Suppl (FREESTYLE LITE) DEVI Check fasting blood sugar q a.m. Patient not taking:  Reported on 12/23/2017 02/25/17   Mar Daring, PA-C  Brexpiprazole (REXULTI) 2 MG TABS Take 1 tablet by mouth daily.    [provider]  clonazePAM (KLONOPIN) 1 MG tablet TAKE ONE TABLET BY MOUTH AT BEDTIME.  MAY TAKE ONE-HALF TABLET EVERY 8 HOURS DURING THE DAY FOR PANIC ATTACKS IF NEEDED 04/23/16   Mar Daring, PA-C  estradiol (ESTRACE) 0.1 MG/GM vaginal cream Place 1 Applicatorful vaginally at bedtime. 09/30/16   Mar Daring, PA-C  FLUoxetine (PROZAC) 40 MG capsule TK 1 C PO D 07/30/17   [provider]  glipiZIDE (GLUCOTROL XL) 10 MG 24 hr tablet Take 1 tablet (10 mg total) by mouth daily with breakfast. 08/24/17   Mar Daring, PA-C  Glucosamine-Chondroitin (MOVE FREE PO) Take 1 tablet by mouth daily.    [provider]  glucose blood (FREESTYLE LITE) test strip Check fasting blood sugar q a.m. Patient not taking: Reported on 12/23/2017 11/23/15   Mar Daring, PA-C  HYDROcodone-acetaminophen [facility]) 7.5-325 MG tablet Take 1 tablet by mouth every 8 (eight) hours as needed for moderate pain. 04/28/18   Mar Daring, PA-C  ibuprofen (ADVIL,MOTRIN) 200 MG tablet Take 400 mg by mouth at bedtime.    [provider]  Lancets (FREESTYLE) lancets Check fasting blood glucose q a.m Patient not taking: Reported on 12/23/2017 11/23/15   Mar Daring,  PA-C  lovastatin (MEVACOR) 40 MG tablet TAKE 1 TABLET(40 MG) BY MOUTH AT BEDTIME 06/14/18   Fenton Malling M, PA-C  metFORMIN (GLUCOPHAGE) 1000 MG tablet Take 1 tablet (1,000 mg total) by mouth 2 (two) times daily with a meal. 08/05/17   Burnette, Clearnce Sorrel, PA-C  Multiple Vitamins-Minerals (CENTRUM SILVER PO) Take 1 tablet by mouth daily.    [provider]  OLANZapine (ZYPREXA) 5 MG tablet TK 1 T PO D 07/31/17   [provider]  propranolol (INDERAL) 10 MG tablet TAKE 1 TABLET(10 MG) BY MOUTH TWICE DAILY 06/14/18   Mar Daring, PA-C     Allergies Penicillins and Vesicare [solifenacin]  Family History  Problem Relation Age of Onset   Hypertension Mother    Cancer Mother        Luekemia   Diabetes Mother    Cancer Other        Bladder,Kidney,Prostate   Uterine cancer Maternal Grandmother    Breast cancer Neg Hx     Social History Social History   Tobacco Use   Smoking status: Never Smoker   Smokeless tobacco: Never Used  Substance Use Topics   Alcohol use: No    Alcohol/week: 0.0 standard drinks    Comment: Quit in 04/24/1993   Drug use: No    Review of Systems  Constitutional: No fever/chills Eyes: No visual changes. ENT: No sore throat. Cardiovascular: Denies chest pain. Respiratory: Denies shortness of breath. Gastrointestinal: No abdominal pain.  No nausea, no vomiting.  No diarrhea.  No constipation. Genitourinary: Negative for dysuria. Musculoskeletal: Negative for back pain. Skin: Negative for rash. Neurological: Negative for headaches, focal weakness  ____________________________________________   PHYSICAL EXAM:  VITAL SIGNS: ED Triage Vitals  Enc Vitals Group     BP 07/31/18 1244 (!) 141/48     Pulse Rate 07/31/18 1244 (!) 18     Resp 07/31/18 1244 18     Temp 07/31/18 1244 98.6 F (37 C)     Temp Source 07/31/18 1244 Oral     SpO2 07/31/18 1244 96 %     Weight 07/31/18 1244 200 lb (90.7 kg)     Height 07/31/18 1244 5\' 5"  (1.651 m)     Head Circumference --      Peak Flow --      Pain Score 07/31/18 1259 0     Pain Loc --      Pain Edu? --      Excl. in [place]? --     Constitutional: Alert and oriented. Well appearing and in no acute distress. Eyes: Conjunctivae are normal.  Head: Atraumatic. Nose: No congestion/rhinnorhea. Mouth/Throat: Mucous membranes are moist.  Oropharynx non-erythematous. Neck: No stridor.   Cardiovascular: Normal rate, regular rhythm. Grossly normal heart sounds.  Good peripheral circulation. Respiratory: Normal respiratory effort.  No  retractions. Lungs CTAB. Gastrointestinal: Soft and nontender. No distention. No abdominal bruits. No CVA tenderness. Musculoskeletal: No pain in the extremities.  No edema Neurologic:  Normal speech and language. No gross focal neurologic deficits are appreciated.  Skin:  Skin is warm, dry and intact. No rash noted.  ____________________________________________   LABS (all labs ordered are listed, but only abnormal results are displayed)  Labs Reviewed  SARS CORONAVIRUS 2 (HOSPITAL ORDER, [place] [facility]) - Abnormal; Notable for the following components:      Result Value   SARS Coronavirus 2 POSITIVE (*)    All other components within normal limits  COMPREHENSIVE METABOLIC PANEL -  Abnormal; Notable for the following components:   BUN 24 (*)    All other components within normal limits  BRAIN NATRIURETIC PEPTIDE  TROPONIN I  CBC WITH DIFFERENTIAL/PLATELET  FIBRIN DERIVATIVES D-DIMER ([facility] ONLY)   ____________________________________________  EKG  EKG read and interpreted by me shows sinus rhythm with PVCs in fact bigeminy right this minute rate of 76 there are flipped T's in 2 3 V4 and V5 which may or may not be new. ____________________________________________  RADIOLOGY  ED MD interpretation:    Official radiology report(s): Dg Chest Portable 1 View  Result Date: 07/31/2018 CLINICAL DATA:  Acute shortness of breath and cough EXAM: PORTABLE CHEST 1 VIEW COMPARISON:  05/06/2007 and prior radiographs FINDINGS: The cardiomediastinal silhouette is unremarkable. Mild elevation of the RIGHT hemidiaphragm again noted. There is no evidence of focal airspace disease, pulmonary edema, suspicious pulmonary nodule/mass, pleural effusion, or pneumothorax. No acute bony abnormalities are identified. Cervical fusion hardware again noted. IMPRESSION: No active disease. Electronically Signed   By: Margarette Canada M.D.   On: 07/31/2018 14:12     ____________________________________________   PROCEDURES  Procedure(s) performed (including Critical Care):  Procedures   ____________________________________________   INITIAL IMPRESSION / ASSESSMENT AND PLAN / ED COURSE  Melissa Arroyo was evaluated in Emergency Department on 07/31/2018 for the symptoms described in the history of present illness. She was evaluated in the context of the global COVID-19 pandemic, which necessitated consideration that the patient might be at risk for infection with the SARS-CoV-2 virus that causes COVID-19. Institutional protocols and algorithms that pertain to the evaluation of patients at risk for COVID-19 are in a state of rapid change based on information released by regulatory bodies including the CDC and federal and state organizations. These policies and algorithms were followed during the patient's care in the ED.     Patient is COVID positive.  I offered admission to her but she does not want to stay.  Her O2 sats with ambulation only went down from 99-96.  She does not have to stay there is no reason to commit her as she is fully capable of making her own decisions.  We will get her to quarantine herself return for any further problems and follow-up with her doctor as needed        ____________________________________________   FINAL CLINICAL IMPRESSION(S) / ED DIAGNOSES  Final diagnoses:  Dyspnea, unspecified type  Real time reverse transcriptase PCR positive for COVID-19 virus     ED Discharge Orders    None       Note:  This document was prepared using Dragon voice recognition software and may include unintentional dictation errors.    Nena Polio, MD 07/31/18 [phone]

## 2018-08-02 ENCOUNTER — Telehealth: Payer: Self-pay | Admitting: Physician Assistant

## 2018-08-02 DIAGNOSIS — U071 COVID-19: Secondary | ICD-10-CM

## 2018-08-02 MED ORDER — ALBUTEROL SULFATE HFA 108 (90 BASE) MCG/ACT IN AERS: 1.0000 | INHALATION_SPRAY | Freq: Four times a day (QID) | RESPIRATORY_TRACT | 0 refills | Status: DC | PRN

## 2018-08-02 NOTE — Telephone Encounter (Signed)
She can take Mucinex DM for cough and congestion. She can use tylenol for fevers and body aches. I can send albuterol inhaler for acute SOB.   She needs to have someone that is not in contact with her regularly go to the pharmacy to pick these up and drop them off at her house.

## 2018-08-02 NOTE — Telephone Encounter (Signed)
Pt went to [facility] - Sat - was tested for COVID - positive.  Wants to know if  Beynadryl or  Methylprednisolone would help her more?   Please call pt back at [phone].  Thanks, [employer]

## 2018-08-02 NOTE — Telephone Encounter (Signed)
Patient was advised. Expressed understanding.  

## 2018-08-05 ENCOUNTER — Telehealth: Payer: Self-pay | Admitting: Physician Assistant

## 2018-08-05 NOTE — Telephone Encounter (Signed)
Pt needing a call back to discuss her current symptoms.  She is confirmed with Covid.   Unbalanced when walking and body aches. Tylenol for pain - stopped taking because she was told it was not good for her.  Please advise.  Thanks, [employer]

## 2018-08-05 NOTE — Telephone Encounter (Signed)
Patient reports that her cough is better but she has started to feel more weak and  Unbalance. Reports that her husband told her not to take Tylenol because is not good. Advised patient that is the only pain medicine to take if she can't stand the body ache. Advised to drink plenty of fluids. Health Department is checking on patient everyday told patient to let the health department know of new symptoms and on how she is feeling. If worsening to go to the ED if not follow the health department guidelines. She also said she is due to go back to work on the 30th. Told patient that the health department will clear her when to go back to work.

## 2018-08-15 ENCOUNTER — Other Ambulatory Visit: Payer: Self-pay | Admitting: Physician Assistant

## 2018-08-15 DIAGNOSIS — E119 Type 2 diabetes mellitus without complications: Secondary | ICD-10-CM

## 2018-08-18 ENCOUNTER — Encounter: Payer: Self-pay | Admitting: Physician Assistant

## 2018-08-18 ENCOUNTER — Ambulatory Visit (INDEPENDENT_AMBULATORY_CARE_PROVIDER_SITE_OTHER): Payer: Medicare Other | Admitting: Physician Assistant

## 2018-08-18 ENCOUNTER — Telehealth: Payer: Self-pay

## 2018-08-18 VITALS — Wt 187.0 lb

## 2018-08-18 DIAGNOSIS — R197 Diarrhea, unspecified: Secondary | ICD-10-CM

## 2018-08-18 NOTE — Telephone Encounter (Signed)
Patient states she is returning call from this morning.  [phone]

## 2018-08-18 NOTE — Telephone Encounter (Signed)
I sent letter via mychart. She will have to give to supervisor. Can also print todays note and this letter and fax to [place].

## 2018-08-18 NOTE — Progress Notes (Signed)
Patient: Melissa Arroyo Female    DOB: 09/24/1950   68 y.o.   MRN: [id number] Visit Date: 08/18/2018  Today's Provider: Mar Daring, PA-C   Chief Complaint  Patient presents with  . Diarrhea   Subjective:    I,Melissa Arroyo,RMA am acting as a Education administrator for [employer], PA-C.  Virtual Visit via Video Note  I connected with Melissa Arroyo on 08/18/18 at  9:00 AM EDT by a video enabled telemedicine application and verified that I am speaking with the correct person using two identifiers.  Location: Patient: home Provider: [facility]   I discussed the limitations of evaluation and management by telemedicine and the availability of in person appointments. The patient expressed understanding and agreed to proceed.   HPI  Melissa Arroyo is a 68 yr old female that presents via Video visit today for complaint of diarrhea. She reports she has been having diarrhea over the last year. She reports she has been dealing with it but over the last 2 weeks it has worsened slightly. She has been eating a banana for the last 2 days and this has helped bulk up her stools.   She was recently diagnosed with Covid-19 on 07/31/18. She reports she has been doing ok, but does have some recurrence of symptoms this morning with some drainage, slightly sore throat and dry cough. All symptoms are mild at this time.   Allergies  Allergen Reactions  . Penicillins Rash  . Vesicare [Solifenacin] Anaphylaxis    Throat Closed     Current Outpatient Medications:  .  clonazePAM (KLONOPIN) 1 MG tablet, TAKE ONE TABLET BY MOUTH AT BEDTIME.  MAY TAKE ONE-HALF TABLET EVERY 8 HOURS DURING THE DAY FOR PANIC ATTACKS IF NEEDED, Disp: 180 tablet, Rfl: 1 .  estradiol (ESTRACE) 0.1 MG/GM vaginal cream, Place 1 Applicatorful vaginally at bedtime., Disp: 42.5 g, Rfl: 12 .  FLUoxetine (PROZAC) 40 MG capsule, TK 1 C PO D, Disp: , Rfl: 0 .  glipiZIDE (GLUCOTROL XL) 10 MG 24 hr tablet, Take 1 tablet (10 mg total)  by mouth daily with breakfast., Disp: 90 tablet, Rfl: 3 .  Glucosamine-Chondroitin (MOVE FREE PO), Take 1 tablet by mouth daily., Disp: , Rfl:  .  ibuprofen (ADVIL,MOTRIN) 200 MG tablet, Take 400 mg by mouth at bedtime., Disp: , Rfl:  .  lovastatin (MEVACOR) 40 MG tablet, TAKE 1 TABLET(40 MG) BY MOUTH AT BEDTIME, Disp: 90 tablet, Rfl: 3 .  metFORMIN (GLUCOPHAGE) 1000 MG tablet, TAKE 1 TABLET(1000 MG) BY MOUTH TWICE DAILY WITH A MEAL, Disp: 180 tablet, Rfl: 3 .  Multiple Vitamins-Minerals (CENTRUM SILVER PO), Take 1 tablet by mouth daily., Disp: , Rfl:  .  OLANZapine (ZYPREXA) 5 MG tablet, TK 1 T PO D, Disp: , Rfl: 1 .  propranolol (INDERAL) 10 MG tablet, TAKE 1 TABLET(10 MG) BY MOUTH TWICE DAILY, Disp: 180 tablet, Rfl: 3 .  albuterol (VENTOLIN HFA) 108 (90 Base) MCG/ACT inhaler, Inhale 1-2 puffs into the lungs every 6 (six) hours as needed for wheezing or shortness of breath. (Patient not taking: Reported on 08/18/2018), Disp: 18 g, Rfl: 0 .  Alcohol Swabs (ALCOHOL PREP) PADS, Use 1 pad prior to checking blood sugar to clean skin (Patient not taking: Reported on 12/23/2017), Disp: 100 each, Rfl: 3 .  Blood Glucose Monitoring Suppl (FREESTYLE LITE) DEVI, Check fasting blood sugar q a.m. (Patient not taking: Reported on 12/23/2017), Disp: 1 each, Rfl: 0 .  Brexpiprazole (REXULTI)  2 MG TABS, Take 1 tablet by mouth daily. , Disp: , Rfl:  .  glucose blood (FREESTYLE LITE) test strip, Check fasting blood sugar q a.m. (Patient not taking: Reported on 12/23/2017), Disp: 100 each, Rfl: 12 .  HYDROcodone-acetaminophen (NORCO) 7.5-325 MG tablet, Take 1 tablet by mouth every 8 (eight) hours as needed for moderate pain. (Patient not taking: Reported on 08/18/2018), Disp: 12 tablet, Rfl: 0 .  Lancets (FREESTYLE) lancets, Check fasting blood glucose q a.m (Patient not taking: Reported on 12/23/2017), Disp: 100 each, Rfl: 12  Review of Systems  Constitutional: Positive for fatigue. Negative for fever.  HENT: Positive  for congestion, postnasal drip and sore throat. Negative for rhinorrhea, sinus pressure, sinus pain, sneezing, tinnitus and trouble swallowing.   Respiratory: Positive for cough. Negative for chest tightness, shortness of breath and wheezing.   Cardiovascular: Negative for chest pain.  Gastrointestinal: Positive for diarrhea. Negative for abdominal pain, constipation, nausea and vomiting.  Neurological: Negative for dizziness, light-headedness and headaches.    Social History   Tobacco Use  . Smoking status: Never Smoker  . Smokeless tobacco: Never Used  Substance Use Topics  . Alcohol use: No    Alcohol/week: 0.0 standard drinks    Comment: Quit in 04/24/1993      Objective:   There were no vitals taken for this visit. There were no vitals filed for this visit.   Physical Exam Vitals signs reviewed.  Constitutional:      General: She is not in acute distress.    Appearance: Normal appearance. She is well-developed and normal weight. She is not ill-appearing.  HENT:     Head: Normocephalic and atraumatic.  Neck:     Musculoskeletal: Normal range of motion and neck supple.  Pulmonary:     Effort: Pulmonary effort is normal. No respiratory distress.  Neurological:     Mental Status: She is alert.  Psychiatric:        Mood and Affect: Mood normal.        Behavior: Behavior normal.        Thought Content: Thought content normal.        Judgment: Judgment normal.      No results found for any visits on 08/18/18.     Assessment & Plan     1. Diarrhea, unspecified type The diarrhea is probably a component of IBS-D and viral shedding of current Covid infection. Advised of dietary changes to help diarrhea (also sent on AVS). Discussed using probiotics and Imodium prn. Push fluids. Call if not improving and may consider IBS-D treatment options.    I discussed the assessment and treatment plan with the patient. The patient was provided an opportunity to ask questions and  all were answered. The patient agreed with the plan and demonstrated an understanding of the instructions.   The patient was advised to call back or seek an in-person evaluation if the symptoms worsen or if the condition fails to improve as anticipated.  I provided 16 minutes of non-face-to-face time during this encounter.     Mar Daring, PA-C  [facility]

## 2018-08-18 NOTE — Patient Instructions (Signed)
Food Choices to Help Relieve Diarrhea, Adult When you have diarrhea, the foods you eat and your eating habits are very important. Choosing the right foods and drinks can help:  Relieve diarrhea.  Replace lost fluids and nutrients.  Prevent dehydration. What general guidelines should I follow?  Relieving diarrhea  Choose foods with less than 2 g or .07 oz. of fiber per serving.  Limit fats to less than 8 tsp (38 g or 1.34 oz.) a day.  Avoid the following: ? Foods and beverages sweetened with high-fructose corn syrup, honey, or sugar alcohols such as xylitol, sorbitol, and mannitol. ? Foods that contain a lot of fat or sugar. ? Fried, greasy, or spicy foods. ? High-fiber grains, breads, and cereals. ? Raw fruits and vegetables.  Eat foods that are rich in probiotics. These foods include dairy products such as yogurt and fermented milk products. They help increase healthy bacteria in the stomach and intestines (gastrointestinal tract, or GI tract).  If you have lactose intolerance, avoid dairy products. These may make your diarrhea worse.  Take medicine to help stop diarrhea (antidiarrheal medicine) only as told by your health care provider. Replacing nutrients  Eat small meals or snacks every 3-4 hours.  Eat bland foods, such as white rice, toast, or baked potato, until your diarrhea starts to get better. Gradually reintroduce nutrient-rich foods as tolerated or as told by your health care provider. This includes: ? Well-cooked protein foods. ? Peeled, seeded, and soft-cooked fruits and vegetables. ? Low-fat dairy products.  Take vitamin and mineral supplements as told by your health care provider. Preventing dehydration  Start by sipping water or a special solution to prevent dehydration (oral rehydration solution, ORS). Urine that is clear or pale yellow means that you are getting enough fluid.  Try to drink at least 8-10 cups of fluid each day to help replace lost  fluids.  You may add other liquids in addition to water, such as clear juice or decaffeinated sports drinks, as tolerated or as told by your health care provider.  Avoid drinks with caffeine, such as coffee, tea, or soft drinks.  Avoid alcohol. What foods are recommended?     The items listed may not be a complete list. Talk with your health care provider about what dietary choices are best for you. Grains White rice. White, French, or pita breads (fresh or toasted), including plain rolls, buns, or bagels. White pasta. Saltine, soda, or graham crackers. Pretzels. Low-fiber cereal. Cooked cereals made with water (such as cornmeal, farina, or cream cereals). Plain muffins. Matzo. Melba toast. Zwieback. Vegetables Potatoes (without the skin). Most well-cooked and canned vegetables without skins or seeds. Tender lettuce. Fruits Apple sauce. Fruits canned in juice. Cooked apricots, cherries, grapefruit, peaches, pears, or plums. Fresh bananas and cantaloupe. Meats and other protein foods Baked or boiled chicken. Eggs. Tofu. Fish. Seafood. Smooth nut butters. Ground or well-cooked tender beef, ham, veal, lamb, pork, or poultry. Dairy Plain yogurt, kefir, and unsweetened liquid yogurt. Lactose-free milk, buttermilk, skim milk, or soy milk. Low-fat or nonfat hard cheese. Beverages Water. Low-calorie sports drinks. Fruit juices without pulp. Strained tomato and vegetable juices. Decaffeinated teas. Sugar-free beverages not sweetened with sugar alcohols. Oral rehydration solutions, if approved by your health care provider. Seasoning and other foods Bouillon, broth, or soups made from recommended foods. What foods are not recommended? The items listed may not be a complete list. Talk with your health care provider about what dietary choices are best for you. Grains Whole   grain, whole wheat, bran, or rye breads, rolls, pastas, and crackers. Wild or brown rice. Whole grain or bran cereals. Barley.  Oats and oatmeal. Corn tortillas or taco shells. Granola. Popcorn. Vegetables Raw vegetables. Fried vegetables. Cabbage, broccoli, Brussels sprouts, artichokes, baked beans, beet greens, corn, kale, legumes, peas, sweet potatoes, and yams. Potato skins. Cooked spinach and cabbage. Fruits Dried fruit, including raisins and dates. Raw fruits. Stewed or dried prunes. Canned fruits with syrup. Meat and other protein foods Fried or fatty meats. Deli meats. Chunky nut butters. Nuts and seeds. Beans and lentils. Bacon. Hot dogs. Sausage. Dairy High-fat cheeses. Whole milk, chocolate milk, and beverages made with milk, such as milk shakes. Half-and-half. Cream. sour cream. Ice cream. Beverages Caffeinated beverages (such as coffee, tea, soda, or energy drinks). Alcoholic beverages. Fruit juices with pulp. Prune juice. Soft drinks sweetened with high-fructose corn syrup or sugar alcohols. High-calorie sports drinks. Fats and oils Butter. Cream sauces. Margarine. Salad oils. Plain salad dressings. Olives. Avocados. Mayonnaise. Sweets and desserts Sweet rolls, doughnuts, and sweet breads. Sugar-free desserts sweetened with sugar alcohols such as xylitol and sorbitol. Seasoning and other foods Honey. Hot sauce. Chili powder. Gravy. Cream-based or milk-based soups. Pancakes and waffles. Summary  When you have diarrhea, the foods you eat and your eating habits are very important.  Make sure you get at least 8-10 cups of fluid each day, or enough to keep your urine clear or pale yellow.  Eat bland foods and gradually reintroduce healthy, nutrient-rich foods as tolerated, or as told by your health care provider.  Avoid high-fiber, fried, greasy, or spicy foods. This information is not intended to replace advice given to you by your health care provider. Make sure you discuss any questions you have with your health care provider. Document Released: 04/19/2003 Document Revised: 05/20/2018 Document Reviewed:  01/25/2016 Elsevier Patient Education  2020 [employer] Inc.  

## 2018-08-18 NOTE — Telephone Encounter (Signed)
Patient is going to need medical records sent to [facility] to extend her being out of work until 09/15/18.  She had been covered until 08/14/18.  Melissa LeydenElmyra Ricks   [phone]  Include  WIN # [phone]

## 2018-08-18 NOTE — Telephone Encounter (Signed)
Letter and note faxed to [place]. Patient was advised.

## 2018-08-18 NOTE — Telephone Encounter (Signed)
Did you tell you about this? Is it the office note?

## 2018-08-23 ENCOUNTER — Telehealth: Payer: Self-pay | Admitting: Physician Assistant

## 2018-08-23 NOTE — Telephone Encounter (Signed)
Patient advised.

## 2018-08-23 NOTE — Telephone Encounter (Signed)
I have her out until 8/5? What is she needing extended? That should be appropriate.

## 2018-08-23 NOTE — Telephone Encounter (Signed)
Pt called to extend her time off of work due to her still having coughing up stuff some sob  CB#  [phone]  thanks teri

## 2018-08-25 ENCOUNTER — Telehealth: Payer: Self-pay | Admitting: Physician Assistant

## 2018-08-25 DIAGNOSIS — R197 Diarrhea, unspecified: Secondary | ICD-10-CM

## 2018-08-25 MED ORDER — DICYCLOMINE HCL 10 MG PO CAPS: 10.0000 mg | ORAL_CAPSULE | Freq: Three times a day (TID) | ORAL | 0 refills | Status: DC

## 2018-08-25 NOTE — Telephone Encounter (Signed)
Has she tried Imodium?  Also she probably needs to rehydrate with gatorade, powerade or pedialyte.

## 2018-08-25 NOTE — Telephone Encounter (Signed)
Yes, patient states she has been taking Imodium for about 1 year now. Patient was advised to rehydrate. Patient states she wants something stronger than Imodium.

## 2018-08-25 NOTE — Telephone Encounter (Signed)
I will send bentyl for diarrhea. She needs stool cultures before antibiotic.

## 2018-08-25 NOTE — Telephone Encounter (Signed)
Patient was advised. Expressed understanding.  

## 2018-08-25 NOTE — Addendum Note (Signed)
Addended by: Mar Daring on: 08/25/2018 03:10 PM   Modules accepted: Orders

## 2018-08-25 NOTE — Telephone Encounter (Signed)
Pt called back it and is wanting to know if an antibiotic will help with her symptoms.  Please advise.   Thanks,   -Mickel Baas

## 2018-08-25 NOTE — Telephone Encounter (Signed)
Pt called saying she had an e visit with her therapist this morning and she told her that she looked very tired and pasty.  She told her that something needs to be done regarding her diarrhea.  She was diagnosed with Covid in June.  CB#  [phone]  thanks teri

## 2018-08-30 ENCOUNTER — Other Ambulatory Visit: Payer: Self-pay

## 2018-08-30 DIAGNOSIS — R197 Diarrhea, unspecified: Secondary | ICD-10-CM | POA: Diagnosis not present

## 2018-08-30 DIAGNOSIS — Z20822 Contact with and (suspected) exposure to covid-19: Secondary | ICD-10-CM

## 2018-09-03 LAB — CDIFF NAA+O+P+STOOL CULTURE
E coli, Shiga toxin Assay: NEGATIVE
Toxigenic C. Difficile by PCR: NEGATIVE

## 2018-09-03 LAB — NOVEL CORONAVIRUS, NAA: SARS-CoV-2, NAA: NOT DETECTED

## 2018-09-06 ENCOUNTER — Telehealth: Payer: Self-pay | Admitting: Physician Assistant

## 2018-09-06 NOTE — Telephone Encounter (Signed)
Form completed.

## 2018-09-06 NOTE — Telephone Encounter (Signed)
Pt called saying she dropped off a form last Friday to release back to work.  She would like to go back ASAP  CB#  [phone]  Thanks  [employer]

## 2018-09-07 ENCOUNTER — Telehealth: Payer: Self-pay | Admitting: Physician Assistant

## 2018-09-07 NOTE — Telephone Encounter (Signed)
Pt called back saying that she retrieved a copy of her report and does not need [place] to print one off for her.  Con [employer]os

## 2018-09-07 NOTE — Telephone Encounter (Signed)
Called patient that she can go ahead and print the results from my chart but that I will placed a copy up front for her to pick up.

## 2018-09-07 NOTE — Telephone Encounter (Signed)
Pt needing to pick up a copy of her COVID test todayshowing negative to be abel to go back to work tomorrow.  Please have ready and call pt when it's ready for pickup asap.  Thanks, [employer]

## 2018-09-10 ENCOUNTER — Other Ambulatory Visit: Payer: Self-pay

## 2018-09-11 ENCOUNTER — Other Ambulatory Visit: Payer: Self-pay | Admitting: Physician Assistant

## 2018-09-11 DIAGNOSIS — E119 Type 2 diabetes mellitus without complications: Secondary | ICD-10-CM

## 2018-10-01 ENCOUNTER — Emergency Department: Payer: Medicare Other

## 2018-10-01 ENCOUNTER — Emergency Department
Admission: EM | Admit: 2018-10-01 | Discharge: 2018-10-01 | Disposition: A | Discharge status: Home / Self Care | Payer: Medicare Other | Attending: Emergency Medicine | Admitting: Emergency Medicine

## 2018-10-01 ENCOUNTER — Other Ambulatory Visit: Payer: Self-pay | Admitting: Physician Assistant

## 2018-10-01 DIAGNOSIS — S4992XA Unspecified injury of left shoulder and upper arm, initial encounter: Secondary | ICD-10-CM | POA: Diagnosis not present

## 2018-10-01 DIAGNOSIS — Z7984 Long term (current) use of oral hypoglycemic drugs: Secondary | ICD-10-CM | POA: Diagnosis not present

## 2018-10-01 DIAGNOSIS — M25512 Pain in left shoulder: Secondary | ICD-10-CM

## 2018-10-01 DIAGNOSIS — I10 Essential (primary) hypertension: Secondary | ICD-10-CM | POA: Diagnosis not present

## 2018-10-01 DIAGNOSIS — E119 Type 2 diabetes mellitus without complications: Secondary | ICD-10-CM | POA: Diagnosis not present

## 2018-10-01 DIAGNOSIS — Z79899 Other long term (current) drug therapy: Secondary | ICD-10-CM | POA: Diagnosis not present

## 2018-10-01 DIAGNOSIS — R197 Diarrhea, unspecified: Secondary | ICD-10-CM

## 2018-10-01 MED ORDER — CYCLOBENZAPRINE HCL 10 MG PO TABS: 10.0000 mg | ORAL_TABLET | Freq: Three times a day (TID) | ORAL | 0 refills | Status: DC | PRN

## 2018-10-01 MED ORDER — CYCLOBENZAPRINE HCL 10 MG PO TABS
10.0000 mg | ORAL_TABLET | Freq: Once | ORAL | Status: AC
  Administered 2018-10-01: 10 mg via ORAL
  Filled 2018-10-01: qty 1

## 2018-10-01 NOTE — ED Triage Notes (Signed)
Patient c/o left shoulder pain. Patient was pulling herself into truck and believes she tore something.

## 2018-10-07 NOTE — ED Provider Notes (Signed)
[facility] Emergency Department Provider Note   None    (approximate)  I have reviewed the triage vital signs and the nursing notes.   HISTORY  Chief Complaint Shoulder Pain    HPI Melissa Arroyo is a 68 y.o. female with below list of previous medical conditions presents to the emergency department secondary to nontraumatic left shoulder pain.  Patient states that she was pulling herself up into a truck when she felt a pop with immediate pain in the left shoulder.  Patient states current pain score is 9 out of 10 and worse with any movement of the left shoulder.  Patient denies any neck pain.  Patient denies any arm weakness.        Past Medical History:  Diagnosis Date  . Anxiety   . Depression   . Diabetes mellitus ([place])   . Heart attack ([place]) 04/2007  . Heart attack ([place])   . Hyperlipidemia   . Hypertension   . Obesity   . Recurrent UTI   . Renal lesion   . Urinary incontinence in female   . Vaginal atrophy     Patient Active Problem List   Diagnosis Date Noted  . Depression, recurrent ([place]) 05/26/2016  . Benign essential HTN 02/14/2015  . Dysuria 12/24/2014  . Recurrent UTI 09/28/2014  . Dyspareunia in female 09/28/2014  . Atrophic vaginitis 09/28/2014  . Urge incontinence 09/28/2014  . Renal cyst, left 07/24/2014  . Microscopic hematuria 07/24/2014  . Headache, tension-type 07/24/2014  . Combined fat and carbohydrate induced hyperlipemia 07/24/2014  . Diabetes mellitus, type 2 ([place]) 07/24/2014    Past Surgical History:  Procedure Laterality Date  . BUNIONECTOMY  2001  . CARDIAC CATHETERIZATION    . NECK SURGERY    . ROTATOR CUFF REPAIR Left     Prior to Admission medications   Medication Sig Start Date End Date Taking? Authorizing Provider  albuterol (VENTOLIN HFA) 108 (90 Base) MCG/ACT inhaler Inhale 1-2 puffs into the lungs every 6 (six) hours as needed for wheezing or shortness of breath. Patient not taking: Reported  on 08/18/2018 08/02/18   Mar Daring, PA-C  Alcohol Swabs (ALCOHOL PREP) PADS Use 1 pad prior to checking blood sugar to clean skin Patient not taking: Reported on 12/23/2017 09/27/15   Mar Daring, PA-C  Blood Glucose Monitoring Suppl (FREESTYLE LITE) DEVI Check fasting blood sugar q a.m. Patient not taking: Reported on 12/23/2017 02/25/17   Mar Daring, PA-C  Brexpiprazole (REXULTI) 2 MG TABS Take 1 tablet by mouth daily.     [provider]  clonazePAM (KLONOPIN) 1 MG tablet TAKE ONE TABLET BY MOUTH AT BEDTIME.  MAY TAKE ONE-HALF TABLET EVERY 8 HOURS DURING THE DAY FOR PANIC ATTACKS IF NEEDED 04/23/16   Mar Daring, PA-C  cyclobenzaprine (FLEXERIL) 10 MG tablet Take 1 tablet (10 mg total) by mouth 3 (three) times daily as needed. 10/01/18   Gregor Hams, MD  dicyclomine (BENTYL) 10 MG capsule TAKE 1 CAPSULE(10 MG) BY MOUTH FOUR TIMES DAILY BEFORE MEALS AND AT BEDTIME 10/01/18   Fenton Malling M, PA-C  estradiol (ESTRACE) 0.1 MG/GM vaginal cream Place 1 Applicatorful vaginally at bedtime. 09/30/16   Mar Daring, PA-C  FLUoxetine (PROZAC) 40 MG capsule TK 1 C PO D 07/30/17   [provider]  glipiZIDE (GLUCOTROL XL) 10 MG 24 hr tablet TAKE 1 TABLET(10 MG) BY MOUTH DAILY WITH BREAKFAST 09/12/18   Mar Daring, PA-C  Glucosamine-Chondroitin (  MOVE FREE PO) Take 1 tablet by mouth daily.    [provider]  glucose blood (FREESTYLE LITE) test strip Check fasting blood sugar q a.m. Patient not taking: Reported on 12/23/2017 11/23/15   Mar Daring, PA-C  HYDROcodone-acetaminophen [facility]) 7.5-325 MG tablet Take 1 tablet by mouth every 8 (eight) hours as needed for moderate pain. Patient not taking: Reported on 08/18/2018 04/28/18   Mar Daring, PA-C  ibuprofen (ADVIL,MOTRIN) 200 MG tablet Take 400 mg by mouth at bedtime.    [provider]  Lancets (FREESTYLE) lancets Check fasting blood glucose q a.m  Patient not taking: Reported on 12/23/2017 11/23/15   Mar Daring, PA-C  lovastatin (MEVACOR) 40 MG tablet TAKE 1 TABLET(40 MG) BY MOUTH AT BEDTIME 06/14/18   Mar Daring, PA-C  metFORMIN (GLUCOPHAGE) 1000 MG tablet TAKE 1 TABLET(1000 MG) BY MOUTH TWICE DAILY WITH A MEAL 08/17/18   Burnette, Clearnce Sorrel, PA-C  Multiple Vitamins-Minerals (CENTRUM SILVER PO) Take 1 tablet by mouth daily.    [provider]  OLANZapine (ZYPREXA) 5 MG tablet TK 1 T PO D 07/31/17   [provider]  propranolol (INDERAL) 10 MG tablet TAKE 1 TABLET(10 MG) BY MOUTH TWICE DAILY 06/14/18   Fenton Malling M, PA-C  glipiZIDE (GLUCOTROL XL) 10 MG 24 hr tablet Take 1 tablet (10 mg total) by mouth daily with breakfast. 08/24/17   Mar Daring, PA-C    Allergies Penicillins and Vesicare [solifenacin]  Family History  Problem Relation Age of Onset  . Hypertension Mother   . Cancer Mother        Luekemia  . Diabetes Mother   . Cancer Other        Bladder,Kidney,Prostate  . Uterine cancer Maternal Grandmother   . Breast cancer Neg Hx     Social History Social History   Tobacco Use  . Smoking status: Never Smoker  . Smokeless tobacco: Never Used  Substance Use Topics  . Alcohol use: No    Alcohol/week: 0.0 standard drinks    Comment: Quit in 04/24/1993  . Drug use: No    Review of Systems Constitutional: No fever/chills Eyes: No visual changes. ENT: No sore throat. Cardiovascular: Denies chest pain. Respiratory: Denies shortness of breath. Gastrointestinal: No abdominal pain.  No nausea, no vomiting.  No diarrhea.  No constipation. Genitourinary: Negative for dysuria. Musculoskeletal: Negative for neck pain.  Negative for back pain.  Positive for left shoulder pain. Integumentary: Negative for rash. Neurological: Negative for headaches, focal weakness or numbness.  ____________________________________________   PHYSICAL EXAM:  VITAL SIGNS: ED Triage Vitals   Enc Vitals Group     BP 10/01/18 0319 (!) 183/63     Pulse Rate 10/01/18 0319 77     Resp 10/01/18 0319 20     Temp 10/01/18 0319 98 F (36.7 C)     Temp Source 10/01/18 0319 Oral     SpO2 10/01/18 0319 99 %     Weight 10/01/18 0315 84.8 kg (186 lb 15.2 oz)     Height --      Head Circumference --      Peak Flow --      Pain Score 10/01/18 0315 10     Pain Loc --      Pain Edu? --      Excl. in [place]? --     Constitutional: Alert and oriented.  Eyes: Conjunctivae are normal.  Mouth/Throat: Mucous membranes are moist. Neck: No stridor.  No meningeal  signs.   Cardiovascular: Normal rate, regular rhythm. Good peripheral circulation. Grossly normal heart sounds. Respiratory: Normal respiratory effort.  No retractions. Musculoskeletal: Pain with active and passive range of motion of the left shoulder.  Pain with lateral palpation of the shoulder. Neurologic:  Normal speech and language. No gross focal neurologic deficits are appreciated.  Skin:  Skin is warm, dry and intact. Psychiatric: Mood and affect are normal. Speech and behavior are normal.   RADIOLOGY I, [place], personally viewed and evaluated these images (plain radiographs) as part of my medical decision making, as well as reviewing the written report by the radiologist.  ED MD interpretation: Mild glenohumeral osteoarthritis.  Mild irregularity of the lateral humeral head in the region of the rotator cuff insertion per radiologist on left shoulder x-ray interpretation.   Procedures   ____________________________________________   INITIAL IMPRESSION / MDM / ASSESSMENT AND PLAN / ED COURSE  As part of my medical decision making, I reviewed the following data within the electronic MEDICAL RECORD NUMBER   68 year old female presented with above-stated history and physical exam secondary to left shoulder pain.  Concern for possible rotator cuff injury.  Sling applied.  Patient given Flexeril in the emergency  department.  Patient referred to orthopedic surgery for further outpatient evaluation and management.  ____________________________________________  FINAL CLINICAL IMPRESSION(S) / ED DIAGNOSES  Final diagnoses:  Acute pain of left shoulder     MEDICATIONS GIVEN DURING THIS VISIT:  Medications  cyclobenzaprine (FLEXERIL) tablet 10 mg (10 mg Oral Given 10/01/18 0428)     ED Discharge Orders         Ordered    cyclobenzaprine (FLEXERIL) 10 MG tablet  3 times daily PRN     10/01/18 0435          *Please note:  Melissa Arroyo was evaluated in Emergency Department on 10/07/2018 for the symptoms described in the history of present illness. She was evaluated in the context of the global COVID-19 pandemic, which necessitated consideration that the patient might be at risk for infection with the SARS-CoV-2 virus that causes COVID-19. Institutional protocols and algorithms that pertain to the evaluation of patients at risk for COVID-19 are in a state of rapid change based on information released by regulatory bodies including the CDC and federal and state organizations. These policies and algorithms were followed during the patient's care in the ED.  Some ED evaluations and interventions may be delayed as a result of limited staffing during the pandemic.*  Note:  This document was prepared using Dragon voice recognition software and may include unintentional dictation errors.   Gregor Hams, MD 10/07/18 [phone]

## 2018-11-09 DIAGNOSIS — F410 Panic disorder [episodic paroxysmal anxiety] without agoraphobia: Secondary | ICD-10-CM | POA: Diagnosis not present

## 2018-11-09 DIAGNOSIS — F900 Attention-deficit hyperactivity disorder, predominantly inattentive type: Secondary | ICD-10-CM | POA: Diagnosis not present

## 2018-11-09 DIAGNOSIS — F3341 Major depressive disorder, recurrent, in partial remission: Secondary | ICD-10-CM | POA: Diagnosis not present

## 2018-11-09 DIAGNOSIS — F4011 Social phobia, generalized: Secondary | ICD-10-CM | POA: Diagnosis not present

## 2018-12-20 ENCOUNTER — Other Ambulatory Visit: Payer: Self-pay

## 2018-12-20 DIAGNOSIS — R197 Diarrhea, unspecified: Secondary | ICD-10-CM

## 2018-12-20 MED ORDER — DICYCLOMINE HCL 10 MG PO CAPS: ORAL_CAPSULE | ORAL | 5 refills | Status: DC

## 2018-12-20 NOTE — Telephone Encounter (Signed)
Patient is requesting a refill on her Dicyclomine 10 MG. L.O.V. was on 08/18/2018.

## 2018-12-22 DIAGNOSIS — R2230 Localized swelling, mass and lump, unspecified upper limb: Secondary | ICD-10-CM | POA: Insufficient documentation

## 2018-12-22 DIAGNOSIS — M720 Palmar fascial fibromatosis [Dupuytren]: Secondary | ICD-10-CM | POA: Insufficient documentation

## 2018-12-23 ENCOUNTER — Ambulatory Visit: Payer: Medicare Other | Admitting: Physician Assistant

## 2018-12-27 NOTE — Progress Notes (Signed)
Patient: Melissa Arroyo Female    DOB: 12-May-1950   68 y.o.   MRN: [id number] Visit Date: 12/28/2018  Today's Provider: Mar Daring, PA-C   Chief Complaint  Patient presents with  . Diabetes   Subjective:     HPI   Diabetes Mellitus Type II, Follow-up:   Lab Results  Component Value Date   HGBA1C 7.5 (A) 12/28/2018   HGBA1C 7.2 (H) 03/15/2018   HGBA1C 7.2 (A) 11/05/2017    Last seen for diabetes 8 months ago.  Management since then includes none. She reports excellent compliance with treatment. She is not having side effects.  Current symptoms include none and have been stable. Home blood sugar records: fasting range: not being checked  Episodes of hypoglycemia? no   Current insulin regiment: Is not on insulin Most Recent Eye Exam: UTD Weight trend: stable Prior visit with dietician: No Current exercise: none Current diet habits: in general, a "healthy" diet    Pertinent Labs:    Component Value Date/Time   CHOL 216 (H) 03/30/2017 1010   TRIG 121 03/30/2017 1010   HDL 93 03/30/2017 1010   LDLCALC 99 03/30/2017 1010   LDLCALC 105 (H) 11/21/2016 1202   CREATININE 0.74 07/31/2018 1334   CREATININE 0.76 11/21/2016 1202    Wt Readings from Last 3 Encounters:  12/28/18 195 lb (88.5 kg)  10/01/18 186 lb 15.2 oz (84.8 kg)  08/18/18 187 lb (84.8 kg)   ------------------------------------------------------------------------   Allergies  Allergen Reactions  . Penicillins Rash  . Vesicare [Solifenacin] Anaphylaxis    Throat Closed     Current Outpatient Medications:  .  clonazePAM (KLONOPIN) 1 MG tablet, TAKE ONE TABLET BY MOUTH AT BEDTIME.  MAY TAKE ONE-HALF TABLET EVERY 8 HOURS DURING THE DAY FOR PANIC ATTACKS IF NEEDED, Disp: 180 tablet, Rfl: 1 .  dicyclomine (BENTYL) 10 MG capsule, TAKE 1 CAPSULE(10 MG) BY MOUTH FOUR TIMES DAILY BEFORE MEALS AND AT BEDTIME, Disp: 120 capsule, Rfl: 5 .  estradiol (ESTRACE) 0.1 MG/GM vaginal cream, Place  1 Applicatorful vaginally at bedtime., Disp: 42.5 g, Rfl: 12 .  FLUoxetine (PROZAC) 40 MG capsule, TK 1 C PO D, Disp: , Rfl: 0 .  glipiZIDE (GLUCOTROL XL) 10 MG 24 hr tablet, TAKE 1 TABLET(10 MG) BY MOUTH DAILY WITH BREAKFAST, Disp: 90 tablet, Rfl: 3 .  Glucosamine-Chondroitin (MOVE FREE PO), Take 1 tablet by mouth daily., Disp: , Rfl:  .  ibuprofen (ADVIL,MOTRIN) 200 MG tablet, Take 400 mg by mouth at bedtime., Disp: , Rfl:  .  lovastatin (MEVACOR) 40 MG tablet, TAKE 1 TABLET(40 MG) BY MOUTH AT BEDTIME, Disp: 90 tablet, Rfl: 3 .  metFORMIN (GLUCOPHAGE) 1000 MG tablet, TAKE 1 TABLET(1000 MG) BY MOUTH TWICE DAILY WITH A MEAL, Disp: 180 tablet, Rfl: 3 .  Multiple Vitamins-Minerals (CENTRUM SILVER PO), Take 1 tablet by mouth daily., Disp: , Rfl:  .  OLANZapine (ZYPREXA) 5 MG tablet, TK 1 T PO D, Disp: , Rfl: 1 .  propranolol (INDERAL) 10 MG tablet, TAKE 1 TABLET(10 MG) BY MOUTH TWICE DAILY, Disp: 180 tablet, Rfl: 3 .  Alcohol Swabs (ALCOHOL PREP) PADS, Use 1 pad prior to checking blood sugar to clean skin (Patient not taking: Reported on 12/23/2017), Disp: 100 each, Rfl: 3 .  Blood Glucose Monitoring Suppl (FREESTYLE LITE) DEVI, Check fasting blood sugar q a.m. (Patient not taking: Reported on 12/23/2017), Disp: 1 each, Rfl: 0 .  glucose blood (FREESTYLE LITE) test strip, Check fasting  blood sugar q a.m. (Patient not taking: Reported on 12/23/2017), Disp: 100 each, Rfl: 12 .  Lancets (FREESTYLE) lancets, Check fasting blood glucose q a.m (Patient not taking: Reported on 12/23/2017), Disp: 100 each, Rfl: 12  Review of Systems  Constitutional: Negative.   Eyes: Negative.   Respiratory: Negative.   Cardiovascular: Negative.   Endocrine: Negative.   Neurological: Negative.     Social History   Tobacco Use  . Smoking status: Never Smoker  . Smokeless tobacco: Never Used  Substance Use Topics  . Alcohol use: No    Alcohol/week: 0.0 standard drinks    Comment: Quit in 04/24/1993       Objective:   BP 122/75 (BP Location: Left Arm, Patient Position: Sitting, Cuff Size: Large)   Pulse 68   Temp (!) 97.3 F (36.3 C) (Temporal)   Resp 16   Ht 5\' 5"  (1.651 m)   Wt 195 lb (88.5 kg)   BMI 32.45 kg/m  Vitals:   12/28/18 1526  BP: 122/75  Pulse: 68  Resp: 16  Temp: (!) 97.3 F (36.3 C)  TempSrc: Temporal  Weight: 195 lb (88.5 kg)  Height: 5\' 5"  (1.651 m)  Body mass index is 32.45 kg/m.   Physical Exam Vitals signs reviewed.  Constitutional:      General: She is not in acute distress.    Appearance: Normal appearance. She is well-developed. She is obese. She is not ill-appearing or diaphoretic.  Neck:     Musculoskeletal: Normal range of motion and neck supple.  Cardiovascular:     Rate and Rhythm: Normal rate and regular rhythm.     Heart sounds: Normal heart sounds. No murmur. No friction rub. No gallop.   Pulmonary:     Effort: Pulmonary effort is normal. No respiratory distress.     Breath sounds: Normal breath sounds. No wheezing or rales.  Neurological:     Mental Status: She is alert.    Diabetic Foot Exam - Simple   Simple Foot Form Diabetic Foot exam was performed with the following findings: Yes 12/28/2018  5:38 PM  Visual Inspection No deformities, no ulcerations, no other skin breakdown bilaterally: Yes Sensation Testing Intact to touch and monofilament testing bilaterally: Yes Pulse Check Posterior Tibialis and Dorsalis pulse intact bilaterally: Yes Comments     Results for orders placed or performed in visit on 12/28/18  POCT HgB A1C  Result Value Ref Range   Hemoglobin A1C 7.5 (A) 4.0 - 5.6 %   Est. average glucose Bld gHb Est-mCnc 169        Assessment & Plan    1. Type 2 diabetes mellitus without complication, without long-term current use of insulin (HCC) A1c increased to 7.5 from 7.2. Continue Metformin 1000mg  BID and glipizide XR 10mg  daily. Add Jardiance 10mg  as below. I will see her back in 2-3 months for her AWV.  -  empagliflozin (JARDIANCE) 10 MG TABS tablet; Take 10 mg by mouth daily before breakfast.  Dispense: 90 tablet; Refill: 1  2. Class 1 obesity due to excess calories with serious comorbidity and body mass index (BMI) of 32.0 to 32.9 in adult Counseled patient on healthy lifestyle modifications including dieting and exercise.   3. Need for influenza vaccination Flu vaccine given today without complication. Patient sat upright for 15 minutes to check for adverse reaction before being released. - Flu Vaccine QUAD High Dose(Fluad)     Mar Daring, PA-C  [facility]

## 2018-12-28 ENCOUNTER — Other Ambulatory Visit: Payer: Self-pay

## 2018-12-28 ENCOUNTER — Ambulatory Visit (INDEPENDENT_AMBULATORY_CARE_PROVIDER_SITE_OTHER): Payer: Medicare Other | Admitting: Physician Assistant

## 2018-12-28 ENCOUNTER — Encounter: Payer: Self-pay | Admitting: Physician Assistant

## 2018-12-28 VITALS — BP 122/75 | HR 68 | Temp 97.3°F | Resp 16 | Ht 65.0 in | Wt 195.0 lb

## 2018-12-28 DIAGNOSIS — E119 Type 2 diabetes mellitus without complications: Secondary | ICD-10-CM

## 2018-12-28 DIAGNOSIS — E6609 Other obesity due to excess calories: Secondary | ICD-10-CM | POA: Diagnosis not present

## 2018-12-28 DIAGNOSIS — Z6832 Body mass index (BMI) 32.0-32.9, adult: Secondary | ICD-10-CM

## 2018-12-28 DIAGNOSIS — Z23 Encounter for immunization: Secondary | ICD-10-CM | POA: Diagnosis not present

## 2018-12-28 LAB — POCT GLYCOSYLATED HEMOGLOBIN (HGB A1C)
Est. average glucose Bld gHb Est-mCnc: 169
Hemoglobin A1C: 7.5 % — AB (ref 4.0–5.6)

## 2018-12-28 MED ORDER — EMPAGLIFLOZIN 10 MG PO TABS: 10.0000 mg | ORAL_TABLET | Freq: Every day | ORAL | 1 refills | Status: DC

## 2018-12-28 NOTE — Patient Instructions (Signed)
Empagliflozin oral tablets What is this medicine? EMPAGLIFLOZIN (EM pa gli FLOE zin) helps to treat type 2 diabetes. It helps to control blood sugar. This drug may also reduce the risk of heart attack or stroke if you have type 2 diabetes and risk factors for heart disease. Treatment is combined with diet and exercise. This medicine may be used for other purposes; ask your health care provider or pharmacist if you have questions. COMMON BRAND NAME(S): JARDIANCE What should I tell my health care provider before I take this medicine? They need to know if you have any of these conditions:  dehydration  diabetic ketoacidosis  diet low in salt  eating less due to illness, surgery, dieting, or any other reason  having surgery  high cholesterol  high levels of potassium in the blood  history of pancreatitis or pancreas problems  history of yeast infection of the penis or vagina  if you often drink alcohol  infections in the bladder, kidneys, or urinary tract  kidney disease  liver disease  low blood pressure  on hemodialysis  problems urinating  type 1 diabetes  uncircumcised female  an unusual or allergic reaction to empagliflozin, other medicines, foods, dyes, or preservatives  pregnant or trying to get pregnant  breast-feeding How should I use this medicine? Take this medicine by mouth with a glass of water. Follow the directions on the prescription label. Take it in the morning, with or without food. Take your dose at the same time each day. Do not take more often than directed. Do not stop taking except on your doctor's advice. Talk to your pediatrician regarding the use of this medicine in children. Special care may be needed. Overdosage: If you think you have taken too much of this medicine contact a poison control center or emergency room at once. NOTE: This medicine is only for you. Do not share this medicine with others. What if I miss a dose? If you miss a  dose, take it as soon as you can. If it is almost time for your next dose, take only that dose. Do not take double or extra doses. What may interact with this medicine? Do not take this medicine with any of the following medications:  gatifloxacin This medicine may also interact with the following medications:  alcohol  certain medicines for blood pressure, heart disease  diuretics This list may not describe all possible interactions. Give your health care provider a list of all the medicines, herbs, non-prescription drugs, or dietary supplements you use. Also tell them if you smoke, drink alcohol, or use illegal drugs. Some items may interact with your medicine. What should I watch for while using this medicine? Visit your doctor or health care professional for regular checks on your progress. This medicine can cause a serious condition in which there is too much acid in the blood. If you develop nausea, vomiting, stomach pain, unusual tiredness, or breathing problems, stop taking this medicine and call your doctor right away. If possible, use a ketone dipstick to check for ketones in your urine. A test called the HbA1C (A1C) will be monitored. This is a simple blood test. It measures your blood sugar control over the last 2 to 3 months. You will receive this test every 3 to 6 months. Learn how to check your blood sugar. Learn the symptoms of low and high blood sugar and how to manage them. Always carry a quick-source of sugar with you in case you have symptoms of low  blood sugar. Examples include hard sugar candy or glucose tablets. Make sure others know that you can choke if you eat or drink when you develop serious symptoms of low blood sugar, such as seizures or unconsciousness. They must get medical help at once. Tell your doctor or health care professional if you have high blood sugar. You might need to change the dose of your medicine. If you are sick or exercising more than usual, you  might need to change the dose of your medicine. Do not skip meals. Ask your doctor or health care professional if you should avoid alcohol. Many nonprescription cough and cold products contain sugar or alcohol. These can affect blood sugar. Wear a medical ID bracelet or chain, and carry a card that describes your disease and details of your medicine and dosage times. What side effects may I notice from receiving this medicine? Side effects that you should report to your doctor or health care professional as soon as possible:  allergic reactions like skin rash, itching or hives, swelling of the face, lips, or tongue  breathing problems  dizziness  feeling faint or lightheaded, falls  muscle weakness  nausea, vomiting, unusual stomach upset or pain  penile discharge, itching, or pain in men  signs and symptoms of a genital infection, such as fever; tenderness, redness, or swelling in the genitals or area from the genitals to the back of the rectum  signs and symptoms of low blood sugar such as feeling anxious, confusion, dizziness, increased hunger, unusually weak or tired, sweating, shakiness, cold, irritable, headache, blurred vision, fast heartbeat, loss of consciousness  signs and symptoms of a urinary tract infection, such as fever, chills, a burning feeling when urinating, blood in the urine, back pain  trouble passing urine or change in the amount of urine, including an urgent need to urinate more often, in larger amounts, or at night  unusual tiredness  vaginal discharge, itching, or odor in women Side effects that usually do not require medical attention (report to your doctor or health care professional if they continue or are bothersome):  mild increase in urination  thirsty This list may not describe all possible side effects. Call your doctor for medical advice about side effects. You may report side effects to FDA at 1-800-FDA-1088. Where should I keep my  medicine? Keep out of the reach of children. Store at room temperature between 20 and 25 degrees C (68 and 77 degrees F). Throw away any unused medicine after the expiration date. NOTE: This sheet is a summary. It may not cover all possible information. If you have questions about this medicine, talk to your doctor, pharmacist, or health care provider.  2020 Elsevier/Gold Standard (2016-10-09 10:25:34)

## 2019-01-03 ENCOUNTER — Telehealth: Payer: Self-pay | Admitting: Physician Assistant

## 2019-01-03 DIAGNOSIS — E1129 Type 2 diabetes mellitus with other diabetic kidney complication: Secondary | ICD-10-CM

## 2019-01-03 MED ORDER — PIOGLITAZONE HCL 30 MG PO TABS: 30.0000 mg | ORAL_TABLET | Freq: Every day | ORAL | 0 refills | Status: DC

## 2019-01-03 NOTE — Telephone Encounter (Signed)
Sent in Pioglitazone as this is the only remaining generic medication to try for diabetes.

## 2019-01-03 NOTE — Telephone Encounter (Signed)
Pt calling.  States she was told to go on empagliflozin (JARDIANCE) 10 MG TABS tablet.  Pt states that a 90 day supply will be over $500 and she can not afford that.  Would like to know what she needs to do from here.

## 2019-01-03 NOTE — Telephone Encounter (Signed)
Is there something else she can try?  Thanks,   -Mickel Baas

## 2019-01-04 NOTE — Telephone Encounter (Signed)
Tried calling; no answer.   Thanks,   -Luke Falero  

## 2019-01-05 NOTE — Telephone Encounter (Signed)
Tried calling; no answer.   Thanks,   -Quanah Majka  

## 2019-01-20 ENCOUNTER — Other Ambulatory Visit: Payer: Self-pay

## 2019-01-20 DIAGNOSIS — Z20822 Contact with and (suspected) exposure to covid-19: Secondary | ICD-10-CM

## 2019-01-20 DIAGNOSIS — Z20828 Contact with and (suspected) exposure to other viral communicable diseases: Secondary | ICD-10-CM | POA: Diagnosis not present

## 2019-01-22 LAB — NOVEL CORONAVIRUS, NAA: SARS-CoV-2, NAA: NOT DETECTED

## 2019-02-21 DIAGNOSIS — F410 Panic disorder [episodic paroxysmal anxiety] without agoraphobia: Secondary | ICD-10-CM | POA: Diagnosis not present

## 2019-02-21 DIAGNOSIS — F900 Attention-deficit hyperactivity disorder, predominantly inattentive type: Secondary | ICD-10-CM | POA: Diagnosis not present

## 2019-02-21 DIAGNOSIS — F4011 Social phobia, generalized: Secondary | ICD-10-CM | POA: Diagnosis not present

## 2019-02-21 DIAGNOSIS — F3341 Major depressive disorder, recurrent, in partial remission: Secondary | ICD-10-CM | POA: Diagnosis not present

## 2019-04-04 ENCOUNTER — Other Ambulatory Visit: Payer: Self-pay | Admitting: Physician Assistant

## 2019-04-04 ENCOUNTER — Other Ambulatory Visit: Payer: Self-pay

## 2019-04-04 ENCOUNTER — Ambulatory Visit
Admission: RE | Admit: 2019-04-04 | Discharge: 2019-04-04 | Disposition: A | Discharge status: Home / Self Care | Payer: Medicare Other | Source: Ambulatory Visit | Attending: Physician Assistant | Admitting: Physician Assistant

## 2019-04-04 DIAGNOSIS — I10 Essential (primary) hypertension: Secondary | ICD-10-CM | POA: Diagnosis not present

## 2019-04-04 DIAGNOSIS — R079 Chest pain, unspecified: Secondary | ICD-10-CM | POA: Diagnosis not present

## 2019-04-04 DIAGNOSIS — E782 Mixed hyperlipidemia: Secondary | ICD-10-CM | POA: Diagnosis not present

## 2019-04-04 DIAGNOSIS — Z8616 Personal history of COVID-19: Secondary | ICD-10-CM | POA: Diagnosis not present

## 2019-04-04 DIAGNOSIS — E119 Type 2 diabetes mellitus without complications: Secondary | ICD-10-CM | POA: Diagnosis not present

## 2019-04-04 DIAGNOSIS — I493 Ventricular premature depolarization: Secondary | ICD-10-CM | POA: Diagnosis not present

## 2019-04-04 DIAGNOSIS — R0602 Shortness of breath: Secondary | ICD-10-CM | POA: Diagnosis not present

## 2019-04-04 MED ORDER — IOHEXOL 350 MG/ML SOLN
75.0000 mL | Freq: Once | INTRAVENOUS | Status: AC | PRN
  Administered 2019-04-04: 14:00:00 75 mL via INTRAVENOUS

## 2019-04-06 LAB — POCT I-STAT CREATININE: Creatinine, Ser: 0.8 mg/dL (ref 0.44–1.00)

## 2019-04-07 ENCOUNTER — Telehealth: Payer: Self-pay

## 2019-04-07 ENCOUNTER — Ambulatory Visit: Payer: Medicare Other | Admitting: Physician Assistant

## 2019-04-07 NOTE — Telephone Encounter (Signed)
LMTCB to inquire about a telephonic AWV*I prior to CPE on 04/18/19. Direct # left to CB.

## 2019-04-11 NOTE — Telephone Encounter (Signed)
Telephonic AWV scheduled for 04/14/19 @ 8:40 AM.

## 2019-04-11 NOTE — Telephone Encounter (Signed)
Pt LMTCB and stated that she can do a telephonic AWV this week. CB # [id number].

## 2019-04-12 ENCOUNTER — Other Ambulatory Visit: Payer: Self-pay | Admitting: Physician Assistant

## 2019-04-12 DIAGNOSIS — E1129 Type 2 diabetes mellitus with other diabetic kidney complication: Secondary | ICD-10-CM

## 2019-04-13 NOTE — Progress Notes (Signed)
Subjective:   Melissa Arroyo is a 69 y.o. female who presents for an Initial Medicare Annual Wellness Visit.    This visit is being conducted through telemedicine due to the COVID-19 pandemic. This patient has given me verbal consent via doximity to conduct this visit, patient states they are participating from their home address. Some vital signs may be absent or patient reported.    Patient identification: identified by name, DOB, and current address  Review of Systems    N/A   Cardiac Risk Factors include: advanced age (>81men, >77 women);diabetes mellitus;dyslipidemia     Objective:    Today's Vitals   04/14/19 0829 04/14/19 0830  PainSc: 0-No pain 0-No pain   There is no height or weight on file to calculate BMI. Unable to obtain vitals due to visit being conducted via telephonically.   Advanced Directives 04/14/2019 10/01/2018 07/31/2018 09/18/2016 12/27/2015 11/08/2015  Does Patient Have a Medical Advance Directive? Yes Yes No Yes Yes Yes  Type of Paramedic of [place];Living will Junior;Living will - Living will;Healthcare Power of [place];Living will  Does patient want to make changes to medical advance directive? - - - - - No - Patient declined  Copy of [place] in Chart? Yes - validated most recent copy scanned in chart (See row information) - - No - copy requested - -  Would patient like information on creating a medical advance directive? - No - Patient declined No - Patient declined - - -    Current Medications (verified) Outpatient Encounter Medications as of 04/14/2019  Medication Sig  . b complex vitamins capsule Take 1 capsule by mouth daily.  . Cholecalciferol (VITAMIN D3) 10 MCG (400 UNIT) tablet Take by mouth daily. Dose unknown  . dicyclomine (BENTYL) 10 MG capsule TAKE 1 CAPSULE(10 MG) BY MOUTH FOUR TIMES DAILY BEFORE MEALS AND AT BEDTIME  . estradiol (ESTRACE)  0.1 MG/GM vaginal cream Place 1 Applicatorful vaginally at bedtime.  Marland Kitchen FLUoxetine (PROZAC) 40 MG capsule TK 1 C PO D  . glipiZIDE (GLUCOTROL XL) 10 MG 24 hr tablet TAKE 1 TABLET(10 MG) BY MOUTH DAILY WITH BREAKFAST  . ibuprofen (ADVIL,MOTRIN) 200 MG tablet Take 400 mg by mouth at bedtime. As needed  . lovastatin (MEVACOR) 40 MG tablet TAKE 1 TABLET(40 MG) BY MOUTH AT BEDTIME  . metFORMIN (GLUCOPHAGE) 1000 MG tablet TAKE 1 TABLET(1000 MG) BY MOUTH TWICE DAILY WITH A MEAL  . Multiple Vitamins-Minerals (CENTRUM SILVER PO) Take 1 tablet by mouth daily.  Marland Kitchen OLANZapine (ZYPREXA) 5 MG tablet TK 1 T PO D  . pioglitazone (ACTOS) 30 MG tablet TAKE 1 TABLET(30 MG) BY MOUTH DAILY  . propranolol (INDERAL) 10 MG tablet TAKE 1 TABLET(10 MG) BY MOUTH TWICE DAILY  . zinc gluconate 50 MG tablet Take by mouth daily. Dose unknown  . Alcohol Swabs (ALCOHOL PREP) PADS Use 1 pad prior to checking blood sugar to clean skin (Patient not taking: Reported on 12/23/2017)  . clonazePAM (KLONOPIN) 1 MG tablet TAKE ONE TABLET BY MOUTH AT BEDTIME.  MAY TAKE ONE-HALF TABLET EVERY 8 HOURS DURING THE DAY FOR PANIC ATTACKS IF NEEDED (Patient not taking: Reported on 04/14/2019)  . Glucosamine-Chondroitin (MOVE FREE PO) Take 1 tablet by mouth daily.  . [DISCONTINUED] glipiZIDE (GLUCOTROL XL) 10 MG 24 hr tablet Take 1 tablet (10 mg total) by mouth daily with breakfast.   No facility-administered encounter medications on file as of 04/14/2019.  Allergies (verified) Penicillins and Vesicare [solifenacin]   History: Past Medical History:  Diagnosis Date  . Anxiety   . Depression   . Diabetes mellitus ([place])   . Heart attack ([place]) 04/2007  . Heart attack ([place])   . Hyperlipidemia   . Hypertension   . Obesity   . Recurrent UTI   . Renal lesion   . Urinary incontinence in female   . Vaginal atrophy    Past Surgical History:  Procedure Laterality Date  . BUNIONECTOMY  2001  . CARDIAC CATHETERIZATION    . NECK SURGERY    .  ROTATOR CUFF REPAIR Left    Family History  Problem Relation Age of Onset  . Hypertension Mother   . Cancer Mother        Luekemia  . Diabetes Mother   . Cancer Other        Bladder,Kidney,Prostate  . Uterine cancer Maternal Grandmother   . Breast cancer Neg Hx    Social History   Socioeconomic History  . Marital status: Married    Spouse name: Not on file  . Number of children: 0  . Years of education: Not on file  . Highest education level: Bachelor's degree (e.g., BA, AB, BS)  Occupational History    Employer: [employer]    Comment: full time  Tobacco Use  . Smoking status: Never Smoker  . Smokeless tobacco: Never Used  Substance and Sexual Activity  . Alcohol use: No    Alcohol/week: 0.0 standard drinks    Comment: Quit in 04/24/1993  . Drug use: No  . Sexual activity: Yes  Other Topics Concern  . Not on file  Social History Narrative  . Not on file   Social Determinants of Health   Financial Resource Strain: Low Risk   . Difficulty of Paying Living Expenses: Not hard at all  Food Insecurity: No Food Insecurity  . Worried About Charity fundraiser in the Last Year: Never true  . Ran Out of Food in the Last Year: Never true  Transportation Needs: No Transportation Needs  . Lack of Transportation (Medical): No  . Lack of Transportation (Non-Medical): No  Physical Activity: Insufficiently Active  . Days of Exercise per Week: 3 days  . Minutes of Exercise per Session: 10 min  Stress: Stress Concern Present  . Feeling of Stress : Rather much  Social Connections: Somewhat Isolated  . Frequency of Communication with Friends and Family: Once a week  . Frequency of Social Gatherings with Friends and Family: Never  . Attends Religious Services: Never  . Active Member of Clubs or Organizations: Yes  . Attends Archivist Meetings: More than 4 times per year  . Marital Status: Married    Tobacco Counseling Counseling given: Not Answered   Clinical  Intake:  Pre-visit preparation completed: Yes  Pain : No/denies pain Pain Score: 0-No pain     Nutritional Risks: None Diabetes: Yes  How often do you need to have someone help you when you read instructions, pamphlets, or other written materials from your doctor or pharmacy?: 1 - Never   Diabetes:  Is the patient diabetic?  Yes  If diabetic, was a CBG obtained today?  No  Did the patient bring in their glucometer from home?  No  How often do you monitor your CBG's? Does not check BS.   Financial Strains and Diabetes Management:  Are you having any financial strains with the device, your supplies or your medication?  No .  Does the patient want to be seen by Chronic Care Management for management of their diabetes?  No  Would the patient like to be referred to a Nutritionist or for Diabetic Management?  No   Diabetic Exams:  Diabetic Eye Exam: Completed 11/2018 per pt. Requested records to be faxed to clinic.  Diabetic Foot Exam: Currently due. Pt has been advised about the importance in completing this exam. Note made to follow up on this at next in office visit.    Interpreter Needed?: No  Information entered by :: [facility], LPN   Activities of Daily Living In your present state of health, do you have any difficulty performing the following activities: 04/14/2019 12/28/2018  Hearing? N N  Vision? N N  Difficulty concentrating or making decisions? N N  Walking or climbing stairs? N N  Dressing or bathing? N N  Doing errands, shopping? N N  Preparing Food and eating ? N -  Using the Toilet? N -  In the past six months, have you accidently leaked urine? N -  Do you have problems with loss of bowel control? N -  Managing your Medications? N -  Managing your Finances? N -  Housekeeping or managing your Housekeeping? N -  Some recent data might be hidden     Immunizations and Health Maintenance Immunization History  Administered Date(s) Administered  . Fluad  Quad(high Dose 65+) 12/28/2018  . Influenza, High Dose Seasonal PF 10/19/2017  . Influenza,inj,Quad PF,6+ Mos 12/03/2016  . Influenza-Unspecified 12/01/2015  . Pneumococcal Conjugate-13 12/01/2015  . Pneumococcal Polysaccharide-23 03/30/2017  . Tdap 03/30/2017   Health Maintenance Due  Topic Date Due  . Hepatitis C Screening  Oct 13, 1950  . Fecal DNA (Cologuard)  07/31/2018  . OPHTHALMOLOGY EXAM  09/03/2018  . URINE MICROALBUMIN  11/06/2018    Patient Care Team: Mar Daring, PA-C as PCP - General (Family Medicine) Corey Skains, MD as Consulting Physician (Cardiology) Abbie Sons, MD as Referring Physician (Psychiatry) Arelia Sneddon, [place] (Optometry)  Indicate any recent Medical Services you may have received from other than [facility] providers in the past year (date may be approximate).     Assessment:   This is a routine wellness examination for [place].  Hearing/Vision screen No exam data present  Dietary issues and exercise activities discussed: Current Exercise Habits: Home exercise routine, Type of exercise: walking, Time (Minutes): 10, Frequency (Times/Week): 3, Weekly Exercise (Minutes/Week): 30, Intensity: Mild, Exercise limited by: None identified  Goals    . DIET - INCREASE WATER INTAKE     Recommend to drink at least 6-8 8oz glasses of water per day.      Depression Screen PHQ 2/9 Scores 04/14/2019 12/28/2018 11/05/2017 08/05/2017 03/30/2017 11/21/2016 04/09/2016  PHQ - 2 Score 0 0 0 6 0 4 2  PHQ- 9 Score - 0 1 14 0 12 3    Fall Risk Fall Risk  04/14/2019 09/10/2018 03/30/2017 11/29/2015 11/08/2015  Falls in the past year? 1 (No Data) No (No Data) No  Comment - Emmi Telephone Survey: data to providers prior to load - no falls since previous visit -  Number falls in past yr: 0 (No Data) - - -  Comment - Emmi Telephone Survey Actual Response =  - - -  Injury with Fall? 0 - - - -  Follow up Falls prevention discussed - - - -    FALL RISK PREVENTION  PERTAINING TO THE HOME:  Any stairs in or around the  home? No  If so, are there any without handrails? N/A  Home free of loose throw rugs in walkways, pet beds, electrical cords, etc? Yes  Adequate lighting in your home to reduce risk of falls? Yes   ASSISTIVE DEVICES UTILIZED TO PREVENT FALLS:  Life alert? No  Use of a cane, walker or w/c? No  Grab bars in the bathroom? Yes  Shower chair or bench in shower? No  Elevated toilet seat or a handicapped toilet? No    TIMED UP AND GO:  Was the test performed? No .     Cognitive Function: Declined today.         Screening Tests Health Maintenance  Topic Date Due  . Hepatitis C Screening  02/18/1950  . Fecal DNA (Cologuard)  07/31/2018  . OPHTHALMOLOGY EXAM  09/03/2018  . URINE MICROALBUMIN  11/06/2018  . MAMMOGRAM  05/01/2019  . HEMOGLOBIN A1C  06/27/2019  . FOOT EXAM  12/28/2019  . TETANUS/TDAP  03/31/2027  . INFLUENZA VACCINE  Completed  . DEXA SCAN  Completed  . PNA vac Low Risk Adult  Completed    Qualifies for Shingles Vaccine? Yes . Due for Shingrix. Pt has been advised to call insurance company to determine out of pocket expense. Advised may also receive vaccine at local pharmacy or Health Dept. Verbalized acceptance and understanding.  Tdap: Up to date  Flu Vaccine: Up to date  Pneumococcal Vaccine: Completed series  Cancer Screenings:  Colorectal Screening: Cologuard completed 07/31/15. Repeat every 3 years. Ordered cologuard today.  Mammogram: Completed 04/16/18. Repeat every 1-2 years as advised.   Bone Density: Completed 04/30/17. Previous DEXA scan was normal. No repeat needed unless advised by a physician.  Lung Cancer Screening: (Low Dose CT Chest recommended if Age 70-80 years, 30 pack-year currently smoking OR have quit w/in 15years.) does not qualify.   Additional Screening:  Hepatitis C Screening: does qualify and would like this order added to her yearly BW orders next week.   Dental  Screening: Recommended annual dental exams for proper oral hygiene  Community Resource Referral:  CRR required this visit?  No       Plan:  I have personally reviewed and addressed the Medicare Annual Wellness questionnaire and have noted the following in the patient's chart:  A. Medical and social history B. Use of alcohol, tobacco or illicit drugs  C. Current medications and supplements D. Functional ability and status E.  Nutritional status F.  Physical activity G. Advance directives H. List of other physicians I.  Hospitalizations, surgeries, and ER visits in previous 12 months J.  [place] such as hearing and vision if needed, cognitive and depression L. Referrals and appointments   In addition, I have reviewed and discussed with patient certain preventive protocols, quality metrics, and best practice recommendations. A written personalized care plan for preventive services as well as general preventive health recommendations were provided to patient.   [place]ora Score, [place]   [id number]  Nurse Health Advisor    Nurse Notes: Pt needs a urine check and agreed to complete the Hep C lab at next in office apt. Cologuard ordered today. Requested previous eye exam notes to be faxed to clinic to update chart.

## 2019-04-14 ENCOUNTER — Ambulatory Visit (INDEPENDENT_AMBULATORY_CARE_PROVIDER_SITE_OTHER): Payer: Medicare Other

## 2019-04-14 ENCOUNTER — Other Ambulatory Visit: Payer: Self-pay

## 2019-04-14 DIAGNOSIS — Z1211 Encounter for screening for malignant neoplasm of colon: Secondary | ICD-10-CM

## 2019-04-14 DIAGNOSIS — Z0000 Encounter for general adult medical examination without abnormal findings: Secondary | ICD-10-CM

## 2019-04-14 NOTE — Patient Instructions (Signed)
Melissa Arroyo , Thank you for taking time to come for your Medicare Wellness Visit. I appreciate your ongoing commitment to your health goals. Please review the following plan we discussed and let me know if I can assist you in the future.   Screening recommendations/referrals: Colonoscopy: Colguard due, ordered today. Mammogram: Up to date, repeat 04/15/20 Bone Density: Up to date. Previous DEXA scan was normal. No repeat needed unless advised by a physician. Recommended yearly ophthalmology/optometry visit for glaucoma screening and checkup Recommended yearly dental visit for hygiene and checkup  Vaccinations: Influenza vaccine: Up to date Pneumococcal vaccine: Completed series Tdap vaccine: Up to date Shingles vaccine: Pt declines today.     Advanced directives: Currently on file.  Conditions/risks identified: Recommend to drink at least 6-8 8oz glasses of water per day.  Next appointment: 04/18/19 @ 9:00 AM with Fenton Malling. Declined scheduling an AWV for 2022 at this time,   Preventive Care 65 Years and Older, Female Preventive care refers to lifestyle choices and visits with your health care provider that can promote health and wellness. What does preventive care include?  A yearly physical exam. This is also called an annual well check.  Dental exams once or twice a year.  Routine eye exams. Ask your health care provider how often you should have your eyes checked.  Personal lifestyle choices, including:  Daily care of your teeth and gums.  Regular physical activity.  Eating a healthy diet.  Avoiding tobacco and drug use.  Limiting alcohol use.  Practicing safe sex.  Taking low-dose aspirin every day.  Taking vitamin and mineral supplements as recommended by your health care provider. What happens during an annual well check? The services and screenings done by your health care provider during your annual well check will depend on your age, overall health,  lifestyle risk factors, and family history of disease. Counseling  Your health care provider may ask you questions about your:  Alcohol use.  Tobacco use.  Drug use.  Emotional well-being.  Home and relationship well-being.  Sexual activity.  Eating habits.  History of falls.  Memory and ability to understand (cognition).  Work and work Statistician.  Reproductive health. Screening  You may have the following tests or measurements:  Height, weight, and BMI.  Blood pressure.  Lipid and cholesterol levels. These may be checked every 5 years, or more frequently if you are over 39 years old.  Skin check.  Lung cancer screening. You may have this screening every year starting at age 35 if you have a 30-pack-year history of smoking and currently smoke or have quit within the past 15 years.  Fecal occult blood test (FOBT) of the stool. You may have this test every year starting at age 23.  Flexible sigmoidoscopy or colonoscopy. You may have a sigmoidoscopy every 5 years or a colonoscopy every 10 years starting at age 28.  Hepatitis C blood test.  Hepatitis B blood test.  Sexually transmitted disease (STD) testing.  Diabetes screening. This is done by checking your blood sugar (glucose) after you have not eaten for a while (fasting). You may have this done every 1-3 years.  Bone density scan. This is done to screen for osteoporosis. You may have this done starting at age 48.  Mammogram. This may be done every 1-2 years. Talk to your health care provider about how often you should have regular mammograms. Talk with your health care provider about your test results, treatment options, and if necessary, the need  for more tests. Vaccines  Your health care provider may recommend certain vaccines, such as:  Influenza vaccine. This is recommended every year.  Tetanus, diphtheria, and acellular pertussis (Tdap, Td) vaccine. You may need a Td booster every 10 years.  Zoster  vaccine. You may need this after age 73.  Pneumococcal 13-valent conjugate (PCV13) vaccine. One dose is recommended after age 37.  Pneumococcal polysaccharide (PPSV23) vaccine. One dose is recommended after age 52. Talk to your health care provider about which screenings and vaccines you need and how often you need them. This information is not intended to replace advice given to you by your health care provider. Make sure you discuss any questions you have with your health care provider. Document Released: 02/23/2015 Document Revised: 10/17/2015 Document Reviewed: 11/28/2014 Elsevier Interactive Patient Education  2017 [place] Prevention in the Home Falls can cause injuries. They can happen to people of all ages. There are many things you can do to make your home safe and to help prevent falls. What can I do on the outside of my home?  Regularly fix the edges of walkways and driveways and fix any cracks.  Remove anything that might make you trip as you walk through a door, such as a raised step or threshold.  Trim any bushes or trees on the path to your home.  Use bright outdoor lighting.  Clear any walking paths of anything that might make someone trip, such as rocks or tools.  Regularly check to see if handrails are loose or broken. Make sure that both sides of any steps have handrails.  Any raised decks and porches should have guardrails on the edges.  Have any leaves, snow, or ice cleared regularly.  Use sand or salt on walking paths during winter.  Clean up any spills in your garage right away. This includes oil or grease spills. What can I do in the bathroom?  Use night lights.  Install grab bars by the toilet and in the tub and shower. Do not use towel bars as grab bars.  Use non-skid mats or decals in the tub or shower.  If you need to sit down in the shower, use a plastic, non-slip stool.  Keep the floor dry. Clean up any water that spills on the  floor as soon as it happens.  Remove soap buildup in the tub or shower regularly.  Attach bath mats securely with double-sided non-slip rug tape.  Do not have throw rugs and other things on the floor that can make you trip. What can I do in the bedroom?  Use night lights.  Make sure that you have a light by your bed that is easy to reach.  Do not use any sheets or blankets that are too big for your bed. They should not hang down onto the floor.  Have a firm chair that has side arms. You can use this for support while you get dressed.  Do not have throw rugs and other things on the floor that can make you trip. What can I do in the kitchen?  Clean up any spills right away.  Avoid walking on wet floors.  Keep items that you use a lot in easy-to-reach places.  If you need to reach something above you, use a strong step stool that has a grab bar.  Keep electrical cords out of the way.  Do not use floor polish or wax that makes floors slippery. If you must use wax, use  non-skid floor wax.  Do not have throw rugs and other things on the floor that can make you trip. What can I do with my stairs?  Do not leave any items on the stairs.  Make sure that there are handrails on both sides of the stairs and use them. Fix handrails that are broken or loose. Make sure that handrails are as long as the stairways.  Check any carpeting to make sure that it is firmly attached to the stairs. Fix any carpet that is loose or worn.  Avoid having throw rugs at the top or bottom of the stairs. If you do have throw rugs, attach them to the floor with carpet tape.  Make sure that you have a light switch at the top of the stairs and the bottom of the stairs. If you do not have them, ask someone to add them for you. What else can I do to help prevent falls?  Wear shoes that:  Do not have high heels.  Have rubber bottoms.  Are comfortable and fit you well.  Are closed at the toe. Do not wear  sandals.  If you use a stepladder:  Make sure that it is fully opened. Do not climb a closed stepladder.  Make sure that both sides of the stepladder are locked into place.  Ask someone to hold it for you, if possible.  Clearly mark and make sure that you can see:  Any grab bars or handrails.  First and last steps.  Where the edge of each step is.  Use tools that help you move around (mobility aids) if they are needed. These include:  Canes.  Walkers.  Scooters.  Crutches.  Turn on the lights when you go into a dark area. Replace any light bulbs as soon as they burn out.  Set up your furniture so you have a clear path. Avoid moving your furniture around.  If any of your floors are uneven, fix them.  If there are any pets around you, be aware of where they are.  Review your medicines with your doctor. Some medicines can make you feel dizzy. This can increase your chance of falling. Ask your doctor what other things that you can do to help prevent falls. This information is not intended to replace advice given to you by your health care provider. Make sure you discuss any questions you have with your health care provider. Document Released: 11/23/2008 Document Revised: 07/05/2015 Document Reviewed: 03/03/2014 Elsevier Interactive Patient Education  2017 [employer].

## 2019-04-18 ENCOUNTER — Encounter: Payer: Self-pay | Admitting: Physician Assistant

## 2019-04-18 ENCOUNTER — Ambulatory Visit (INDEPENDENT_AMBULATORY_CARE_PROVIDER_SITE_OTHER): Payer: Medicare Other | Admitting: Physician Assistant

## 2019-04-18 ENCOUNTER — Other Ambulatory Visit: Payer: Self-pay

## 2019-04-18 VITALS — BP 150/81 | HR 66 | Temp 96.9°F | Resp 16 | Ht 66.0 in | Wt 197.6 lb

## 2019-04-18 DIAGNOSIS — E782 Mixed hyperlipidemia: Secondary | ICD-10-CM | POA: Diagnosis not present

## 2019-04-18 DIAGNOSIS — Z1159 Encounter for screening for other viral diseases: Secondary | ICD-10-CM

## 2019-04-18 DIAGNOSIS — I10 Essential (primary) hypertension: Secondary | ICD-10-CM | POA: Diagnosis not present

## 2019-04-18 DIAGNOSIS — R87810 Cervical high risk human papillomavirus (HPV) DNA test positive: Secondary | ICD-10-CM | POA: Diagnosis not present

## 2019-04-18 DIAGNOSIS — Z1239 Encounter for other screening for malignant neoplasm of breast: Secondary | ICD-10-CM

## 2019-04-18 DIAGNOSIS — F339 Major depressive disorder, recurrent, unspecified: Secondary | ICD-10-CM | POA: Diagnosis not present

## 2019-04-18 DIAGNOSIS — E119 Type 2 diabetes mellitus without complications: Secondary | ICD-10-CM

## 2019-04-18 LAB — POCT UA - MICROALBUMIN: Microalbumin Ur, POC: 20 mg/L

## 2019-04-18 NOTE — Progress Notes (Signed)
Patient: Melissa Arroyo Female    DOB: 09-Dec-1950   69 y.o.   MRN: [id number] Visit Date: 04/18/2019  Today's Provider: Mar Daring, PA-C   Chief Complaint  Patient presents with  . Follow-up    T2DM   Subjective:    Patient had her AWV with [facility] on 04/14/2019  HPI   Diabetes Mellitus Type II, Follow-up:   Lab Results  Component Value Date   HGBA1C 7.5 (A) 12/28/2018   HGBA1C 7.2 (H) 03/15/2018   HGBA1C 7.2 (A) 11/05/2017    Last seen for diabetes 3 months ago.  Management since then includes Continue Metformin 1000mg  BID and glipizide XR 10mg  daily. Add Jardiance 10mg  as below. She reports excellent compliance with treatment. She is not having side effects.  Current symptoms include none and have been stable. Home blood sugar records: not checking    Current insulin regiment: Is not on insulin Most Recent Eye Exam: 11/2018 Weight trend: stable Current exercise: walking Current diet habits: in general, a "healthy" diet  , high salt  Pertinent Labs:    Component Value Date/Time   CHOL 216 (H) 03/30/2017 1010   TRIG 121 03/30/2017 1010   HDL 93 03/30/2017 1010   LDLCALC 99 03/30/2017 1010   LDLCALC 105 (H) 11/21/2016 1202   CREATININE 0.80 04/04/2019 1342   CREATININE 0.76 11/21/2016 1202    Wt Readings from Last 3 Encounters:  04/18/19 197 lb 9.6 oz (89.6 kg)  12/28/18 195 lb (88.5 kg)  10/01/18 186 lb 15.2 oz (84.8 kg)   ------------------------------------------------------------------------   Allergies  Allergen Reactions  . Penicillins Rash  . Vesicare [Solifenacin] Anaphylaxis    Throat Closed     Current Outpatient Medications:  .  b complex vitamins capsule, Take 1 capsule by mouth daily., Disp: , Rfl:  .  Cholecalciferol (VITAMIN D3) 10 MCG (400 UNIT) tablet, Take by mouth daily. Dose unknown, Disp: , Rfl:  .  dicyclomine (BENTYL) 10 MG capsule, TAKE 1 CAPSULE(10 MG) BY MOUTH FOUR TIMES DAILY BEFORE MEALS AND AT BEDTIME,  Disp: 120 capsule, Rfl: 5 .  estradiol (ESTRACE) 0.1 MG/GM vaginal cream, Place 1 Applicatorful vaginally at bedtime., Disp: 42.5 g, Rfl: 12 .  FLUoxetine (PROZAC) 40 MG capsule, TK 1 C PO D, Disp: , Rfl: 0 .  glipiZIDE (GLUCOTROL XL) 10 MG 24 hr tablet, TAKE 1 TABLET(10 MG) BY MOUTH DAILY WITH BREAKFAST, Disp: 90 tablet, Rfl: 3 .  Glucosamine-Chondroitin (MOVE FREE PO), Take 1 tablet by mouth daily., Disp: , Rfl:  .  ibuprofen (ADVIL,MOTRIN) 200 MG tablet, Take 400 mg by mouth at bedtime. As needed, Disp: , Rfl:  .  lovastatin (MEVACOR) 40 MG tablet, TAKE 1 TABLET(40 MG) BY MOUTH AT BEDTIME, Disp: 90 tablet, Rfl: 3 .  metFORMIN (GLUCOPHAGE) 1000 MG tablet, TAKE 1 TABLET(1000 MG) BY MOUTH TWICE DAILY WITH A MEAL, Disp: 180 tablet, Rfl: 3 .  Multiple Vitamins-Minerals (CENTRUM SILVER PO), Take 1 tablet by mouth daily., Disp: , Rfl:  .  OLANZapine (ZYPREXA) 5 MG tablet, TK 1 T PO D, Disp: , Rfl: 1 .  pioglitazone (ACTOS) 30 MG tablet, TAKE 1 TABLET(30 MG) BY MOUTH DAILY, Disp: 90 tablet, Rfl: 0 .  propranolol (INDERAL) 10 MG tablet, TAKE 1 TABLET(10 MG) BY MOUTH TWICE DAILY, Disp: 180 tablet, Rfl: 3 .  zinc gluconate 50 MG tablet, Take by mouth daily. Dose unknown, Disp: , Rfl:  .  Alcohol Swabs (ALCOHOL PREP) PADS, Use 1  pad prior to checking blood sugar to clean skin (Patient not taking: Reported on 12/23/2017), Disp: 100 each, Rfl: 3  Review of Systems  Constitutional: Negative.  Negative for appetite change, chills, fatigue and fever.  HENT: Negative.   Eyes: Negative.   Respiratory: Negative.  Negative for chest tightness and shortness of breath.   Cardiovascular: Negative.  Negative for chest pain and palpitations.  Gastrointestinal: Negative for abdominal pain, nausea and vomiting.  Endocrine: Negative.   Genitourinary: Negative.   Musculoskeletal: Negative.   Skin: Negative.   Allergic/Immunologic: Negative.   Neurological: Negative.  Negative for dizziness and weakness.    Hematological: Negative.   Psychiatric/Behavioral: Negative.     Social History   Tobacco Use  . Smoking status: Never Smoker  . Smokeless tobacco: Never Used  Substance Use Topics  . Alcohol use: No    Alcohol/week: 0.0 standard drinks    Comment: Quit in 04/24/1993      Objective:   BP (!) 150/81 (BP Location: Left Arm, Patient Position: Sitting, Cuff Size: Large)   Pulse 66   Temp (!) 96.9 F (36.1 C) (Temporal)   Resp 16   Ht 5\' 6"  (1.676 m)   Wt 197 lb 9.6 oz (89.6 kg)   BMI 31.89 kg/m  Vitals:   04/18/19 0857  BP: (!) 150/81  Pulse: 66  Resp: 16  Temp: (!) 96.9 F (36.1 C)  TempSrc: Temporal  Weight: 197 lb 9.6 oz (89.6 kg)  Height: 5\' 6"  (1.676 m)  Body mass index is 31.89 kg/m.   Physical Exam Vitals reviewed.  Constitutional:      General: She is not in acute distress.    Appearance: Normal appearance. She is well-developed. She is obese. She is not ill-appearing or diaphoretic.  HENT:     Head: Normocephalic and atraumatic.     Right Ear: Tympanic membrane, ear canal and external ear normal.     Left Ear: Tympanic membrane, ear canal and external ear normal.  Eyes:     General: No scleral icterus.       Right eye: No discharge.        Left eye: No discharge.     Extraocular Movements: Extraocular movements intact.     Conjunctiva/sclera: Conjunctivae normal.     Pupils: Pupils are equal, round, and reactive to light.  Neck:     Thyroid: No thyromegaly.     Vascular: No carotid bruit or JVD.     Trachea: No tracheal deviation.  Cardiovascular:     Rate and Rhythm: Normal rate and regular rhythm.     Pulses: Normal pulses.     Heart sounds: Normal heart sounds. No murmur. No friction rub. No gallop.   Pulmonary:     Effort: Pulmonary effort is normal. No respiratory distress.     Breath sounds: Normal breath sounds. No wheezing or rales.  Chest:     Chest wall: No tenderness.  Abdominal:     General: Abdomen is flat. Bowel sounds are  normal. There is no distension.     Palpations: Abdomen is soft. There is no mass.     Tenderness: There is no abdominal tenderness. There is no guarding or rebound.  Musculoskeletal:        General: No tenderness. Normal range of motion.     Cervical back: Normal range of motion and neck supple.     Right lower leg: No edema.     Left lower leg: No edema.  Lymphadenopathy:  Cervical: No cervical adenopathy.  Skin:    General: Skin is warm and dry.     Capillary Refill: Capillary refill takes less than 2 seconds.     Findings: No rash.  Neurological:     General: No focal deficit present.     Mental Status: She is alert and oriented to person, place, and time. Mental status is at baseline.  Psychiatric:        Mood and Affect: Mood normal.        Behavior: Behavior normal.        Thought Content: Thought content normal.        Judgment: Judgment normal.      Results for orders placed or performed in visit on 04/18/19  POCT UA - Microalbumin  Result Value Ref Range   Microalbumin Ur, POC 20 mg/L       Assessment & Plan    1. Benign essential HTN Elevated but patient without medication this morning. Reports home readings are normal. Continue Propranolol 10mg  twice daily. Will check labs as below and f/u pending results. - CBC with Differential/Platelet - Basic Metabolic Panel (BMET) - Hemoglobin A1c - Lipid panel  2. Type 2 diabetes mellitus without complication, without long-term current use of insulin (HCC) Stable. Microalbumin normal. Continue Glipizide XL 10mg  daily, Pioglitazone 30mg  daily. Will check labs as below and f/u pending results. - CBC with Differential/Platelet - Basic Metabolic Panel (BMET) - Hemoglobin A1c - Lipid panel - POCT UA - Microalbumin  3. Combined fat and carbohydrate induced hyperlipemia Stable. Conitnue lovastatin 40mg . Will check labs as below and f/u pending results. - CBC with Differential/Platelet - Basic Metabolic Panel  (BMET) - Hemoglobin A1c - Lipid panel  4. Depression, recurrent ([place]) Stable. Followed by Psychiatry. Continue floxetine 40mg , zyprexa 5mg .  - CBC with Differential/Platelet - Basic Metabolic Panel (BMET)  5. Encounter for hepatitis C screening test for low risk patient Will check labs as below and f/u pending results. - Hepatitis c antibody (reflex)  6. Encounter for breast cancer screening using non-mammogram modality There is no family history of breast cancer. She does perform regular self breast exams. Mammogram was ordered as below. Information for [facility] clinic was given to patient so she may schedule her mammogram at her convenience. - MM 3D SCREEN BREAST BILATERAL; Future  7. Cervical high risk HPV (human papillomavirus) test positive Noted on pap in 2019 with neg 16, 18. Had no cellular changes noted. Will repeat in 2022.      Mar Daring, PA-C  [facility]

## 2019-04-18 NOTE — Patient Instructions (Signed)
[email or web address]  [facility] [place],  [place]  [address] Get Driving Directions Main: [phone]   Health Maintenance After Age 68 After age 59, you are at a higher risk for certain long-term diseases and infections as well as injuries from falls. Falls are a major cause of broken bones and head injuries in people who are older than age 51. Getting regular preventive care can help to keep you healthy and well. Preventive care includes getting regular testing and making lifestyle changes as recommended by your health care provider. Talk with your health care provider about:  Which screenings and tests you should have. A screening is a test that checks for a disease when you have no symptoms.  A diet and exercise plan that is right for you. What should I know about screenings and tests to prevent falls? Screening and testing are the best ways to find a health problem early. Early diagnosis and treatment give you the best chance of managing medical conditions that are common after age 13. Certain conditions and lifestyle choices may make you more likely to have a fall. Your health care provider may recommend:  Regular vision checks. Poor vision and conditions such as cataracts can make you more likely to have a fall. If you wear glasses, make sure to get your prescription updated if your vision changes.  Medicine review. Work with your health care provider to regularly review all of the medicines you are taking, including over-the-counter medicines. Ask your health care provider about any side effects that may make you more likely to have a fall. Tell your health care provider if any medicines that you take make you feel dizzy or sleepy.  Osteoporosis screening. Osteoporosis is a condition that causes the bones to get weaker. This can make the bones weak and cause them to break more easily.  Blood pressure screening. Blood pressure  changes and medicines to control blood pressure can make you feel dizzy.  Strength and balance checks. Your health care provider may recommend certain tests to check your strength and balance while standing, walking, or changing positions.  Foot health exam. Foot pain and numbness, as well as not wearing proper footwear, can make you more likely to have a fall.  Depression screening. You may be more likely to have a fall if you have a fear of falling, feel emotionally low, or feel unable to do activities that you used to do.  Alcohol use screening. Using too much alcohol can affect your balance and may make you more likely to have a fall. What actions can I take to lower my risk of falls? General instructions  Talk with your health care provider about your risks for falling. Tell your health care provider if: ? You fall. Be sure to tell your health care provider about all falls, even ones that seem minor. ? You feel dizzy, sleepy, or off-balance.  Take over-the-counter and prescription medicines only as told by your health care provider. These include any supplements.  Eat a healthy diet and maintain a healthy weight. A healthy diet includes low-fat dairy products, low-fat (lean) meats, and fiber from whole grains, beans, and lots of fruits and vegetables. Home safety  Remove any tripping hazards, such as rugs, cords, and clutter.  Install safety equipment such as grab bars in bathrooms and safety rails on stairs.  Keep rooms and walkways well-lit. Activity   Follow a regular exercise program to stay fit. This will  help you maintain your balance. Ask your health care provider what types of exercise are appropriate for you.  If you need a cane or walker, use it as recommended by your health care provider.  Wear supportive shoes that have nonskid soles. Lifestyle  Do not drink alcohol if your health care provider tells you not to drink.  If you drink alcohol, limit how much you  have: ? 0-1 drink a day for women. ? 0-2 drinks a day for men.  Be aware of how much alcohol is in your drink. In the [place] one drink equals one typical bottle of beer (12 oz), one-half glass of wine (5 oz), or one shot of hard liquor (1 oz).  Do not use any products that contain nicotine or tobacco, such as cigarettes and e-cigarettes. If you need help quitting, ask your health care provider. Summary  Having a healthy lifestyle and getting preventive care can help to protect your health and wellness after age 42.  Screening and testing are the best way to find a health problem early and help you avoid having a fall. Early diagnosis and treatment give you the best chance for managing medical conditions that are more common for people who are older than age 32.  Falls are a major cause of broken bones and head injuries in people who are older than age 87. Take precautions to prevent a fall at home.  Work with your health care provider to learn what changes you can make to improve your health and wellness and to prevent falls. This information is not intended to replace advice given to you by your health care provider. Make sure you discuss any questions you have with your health care provider. Document Revised: 05/20/2018 Document Reviewed: 12/10/2016 Elsevier Patient Education  2020 [employer].

## 2019-04-19 LAB — CBC WITH DIFFERENTIAL/PLATELET
Basophils Absolute: 0.1 10*3/uL (ref 0.0–0.2)
Basos: 1 %
EOS (ABSOLUTE): 0.3 10*3/uL (ref 0.0–0.4)
Eos: 5 %
Hematocrit: 37.6 % (ref 34.0–46.6)
Hemoglobin: 12.6 g/dL (ref 11.1–15.9)
Immature Grans (Abs): 0 10*3/uL (ref 0.0–0.1)
Immature Granulocytes: 0 %
Lymphocytes Absolute: 2.5 10*3/uL (ref 0.7–3.1)
Lymphs: 37 %
MCH: 31.5 pg (ref 26.6–33.0)
MCHC: 33.5 g/dL (ref 31.5–35.7)
MCV: 94 fL (ref 79–97)
Monocytes Absolute: 0.4 10*3/uL (ref 0.1–0.9)
Monocytes: 5 %
Neutrophils Absolute: 3.5 10*3/uL (ref 1.4–7.0)
Neutrophils: 52 %
Platelets: 285 10*3/uL (ref 150–450)
RBC: 4 x10E6/uL (ref 3.77–5.28)
RDW: 13 % (ref 11.7–15.4)
WBC: 6.7 10*3/uL (ref 3.4–10.8)

## 2019-04-19 LAB — BASIC METABOLIC PANEL
BUN/Creatinine Ratio: 26 (ref 12–28)
BUN: 21 mg/dL (ref 8–27)
CO2: 26 mmol/L (ref 20–29)
Calcium: 9.9 mg/dL (ref 8.7–10.3)
Chloride: 100 mmol/L (ref 96–106)
Creatinine, Ser: 0.82 mg/dL (ref 0.57–1.00)
GFR calc Af Amer: 86 mL/min/{1.73_m2} (ref >59)
GFR calc non Af Amer: 74 mL/min/{1.73_m2} (ref >59)
Glucose: 154 mg/dL — ABNORMAL HIGH (ref 65–99)
Potassium: 4.7 mmol/L (ref 3.5–5.2)
Sodium: 140 mmol/L (ref 134–144)

## 2019-04-19 LAB — LIPID PANEL
Chol/HDL Ratio: 2.3 ratio (ref 0.0–4.4)
Cholesterol, Total: 183 mg/dL (ref 100–199)
HDL: 79 mg/dL (ref >39)
LDL Chol Calc (NIH): 87 mg/dL (ref 0–99)
Triglycerides: 98 mg/dL (ref 0–149)
VLDL Cholesterol Cal: 17 mg/dL (ref 5–40)

## 2019-04-19 LAB — HEPATITIS C ANTIBODY (REFLEX): HCV Ab: <0.1 s/co ratio (ref 0.0–0.9)

## 2019-04-19 LAB — HCV COMMENT:

## 2019-04-19 LAB — HEMOGLOBIN A1C
Est. average glucose Bld gHb Est-mCnc: 137 mg/dL
Hgb A1c MFr Bld: 6.4 % — ABNORMAL HIGH (ref 4.8–5.6)

## 2019-04-20 ENCOUNTER — Telehealth: Payer: Self-pay

## 2019-04-20 NOTE — Telephone Encounter (Signed)
-----   Message from Mar Daring, [place] sent at 04/19/2019  6:22 PM EST ----- Blood count is normal. Kidney function is normal. Sodium, potassium and calcium are normal. A1c is much improved at 6.4 from 7.5. Keep up the good work. Cholesterol is normal. Hep C screen is negative.

## 2019-04-20 NOTE — Telephone Encounter (Signed)
[employer]   Result Notes and Comments to Patient Comment seen by patient [name] on 04/19/2019 7:45 PM EST

## 2019-05-09 ENCOUNTER — Ambulatory Visit
Admission: RE | Admit: 2019-05-09 | Discharge: 2019-05-09 | Disposition: A | Discharge status: Home / Self Care | Payer: Medicare Other | Source: Ambulatory Visit | Attending: Physician Assistant | Admitting: Physician Assistant

## 2019-05-09 ENCOUNTER — Telehealth: Payer: Self-pay

## 2019-05-09 DIAGNOSIS — F3341 Major depressive disorder, recurrent, in partial remission: Secondary | ICD-10-CM | POA: Diagnosis not present

## 2019-05-09 DIAGNOSIS — F4011 Social phobia, generalized: Secondary | ICD-10-CM | POA: Diagnosis not present

## 2019-05-09 DIAGNOSIS — Z1231 Encounter for screening mammogram for malignant neoplasm of breast: Secondary | ICD-10-CM | POA: Insufficient documentation

## 2019-05-09 DIAGNOSIS — F900 Attention-deficit hyperactivity disorder, predominantly inattentive type: Secondary | ICD-10-CM | POA: Diagnosis not present

## 2019-05-09 DIAGNOSIS — F410 Panic disorder [episodic paroxysmal anxiety] without agoraphobia: Secondary | ICD-10-CM | POA: Diagnosis not present

## 2019-05-09 DIAGNOSIS — Z1239 Encounter for other screening for malignant neoplasm of breast: Secondary | ICD-10-CM

## 2019-05-09 NOTE — Telephone Encounter (Signed)
-----   Message from Mar Daring, [place] sent at 05/09/2019  3:29 PM EDT ----- Normal mammogram. Repeat screening in one year.

## 2019-05-09 NOTE — Telephone Encounter (Signed)
Normal mammogram. Repeat screening in one year.  Written by Mar Daring, PA-C on 05/09/2019 3:29 PM EDT Seen by patient [name] on 05/09/2019 3:33 PM EDT

## 2019-05-18 DIAGNOSIS — Z1211 Encounter for screening for malignant neoplasm of colon: Secondary | ICD-10-CM | POA: Diagnosis not present

## 2019-05-23 LAB — COLOGUARD
COLOGUARD: NEGATIVE
Cologuard: NEGATIVE

## 2019-05-30 DIAGNOSIS — F410 Panic disorder [episodic paroxysmal anxiety] without agoraphobia: Secondary | ICD-10-CM | POA: Diagnosis not present

## 2019-05-30 DIAGNOSIS — F4011 Social phobia, generalized: Secondary | ICD-10-CM | POA: Diagnosis not present

## 2019-05-30 DIAGNOSIS — F3341 Major depressive disorder, recurrent, in partial remission: Secondary | ICD-10-CM | POA: Diagnosis not present

## 2019-05-30 DIAGNOSIS — F900 Attention-deficit hyperactivity disorder, predominantly inattentive type: Secondary | ICD-10-CM | POA: Diagnosis not present

## 2019-06-02 ENCOUNTER — Encounter: Payer: Self-pay | Admitting: Physician Assistant

## 2019-06-02 ENCOUNTER — Ambulatory Visit (INDEPENDENT_AMBULATORY_CARE_PROVIDER_SITE_OTHER): Payer: Medicare Other | Admitting: Physician Assistant

## 2019-06-02 ENCOUNTER — Other Ambulatory Visit: Payer: Self-pay

## 2019-06-02 VITALS — BP 110/80 | HR 76 | Temp 96.6°F | Resp 16 | Wt 203.6 lb

## 2019-06-02 DIAGNOSIS — F4001 Agoraphobia with panic disorder: Secondary | ICD-10-CM | POA: Diagnosis not present

## 2019-06-02 NOTE — Progress Notes (Signed)
Established patient visit    Patient: Melissa Arroyo   DOB: 1951/01/15   69 y.o. Female  MRN: [id number] Visit Date: 06/02/2019  Today's healthcare provider: Mar Daring, PA-C   Chief Complaint  Patient presents with  . Advice Only   Subjective    HPI  Patient presents in office today to discuss letter needed for employer. Patient states that her office is now open back to the public and she states that she would need a letter from her PCP stating that she is exempt from wearing face shield, patient states that she does wear face mask. Patient states that she is claustrophobic and states that when she wears a face shield she begins to have severe symptoms of anxiety and panic. She has had both covid 19 vaccines.   Patient Active Problem List   Diagnosis Date Noted  . Cervical high risk HPV (human papillomavirus) test positive 04/18/2019  . Depression, recurrent ([place]) 05/26/2016  . Benign essential HTN 02/14/2015  . Dysuria 12/24/2014  . Dyspareunia in female 09/28/2014  . Atrophic vaginitis 09/28/2014  . Urge incontinence 09/28/2014  . Renal cyst, left 07/24/2014  . Microscopic hematuria 07/24/2014  . Headache, tension-type 07/24/2014  . Combined fat and carbohydrate induced hyperlipemia 07/24/2014  . Diabetes mellitus, type 2 ([place]) 07/24/2014   Past Medical History:  Diagnosis Date  . Anxiety   . Depression   . Diabetes mellitus ([place])   . Heart attack ([place]) 04/2007  . Heart attack ([place])   . Hyperlipidemia   . Hypertension   . Obesity   . Recurrent UTI   . Renal lesion   . Urinary incontinence in female   . Vaginal atrophy        Medications: Outpatient Medications Prior to Visit  Medication Sig  . Alcohol Swabs (ALCOHOL PREP) PADS Use 1 pad prior to checking blood sugar to clean skin (Patient not taking: Reported on 12/23/2017)  . b complex vitamins capsule Take 1 capsule by mouth daily.  Marland Kitchen buPROPion (WELLBUTRIN XL) 150 MG 24 hr tablet Take 150 mg  by mouth every morning.  . Cholecalciferol (VITAMIN D3) 10 MCG (400 UNIT) tablet Take by mouth daily. Dose unknown  . dicyclomine (BENTYL) 10 MG capsule TAKE 1 CAPSULE(10 MG) BY MOUTH FOUR TIMES DAILY BEFORE MEALS AND AT BEDTIME  . estradiol (ESTRACE) 0.1 MG/GM vaginal cream Place 1 Applicatorful vaginally at bedtime.  Marland Kitchen FLUoxetine (PROZAC) 40 MG capsule TK 1 C PO D  . glipiZIDE (GLUCOTROL XL) 10 MG 24 hr tablet TAKE 1 TABLET(10 MG) BY MOUTH DAILY WITH BREAKFAST  . Glucosamine-Chondroitin (MOVE FREE PO) Take 1 tablet by mouth daily.  Marland Kitchen ibuprofen (ADVIL,MOTRIN) 200 MG tablet Take 400 mg by mouth at bedtime. As needed  . lovastatin (MEVACOR) 40 MG tablet TAKE 1 TABLET(40 MG) BY MOUTH AT BEDTIME  . metFORMIN (GLUCOPHAGE) 1000 MG tablet TAKE 1 TABLET(1000 MG) BY MOUTH TWICE DAILY WITH A MEAL  . Multiple Vitamins-Minerals (CENTRUM SILVER PO) Take 1 tablet by mouth daily.  Marland Kitchen OLANZapine (ZYPREXA) 2.5 MG tablet Take 2.5 mg by mouth daily as needed.  . pioglitazone (ACTOS) 30 MG tablet TAKE 1 TABLET(30 MG) BY MOUTH DAILY  . propranolol (INDERAL) 10 MG tablet TAKE 1 TABLET(10 MG) BY MOUTH TWICE DAILY  . zinc gluconate 50 MG tablet Take by mouth daily. Dose unknown  . [DISCONTINUED] OLANZapine (ZYPREXA) 5 MG tablet TK 1 T PO D   No facility-administered medications prior to visit.  Review of Systems  Constitutional: Negative.   HENT: Negative.   Eyes: Negative.   Respiratory: Negative.   Cardiovascular: Negative.   Psychiatric/Behavioral: Positive for agitation, confusion and decreased concentration. The patient is nervous/anxious.     Last CBC Lab Results  Component Value Date   WBC 6.7 04/18/2019   HGB 12.6 04/18/2019   HCT 37.6 04/18/2019   MCV 94 04/18/2019   MCH 31.5 04/18/2019   RDW 13.0 04/18/2019   PLT 285 [id number]   Last metabolic panel Lab Results  Component Value Date   GLUCOSE 154 (H) 04/18/2019   NA 140 04/18/2019   K 4.7 04/18/2019   CL 100 04/18/2019   CO2 26  04/18/2019   BUN 21 04/18/2019   CREATININE 0.82 04/18/2019   GFRNONAA 74 04/18/2019   GFRAA 86 04/18/2019   CALCIUM 9.9 04/18/2019   PROT 7.4 07/31/2018   ALBUMIN 4.1 07/31/2018   LABGLOB 2.8 10/19/2017   AGRATIO 1.5 10/19/2017   BILITOT 0.5 07/31/2018   ALKPHOS 85 07/31/2018   AST 40 07/31/2018   ALT 39 07/31/2018   ANIONGAP 10 07/31/2018   Last hemoglobin A1c Lab Results  Component Value Date   HGBA1C 6.4 (H) 04/18/2019   Last thyroid functions Lab Results  Component Value Date   TSH 4.030 03/30/2017       Objective    BP 110/80   Pulse 76   Temp (!) 96.6 F (35.9 C) (Oral)   Resp 16   Wt 203 lb 9.6 oz (92.4 kg)   SpO2 96%   BMI 32.86 kg/m  BP Readings from Last 3 Encounters:  06/02/19 110/80  04/18/19 (!) 150/81  12/28/18 122/75   Wt Readings from Last 3 Encounters:  06/02/19 203 lb 9.6 oz (92.4 kg)  04/18/19 197 lb 9.6 oz (89.6 kg)  12/28/18 195 lb (88.5 kg)      Physical Exam Vitals reviewed.  Constitutional:      General: She is not in acute distress.    Appearance: Normal appearance. She is well-developed. She is obese. She is not ill-appearing.  HENT:     Head: Normocephalic and atraumatic.  Pulmonary:     Effort: Pulmonary effort is normal. No respiratory distress.  Musculoskeletal:     Cervical back: Normal range of motion and neck supple.  Neurological:     Mental Status: She is alert.  Psychiatric:        Attention and Perception: Attention and perception normal.        Mood and Affect: Mood is anxious.        Speech: Speech normal.        Behavior: Behavior normal. Behavior is cooperative.        Thought Content: Thought content normal.        Judgment: Judgment normal.       No results found for any visits on 06/02/19.   Assessment & Plan     1. Panic disorder with agoraphobia and mild panic attacks Note provided for patient to avoid face shield, but continue wearing face mask. Patient wears eye glasses so she does have  some eye protection. Continue medications and follow ups with psychiatry and counseling.    No follow-ups on file.      Reynolds Bowl, PA-C, have reviewed all documentation for this visit. The documentation on 06/02/19 for the exam, diagnosis, procedures, and orders are all accurate and complete.   [place]ye [place]  [facility] [phone] (phone) [phone] (  fax)  [place]

## 2019-06-08 ENCOUNTER — Other Ambulatory Visit: Payer: Self-pay | Admitting: Physician Assistant

## 2019-06-08 DIAGNOSIS — E7800 Pure hypercholesterolemia, unspecified: Secondary | ICD-10-CM

## 2019-06-08 DIAGNOSIS — I10 Essential (primary) hypertension: Secondary | ICD-10-CM
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
## 2019-06-27 DIAGNOSIS — F4011 Social phobia, generalized: Secondary | ICD-10-CM | POA: Diagnosis not present

## 2019-06-27 DIAGNOSIS — F900 Attention-deficit hyperactivity disorder, predominantly inattentive type: Secondary | ICD-10-CM | POA: Diagnosis not present

## 2019-06-27 DIAGNOSIS — F410 Panic disorder [episodic paroxysmal anxiety] without agoraphobia: Secondary | ICD-10-CM | POA: Diagnosis not present

## 2019-06-27 DIAGNOSIS — F3341 Major depressive disorder, recurrent, in partial remission: Secondary | ICD-10-CM | POA: Diagnosis not present

## 2019-07-13 ENCOUNTER — Other Ambulatory Visit: Payer: Self-pay | Admitting: Physician Assistant

## 2019-07-13 DIAGNOSIS — E1129 Type 2 diabetes mellitus with other diabetic kidney complication: Secondary | ICD-10-CM

## 2019-07-25 DIAGNOSIS — F4011 Social phobia, generalized: Secondary | ICD-10-CM | POA: Diagnosis not present

## 2019-07-25 DIAGNOSIS — F3341 Major depressive disorder, recurrent, in partial remission: Secondary | ICD-10-CM | POA: Diagnosis not present

## 2019-07-25 DIAGNOSIS — F410 Panic disorder [episodic paroxysmal anxiety] without agoraphobia: Secondary | ICD-10-CM | POA: Diagnosis not present

## 2019-07-25 DIAGNOSIS — F900 Attention-deficit hyperactivity disorder, predominantly inattentive type: Secondary | ICD-10-CM | POA: Diagnosis not present

## 2019-08-08 ENCOUNTER — Ambulatory Visit (INDEPENDENT_AMBULATORY_CARE_PROVIDER_SITE_OTHER): Payer: Medicare Other | Admitting: Podiatry

## 2019-08-08 ENCOUNTER — Encounter: Payer: Self-pay | Admitting: Podiatry

## 2019-08-08 ENCOUNTER — Other Ambulatory Visit: Payer: Self-pay

## 2019-08-08 DIAGNOSIS — M79675 Pain in left toe(s): Secondary | ICD-10-CM

## 2019-08-08 DIAGNOSIS — B351 Tinea unguium: Secondary | ICD-10-CM

## 2019-08-08 DIAGNOSIS — M79674 Pain in right toe(s): Secondary | ICD-10-CM | POA: Insufficient documentation

## 2019-08-08 DIAGNOSIS — E119 Type 2 diabetes mellitus without complications: Secondary | ICD-10-CM

## 2019-08-08 LAB — HM DIABETES EYE EXAM

## 2019-08-08 NOTE — Progress Notes (Signed)
This patient returns to my office for at risk foot care.  This patient requires this care by a professional since this patient will be at risk due to having diabetes type 2.  This patient is unable to cut nails herself since the patient cannot reach her nails.These nails are painful walking and wearing shoes.  This patient presents for at risk foot care today.  General Appearance  Alert, conversant and in no acute stress.  Vascular  Dorsalis pedis and posterior tibial  pulses are palpable  bilaterally.  Capillary return is within normal limits  bilaterally. Temperature is within normal limits  bilaterally.  Neurologic  Senn-Weinstein monofilament wire test within normal limits  bilaterally. Muscle power within normal limits bilaterally.  Nails Thick disfigured discolored nails with subungual debris  from hallux to fifth toes bilaterally. No evidence of bacterial infection or drainage bilaterally.  Orthopedic  No limitations of motion  feet .  No crepitus or effusions noted.  No bony pathology or digital deformities noted.  Skin  normotropic skin with no porokeratosis noted bilaterally.  No signs of infections or ulcers noted.     Onychomycosis  Pain in right toes  Pain in left toes  Consent was obtained for treatment procedures.   Mechanical debridement of nails 1-5  bilaterally performed with a nail nipper.  Filed with dremel without incident.    Return office visit   3 months                   Told patient to return for periodic foot care and evaluation due to potential at risk complications.   Gardiner Barefoot DPM

## 2019-08-11 ENCOUNTER — Encounter: Payer: Self-pay | Admitting: Physician Assistant

## 2019-08-19 ENCOUNTER — Other Ambulatory Visit: Payer: Self-pay | Admitting: Physician Assistant

## 2019-08-19 DIAGNOSIS — E1129 Type 2 diabetes mellitus with other diabetic kidney complication: Secondary | ICD-10-CM

## 2019-08-28 ENCOUNTER — Other Ambulatory Visit: Payer: Self-pay | Admitting: Physician Assistant

## 2019-08-28 DIAGNOSIS — R197 Diarrhea, unspecified: Secondary | ICD-10-CM
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
## 2019-09-06 ENCOUNTER — Other Ambulatory Visit: Payer: Self-pay | Admitting: Physician Assistant

## 2019-09-06 DIAGNOSIS — E119 Type 2 diabetes mellitus without complications: Secondary | ICD-10-CM

## 2019-10-06 DIAGNOSIS — F4011 Social phobia, generalized: Secondary | ICD-10-CM | POA: Diagnosis not present

## 2019-10-06 DIAGNOSIS — F410 Panic disorder [episodic paroxysmal anxiety] without agoraphobia: Secondary | ICD-10-CM | POA: Diagnosis not present

## 2019-10-06 DIAGNOSIS — F900 Attention-deficit hyperactivity disorder, predominantly inattentive type: Secondary | ICD-10-CM | POA: Diagnosis not present

## 2019-10-06 DIAGNOSIS — F3341 Major depressive disorder, recurrent, in partial remission: Secondary | ICD-10-CM | POA: Diagnosis not present

## 2019-10-20 ENCOUNTER — Encounter: Payer: Self-pay | Admitting: Physician Assistant

## 2019-10-20 ENCOUNTER — Ambulatory Visit (INDEPENDENT_AMBULATORY_CARE_PROVIDER_SITE_OTHER): Payer: Medicare Other | Admitting: Physician Assistant

## 2019-10-20 ENCOUNTER — Other Ambulatory Visit: Payer: Self-pay

## 2019-10-20 VITALS — Temp 97.7°F | Resp 16 | Ht 65.0 in | Wt 208.8 lb

## 2019-10-20 DIAGNOSIS — Z23 Encounter for immunization: Secondary | ICD-10-CM | POA: Diagnosis not present

## 2019-10-20 DIAGNOSIS — T300 Burn of unspecified body region, unspecified degree: Secondary | ICD-10-CM

## 2019-10-20 DIAGNOSIS — E119 Type 2 diabetes mellitus without complications: Secondary | ICD-10-CM

## 2019-10-20 LAB — POCT GLYCOSYLATED HEMOGLOBIN (HGB A1C)
Est. average glucose Bld gHb Est-mCnc: 143
Hemoglobin A1C: 6.6 % — AB (ref 4.0–5.6)

## 2019-10-20 MED ORDER — SILVER SULFADIAZINE 1 % EX CREA: 1.0000 "application " | TOPICAL_CREAM | Freq: Every day | CUTANEOUS | 0 refills | Status: DC
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
## 2019-10-20 NOTE — Patient Instructions (Signed)
Burn Care, Adult A burn is an injury to the skin or the tissues under the skin. There are three types of burns:  First degree. These burns may cause the skin to be red and slightly swollen.  Second degree. These burns are very painful and cause the skin to be very red. The skin may also leak fluid, look shiny, and develop blisters.  Third degree. These burns cause permanent damage. They either turn the skin white or black and make it look charred, dry, and leathery. Taking care of your burn properly can help to prevent pain and infection. It can also help the burn to heal more quickly. What are the risks? Complications from burns include:  Damage to the skin.  Reduced blood flow near the injury.  Dead tissue.  Scarring.  Problems with movement, if the burn happened near a joint or on the hands or feet. Severe burns can lead to problems that affect the whole body, such as:  Fluid loss.  Less blood circulating in the body.  Inability to maintain a normal core body temperature (thermoregulation).  Infection.  Shock.  Problems breathing. How to care for a first-degree burn Right after a burn:  Rinse or soak the burn under cool water until the pain stops. Do not put ice on your burn. This can cause more damage.  Lightly cover the burn with a sterile cloth (dressing). Burn care  Follow instructions from your health care provider about: ? How to clean and take care of the burn. ? When to change and remove the dressing.  Check your burn every day for signs of infection. Check for: ? More redness, swelling, or pain. ? Warmth. ? Pus or a bad smell. Medicine  Take over-the-counter and prescription medicines only as told by your health care provider.  If you were prescribed antibiotic medicine, take or apply it as told by your health care provider. Do not stop using the antibiotic even if your condition improves. General instructions  To prevent infection, do not put  butter, oil, or other home remedies on your burn.  Do not rub your burn, even when you are cleaning it.  Protect your burn from the sun. How to care for a second-degree burn Right after a burn:  Rinse or soak the burn under cool water. Do this for several minutes. Do not put ice on your burn. This can cause more damage.  Lightly cover the burn with a sterile cloth (dressing). Burn care  Raise (elevate) the injured area above the level of your heart while sitting or lying down.  Follow instructions from your health care provider about: ? How to clean and take care of the burn. ? When to change and remove the dressing.  Check your burn every day for signs of infection. Check for: ? More redness, swelling, or pain. ? Warmth. ? Pus or a bad smell. Medicine   Take over-the-counter and prescription medicines only as told by your health care provider.  If you were prescribed antibiotic medicine, take or apply it as told by your health care provider. Do not stop using the antibiotic even if your condition improves. General instructions  To prevent infection: ? Do not put butter, oil, or other home remedies on the burn. ? Do not scratch or pick at the burn. ? Do not break any blisters. ? Do not peel skin.  Do not rub your burn, even when you are cleaning it.  Protect your burn from the sun. How  to care for a third-degree burn Right after a burn:  Lightly cover the burn with gauze.  Seek immediate medical attention. Burn care  Raise (elevate) the injured area above the level of your heart while sitting or lying down.  Drink enough fluid to keep your urine clear or pale yellow.  Rest as told by your health care provider. Do not participate in sports or other physical activities until your health care provider approves.  Follow instructions from your health care provider about: ? How to clean and take care of the burn. ? When to change and remove the dressing.  Check  your burn every day for signs of infection. Check for: ? More redness, swelling, or pain. ? Warmth. ? Pus or a bad smell. Medicine  Take over-the-counter and prescription medicines only as told by your health care provider.  If you were prescribed antibiotic medicine, take or apply it as told by your health care provider. Do not stop using the antibiotic even if your condition improves. General instructions  To prevent infection: ? Do not put butter, oil, or other home remedies on the burn. ? Do not scratch or pick at the burn. ? Do not break any blisters. ? Do not peel skin. ? Do not rub your burn, even when you are cleaning it.  Protect your burn from the sun.  Keep all follow-up visits as told by your health care provider. This is important. Contact a health care provider if:  Your condition does not improve.  Your condition gets worse.  You have a fever.  Your burn changes in appearance or develops black or red spots.  Your burn feels warm to the touch.  Your pain is not controlled with medicine. Get help right away if:  You have redness, swelling, or pain at the site of the burn.  You have fluid, blood, or pus coming from your burn.  You have red streaks near the burn.  You have severe pain. This information is not intended to replace advice given to you by your health care provider. Make sure you discuss any questions you have with your health care provider. Document Revised: 01/09/2017 Document Reviewed: 07/17/2015 Elsevier Patient Education  [place].

## 2019-11-10 ENCOUNTER — Ambulatory Visit: Payer: Medicare Other | Admitting: Podiatry

## 2019-11-11 ENCOUNTER — Other Ambulatory Visit: Payer: Self-pay | Admitting: Physician Assistant

## 2019-11-11 DIAGNOSIS — E1129 Type 2 diabetes mellitus with other diabetic kidney complication: Secondary | ICD-10-CM

## 2019-11-11 MED ORDER — PIOGLITAZONE HCL 30 MG PO TABS: ORAL_TABLET | ORAL | 1 refills | Status: DC

## 2019-11-11 NOTE — Telephone Encounter (Signed)
[facility] faxed refill request for the following medications:  pioglitazone (ACTOS) 30 MG tablet  Last Rx: 08/19/2019 LOV: 10/20/2019 Please advise. Thanks TNP

## 2019-12-05 ENCOUNTER — Other Ambulatory Visit: Payer: Self-pay | Admitting: Physician Assistant

## 2019-12-05 DIAGNOSIS — E119 Type 2 diabetes mellitus without complications: Secondary | ICD-10-CM

## 2020-01-12 DIAGNOSIS — F4011 Social phobia, generalized: Secondary | ICD-10-CM | POA: Diagnosis not present

## 2020-01-12 DIAGNOSIS — F410 Panic disorder [episodic paroxysmal anxiety] without agoraphobia: Secondary | ICD-10-CM | POA: Diagnosis not present

## 2020-01-12 DIAGNOSIS — F900 Attention-deficit hyperactivity disorder, predominantly inattentive type: Secondary | ICD-10-CM | POA: Diagnosis not present

## 2020-01-12 DIAGNOSIS — F3341 Major depressive disorder, recurrent, in partial remission: Secondary | ICD-10-CM | POA: Diagnosis not present

## 2020-03-04 ENCOUNTER — Other Ambulatory Visit: Payer: Self-pay | Admitting: Physician Assistant

## 2020-03-04 DIAGNOSIS — E119 Type 2 diabetes mellitus without complications: Secondary | ICD-10-CM

## 2020-03-04 DIAGNOSIS — I10 Essential (primary) hypertension: Secondary | ICD-10-CM

## 2020-04-02 DIAGNOSIS — F410 Panic disorder [episodic paroxysmal anxiety] without agoraphobia: Secondary | ICD-10-CM | POA: Diagnosis not present

## 2020-04-02 DIAGNOSIS — F900 Attention-deficit hyperactivity disorder, predominantly inattentive type: Secondary | ICD-10-CM | POA: Diagnosis not present

## 2020-04-02 DIAGNOSIS — F4011 Social phobia, generalized: Secondary | ICD-10-CM | POA: Diagnosis not present

## 2020-04-02 DIAGNOSIS — F3341 Major depressive disorder, recurrent, in partial remission: Secondary | ICD-10-CM | POA: Diagnosis not present

## 2020-04-30 ENCOUNTER — Encounter: Payer: Self-pay | Admitting: Physician Assistant

## 2020-05-14 ENCOUNTER — Encounter: Payer: Self-pay | Admitting: Physician Assistant

## 2020-05-14 ENCOUNTER — Other Ambulatory Visit: Payer: Self-pay

## 2020-05-14 ENCOUNTER — Ambulatory Visit (INDEPENDENT_AMBULATORY_CARE_PROVIDER_SITE_OTHER): Payer: Medicare Other | Admitting: Physician Assistant

## 2020-05-14 VITALS — BP 147/81 | HR 64 | Temp 97.5°F | Ht 65.0 in | Wt 203.0 lb

## 2020-05-14 DIAGNOSIS — B351 Tinea unguium: Secondary | ICD-10-CM | POA: Diagnosis not present

## 2020-05-14 DIAGNOSIS — R809 Proteinuria, unspecified: Secondary | ICD-10-CM

## 2020-05-14 DIAGNOSIS — R197 Diarrhea, unspecified: Secondary | ICD-10-CM

## 2020-05-14 DIAGNOSIS — Z803 Family history of malignant neoplasm of breast: Secondary | ICD-10-CM

## 2020-05-14 DIAGNOSIS — Z1239 Encounter for other screening for malignant neoplasm of breast: Secondary | ICD-10-CM

## 2020-05-14 DIAGNOSIS — M79674 Pain in right toe(s): Secondary | ICD-10-CM

## 2020-05-14 DIAGNOSIS — M79675 Pain in left toe(s): Secondary | ICD-10-CM

## 2020-05-14 DIAGNOSIS — I10 Essential (primary) hypertension: Secondary | ICD-10-CM

## 2020-05-14 DIAGNOSIS — N952 Postmenopausal atrophic vaginitis: Secondary | ICD-10-CM

## 2020-05-14 DIAGNOSIS — J301 Allergic rhinitis due to pollen: Secondary | ICD-10-CM | POA: Diagnosis not present

## 2020-05-14 DIAGNOSIS — E1129 Type 2 diabetes mellitus with other diabetic kidney complication: Secondary | ICD-10-CM

## 2020-05-14 DIAGNOSIS — Z0000 Encounter for general adult medical examination without abnormal findings: Secondary | ICD-10-CM

## 2020-05-14 DIAGNOSIS — E782 Mixed hyperlipidemia: Secondary | ICD-10-CM | POA: Diagnosis not present

## 2020-05-14 MED ORDER — LOVASTATIN 40 MG PO TABS: ORAL_TABLET | ORAL | 3 refills | Status: DC

## 2020-05-14 MED ORDER — ESTRADIOL 0.1 MG/GM VA CREA: 1.0000 | TOPICAL_CREAM | Freq: Every day | VAGINAL | 12 refills | Status: AC

## 2020-05-14 MED ORDER — FLONASE SENSIMIST 27.5 MCG/SPRAY NA SUSP: 1.0000 | Freq: Every day | NASAL | 12 refills | Status: DC

## 2020-05-14 MED ORDER — DICYCLOMINE HCL 10 MG PO CAPS: 10.0000 mg | ORAL_CAPSULE | Freq: Three times a day (TID) | ORAL | 3 refills | Status: DC

## 2020-05-14 MED ORDER — GLIPIZIDE ER 10 MG PO TB24: ORAL_TABLET | ORAL | 3 refills | Status: DC

## 2020-05-14 MED ORDER — METFORMIN HCL 1000 MG PO TABS: ORAL_TABLET | ORAL | 3 refills | Status: DC

## 2020-05-14 MED ORDER — PIOGLITAZONE HCL 30 MG PO TABS: ORAL_TABLET | ORAL | 3 refills | Status: DC

## 2020-05-14 MED ORDER — PROPRANOLOL HCL 10 MG PO TABS: ORAL_TABLET | ORAL | 3 refills | Status: DC

## 2020-05-14 NOTE — Patient Instructions (Signed)
[facility] [address] [place],  [place]  [address] Main: [phone]   Preventive Care 69 Years and Older, Female Preventive care refers to lifestyle choices and visits with your health care provider that can promote health and wellness. This includes:  A yearly physical exam. This is also called an annual wellness visit.  Regular dental and eye exams.  Immunizations.  Screening for certain conditions.  Healthy lifestyle choices, such as: ? Eating a healthy diet. ? Getting regular exercise. ? Not using drugs or products that contain nicotine and tobacco. ? Limiting alcohol use. What can I expect for my preventive care visit? Physical exam Your health care provider will check your:  Height and weight. These may be used to calculate your BMI (body mass index). BMI is a measurement that tells if you are at a healthy weight.  Heart rate and blood pressure.  Body temperature.  Skin for abnormal spots. Counseling Your health care provider may ask you questions about your:  Past medical problems.  Family's medical history.  Alcohol, tobacco, and drug use.  Emotional well-being.  Home life and relationship well-being.  Sexual activity.  Diet, exercise, and sleep habits.  History of falls.  Memory and ability to understand (cognition).  Work and work environment.  Pregnancy and menstrual history.  Access to firearms. What immunizations do I need? Vaccines are usually given at various ages, according to a schedule. Your health care provider will recommend vaccines for you based on your age, medical history, and lifestyle or other factors, such as travel or where you work.   What tests do I need? Blood tests  Lipid and cholesterol levels. These may be checked every 5 years, or more often depending on your overall health.  Hepatitis C test.  Hepatitis B test. Screening  Lung cancer screening. You may have this  screening every year starting at age 55 if you have a 30-pack-year history of smoking and currently smoke or have quit within the past 15 years.  Colorectal cancer screening. ? All adults should have this screening starting at age 50 and continuing until age 75. ? Your health care provider may recommend screening at age 45 if you are at increased risk. ? You will have tests every 1-10 years, depending on your results and the type of screening test.  Diabetes screening. ? This is done by checking your blood sugar (glucose) after you have not eaten for a while (fasting). ? You may have this done every 1-3 years.  Mammogram. ? This may be done every 1-2 years. ? Talk with your health care provider about how often you should have regular mammograms.  Abdominal aortic aneurysm (AAA) screening. You may need this if you are a current or former smoker.  BRCA-related cancer screening. This may be done if you have a family history of breast, ovarian, tubal, or peritoneal cancers. Other tests  STD (sexually transmitted disease) testing, if you are at risk.  Bone density scan. This is done to screen for osteoporosis. You may have this done starting at age 65. Talk with your health care provider about your test results, treatment options, and if necessary, the need for more tests. Follow these instructions at home: Eating and drinking  Eat a diet that includes fresh fruits and vegetables, whole grains, lean protein, and low-fat dairy products. Limit your intake of foods with high amounts of sugar, saturated fats, and salt.  Take vitamin and mineral supplements as recommended by   your health care provider.  Do not drink alcohol if your health care provider tells you not to drink.  If you drink alcohol: ? Limit how much you have to 0-1 drink a day. ? Be aware of how much alcohol is in your drink. In the [place] one drink equals one 12 oz bottle of beer (355 mL), one 5 oz glass of wine (148 mL), or  one 1 oz glass of hard liquor (44 mL).   Lifestyle  Take daily care of your teeth and gums. Brush your teeth every morning and night with fluoride toothpaste. Floss one time each day.  Stay active. Exercise for at least 30 minutes 5 or more days each week.  Do not use any products that contain nicotine or tobacco, such as cigarettes, e-cigarettes, and chewing tobacco. If you need help quitting, ask your health care provider.  Do not use drugs.  If you are sexually active, practice safe sex. Use a condom or other form of protection in order to prevent STIs (sexually transmitted infections).  Talk with your health care provider about taking a low-dose aspirin or statin.  Find healthy ways to cope with stress, such as: ? Meditation, yoga, or listening to music. ? Journaling. ? Talking to a trusted person. ? Spending time with friends and family. Safety  Always wear your seat belt while driving or riding in a vehicle.  Do not drive: ? If you have been drinking alcohol. Do not ride with someone who has been drinking. ? When you are tired or distracted. ? While texting.  Wear a helmet and other protective equipment during sports activities.  If you have firearms in your house, make sure you follow all gun safety procedures. What's next?  Visit your health care provider once a year for an annual wellness visit.  Ask your health care provider how often you should have your eyes and teeth checked.  Stay up to date on all vaccines. This information is not intended to replace advice given to you by your health care provider. Make sure you discuss any questions you have with your health care provider. Document Revised: 01/18/2020 Document Reviewed: 01/21/2018 Elsevier Patient Education  2021 Elsevier Inc.   

## 2020-05-14 NOTE — Progress Notes (Signed)
Complete physical exam   Patient: Melissa Arroyo   DOB: 02-Jul-1950   71 y.o. Female  MRN: [id number] Visit Date: 05/14/2020  Today's healthcare provider: Mar Daring, PA-C   Chief Complaint  Patient presents with  . Annual Exam   Subjective    Melissa Arroyo is a 70 y.o. female who presents today for a complete physical exam.  She reports consuming a general diet. Walks three times a week. She generally feels well. She reports sleeping well. She does have additional problems to discuss today.  HPI  Referral for toenail fungus RX for nasal spray    Past Medical History:  Diagnosis Date  . Anxiety   . Depression   . Diabetes mellitus ([place])   . Heart attack ([place]) 04/2007  . Heart attack ([place])   . Hyperlipidemia   . Hypertension   . Obesity   . Recurrent UTI   . Renal lesion   . Urinary incontinence in female   . Vaginal atrophy    Past Surgical History:  Procedure Laterality Date  . BUNIONECTOMY  2001  . CARDIAC CATHETERIZATION    . DENTAL SURGERY    . NECK SURGERY    . ROTATOR CUFF REPAIR Left    Social History   Socioeconomic History  . Marital status: Married    Spouse name: Not on file  . Number of children: 0  . Years of education: Not on file  . Highest education level: Bachelor's degree (e.g., BA, AB, BS)  Occupational History    Employer: [employer]    Comment: full time  Tobacco Use  . Smoking status: Never Smoker  . Smokeless tobacco: Never Used  Vaping Use  . Vaping Use: Never used  Substance and Sexual Activity  . Alcohol use: No    Alcohol/week: 0.0 standard drinks    Comment: Quit in 04/24/1993  . Drug use: No  . Sexual activity: Yes  Other Topics Concern  . Not on file  Social History Narrative  . Not on file   Social Determinants of Health   Financial Resource Strain: Not on file  Food Insecurity: Not on file  Transportation Needs: Not on file  Physical Activity: Not on file  Stress: Not on file  Social  Connections: Not on file  Intimate Partner Violence: Not on file   Family Status  Relation Name Status  . Mother  Deceased at age 30  . Father  Deceased at age 61  . Other NEG HX (Not Specified)  . MGM  (Not Specified)  . Brother  Associate Professor  . Sister  Alive  . Brother  Alive  . Neg Hx  (Not Specified)   Family History  Problem Relation Age of Onset  . Hypertension Mother   . Diabetes Mother   . Leukemia Mother   . Colon cancer Mother   . Rheum arthritis Father   . Lymphoma Father   . Cancer Other        Bladder,Kidney,Prostate  . Uterine cancer Maternal Grandmother   . Hypertension Brother   . Healthy Sister   . Heart disease Brother        [employer]   . Breast cancer Neg Hx    Allergies  Allergen Reactions  . Penicillins Rash  . Vesicare [Solifenacin] Anaphylaxis    Throat Closed    Patient Care Team: Rubye Beach as PCP - General (Family Medicine) Serafina Royals  J, MD as Consulting Physician (Cardiology) Sharia Reeve, MD as Referring Physician (Psychiatry) Arelia Sneddon, OD (Optometry)   Medications: Outpatient Medications Prior to Visit  Medication Sig  . Alcohol Swabs (ALCOHOL PREP) PADS Use 1 pad prior to checking blood sugar to clean skin  . b complex vitamins capsule Take 1 capsule by mouth daily.  Marland Kitchen buPROPion (WELLBUTRIN XL) 300 MG 24 hr tablet Take 300 mg by mouth every morning.  . chlorhexidine (PERIDEX) 0.12 % solution SMARTSIG:By Mouth  . Cholecalciferol (VITAMIN D3) 10 MCG (400 UNIT) tablet Take by mouth daily. Dose unknown  . FLUoxetine (PROZAC) 40 MG capsule TK 1 C PO D  . Glucosamine-Chondroitin (MOVE FREE PO) Take 1 tablet by mouth daily.  Marland Kitchen ibuprofen (ADVIL,MOTRIN) 200 MG tablet Take 400 mg by mouth at bedtime. As needed  . Multiple Vitamins-Minerals (CENTRUM SILVER PO) Take 1 tablet by mouth daily.  Marland Kitchen OLANZapine (ZYPREXA) 2.5 MG tablet Take 2.5 mg by mouth daily as needed.  . zinc gluconate 50 MG tablet  Take by mouth daily. Dose unknown  . [DISCONTINUED] dicyclomine (BENTYL) 10 MG capsule TAKE 1 CAPSULE(10 MG) BY MOUTH FOUR TIMES DAILY BEFORE MEALS AND AT BEDTIME  . [DISCONTINUED] estradiol (ESTRACE) 0.1 MG/GM vaginal cream Place 1 Applicatorful vaginally at bedtime.  . [DISCONTINUED] glipiZIDE (GLUCOTROL XL) 10 MG 24 hr tablet TAKE 1 TABLET(10 MG) BY MOUTH DAILY WITH BREAKFAST  . [DISCONTINUED] lovastatin (MEVACOR) 40 MG tablet TAKE 1 TABLET(40 MG) BY MOUTH AT BEDTIME  . [DISCONTINUED] metFORMIN (GLUCOPHAGE) 1000 MG tablet TAKE 1 TABLET(1000 MG) BY MOUTH TWICE DAILY WITH A MEAL  . [DISCONTINUED] pioglitazone (ACTOS) 30 MG tablet TAKE 1 TABLET(30 MG) BY MOUTH DAILY  . [DISCONTINUED] propranolol (INDERAL) 10 MG tablet TAKE 1 TABLET(10 MG) BY MOUTH TWICE DAILY  . [DISCONTINUED] silver sulfADIAZINE (SILVADENE) 1 % cream Apply 1 application topically daily.   No facility-administered medications prior to visit.    Review of Systems  Constitutional: Negative.   HENT: Negative.   Eyes: Negative.   Respiratory: Negative.   Cardiovascular: Negative.   Gastrointestinal: Negative.   Endocrine: Negative.   Genitourinary: Negative.   Musculoskeletal: Negative.   Skin: Negative.   Allergic/Immunologic: Negative.   Neurological: Negative.   Hematological: Negative.   Psychiatric/Behavioral: Negative.       Objective    BP (!) 147/81 (BP Location: Left Arm, Patient Position: Sitting, Cuff Size: Large)   Pulse 64   Temp (!) 97.5 F (36.4 C) (Oral)   Ht 5\' 5"  (1.651 m)   Wt 203 lb (92.1 kg)   BMI 33.78 kg/m    Physical Exam Vitals reviewed.  Constitutional:      General: She is not in acute distress.    Appearance: Normal appearance. She is well-developed. She is obese. She is not ill-appearing or diaphoretic.  HENT:     Head: Normocephalic and atraumatic.     Right Ear: Tympanic membrane, ear canal and external ear normal.     Left Ear: Tympanic membrane, ear canal and external  ear normal.     Nose: Nose normal.     Mouth/Throat:     Mouth: Mucous membranes are moist.     Pharynx: Oropharynx is clear. No oropharyngeal exudate or posterior oropharyngeal erythema.  Eyes:     General: No scleral icterus.       Right eye: No discharge.        Left eye: No discharge.     Extraocular Movements: Extraocular movements intact.  Conjunctiva/sclera: Conjunctivae normal.     Pupils: Pupils are equal, round, and reactive to light.  Neck:     Thyroid: No thyromegaly.     Vascular: No carotid bruit or JVD.     Trachea: No tracheal deviation.  Cardiovascular:     Rate and Rhythm: Normal rate and regular rhythm.     Pulses: Normal pulses.     Heart sounds: Normal heart sounds. No murmur heard. No friction rub. No gallop.   Pulmonary:     Effort: Pulmonary effort is normal. No respiratory distress.     Breath sounds: Normal breath sounds. No wheezing or rales.  Chest:     Chest wall: No tenderness.  Abdominal:     General: Abdomen is flat. Bowel sounds are normal. There is no distension.     Palpations: Abdomen is soft. There is no mass.     Tenderness: There is no abdominal tenderness. There is no guarding or rebound.  Musculoskeletal:        General: Normal range of motion.     Cervical back: Normal range of motion and neck supple. No tenderness.     Right lower leg: Edema (trace) present.     Left lower leg: Edema (trace) present.  Lymphadenopathy:     Cervical: No cervical adenopathy.  Skin:    General: Skin is warm and dry.     Capillary Refill: Capillary refill takes less than 2 seconds.     Findings: No rash.  Neurological:     General: No focal deficit present.     Mental Status: She is alert and oriented to person, place, and time. Mental status is at baseline.     Motor: No weakness.  Psychiatric:        Mood and Affect: Mood normal.        Behavior: Behavior normal.        Thought Content: Thought content normal.        Judgment: Judgment  normal.      Last depression screening scores PHQ 2/9 Scores 05/14/2020 04/14/2019 12/28/2018  PHQ - 2 Score 0 0 0  PHQ- 9 Score 0 - 0   Last fall risk screening Fall Risk  05/14/2020  Falls in the past year? 0  Comment -  Number falls in past yr: 0  Comment -  Injury with Fall? 0  Risk for fall due to : No Fall Risks  Follow up Falls evaluation completed   Last Audit-C alcohol use screening Alcohol Use Disorder Test (AUDIT) 05/14/2020  1. How often do you have a drink containing alcohol? 0  2. How many drinks containing alcohol do you have on a typical day when you are drinking? 0  3. How often do you have six or more drinks on one occasion? 0  AUDIT-C Score 0   A score of 3 or more in women, and 4 or more in men indicates increased risk for alcohol abuse, EXCEPT if all of the points are from question 1   No results found for any visits on 05/14/20.  Assessment & Plan    Routine Health Maintenance and Physical Exam  Exercise Activities and Dietary recommendations Goals    . DIET - INCREASE WATER INTAKE     Recommend to drink at least 6-8 8oz glasses of water per day.       Immunization History  Administered Date(s) Administered  . Fluad Quad(high Dose 65+) 12/28/2018, 10/20/2019  . Influenza, High Dose Seasonal PF 10/19/2017  .  Influenza,inj,Quad PF,6+ Mos 12/03/2016  . Influenza-Unspecified 12/01/2015  . Pneumococcal Conjugate-13 12/01/2015  . Pneumococcal Polysaccharide-23 03/30/2017  . Tdap 03/30/2017    Health Maintenance  Topic Date Due  . COVID-19 Vaccine (1) Never done  . FOOT EXAM  12/28/2019  . URINE MICROALBUMIN  04/17/2020  . HEMOGLOBIN A1C  04/18/2020  . OPHTHALMOLOGY EXAM  08/07/2020  . INFLUENZA VACCINE  09/10/2020  . MAMMOGRAM  05/08/2021  . Fecal DNA (Cologuard)  05/18/2022  . TETANUS/TDAP  03/31/2027  . DEXA SCAN  Completed  . Hepatitis C Screening  Completed  . PNA vac Low Risk Adult  Completed  . HPV VACCINES  Aged Out    Discussed  health benefits of physical activity, and encouraged her to engage in regular exercise appropriate for her age and condition.  1. Medicare annual wellness visit, subsequent Normal exam. Up to date on screenings and vaccinations.   2. Encounter for breast cancer screening using non-mammogram modality There is family history of breast cancer. She does perform regular self breast exams. Mammogram was ordered as below. Information for [facility] Breast clinic was given to patient so she may schedule her mammogram at her convenience. - MM 3D SCREEN BREAST BILATERAL; Future  3. Family history of breast cancer Great aunt and grandmother had abnormal mammograms requiring lumpectomies, unknown if any were cancerous. - MM 3D SCREEN BREAST BILATERAL; Future  4. Benign essential HTN Stable. Diagnosis pulled for medication refill. Continue current medical treatment plan.Will check labs as below and f/u pending results. - CBC w/Diff/Platelet - Comprehensive Metabolic Panel (CMET) - Lipid Panel With LDL/HDL Ratio - HgB A1c - propranolol (INDERAL) 10 MG tablet; TAKE 1 TABLET(10 MG) BY MOUTH TWICE DAILY  Dispense: 180 tablet; Refill: 3  5. Type 2 diabetes mellitus with microalbuminuria, without long-term current use of insulin (HCC) Stable. Diagnosis pulled for medication refill. Continue current medical treatment plan. On statin. Will check labs as below and f/u pending results. - CBC w/Diff/Platelet - Comprehensive Metabolic Panel (CMET) - Lipid Panel With LDL/HDL Ratio - HgB A1c - glipiZIDE (GLUCOTROL XL) 10 MG 24 hr tablet; TAKE 1 TABLET(10 MG) BY MOUTH DAILY WITH BREAKFAST  Dispense: 90 tablet; Refill: 3 - metFORMIN (GLUCOPHAGE) 1000 MG tablet; TAKE 1 TABLET(1000 MG) BY MOUTH TWICE DAILY WITH A MEAL  Dispense: 180 tablet; Refill: 3 - pioglitazone (ACTOS) 30 MG tablet; TAKE 1 TABLET(30 MG) BY MOUTH DAILY  Dispense: 90 tablet; Refill: 3  6. Combined fat and carbohydrate induced hyperlipemia Stable.  Diagnosis pulled for medication refill. Continue current medical treatment plan. Will check labs as below and f/u pending results. - CBC w/Diff/Platelet - Comprehensive Metabolic Panel (CMET) - Lipid Panel With LDL/HDL Ratio - HgB A1c - lovastatin (MEVACOR) 40 MG tablet; TAKE 1 TABLET(40 MG) BY MOUTH AT BEDTIME  Dispense: 90 tablet; Refill: 3  7. Pain due to onychomycosis of toenails of both feet Long standing issue. Has seen Dr. Prudence Davidson but she reports nothing was done. She is still having issue and request new referral to different provider.  - Ambulatory referral to Podiatry  8. Diarrhea, unspecified type IBS related. Stable. Diagnosis pulled for medication refill. Continue current medical treatment plan. Also using Align probiotics.  - dicyclomine (BENTYL) 10 MG capsule; Take 1 capsule (10 mg total) by mouth 4 (four) times daily -  before meals and at bedtime.  Dispense: 360 capsule; Refill: 3  9. Vaginal atrophy Stable. Diagnosis pulled for medication refill. Continue current medical treatment plan. - estradiol (ESTRACE) 0.1 MG/GM  vaginal cream; Place 1 Applicatorful vaginally at bedtime.  Dispense: 42.5 g; Refill: 12  10. Non-seasonal allergic rhinitis due to pollen New issue. Would like to try a nasal spray to see if this helps her congestion and pressure.  - fluticasone (FLONASE SENSIMIST) 27.5 MCG/SPRAY nasal spray; Place 1-2 sprays into the nose daily.  Dispense: 10 g; Refill: 12   Return in about 6 months (around 11/13/2020) for T2DM.     Reynolds Bowl, PA-C, have reviewed all documentation for this visit. The documentation on 05/14/20 for the exam, diagnosis, procedures, and orders are all accurate and complete.   Rubye Beach  [facility] [phone] (phone) [phone] (fax)  [place]

## 2020-05-15 DIAGNOSIS — E782 Mixed hyperlipidemia: Secondary | ICD-10-CM | POA: Diagnosis not present

## 2020-05-15 DIAGNOSIS — I10 Essential (primary) hypertension: Secondary | ICD-10-CM | POA: Diagnosis not present

## 2020-05-15 DIAGNOSIS — E119 Type 2 diabetes mellitus without complications: Secondary | ICD-10-CM | POA: Diagnosis not present

## 2020-05-16 LAB — COMPREHENSIVE METABOLIC PANEL
ALT: 36 IU/L — ABNORMAL HIGH (ref 0–32)
AST: 37 IU/L (ref 0–40)
Albumin/Globulin Ratio: 1.7 (ref 1.2–2.2)
Albumin: 4.4 g/dL (ref 3.8–4.8)
Alkaline Phosphatase: 99 IU/L (ref 44–121)
BUN/Creatinine Ratio: 20 (ref 12–28)
BUN: 17 mg/dL (ref 8–27)
Bilirubin Total: <0.2 mg/dL (ref 0.0–1.2)
CO2: 25 mmol/L (ref 20–29)
Calcium: 9.5 mg/dL (ref 8.7–10.3)
Chloride: 103 mmol/L (ref 96–106)
Creatinine, Ser: 0.84 mg/dL (ref 0.57–1.00)
Globulin, Total: 2.6 g/dL (ref 1.5–4.5)
Glucose: 168 mg/dL — ABNORMAL HIGH (ref 65–99)
Potassium: 4.7 mmol/L (ref 3.5–5.2)
Sodium: 143 mmol/L (ref 134–144)
Total Protein: 7 g/dL (ref 6.0–8.5)
eGFR: 76 mL/min/{1.73_m2} (ref >59)

## 2020-05-16 LAB — LIPID PANEL WITH LDL/HDL RATIO
Cholesterol, Total: 181 mg/dL (ref 100–199)
HDL: 75 mg/dL (ref >39)
LDL Chol Calc (NIH): 88 mg/dL (ref 0–99)
LDL/HDL Ratio: 1.2 ratio (ref 0.0–3.2)
Triglycerides: 100 mg/dL (ref 0–149)
VLDL Cholesterol Cal: 18 mg/dL (ref 5–40)

## 2020-05-16 LAB — CBC WITH DIFFERENTIAL/PLATELET
Basophils Absolute: 0 10*3/uL (ref 0.0–0.2)
Basos: 1 %
EOS (ABSOLUTE): 0.2 10*3/uL (ref 0.0–0.4)
Eos: 3 %
Hematocrit: 39.7 % (ref 34.0–46.6)
Hemoglobin: 13.2 g/dL (ref 11.1–15.9)
Immature Grans (Abs): 0 10*3/uL (ref 0.0–0.1)
Immature Granulocytes: 0 %
Lymphocytes Absolute: 2.4 10*3/uL (ref 0.7–3.1)
Lymphs: 33 %
MCH: 31.4 pg (ref 26.6–33.0)
MCHC: 33.2 g/dL (ref 31.5–35.7)
MCV: 94 fL (ref 79–97)
Monocytes Absolute: 0.5 10*3/uL (ref 0.1–0.9)
Monocytes: 7 %
Neutrophils Absolute: 4 10*3/uL (ref 1.4–7.0)
Neutrophils: 56 %
Platelets: 282 10*3/uL (ref 150–450)
RBC: 4.21 x10E6/uL (ref 3.77–5.28)
RDW: 13.2 % (ref 11.7–15.4)
WBC: 7.1 10*3/uL (ref 3.4–10.8)

## 2020-05-16 LAB — HEMOGLOBIN A1C
Est. average glucose Bld gHb Est-mCnc: 157 mg/dL
Hgb A1c MFr Bld: 7.1 % — ABNORMAL HIGH (ref 4.8–5.6)

## 2020-05-17 ENCOUNTER — Telehealth: Payer: Self-pay

## 2020-05-17 NOTE — Telephone Encounter (Signed)
Advised patient as below. Patient has a f/u with Dr B in Oct.

## 2020-05-17 NOTE — Telephone Encounter (Signed)
Follow up a1c in 6 months

## 2020-05-17 NOTE — Telephone Encounter (Signed)
Please review. Thanks!  

## 2020-05-17 NOTE — Telephone Encounter (Signed)
Copied from [place] [id number]. Topic: General - Inquiry >> May 17, 2020  9:46 AM Greggory Keen D wrote: Reason for CRM: Pt called saying she seen her results on her my chart and she wants to know when should she come back in for an appt or labs.  CB#  [phone]

## 2020-05-29 ENCOUNTER — Ambulatory Visit (INDEPENDENT_AMBULATORY_CARE_PROVIDER_SITE_OTHER): Payer: Medicare Other | Admitting: Podiatry

## 2020-05-29 ENCOUNTER — Other Ambulatory Visit: Payer: Self-pay

## 2020-05-29 DIAGNOSIS — B351 Tinea unguium: Secondary | ICD-10-CM | POA: Diagnosis not present

## 2020-05-29 DIAGNOSIS — M79674 Pain in right toe(s): Secondary | ICD-10-CM

## 2020-05-29 DIAGNOSIS — M79675 Pain in left toe(s): Secondary | ICD-10-CM

## 2020-06-05 NOTE — Progress Notes (Signed)
   SUBJECTIVE Patient presents to office today complaining of elongated, thickened nails that cause pain while ambulating in shoes.  She is unable to trim her own nails. Patient is here for further evaluation and treatment.  Past Medical History:  Diagnosis Date  . Anxiety   . Depression   . Diabetes mellitus ([place])   . Heart attack ([place]) 04/2007  . Heart attack ([place])   . Hyperlipidemia   . Hypertension   . Obesity   . Recurrent UTI   . Renal lesion   . Urinary incontinence in female   . Vaginal atrophy     OBJECTIVE General Patient is awake, alert, and oriented x 3 and in no acute distress. Derm Skin is dry and supple bilateral. Negative open lesions or macerations. Remaining integument unremarkable. Nails are tender, long, thickened and dystrophic with subungual debris, consistent with onychomycosis, 1-5 bilateral. No signs of infection noted. Vasc  DP and PT pedal pulses palpable bilaterally. Temperature gradient within normal limits.  Neuro Epicritic and protective threshold sensation grossly intact bilaterally.  Musculoskeletal Exam No symptomatic pedal deformities noted bilateral. Muscular strength within normal limits.  ASSESSMENT 1. Onychodystrophic nails 1-5 bilateral with hyperkeratosis of nails.  2. Onychomycosis of nail due to dermatophyte bilateral 3. Pain in foot bilateral  PLAN OF CARE 1. Patient evaluated today.  2. Instructed to maintain good pedal hygiene and foot care.  3. Mechanical debridement of nails 1-5 bilaterally performed using a nail nipper. Filed with dremel without incident.  4. Return to clinic in 3 mos.    Edrick Kins, DPM [facility]  Dr. Edrick Kins, [address]                                        [place], [place] [address]                Office [phone]  Fax [phone]

## 2020-06-21 DIAGNOSIS — F3341 Major depressive disorder, recurrent, in partial remission: Secondary | ICD-10-CM | POA: Diagnosis not present

## 2020-06-21 DIAGNOSIS — F3176 Bipolar disorder, in full remission, most recent episode depressed: Secondary | ICD-10-CM | POA: Diagnosis not present

## 2020-06-21 DIAGNOSIS — F4011 Social phobia, generalized: Secondary | ICD-10-CM | POA: Diagnosis not present

## 2020-06-21 DIAGNOSIS — F900 Attention-deficit hyperactivity disorder, predominantly inattentive type: Secondary | ICD-10-CM | POA: Diagnosis not present

## 2020-06-21 DIAGNOSIS — F410 Panic disorder [episodic paroxysmal anxiety] without agoraphobia: Secondary | ICD-10-CM | POA: Diagnosis not present

## 2020-06-26 ENCOUNTER — Telehealth: Payer: Self-pay | Admitting: Family Medicine

## 2020-06-26 DIAGNOSIS — Z1231 Encounter for screening mammogram for malignant neoplasm of breast: Secondary | ICD-10-CM

## 2020-06-26 NOTE — Telephone Encounter (Signed)
Copied from [place] [phone]. Topic: Referral - Request for Referral >> Jun 26, 2020  4:11 PM Leward Quan A wrote: Has patient seen PCP for this complaint? Yes.   *If NO, is insurance requiring patient see PCP for this issue before PCP can refer them? Referral for which specialty: Mammography Preferred provider/office: [facility]  Reason for referral: yearly mammogram

## 2020-06-27 NOTE — Telephone Encounter (Signed)
Please review.  Is it okay to order?  Thanks,   -Channon Ambrosini  

## 2020-06-27 NOTE — Telephone Encounter (Signed)
Please place order for screening mammo

## 2020-07-19 NOTE — Telephone Encounter (Signed)
Pt is calling to check on the status of referral for mamo. Pt is requesting this to be sent to [facility] breast center. [phone]

## 2020-07-19 NOTE — Telephone Encounter (Signed)
Patient advised, order has been placed. Patient needs to call [facility] to schedule appt.

## 2020-08-08 ENCOUNTER — Telehealth: Payer: Self-pay | Admitting: Physician Assistant

## 2020-08-08 ENCOUNTER — Other Ambulatory Visit: Payer: Self-pay

## 2020-08-08 DIAGNOSIS — E1129 Type 2 diabetes mellitus with other diabetic kidney complication: Secondary | ICD-10-CM

## 2020-08-08 MED ORDER — PIOGLITAZONE HCL 30 MG PO TABS: ORAL_TABLET | ORAL | 0 refills | Status: DC

## 2020-08-08 NOTE — Telephone Encounter (Signed)
[facility] faxed refill request for the following medications:  pioglitazone (ACTOS) 30 MG tablet    Please advise.

## 2020-08-09 DIAGNOSIS — E119 Type 2 diabetes mellitus without complications: Secondary | ICD-10-CM | POA: Diagnosis not present

## 2020-08-09 LAB — HM DIABETES EYE EXAM

## 2020-08-19 DIAGNOSIS — Z20822 Contact with and (suspected) exposure to covid-19: Secondary | ICD-10-CM | POA: Diagnosis not present

## 2020-08-30 ENCOUNTER — Ambulatory Visit: Payer: Medicare Other | Admitting: Family Medicine

## 2020-08-30 ENCOUNTER — Ambulatory Visit
Admission: RE | Admit: 2020-08-30 | Discharge: 2020-08-30 | Disposition: A | Discharge status: Home / Self Care | Payer: Medicare Other | Source: Ambulatory Visit | Attending: Family Medicine | Admitting: Family Medicine

## 2020-08-30 ENCOUNTER — Other Ambulatory Visit: Payer: Self-pay

## 2020-08-30 DIAGNOSIS — Z1231 Encounter for screening mammogram for malignant neoplasm of breast: Secondary | ICD-10-CM | POA: Diagnosis not present

## 2020-09-03 ENCOUNTER — Other Ambulatory Visit: Payer: Self-pay | Admitting: Family Medicine

## 2020-09-03 DIAGNOSIS — N6310 Unspecified lump in the right breast, unspecified quadrant: Secondary | ICD-10-CM

## 2020-09-03 DIAGNOSIS — R928 Other abnormal and inconclusive findings on diagnostic imaging of breast: Secondary | ICD-10-CM

## 2020-09-06 DIAGNOSIS — F3341 Major depressive disorder, recurrent, in partial remission: Secondary | ICD-10-CM | POA: Diagnosis not present

## 2020-09-06 DIAGNOSIS — F4011 Social phobia, generalized: Secondary | ICD-10-CM | POA: Diagnosis not present

## 2020-09-06 DIAGNOSIS — F900 Attention-deficit hyperactivity disorder, predominantly inattentive type: Secondary | ICD-10-CM | POA: Diagnosis not present

## 2020-09-06 DIAGNOSIS — F410 Panic disorder [episodic paroxysmal anxiety] without agoraphobia: Secondary | ICD-10-CM | POA: Diagnosis not present

## 2020-09-06 DIAGNOSIS — F3176 Bipolar disorder, in full remission, most recent episode depressed: Secondary | ICD-10-CM | POA: Diagnosis not present

## 2020-09-11 ENCOUNTER — Ambulatory Visit
Admission: RE | Admit: 2020-09-11 | Discharge: 2020-09-11 | Disposition: A | Discharge status: Home / Self Care | Payer: Medicare Other | Source: Ambulatory Visit | Attending: Family Medicine | Admitting: Family Medicine

## 2020-09-11 ENCOUNTER — Other Ambulatory Visit: Payer: Self-pay

## 2020-09-11 DIAGNOSIS — N6310 Unspecified lump in the right breast, unspecified quadrant: Secondary | ICD-10-CM

## 2020-09-11 DIAGNOSIS — R928 Other abnormal and inconclusive findings on diagnostic imaging of breast: Secondary | ICD-10-CM | POA: Insufficient documentation

## 2020-09-11 DIAGNOSIS — R922 Inconclusive mammogram: Secondary | ICD-10-CM | POA: Diagnosis not present

## 2020-09-14 ENCOUNTER — Telehealth: Payer: Self-pay

## 2020-09-14 NOTE — Telephone Encounter (Signed)
Copied from [place] [phone]. Topic: Medical Record Request - Other >> Sep 14, 2020 11:25 AM Erick Blinks wrote: Evert Kohl from [facility] processing center called to see if [facility] was received, they faxed over request on august 1.  Best contact: [phone]  Route to [facility] for [place] clinics. For all other clinics, route to the clinic's PEC Pool.

## 2020-09-21 NOTE — Telephone Encounter (Signed)
ROI was received 09/18/20. [facility] will process once they come in the office for pick up, sometime next week. 8/15-8/19/22.

## 2020-10-11 DIAGNOSIS — Z20822 Contact with and (suspected) exposure to covid-19: Secondary | ICD-10-CM | POA: Diagnosis not present

## 2020-10-25 ENCOUNTER — Ambulatory Visit: Payer: Self-pay | Admitting: *Deleted

## 2020-10-25 NOTE — Telephone Encounter (Signed)
Summary: Fatigue/Headache   Pt called to request an appt, says she is fatigued/low energy with a headache, experiencing this for a week. Please advise, seeking advice  Best contact: [phone]     After hours call: Attempted to reach pt multiple times; pt's line would disconnect immediately after connection.

## 2020-10-26 ENCOUNTER — Telehealth: Payer: Self-pay

## 2020-10-26 ENCOUNTER — Other Ambulatory Visit: Payer: Self-pay | Admitting: Family Medicine

## 2020-10-26 DIAGNOSIS — Z03818 Encounter for observation for suspected exposure to other biological agents ruled out: Secondary | ICD-10-CM | POA: Diagnosis not present

## 2020-10-26 DIAGNOSIS — M545 Low back pain, unspecified: Secondary | ICD-10-CM | POA: Diagnosis not present

## 2020-10-26 DIAGNOSIS — E1129 Type 2 diabetes mellitus with other diabetic kidney complication: Secondary | ICD-10-CM

## 2020-10-26 MED ORDER — BLOOD GLUCOSE METER KIT: PACK | 0 refills | Status: DC

## 2020-10-26 NOTE — Telephone Encounter (Signed)
Patient was seen at [facility].

## 2020-10-26 NOTE — Telephone Encounter (Signed)
Patient reports she is having low energy, head fog and is concerned that her sugar is dropping. Patient reports that she recently had her A1c checked for life insurance purpose and was told that her A1c is 6.1. patient reports that she has not checked her sugars in several years. Patient is requesting a prescription for glucose monitor sent to [facility] in [place].

## 2020-10-26 NOTE — Telephone Encounter (Signed)
Requesting glucose monitor. Will keep appt with Dr. Jacinto Reap in October.

## 2020-10-26 NOTE — Telephone Encounter (Signed)
lmtcb

## 2020-10-26 NOTE — Telephone Encounter (Signed)
Copied from [place] [id number]. Topic: Appointment Scheduling - Scheduling Inquiry for Clinic >> Oct 26, 2020 12:23 PM Yvette Rack wrote: Reason for CRM: Pt requests appt with Dr. B to discuss diabetes medications. Pt stated she no longer wants to take Metformin due to it causing cancer. Attempted to schedule appt but pt requests sooner appt.

## 2020-11-01 ENCOUNTER — Telehealth: Payer: Self-pay | Admitting: *Deleted

## 2020-11-01 NOTE — Chronic Care Management (AMB) (Signed)
  Chronic Care Management   Note  11/01/2020 Name: PRIA KLOSINSKI MRN: [id number] DOB: 02-07-1951  CORTNY BAMBACH is a 70 y.o. year old female who is a primary care patient of Rubye Beach. I reached out to [name] by phone today in response to a referral sent by Ms. Sheria Lang Raglin's PCP.  Ms. Seiter was given information about Chronic Care Management services today including:  CCM service includes personalized support from designated clinical staff supervised by her physician, including individualized plan of care and coordination with other care providers 24/7 contact phone numbers for assistance for urgent and routine care needs. Service will only be billed when office clinical staff spend 20 minutes or more in a month to coordinate care. Only one practitioner may furnish and bill the service in a calendar month. The patient may stop CCM services at any time (effective at the end of the month) by phone call to the office staff. The patient is responsible for co-pay (up to 20% after annual deductible is met) if co-pay is required by the individual health plan.   Patient agreed to services and verbal consent obtained.   Follow up plan: Telephone appointment with care management team member scheduled for: 11/13/2020  Julian Hy, [facility] [place] [facility]  Direct Dial: [phone]

## 2020-11-02 ENCOUNTER — Telehealth: Payer: Self-pay

## 2020-11-02 NOTE — Telephone Encounter (Signed)
Copied from [place] [id number]. Topic: Medical Record Request - Other >> Nov 02, 2020 11:06 AM Lennox Solders wrote: Patient Name/DOB/MRN #: [id number] Requestor Name/Agency: [facility] faxed medical release form on 10-31-2020 Call Back #: [phone] Information Requested: requesting medical records for last 5 years   Route to [facility] for [facility] clinics. For all other clinics, route to the clinic's PEC Pool.

## 2020-11-05 NOTE — Telephone Encounter (Signed)
Request received on 10/31/20. Waiting for [facility] to pick up.

## 2020-11-05 NOTE — Telephone Encounter (Signed)
Caller name: Nishelle  Relation to pt: from Exam [facility]  Call back number: [phone] Fax [phone]    Reason for call:  Caller inquiring If request can be expedited. Informed caller turnaround time can take up to 30 days. Caller would like to know if we have 5 years worth of records, please note.

## 2020-11-06 ENCOUNTER — Telehealth: Payer: Self-pay

## 2020-11-06 DIAGNOSIS — A084 Viral intestinal infection, unspecified: Secondary | ICD-10-CM | POA: Diagnosis not present

## 2020-11-06 DIAGNOSIS — M545 Low back pain, unspecified: Secondary | ICD-10-CM | POA: Diagnosis not present

## 2020-11-06 NOTE — Telephone Encounter (Addendum)
Pt was seen at [facility] on 10/26/20 for confusion and memory loss and they gave her a note to be out of work until 11/01/20.  Pt went back to work from 11/02/20-11/05/20.  Pt is supposed to go back to work on 11/07/2020 and states that she had a "relapse" today and is not able to go back to work and is wanting a work note until her appt on 11/19/20 with Dr. B.  I advised pt that she may need to go back to where she was seen to get an extension and she stated that they would not help her and told her that she needs to see her PCP.  There are no appts this week for her to see anyone here.  She was requesting to talk with a nurse today if possible.   Dr. B will not be in until 11/08/2020.  Not sure if you can help with this.  Please advise.

## 2020-11-07 NOTE — Telephone Encounter (Signed)
Spoke with patient on the phone who stats that this is an ongoing issue and seems be getting worse due to conditions at work, patient states that she is feeling a lot of pressure from her boss and she states that she has been pushing her harder to do and complete tasks. Patient states that she has been to two urgent cares who have refused seeing her stating that it is a mental health related problem and not illness therefore she would have to see her PCP to address. After speaking with Daneil Dan we have made appt for patient to be reevaluated on 11/09/20. KW

## 2020-11-08 DIAGNOSIS — A084 Viral intestinal infection, unspecified: Secondary | ICD-10-CM | POA: Diagnosis not present

## 2020-11-08 DIAGNOSIS — R5383 Other fatigue: Secondary | ICD-10-CM | POA: Diagnosis not present

## 2020-11-09 ENCOUNTER — Other Ambulatory Visit: Payer: Self-pay

## 2020-11-09 ENCOUNTER — Ambulatory Visit (INDEPENDENT_AMBULATORY_CARE_PROVIDER_SITE_OTHER): Payer: Medicare Other | Admitting: Family Medicine

## 2020-11-09 ENCOUNTER — Encounter: Payer: Self-pay | Admitting: Family Medicine

## 2020-11-09 VITALS — BP 155/70 | HR 72 | Temp 98.3°F | Resp 16 | Wt 203.7 lb

## 2020-11-09 DIAGNOSIS — R41840 Attention and concentration deficit: Secondary | ICD-10-CM | POA: Insufficient documentation

## 2020-11-09 DIAGNOSIS — K529 Noninfective gastroenteritis and colitis, unspecified: Secondary | ICD-10-CM | POA: Insufficient documentation

## 2020-11-09 DIAGNOSIS — F339 Major depressive disorder, recurrent, unspecified: Secondary | ICD-10-CM | POA: Diagnosis not present

## 2020-11-09 DIAGNOSIS — F5102 Adjustment insomnia: Secondary | ICD-10-CM | POA: Insufficient documentation

## 2020-11-09 DIAGNOSIS — F410 Panic disorder [episodic paroxysmal anxiety] without agoraphobia: Secondary | ICD-10-CM | POA: Insufficient documentation

## 2020-11-09 DIAGNOSIS — F419 Anxiety disorder, unspecified: Secondary | ICD-10-CM

## 2020-11-09 DIAGNOSIS — G8929 Other chronic pain: Secondary | ICD-10-CM | POA: Insufficient documentation

## 2020-11-09 DIAGNOSIS — F332 Major depressive disorder, recurrent severe without psychotic features: Secondary | ICD-10-CM | POA: Diagnosis not present

## 2020-11-09 DIAGNOSIS — M545 Low back pain, unspecified: Secondary | ICD-10-CM | POA: Insufficient documentation

## 2020-11-09 MED ORDER — AMPHETAMINE-DEXTROAMPHET ER 10 MG PO CP24: 10.0000 mg | ORAL_CAPSULE | Freq: Every day | ORAL | 0 refills | Status: DC

## 2020-11-09 MED ORDER — HYDROXYZINE PAMOATE 25 MG PO CAPS: 25.0000 mg | ORAL_CAPSULE | Freq: Three times a day (TID) | ORAL | 0 refills | Status: DC | PRN

## 2020-11-09 MED ORDER — TRAZODONE HCL 50 MG PO TABS: 25.0000 mg | ORAL_TABLET | Freq: Every evening | ORAL | 3 refills | Status: DC | PRN

## 2020-11-09 NOTE — Progress Notes (Signed)
Established patient visit   Patient: Melissa Arroyo   DOB: [birth year]   70 y.o. Female  MRN: [id number] Visit Date: 11/09/2020  Today's healthcare provider: [name], FNP   Chief Complaint  Patient presents with   Memory Loss    Patient presents in office to address concerns of possible memory loss. Patient reports memory problems have been present over 30 days and states that it is affecting her job. Patient reports that she has difficulty concentrating, being slow to answer questions and not being able to multi task. Patient states " my brain hurts, in the morning when I wake up im foggy. After eating lunch I become very fatigued." Patient reports that she is scared to death to go to work and retaliation from her boss.   Subjective    HPI HPI     Memory Loss    Additional comments: Patient presents in office to address concerns of possible memory loss. Patient reports memory problems have been present over 30 days and states that it is affecting her job. Patient reports that she has difficulty concentrating, being slow to answer questions and not being able to multi task. Patient states " my brain hurts, in the morning when I wake up im foggy. After eating lunch I become very fatigued." Patient reports that she is scared to death to go to work and retaliation from her boss.      Last edited by Minette Headland, CMA on 11/09/2020  2:55 PM.        Medications: Outpatient Medications Prior to Visit  Medication Sig   Alcohol Swabs (ALCOHOL PREP) PADS Use 1 pad prior to checking blood sugar to clean skin   b complex vitamins capsule Take 1 capsule by mouth daily.   blood glucose meter kit and supplies Dispense based on patient and insurance preference. Check glucose once daily. (FOR ICD-10 E11.29).   buPROPion (WELLBUTRIN XL) 300 MG 24 hr tablet Take 300 mg by mouth every morning.   chlorhexidine (PERIDEX) 0.12 % solution SMARTSIG:By Mouth   Cholecalciferol (VITAMIN  D3) 10 MCG (400 UNIT) tablet Take by mouth daily. Dose unknown   dicyclomine (BENTYL) 10 MG capsule Take 1 capsule (10 mg total) by mouth 4 (four) times daily -  before meals and at bedtime.   estradiol (ESTRACE) 0.1 MG/GM vaginal cream Place 1 Applicatorful vaginally at bedtime.   FLUoxetine (PROZAC) 40 MG capsule TK 1 C PO D   fluticasone (FLONASE SENSIMIST) 27.5 MCG/SPRAY nasal spray Place 1-2 sprays into the nose daily.   glipiZIDE (GLUCOTROL XL) 10 MG 24 hr tablet TAKE 1 TABLET(10 MG) BY MOUTH DAILY WITH BREAKFAST   Glucosamine-Chondroitin (MOVE FREE PO) Take 1 tablet by mouth daily.   ibuprofen (ADVIL,MOTRIN) 200 MG tablet Take 400 mg by mouth at bedtime. As needed   lovastatin (MEVACOR) 40 MG tablet TAKE 1 TABLET(40 MG) BY MOUTH AT BEDTIME   metFORMIN (GLUCOPHAGE) 1000 MG tablet TAKE 1 TABLET(1000 MG) BY MOUTH TWICE DAILY WITH A MEAL   Multiple Vitamins-Minerals (CENTRUM SILVER PO) Take 1 tablet by mouth daily.   OLANZapine (ZYPREXA) 2.5 MG tablet Take 2.5 mg by mouth daily as needed.   pioglitazone (ACTOS) 30 MG tablet TAKE 1 TABLET(30 MG) BY MOUTH DAILY   propranolol (INDERAL) 10 MG tablet TAKE 1 TABLET(10 MG) BY MOUTH TWICE DAILY   zinc gluconate 50 MG tablet Take by mouth daily. Dose unknown   No facility-administered medications prior to visit.  Review of Systems     Objective    BP (!) 155/70   Pulse 72   Temp 98.3 F (36.8 C) (Oral)   Resp 16   Wt 203 lb 11.2 oz (92.4 kg)   BMI 33.90 kg/m  {Show previous vital signs (optional):23777}  Physical Exam Vitals and nursing note reviewed.  Constitutional:      General: She is not in acute distress.    Appearance: Normal appearance. She is obese. She is not ill-appearing, toxic-appearing or diaphoretic.  HENT:     Head: Normocephalic and atraumatic.  Cardiovascular:     Rate and Rhythm: Regular rhythm. Tachycardia present.     Pulses: Normal pulses.     Heart sounds: Normal heart sounds. No murmur heard.   No  friction rub. No gallop.  Pulmonary:     Effort: Pulmonary effort is normal. No respiratory distress.     Breath sounds: Normal breath sounds. No stridor. No wheezing, rhonchi or rales.  Chest:     Chest wall: No tenderness.  Abdominal:     General: Bowel sounds are normal.     Palpations: Abdomen is soft.     Tenderness: There is abdominal tenderness.  Musculoskeletal:        General: No swelling, tenderness, deformity or signs of injury. Normal range of motion.     Right lower leg: No edema.     Left lower leg: No edema.  Skin:    General: Skin is warm and dry.     Capillary Refill: Capillary refill takes less than 2 seconds.     Coloration: Skin is not jaundiced or pale.     Findings: No bruising, erythema, lesion or rash.  Neurological:     General: No focal deficit present.     Mental Status: She is alert and oriented to person, place, and time. Mental status is at baseline.     Cranial Nerves: No cranial nerve deficit.     Sensory: No sensory deficit.     Motor: No weakness.     Coordination: Coordination normal.  Psychiatric:        Attention and Perception: Attention normal.        Mood and Affect: Mood is anxious and depressed. Affect is labile and tearful.        Speech: Speech normal.        Behavior: Behavior is withdrawn.        Thought Content: Thought content is paranoid. Thought content does not include suicidal ideation. Thought content does not include homicidal or suicidal plan.        Cognition and Memory: Cognition normal.        Judgment: Judgment normal.      No results found for any visits on 11/09/20.  Assessment & Plan     Problem List Items Addressed This Visit       Other   Depression, recurrent ([place]) - Primary    Stable Does not wish to change Needs to re-est with counselor       Relevant Medications   hydrOXYzine (VISTARIL) 25 MG capsule   traZODone (DESYREL) 50 MG tablet   Recurrent severe major depressive disorder with anxiety  (HCC)    Worsening anxiety Trial of atarax      Relevant Medications   hydrOXYzine (VISTARIL) 25 MG capsule   traZODone (DESYREL) 50 MG tablet   Attention and concentration deficit    Start adderall Continue to f/u      Relevant Medications  amphetamine-dextroamphetamine (ADDERALL XR) 10 MG 24 hr capsule   Chronic bilateral low back pain without sciatica    D/t stress- pre dx'd On naproxen      Relevant Medications   traZODone (DESYREL) 50 MG tablet   IBD (inflammatory bowel disease)    Remains on bentyl      Panic attacks    Trial of attarax      Relevant Medications   hydrOXYzine (VISTARIL) 25 MG capsule   traZODone (DESYREL) 50 MG tablet   Adjustment insomnia    Trial of trazodone      Relevant Medications   traZODone (DESYREL) 50 MG tablet     Return in about 4 weeks (around 12/07/2020) for anxiety and depression.     Vonna Kotyk, FNP, have reviewed all documentation for this visit. The documentation on 11/09/20 for the exam, diagnosis, procedures, and orders are all accurate and complete.    [name], [place] [phone] (phone) [phone] (fax)  [place]

## 2020-11-09 NOTE — Assessment & Plan Note (Signed)
Start adderall Continue to f/u

## 2020-11-09 NOTE — Assessment & Plan Note (Signed)
Trial of attarax

## 2020-11-09 NOTE — Assessment & Plan Note (Signed)
Worsening anxiety Trial of atarax

## 2020-11-09 NOTE — Assessment & Plan Note (Signed)
Trial of trazodone. 

## 2020-11-09 NOTE — Assessment & Plan Note (Signed)
D/t stress- pre dx'd On naproxen

## 2020-11-09 NOTE — Assessment & Plan Note (Signed)
Stable Does not wish to change Needs to re-est with counselor

## 2020-11-09 NOTE — Assessment & Plan Note (Signed)
Remains on bentyl

## 2020-11-12 ENCOUNTER — Telehealth: Payer: Self-pay

## 2020-11-12 NOTE — Telephone Encounter (Signed)
Received Disability & Leave forms from [place]. Forms placed in Elise's box. Please advise. TNP

## 2020-11-13 ENCOUNTER — Ambulatory Visit (INDEPENDENT_AMBULATORY_CARE_PROVIDER_SITE_OTHER): Payer: Medicare Other

## 2020-11-13 ENCOUNTER — Encounter: Payer: Self-pay | Admitting: Family Medicine

## 2020-11-13 DIAGNOSIS — E1129 Type 2 diabetes mellitus with other diabetic kidney complication: Secondary | ICD-10-CM

## 2020-11-13 DIAGNOSIS — R809 Proteinuria, unspecified: Secondary | ICD-10-CM

## 2020-11-13 DIAGNOSIS — Z9181 History of falling: Secondary | ICD-10-CM

## 2020-11-13 DIAGNOSIS — I10 Essential (primary) hypertension: Secondary | ICD-10-CM

## 2020-11-13 NOTE — Chronic Care Management (AMB) (Signed)
Chronic Care Management   CCM RN Visit Note  11/13/2020 Name: Melissa Arroyo MRN: [id number] DOB: Sep 07, 1950  Subjective: Melissa Arroyo is a 70 y.o. year old female who is a primary care patient of [name], FNP. The care management team was consulted for assistance with disease management and care coordination needs.    Engaged with patient by telephone for initial visit in response to provider referral for case management and care coordination services.   Consent to Services:  The patient was given the following information about Chronic Care Management services: 1. CCM service includes personalized support from designated clinical staff supervised by the primary care provider, including individualized plan of care and coordination with other care providers 2. 24/7 contact phone numbers for assistance for urgent and routine care needs. 3. Service will only be billed when office clinical staff spend 20 minutes or more in a month to coordinate care. 4. Only one practitioner may furnish and bill the service in a calendar month. 5.The patient may stop CCM services at any time (effective at the end of the month) by phone call to the office staff. 6. The patient will be responsible for cost sharing (co-pay) of up to 20% of the service fee (after annual deductible is met). Patient agreed to services and consent obtained.  Assessment: Review of patient past medical history, allergies, medications, health status, including review of consultants reports, laboratory and other test data, was performed as part of comprehensive evaluation and provision of chronic care management services.   SDOH (Social Determinants of Health) assessments and interventions performed:  SDOH Interventions    Flowsheet Row Most Recent Value  SDOH Interventions   Food Insecurity Interventions Intervention Not Indicated  Transportation Interventions Intervention Not Indicated        CCM Care Plan  Allergies   Allergen Reactions   Penicillins Rash   Vesicare [Solifenacin] Anaphylaxis    Throat Closed    Outpatient Encounter Medications as of 11/13/2020  Medication Sig   buPROPion (WELLBUTRIN XL) 300 MG 24 hr tablet Take 300 mg by mouth every morning.   dicyclomine (BENTYL) 10 MG capsule Take 1 capsule (10 mg total) by mouth 4 (four) times daily -  before meals and at bedtime.   FLUoxetine (PROZAC) 40 MG capsule TK 1 C PO D   fluticasone (FLONASE SENSIMIST) 27.5 MCG/SPRAY nasal spray Place 1-2 sprays into the nose daily.   glipiZIDE (GLUCOTROL XL) 10 MG 24 hr tablet TAKE 1 TABLET(10 MG) BY MOUTH DAILY WITH BREAKFAST   hydrOXYzine (VISTARIL) 25 MG capsule Take 1 capsule (25 mg total) by mouth every 8 (eight) hours as needed for anxiety.   ibuprofen (ADVIL,MOTRIN) 200 MG tablet Take 400 mg by mouth at bedtime. As needed   metFORMIN (GLUCOPHAGE) 1000 MG tablet TAKE 1 TABLET(1000 MG) BY MOUTH TWICE DAILY WITH A MEAL   Multiple Vitamins-Minerals (CENTRUM SILVER PO) Take 1 tablet by mouth daily.   propranolol (INDERAL) 10 MG tablet TAKE 1 TABLET(10 MG) BY MOUTH TWICE DAILY   traZODone (DESYREL) 50 MG tablet Take 0.5-1 tablets (25-50 mg total) by mouth at bedtime as needed for sleep.   zinc gluconate 50 MG tablet Take by mouth daily. Dose unknown   Alcohol Swabs (ALCOHOL PREP) PADS Use 1 pad prior to checking blood sugar to clean skin   amphetamine-dextroamphetamine (ADDERALL XR) 10 MG 24 hr capsule Take 1 capsule (10 mg total) by mouth daily.   b complex vitamins capsule Take 1 capsule by mouth  daily.   blood glucose meter kit and supplies Dispense based on patient and insurance preference. Check glucose once daily. (FOR ICD-10 E11.29).   chlorhexidine (PERIDEX) 0.12 % solution SMARTSIG:By Mouth   Cholecalciferol (VITAMIN D3) 10 MCG (400 UNIT) tablet Take by mouth daily. Dose unknown (Patient not taking: Reported on 11/13/2020)   estradiol (ESTRACE) 0.1 MG/GM vaginal cream Place 1 Applicatorful  vaginally at bedtime.   Glucosamine-Chondroitin (MOVE FREE PO) Take 1 tablet by mouth daily. (Patient not taking: Reported on 11/13/2020)   lovastatin (MEVACOR) 40 MG tablet TAKE 1 TABLET(40 MG) BY MOUTH AT BEDTIME (Patient not taking: Reported on 11/13/2020)   OLANZapine (ZYPREXA) 2.5 MG tablet Take 2.5 mg by mouth daily as needed.   pioglitazone (ACTOS) 30 MG tablet TAKE 1 TABLET(30 MG) BY MOUTH DAILY   No facility-administered encounter medications on file as of 11/13/2020.    Patient Active Problem List   Diagnosis Date Noted   Recurrent severe major depressive disorder with anxiety ([place]) 11/09/2020   Attention and concentration deficit 11/09/2020   Chronic bilateral low back pain without sciatica 11/09/2020   IBD (inflammatory bowel disease) 11/09/2020   Panic attacks 11/09/2020   Adjustment insomnia 11/09/2020   Pain due to onychomycosis of toenails of both feet 08/08/2019   Panic disorder with agoraphobia and mild panic attacks 06/02/2019   Cervical high risk HPV (human papillomavirus) test positive 04/18/2019   Depression, recurrent ([place]) 05/26/2016   Benign essential HTN 02/14/2015   Dysuria 12/24/2014   Dyspareunia in female 09/28/2014   Atrophic vaginitis 09/28/2014   Urge incontinence 09/28/2014   Renal cyst, left 07/24/2014   Microscopic hematuria 07/24/2014   Headache, tension-type 07/24/2014   Combined fat and carbohydrate induced hyperlipemia 07/24/2014   Diabetes mellitus, type 2 ([place]) 07/24/2014    Conditions to be addressed/monitored:HTN, HLD, DMII, and Fall Risk Patient Care Plan: Coronary Artery Disease, HTN, HLD     Problem Identified: CAD, HTN, HLD      Long-Range Goal: Disease Progression Prevented or Minimized   Start Date: 11/13/2020  Expected End Date: 02/11/2021  Priority: High  Note:   Objective:  Last practice recorded BP readings:  BP Readings from Last 3 Encounters:  11/09/20 (!) 155/70  05/14/20 (!) 147/81  08/08/19 (!) 160/78   Most  recent eGFR/CrCl:  Lab Results  Component Value Date   EGFR 76 05/15/2020    No components found for: CRCL  Lab Results  Component Value Date   CHOL 181 05/15/2020   HDL 75 05/15/2020   LDLCALC 88 05/15/2020   TRIG 100 05/15/2020   CHOLHDL 2.3 04/18/2019    Current Barriers:  Chronic Disease Management support and educational needs related to HTN, HLD and CAD.  Case Manager Clinical Goal(s):  Over the next 90 days, patient will demonstrate improved adherence to prescribed treatment plan as evidenced by taking all medications as prescribed, monitoring and recording blood pressure and adhering to a low sodium/DASH diet.   Interventions:  Collaboration with [name], FNP regarding development and update of comprehensive plan of care as evidenced by provider attestation and co-signature Inter-disciplinary care team collaboration (see longitudinal plan of care) Reviewed medications. Advised to take medications as prescribed. Advised to notify her provider if unable to tolerate prescribed regimen. Advised to update the care management team with concerns related to medication management and prescription cost. Provided information regarding established blood pressure parameters along with indications for notifying a provider. Encouraged to monitor and record readings.  Discussed compliance with recommended  cardiac prudent diet. Encouraged to read nutrition labels, monitor sodium intake and avoid highly processed foods when possible. Discussed current activity level. She is reports activity level has declined d/t fatigue, "fogginess" and chronic back pain. Does not exercise regularly but able to tolerate mild activity with complications. Denies episodes of chest discomfort, palpitations or shortness of breath with activity.  Discussed complications of uncontrolled blood pressure. Reviewed s/sx of heart attack, stroke and worsening symptoms that require immediate medical attention.  Patient  Goals/Self-Care Activities: Self-administer medications as prescribed Monitor and record blood pressure Adhere to recommended cardiac prudent/heart healthy diet Notify provider or care management team with questions and new concerns as needed   Follow Up Plan:  Will follow up next month    Patient Care Plan: Diabetes Type 2 (Adult)     Problem Identified: Glycemic Management (Diabetes, Type 2)      Goal: Glycemic Management Optimized   Start Date: 11/13/2020  Expected End Date: 02/11/2021  Priority: High  Note:   Objective:  Lab Results  Component Value Date   HGBA1C 7.1 (H) 05/15/2020   Lab Results  Component Value Date   CREATININE 0.84 05/15/2020   CREATININE 0.82 04/18/2019   CREATININE 0.80 04/04/2019   Lab Results  Component Value Date   EGFR 76 05/15/2020   Current Barriers:  Chronic Disease Management support and educational needs related to Diabetes.  Case Manager Clinical Goal(s):  Over the next 90 days, patient will demonstrate improved adherence to prescribed treatment plan for diabetes self care/management as evidenced by taking all medications as prescribed,  daily monitoring and recording of CBG's and adherence to a ADA/ carb modified diet.  Interventions:  Collaboration with [name], FNP regarding development and update of comprehensive plan of care as evidenced by provider attestation and co-signature Inter-disciplinary care team collaboration (see longitudinal plan of care) Reviewed medications and importance of compliance.  Provided information regarding importance of consistent blood glucose monitoring. Encouraged to monitor and maintain a log. Reports recent fasting readings have ranged from the 130's to 140's.  Reviewed s/sx of hypoglycemia and hyperglycemia along with recommended interventions. Discussed nutritional intake and importance of complying with an ADA/carb modified diet. Advised to continue monitoring intake of carbohydrates and  avoid concentrated sugars when possible. Discussed and offered referrals for available Diabetes education classes. Declined current need for additional resources. Discussed importance of completing recommended DM preventive care. Advised to continue performing daily foot care and notify provider if changes are noted. Reports completing annual eye exam as recommended.  Patient Goals/Self-Care Activities Self-administer medications as prescribed Attend all scheduled provider appointments Monitor blood glucose levels consistently and utilize recommended interventions Adhere to prescribed ADA/carb modified  Notify provider or care management team with questions and new concerns as needed   Follow Up Plan:  Will follow up next month    Patient Care Plan: Fall Risk (Adult)     Problem Identified: Fall Risk      Long-Range Goal: Absence of Fall and Fall-Related Injury   Start Date: 11/13/2020  Expected End Date: 02/11/2021  Priority: High  Note:   Current Barriers:  Fall Risk related to Fatigue, Chronic Back Pain and Impaired Balance  Clinical Goal(s):  Over the next 90 days, patient will not experience falls or require hospitalization due to fall related injuries.  Interventions:  Collaboration with [name], FNP regarding development and update of comprehensive plan of care as evidenced by provider attestation and co-signature Inter-disciplinary care team collaboration (see longitudinal  plan of care) Reviewed medications and discussed potential side effects such as dizziness and lightheadedness. Provided information regarding safety and fall prevention. Advised to use an assistive device if needed. Advised to avoid prolonged activity to prevent accident falls d/t exertion. Encouraged to avoid attempting to lift weighted objects and request assistance when needed.  Discussed ability to perform ADL's and tasks in the home. Reports currently completing ADL's and IADL's independently.  Declines current need for additional assistance in the home. Will keep the care management team updated if functional status changes.   Self-Care Deficits/Patient Goals:  Utilize assistive device appropriately with all ambulation Ensure pathways are clear and well lit Change positions slowly and use caution when ambulating Wear secure fitting, skid free footwear when ambulating Update the care management team with changes in functional status Notify provider or care management team for questions and new concerns as needed   Follow Up Plan:  Will follow up next month       PLAN A member of the care management team will follow up next month.   [facility] [place] [phone]

## 2020-11-19 ENCOUNTER — Encounter: Payer: Self-pay | Admitting: Family Medicine

## 2020-11-19 ENCOUNTER — Ambulatory Visit (INDEPENDENT_AMBULATORY_CARE_PROVIDER_SITE_OTHER): Payer: Medicare Other | Admitting: Family Medicine

## 2020-11-19 ENCOUNTER — Other Ambulatory Visit: Payer: Self-pay

## 2020-11-19 VITALS — BP 135/58 | HR 73 | Temp 98.1°F | Resp 16 | Ht 65.0 in | Wt 202.3 lb

## 2020-11-19 DIAGNOSIS — F410 Panic disorder [episodic paroxysmal anxiety] without agoraphobia: Secondary | ICD-10-CM

## 2020-11-19 DIAGNOSIS — E785 Hyperlipidemia, unspecified: Secondary | ICD-10-CM | POA: Diagnosis not present

## 2020-11-19 DIAGNOSIS — R413 Other amnesia: Secondary | ICD-10-CM

## 2020-11-19 DIAGNOSIS — E1169 Type 2 diabetes mellitus with other specified complication: Secondary | ICD-10-CM

## 2020-11-19 DIAGNOSIS — F339 Major depressive disorder, recurrent, unspecified: Secondary | ICD-10-CM | POA: Diagnosis not present

## 2020-11-19 DIAGNOSIS — R809 Proteinuria, unspecified: Secondary | ICD-10-CM

## 2020-11-19 DIAGNOSIS — R7989 Other specified abnormal findings of blood chemistry: Secondary | ICD-10-CM | POA: Diagnosis not present

## 2020-11-19 DIAGNOSIS — Z1211 Encounter for screening for malignant neoplasm of colon: Secondary | ICD-10-CM | POA: Diagnosis not present

## 2020-11-19 DIAGNOSIS — F5102 Adjustment insomnia: Secondary | ICD-10-CM

## 2020-11-19 DIAGNOSIS — F411 Generalized anxiety disorder: Secondary | ICD-10-CM | POA: Insufficient documentation

## 2020-11-19 DIAGNOSIS — R4189 Other symptoms and signs involving cognitive functions and awareness: Secondary | ICD-10-CM

## 2020-11-19 DIAGNOSIS — R41840 Attention and concentration deficit: Secondary | ICD-10-CM

## 2020-11-19 DIAGNOSIS — Z23 Encounter for immunization: Secondary | ICD-10-CM | POA: Diagnosis not present

## 2020-11-19 DIAGNOSIS — Z800 Family history of malignant neoplasm of digestive organs: Secondary | ICD-10-CM | POA: Diagnosis not present

## 2020-11-19 DIAGNOSIS — E1129 Type 2 diabetes mellitus with other diabetic kidney complication: Secondary | ICD-10-CM

## 2020-11-19 MED ORDER — FLUOXETINE HCL 20 MG PO CAPS: 60.0000 mg | ORAL_CAPSULE | Freq: Every day | ORAL | 3 refills | Status: DC

## 2020-11-19 NOTE — Assessment & Plan Note (Addendum)
-   Chronic, previously stable - Continue Lovastatin - Recheck CMP & lipid panel

## 2020-11-19 NOTE — Assessment & Plan Note (Signed)
-   Chronic, previously stable - Recheck A1c - Continue Metformin & Actos

## 2020-11-19 NOTE — Assessment & Plan Note (Addendum)
-   Chronic, stable, possibly contributing to brain fog - Increase Prozac to 60 mg daily - Continue Wellbutrin & Olanzapine - Refer to psychiatry for further workup & management

## 2020-11-19 NOTE — Assessment & Plan Note (Signed)
-   Chronic, poorly controlled - Increase Prozac to 60 mg daily - Continue Olanzapine - Refer to psychiatry

## 2020-11-19 NOTE — Assessment & Plan Note (Signed)
-   Chronic, poorly controlled - Discontinued Hydroxyzine due to concern about interaction with Olanzapine - Refer to psychiatry for further workup & management

## 2020-11-19 NOTE — Assessment & Plan Note (Signed)
-   Chronic, poorly controlled - Discontinue Adderall due to adverse effects - Refer to psychiatry for further workup and management

## 2020-11-19 NOTE — Patient Instructions (Signed)
Thank you for allowing the Chronic Care Management team to participate in your care.   Patient Care Plan: Coronary Artery Disease, HTN, HLD     Problem Identified: CAD, HTN, HLD      Long-Range Goal: Disease Progression Prevented or Minimized   Start Date: 11/13/2020  Expected End Date: 02/11/2021  Priority: High  Note:   Objective:  Last practice recorded BP readings:  BP Readings from Last 3 Encounters:  11/09/20 (!) 155/70  05/14/20 (!) 147/81  08/08/19 (!) 160/78   Most recent eGFR/CrCl:  Lab Results  Component Value Date   EGFR 76 05/15/2020    No components found for: CRCL  Lab Results  Component Value Date   CHOL 181 05/15/2020   HDL 75 05/15/2020   LDLCALC 88 05/15/2020   TRIG 100 05/15/2020   CHOLHDL 2.3 04/18/2019    Current Barriers:  Chronic Disease Management support and educational needs related to HTN, HLD and CAD.  Case Manager Clinical Goal(s):  Over the next 90 days, patient will demonstrate improved adherence to prescribed treatment plan as evidenced by taking all medications as prescribed, monitoring and recording blood pressure and adhering to a low sodium/DASH diet.   Interventions:  Collaboration with [name], FNP regarding development and update of comprehensive plan of care as evidenced by provider attestation and co-signature Inter-disciplinary care team collaboration (see longitudinal plan of care) Reviewed medications. Advised to take medications as prescribed. Advised to notify her provider if unable to tolerate prescribed regimen. Advised to update the care management team with concerns related to medication management and prescription cost. Provided information regarding established blood pressure parameters along with indications for notifying a provider. Encouraged to monitor and record readings.  Discussed compliance with recommended cardiac prudent diet. Encouraged to read nutrition labels, monitor sodium intake and avoid highly  processed foods when possible. Discussed current activity level. She is reports activity level has declined d/t fatigue, "fogginess" and chronic back pain. Does not exercise regularly but able to tolerate mild activity with complications. Denies episodes of chest discomfort, palpitations or shortness of breath with activity.  Discussed complications of uncontrolled blood pressure. Reviewed s/sx of heart attack, stroke and worsening symptoms that require immediate medical attention.  Patient Goals/Self-Care Activities: Self-administer medications as prescribed Monitor and record blood pressure Adhere to recommended cardiac prudent/heart healthy diet Notify provider or care management team with questions and new concerns as needed   Follow Up Plan:  Will follow up next month    Patient Care Plan: Diabetes Type 2 (Adult)     Problem Identified: Glycemic Management (Diabetes, Type 2)      Goal: Glycemic Management Optimized   Start Date: 11/13/2020  Expected End Date: 02/11/2021  Priority: High  Note:   Objective:  Lab Results  Component Value Date   HGBA1C 7.1 (H) 05/15/2020   Lab Results  Component Value Date   CREATININE 0.84 05/15/2020   CREATININE 0.82 04/18/2019   CREATININE 0.80 04/04/2019   Lab Results  Component Value Date   EGFR 76 05/15/2020   Current Barriers:  Chronic Disease Management support and educational needs related to Diabetes.  Case Manager Clinical Goal(s):  Over the next 90 days, patient will demonstrate improved adherence to prescribed treatment plan for diabetes self care/management as evidenced by taking all medications as prescribed,  daily monitoring and recording of CBG's and adherence to a ADA/ carb modified diet.  Interventions:  Collaboration with [name], FNP regarding development and update of comprehensive plan  of care as evidenced by provider attestation and co-signature Inter-disciplinary care team collaboration (see longitudinal  plan of care) Reviewed medications and importance of compliance.  Provided information regarding importance of consistent blood glucose monitoring. Encouraged to monitor and maintain a log. Reports recent fasting readings have ranged from the 130's to 140's.  Reviewed s/sx of hypoglycemia and hyperglycemia along with recommended interventions. Discussed nutritional intake and importance of complying with an ADA/carb modified diet. Advised to continue monitoring intake of carbohydrates and avoid concentrated sugars when possible. Discussed and offered referrals for available Diabetes education classes. Declined current need for additional resources. Discussed importance of completing recommended DM preventive care. Advised to continue performing daily foot care and notify provider if changes are noted. Reports completing annual eye exam as recommended.  Patient Goals/Self-Care Activities Self-administer medications as prescribed Attend all scheduled provider appointments Monitor blood glucose levels consistently and utilize recommended interventions Adhere to prescribed ADA/carb modified  Notify provider or care management team with questions and new concerns as needed   Follow Up Plan:  Will follow up next month    Patient Care Plan: Fall Risk (Adult)     Problem Identified: Fall Risk      Long-Range Goal: Absence of Fall and Fall-Related Injury   Start Date: 11/13/2020  Expected End Date: 02/11/2021  Priority: High  Note:   Current Barriers:  Fall Risk related to Fatigue, Chronic Back Pain and Impaired Balance  Clinical Goal(s):  Over the next 90 days, patient will not experience falls or require hospitalization due to fall related injuries.  Interventions:  Collaboration with [name], FNP regarding development and update of comprehensive plan of care as evidenced by provider attestation and co-signature Inter-disciplinary care team collaboration (see longitudinal plan of  care) Reviewed medications and discussed potential side effects such as dizziness and lightheadedness. Provided information regarding safety and fall prevention. Advised to use an assistive device if needed. Advised to avoid prolonged activity to prevent accident falls d/t exertion. Encouraged to avoid attempting to lift weighted objects and request assistance when needed.  Discussed ability to perform ADL's and tasks in the home. Reports currently completing ADL's and IADL's independently. Declines current need for additional assistance in the home. Will keep the care management team updated if functional status changes.   Self-Care Deficits/Patient Goals:  Utilize assistive device appropriately with all ambulation Ensure pathways are clear and well lit Change positions slowly and use caution when ambulating Wear secure fitting, skid free footwear when ambulating Update the care management team with changes in functional status Notify provider or care management team for questions and new concerns as needed   Follow Up Plan:  Will follow up next month        Melissa Arroyo verbalized understanding of the information discussed during the telephonic outreach. Declined need for mailed/printed instructions. A member of the care management team will follow up next month.   [facility] [phone]

## 2020-11-19 NOTE — Progress Notes (Signed)
Established patient visit   Patient: Melissa Arroyo   DOB: Jun 04, 1950   70 y.o. Female  MRN: [id number] Visit Date: 11/19/2020  Today's healthcare provider: Lavon Paganini, MD   Chief Complaint  Patient presents with   ADHD   Anxiety   Depression   Subjective    ADHD - Pt. reports that she started on Adderall on 10/4, but discontinued it on 10/7 - States that she experienced loss of balance, panic attacks, confusion and brain fog after starting medication -  felt as though she was "drunk" - She reports chronic concerns of brain fog, difficulty concentrating, memory loss, and difficulty multitasking - She has never taken any other medications for ADHD  Anxiety - Pt. reports recent exacerbations of anxiety, mostly attributed to work stress - She was prescribed Hydroxyzine at the last visit, but states that she hasn't used it due to concerns that it will make her excessively tired in combination with Olanzapine at night - Pt. requests referral for a new psychiatrist and therapist  Depression - Currently taking Wellbutrin, Prozac, & Olanzapine - Pt. denies recent exacerbation of depressive symptoms  Type 2 Diabetes - Currently taking Metformin & Actos - pt. reports concerns that Metformin may increase her risk of cancer - She also endorses increased brain fog during the workday and questions if this is due to episodes of hypoglycemia  Health Maintenance - Pt. endorses FH of colon cancer in her mother; requests referral for screening colonoscopy   Medications: Outpatient Medications Prior to Visit  Medication Sig   Alcohol Swabs (ALCOHOL PREP) PADS Use 1 pad prior to checking blood sugar to clean skin   b complex vitamins capsule Take 1 capsule by mouth daily.   blood glucose meter kit and supplies Dispense based on patient and insurance preference. Check glucose once daily. (FOR ICD-10 E11.29).   buPROPion (WELLBUTRIN XL) 300 MG 24 hr tablet Take 300 mg by mouth  every morning.   chlorhexidine (PERIDEX) 0.12 % solution SMARTSIG:By Mouth   Cholecalciferol (VITAMIN D3) 10 MCG (400 UNIT) tablet Take by mouth daily. Dose unknown   dicyclomine (BENTYL) 10 MG capsule Take 1 capsule (10 mg total) by mouth 4 (four) times daily -  before meals and at bedtime.   estradiol (ESTRACE) 0.1 MG/GM vaginal cream Place 1 Applicatorful vaginally at bedtime.   fluticasone (FLONASE SENSIMIST) 27.5 MCG/SPRAY nasal spray Place 1-2 sprays into the nose daily.   glipiZIDE (GLUCOTROL XL) 10 MG 24 hr tablet TAKE 1 TABLET(10 MG) BY MOUTH DAILY WITH BREAKFAST   Glucosamine-Chondroitin (MOVE FREE PO) Take 1 tablet by mouth daily.   ibuprofen (ADVIL,MOTRIN) 200 MG tablet Take 400 mg by mouth at bedtime. As needed   lovastatin (MEVACOR) 40 MG tablet TAKE 1 TABLET(40 MG) BY MOUTH AT BEDTIME   metFORMIN (GLUCOPHAGE) 1000 MG tablet TAKE 1 TABLET(1000 MG) BY MOUTH TWICE DAILY WITH A MEAL   Multiple Vitamins-Minerals (CENTRUM SILVER PO) Take 1 tablet by mouth daily.   OLANZapine (ZYPREXA) 2.5 MG tablet Take 2.5 mg by mouth daily as needed.   pioglitazone (ACTOS) 30 MG tablet TAKE 1 TABLET(30 MG) BY MOUTH DAILY   propranolol (INDERAL) 10 MG tablet TAKE 1 TABLET(10 MG) BY MOUTH TWICE DAILY   zinc gluconate 50 MG tablet Take by mouth daily. Dose unknown   [DISCONTINUED] FLUoxetine (PROZAC) 40 MG capsule TK 1 C PO D   [DISCONTINUED] hydrOXYzine (VISTARIL) 25 MG capsule Take 1 capsule (25 mg total) by mouth every 8 (  eight) hours as needed for anxiety.   [DISCONTINUED] traZODone (DESYREL) 50 MG tablet Take 0.5-1 tablets (25-50 mg total) by mouth at bedtime as needed for sleep.   [DISCONTINUED] amphetamine-dextroamphetamine (ADDERALL XR) 10 MG 24 hr capsule Take 1 capsule (10 mg total) by mouth daily. (Patient not taking: Reported on 11/19/2020)   No facility-administered medications prior to visit.    Review of Systems  Constitutional:  Positive for fatigue. Negative for activity change,  appetite change and fever.  HENT: Negative.    Eyes: Negative.   Respiratory:  Negative for chest tightness and shortness of breath.   Cardiovascular:  Negative for chest pain and leg swelling.  Gastrointestinal: Negative.   Endocrine: Negative.   Genitourinary: Negative.   Musculoskeletal: Negative.   Skin: Negative.   Psychiatric/Behavioral:  Positive for confusion, decreased concentration and dysphoric mood. The patient is nervous/anxious.       Objective    BP (!) 135/58 (BP Location: Left Arm, Patient Position: Sitting, Cuff Size: Large)   Pulse 73   Temp 98.1 F (36.7 C) (Oral)   Resp 16   Ht '5\' 5"'  (1.651 m)   Wt 202 lb 4.8 oz (91.8 kg)   SpO2 100%   BMI 33.66 kg/m  {Show previous vital signs (optional):23777}   Physical Exam Constitutional:      Appearance: Normal appearance. She is obese.  HENT:     Head: Normocephalic and atraumatic.     Right Ear: External ear normal.     Left Ear: External ear normal.  Eyes:     Conjunctiva/sclera: Conjunctivae normal.  Cardiovascular:     Rate and Rhythm: Normal rate and regular rhythm.     Pulses: Normal pulses.     Heart sounds: Normal heart sounds.  Pulmonary:     Effort: Pulmonary effort is normal.     Breath sounds: Normal breath sounds.  Skin:    General: Skin is warm and dry.  Neurological:     Mental Status: She is alert.  Psychiatric:        Behavior: Behavior normal.        Thought Content: Thought content normal.     Results for orders placed or performed in visit on 11/19/20  Hemoglobin A1c  Result Value Ref Range   Hgb A1c MFr Bld 6.7 (H) 4.8 - 5.6 %   Est. average glucose Bld gHb Est-mCnc 146 mg/dL  TSH  Result Value Ref Range   TSH 4.600 (H) 0.450 - 4.500 uIU/mL  Comprehensive metabolic panel  Result Value Ref Range   Glucose 101 (H) 70 - 99 mg/dL   BUN 22 8 - 27 mg/dL   Creatinine, Ser 0.88 0.57 - 1.00 mg/dL   eGFR 72 >59 mL/min/1.73   BUN/Creatinine Ratio 25 12 - 28   Sodium 140 134 - 144  mmol/L   Potassium 5.3 (H) 3.5 - 5.2 mmol/L   Chloride 99 96 - 106 mmol/L   CO2 25 20 - 29 mmol/L   Calcium 10.4 (H) 8.7 - 10.3 mg/dL   Total Protein 7.5 6.0 - 8.5 g/dL   Albumin 4.7 3.8 - 4.8 g/dL   Globulin, Total 2.8 1.5 - 4.5 g/dL   Albumin/Globulin Ratio 1.7 1.2 - 2.2   Bilirubin Total 0.2 0.0 - 1.2 mg/dL   Alkaline Phosphatase 100 44 - 121 IU/L   AST 38 0 - 40 IU/L   ALT 46 (H) 0 - 32 IU/L  Lipid panel  Result Value Ref Range   Cholesterol,  Total 280 (H) 100 - 199 mg/dL   Triglycerides 140 0 - 149 mg/dL   HDL 81 >39 mg/dL   VLDL Cholesterol Cal 25 5 - 40 mg/dL   LDL Chol Calc (NIH) 174 (H) 0 - 99 mg/dL   Chol/HDL Ratio 3.5 0.0 - 4.4 ratio  B12  Result Value Ref Range   Vitamin B-12 389 232 - 1,245 pg/mL    Assessment & Plan     Problem List Items Addressed This Visit       Endocrine   Hyperlipidemia associated with type 2 diabetes mellitus ([place])    - Chronic, previously stable - Continue Lovastatin - Recheck CMP & lipid panel      Relevant Orders   Comprehensive metabolic panel   Lipid panel   Diabetes mellitus, type 2 (HCC)    - Chronic, previously stable - Recheck A1c - Continue Metformin & Actos      Relevant Orders   Hemoglobin A1c     Other   Depression, recurrent (HCC)    - Chronic, stable, possibly contributing to brain fog - Increase Prozac to 60 mg daily - Continue Wellbutrin & Olanzapine - Refer to psychiatry for further workup & management      Relevant Medications   FLUoxetine (PROZAC) 20 MG capsule   Other Relevant Orders   Ambulatory referral to Psychiatry   Attention and concentration deficit    - Chronic, poorly controlled - Discontinue Adderall due to adverse effects - Refer to psychiatry for further workup and management      Relevant Orders   Ambulatory referral to Psychiatry   Panic attacks    - Chronic, poorly controlled - Discontinued Hydroxyzine due to concern about interaction with Olanzapine - Refer to psychiatry  for further workup & management      Relevant Medications   FLUoxetine (PROZAC) 20 MG capsule   Other Relevant Orders   Ambulatory referral to Psychiatry   Adjustment insomnia    - Chronic and stable - Continue Olanzapine - Will continue to monitor      Relevant Orders   Ambulatory referral to Psychiatry   GAD (generalized anxiety disorder)    - Chronic, poorly controlled - Increase Prozac to 60 mg daily - Continue Olanzapine - Refer to psychiatry      Relevant Medications   FLUoxetine (PROZAC) 20 MG capsule   Other Relevant Orders   Ambulatory referral to Psychiatry   Family history of colon cancer    - FH of colon cancer in 1st degree relative - Refer for screening colonoscopy      Relevant Orders   Ambulatory referral to Gastroenterology   Other Visit Diagnoses     Brain fog    -  Primary    - Chronic and poorly controlled  - Differential includes poorly controlled anxiety/depression, hypothyroidism, B12 deficiency, hypoglycemia, or neurogenic etiology  - Refer to neuro for further workup/management  - Recheck TSH, A1c, & B12  - Disability paperwork completed for temporary medical leave from work  - Will continue to monitor    Relevant Orders   Ambulatory referral to Neurology   TSH   B12   Memory deficit        - Chronic and poorly controlled  - Normal Mini Cog performed in office today  - No known history of TBI or neurodegenerative condition  - Refer to neuro for further workup/management  - Recheck TSH, A1c, & B12  - Disability paperwork completed for temporary medical leave from  work  - Will continue to monitor    Relevant Orders   Ambulatory referral to Neurology   TSH   B12   Need for influenza vaccination        - Flu vaccine delivered in office today     Relevant Orders   Flu Vaccine QUAD High Dose(Fluad) (Completed)   Colon cancer screening        - Refer for screening colonoscopy    Relevant Orders   Ambulatory referral to  Gastroenterology        Return in about 3 months (around 02/19/2021) for chronic disease f/u, With new PCP.      Total time spent on today's visit was greater than 40 minutes, including both face-to-face time and nonface-to-face time personally spent on review of chart (labs and imaging), discussing labs and goals, discussing further work-up, treatment options, referrals to specialist if needed, reviewing outside records of pertinent, answering patient's questions, and coordinating care.   Percell Locus, MS3   Patient seen along with MS3 student Percell Locus. I personally evaluated this patient along with the student, and verified all aspects of the history, physical exam, and medical decision making as documented by the student. I agree with the student's documentation and have made all necessary edits.  Tully Burgo, Dionne Bucy, MD, MPH [facility]

## 2020-11-19 NOTE — Assessment & Plan Note (Addendum)
-   FH of colon cancer in 1st degree relative - Refer for screening colonoscopy

## 2020-11-19 NOTE — Assessment & Plan Note (Signed)
-   Chronic and stable - Continue Olanzapine - Will continue to monitor

## 2020-11-20 ENCOUNTER — Other Ambulatory Visit: Payer: Self-pay

## 2020-11-20 ENCOUNTER — Encounter: Payer: Self-pay | Admitting: Family Medicine

## 2020-11-20 DIAGNOSIS — Z1211 Encounter for screening for malignant neoplasm of colon: Secondary | ICD-10-CM

## 2020-11-20 DIAGNOSIS — Z800 Family history of malignant neoplasm of digestive organs: Secondary | ICD-10-CM

## 2020-11-20 LAB — LIPID PANEL
Chol/HDL Ratio: 3.5 ratio (ref 0.0–4.4)
Cholesterol, Total: 280 mg/dL — ABNORMAL HIGH (ref 100–199)
HDL: 81 mg/dL (ref >39)
LDL Chol Calc (NIH): 174 mg/dL — ABNORMAL HIGH (ref 0–99)
Triglycerides: 140 mg/dL (ref 0–149)
VLDL Cholesterol Cal: 25 mg/dL (ref 5–40)

## 2020-11-20 LAB — COMPREHENSIVE METABOLIC PANEL
ALT: 46 IU/L — ABNORMAL HIGH (ref 0–32)
AST: 38 IU/L (ref 0–40)
Albumin/Globulin Ratio: 1.7 (ref 1.2–2.2)
Albumin: 4.7 g/dL (ref 3.8–4.8)
Alkaline Phosphatase: 100 IU/L (ref 44–121)
BUN/Creatinine Ratio: 25 (ref 12–28)
BUN: 22 mg/dL (ref 8–27)
Bilirubin Total: 0.2 mg/dL (ref 0.0–1.2)
CO2: 25 mmol/L (ref 20–29)
Calcium: 10.4 mg/dL — ABNORMAL HIGH (ref 8.7–10.3)
Chloride: 99 mmol/L (ref 96–106)
Creatinine, Ser: 0.88 mg/dL (ref 0.57–1.00)
Globulin, Total: 2.8 g/dL (ref 1.5–4.5)
Glucose: 101 mg/dL — ABNORMAL HIGH (ref 70–99)
Potassium: 5.3 mmol/L — ABNORMAL HIGH (ref 3.5–5.2)
Sodium: 140 mmol/L (ref 134–144)
Total Protein: 7.5 g/dL (ref 6.0–8.5)
eGFR: 72 mL/min/{1.73_m2} (ref >59)

## 2020-11-20 LAB — VITAMIN B12: Vitamin B-12: 389 pg/mL (ref 232–1245)

## 2020-11-20 LAB — TSH: TSH: 4.6 u[IU]/mL — ABNORMAL HIGH (ref 0.450–4.500)

## 2020-11-20 LAB — HEMOGLOBIN A1C
Est. average glucose Bld gHb Est-mCnc: 146 mg/dL
Hgb A1c MFr Bld: 6.7 % — ABNORMAL HIGH (ref 4.8–5.6)

## 2020-11-20 MED ORDER — NA SULFATE-K SULFATE-MG SULF 17.5-3.13-1.6 GM/177ML PO SOLN: 1.0000 | Freq: Once | ORAL | 0 refills | Status: AC

## 2020-11-20 NOTE — Telephone Encounter (Signed)
Spoke with pt and obtained verbal ROI. Forms and records faxed today. TNP

## 2020-11-20 NOTE — Telephone Encounter (Signed)
Form completed. Will give to [facility] to submit with attachments

## 2020-11-20 NOTE — Telephone Encounter (Signed)
When pt returns call please contact Flow line and ask to speak with Nichole. I need to process a verbal ROI with pt personally. Thanks-Nichole

## 2020-11-20 NOTE — Progress Notes (Signed)
Gastroenterology Pre-Procedure Review  Request Date: 11/27/20 Requesting Physician: Dr. Bonna Gains  PATIENT REVIEW QUESTIONS: The patient responded to the following health history questions as indicated:    1. Are you having any GI issues?  Sometimes diarrhea, cramping. 2. Do you have a personal history of Polyps? no 3. Do you have a family history of Colon Cancer or Polyps? yes (Mother colon cancer) 4. Diabetes Mellitus? yes (Type II) 5. Joint replacements in the past 12 months?no 6. Major health problems in the past 3 months?no 7. Any artificial heart valves, MVP, or defibrillator?no    MEDICATIONS & ALLERGIES:    Patient reports the following regarding taking any anticoagulation/antiplatelet therapy:   Plavix, Coumadin, Eliquis, Xarelto, Lovenox, Pradaxa, Brilinta, or Effient? no Aspirin? no  Patient confirms/reports the following medications:  Current Outpatient Medications  Medication Sig Dispense Refill   Alcohol Swabs (ALCOHOL PREP) PADS Use 1 pad prior to checking blood sugar to clean skin 100 each 3   b complex vitamins capsule Take 1 capsule by mouth daily.     blood glucose meter kit and supplies Dispense based on patient and insurance preference. Check glucose once daily. (FOR ICD-10 E11.29). 1 each 0   buPROPion (WELLBUTRIN XL) 300 MG 24 hr tablet Take 300 mg by mouth every morning.     chlorhexidine (PERIDEX) 0.12 % solution SMARTSIG:By Mouth     Cholecalciferol (VITAMIN D3) 10 MCG (400 UNIT) tablet Take by mouth daily. Dose unknown     dicyclomine (BENTYL) 10 MG capsule Take 1 capsule (10 mg total) by mouth 4 (four) times daily -  before meals and at bedtime. 360 capsule 3   estradiol (ESTRACE) 0.1 MG/GM vaginal cream Place 1 Applicatorful vaginally at bedtime. 42.5 g 12   FLUoxetine (PROZAC) 20 MG capsule Take 3 capsules (60 mg total) by mouth daily. 90 capsule 3   fluticasone (FLONASE SENSIMIST) 27.5 MCG/SPRAY nasal spray Place 1-2 sprays into the nose daily. 10 g 12    glipiZIDE (GLUCOTROL XL) 10 MG 24 hr tablet TAKE 1 TABLET(10 MG) BY MOUTH DAILY WITH BREAKFAST 90 tablet 3   Glucosamine-Chondroitin (MOVE FREE PO) Take 1 tablet by mouth daily.     ibuprofen (ADVIL,MOTRIN) 200 MG tablet Take 400 mg by mouth at bedtime. As needed     lovastatin (MEVACOR) 40 MG tablet TAKE 1 TABLET(40 MG) BY MOUTH AT BEDTIME 90 tablet 3   metFORMIN (GLUCOPHAGE) 1000 MG tablet TAKE 1 TABLET(1000 MG) BY MOUTH TWICE DAILY WITH A MEAL 180 tablet 3   Multiple Vitamins-Minerals (CENTRUM SILVER PO) Take 1 tablet by mouth daily.     OLANZapine (ZYPREXA) 2.5 MG tablet Take 2.5 mg by mouth daily as needed.     pioglitazone (ACTOS) 30 MG tablet TAKE 1 TABLET(30 MG) BY MOUTH DAILY 90 tablet 0   propranolol (INDERAL) 10 MG tablet TAKE 1 TABLET(10 MG) BY MOUTH TWICE DAILY 180 tablet 3   zinc gluconate 50 MG tablet Take by mouth daily. Dose unknown     No current facility-administered medications for this visit.    Patient confirms/reports the following allergies:  Allergies  Allergen Reactions   Penicillins Rash   Vesicare [Solifenacin] Anaphylaxis    Throat Closed    No orders of the defined types were placed in this encounter.   AUTHORIZATION INFORMATION Primary Insurance: 1D#: Group #:  Secondary Insurance: 1D#: Group #:  SCHEDULE INFORMATION: Date: 11/27/20 Time: Location: [place]

## 2020-11-23 LAB — SPECIMEN STATUS REPORT

## 2020-11-23 LAB — T4, FREE: Free T4: 1.34 ng/dL (ref 0.82–1.77)

## 2020-11-27 ENCOUNTER — Ambulatory Visit
Admission: RE | Admit: 2020-11-27 | Discharge: 2020-11-27 | Disposition: A | Discharge status: Home / Self Care | Payer: Medicare Other | Attending: Gastroenterology | Admitting: Gastroenterology

## 2020-11-27 ENCOUNTER — Ambulatory Visit: Payer: Medicare Other | Admitting: Anesthesiology

## 2020-11-27 ENCOUNTER — Encounter
Admission: RE | Disposition: A | Discharge status: Home / Self Care | Payer: Self-pay | Source: Home / Self Care | Attending: Gastroenterology

## 2020-11-27 ENCOUNTER — Encounter: Payer: Self-pay | Admitting: Gastroenterology

## 2020-11-27 DIAGNOSIS — E669 Obesity, unspecified: Secondary | ICD-10-CM | POA: Insufficient documentation

## 2020-11-27 DIAGNOSIS — Z803 Family history of malignant neoplasm of breast: Secondary | ICD-10-CM | POA: Diagnosis not present

## 2020-11-27 DIAGNOSIS — Z7984 Long term (current) use of oral hypoglycemic drugs: Secondary | ICD-10-CM | POA: Insufficient documentation

## 2020-11-27 DIAGNOSIS — K621 Rectal polyp: Secondary | ICD-10-CM | POA: Diagnosis not present

## 2020-11-27 DIAGNOSIS — Z6832 Body mass index (BMI) 32.0-32.9, adult: Secondary | ICD-10-CM | POA: Diagnosis not present

## 2020-11-27 DIAGNOSIS — K635 Polyp of colon: Secondary | ICD-10-CM

## 2020-11-27 DIAGNOSIS — E119 Type 2 diabetes mellitus without complications: Secondary | ICD-10-CM | POA: Insufficient documentation

## 2020-11-27 DIAGNOSIS — Z800 Family history of malignant neoplasm of digestive organs: Secondary | ICD-10-CM | POA: Diagnosis not present

## 2020-11-27 DIAGNOSIS — K5730 Diverticulosis of large intestine without perforation or abscess without bleeding: Secondary | ICD-10-CM | POA: Insufficient documentation

## 2020-11-27 DIAGNOSIS — D120 Benign neoplasm of cecum: Secondary | ICD-10-CM | POA: Diagnosis not present

## 2020-11-27 DIAGNOSIS — Z1211 Encounter for screening for malignant neoplasm of colon: Secondary | ICD-10-CM | POA: Diagnosis not present

## 2020-11-27 HISTORY — DX: Dyspnea, unspecified: R06.00

## 2020-11-27 HISTORY — PX: COLONOSCOPY WITH PROPOFOL: SHX5780

## 2020-11-27 LAB — GLUCOSE, CAPILLARY: Glucose-Capillary: 156 mg/dL — ABNORMAL HIGH (ref 70–99)

## 2020-11-27 SURGERY — COLONOSCOPY WITH PROPOFOL
Anesthesia: General

## 2020-11-27 MED ORDER — PROPOFOL 500 MG/50ML IV EMUL
INTRAVENOUS | Status: AC
  Filled 2020-11-27: qty 50

## 2020-11-27 MED ORDER — PROPOFOL 10 MG/ML IV BOLUS
INTRAVENOUS | Status: DC | PRN
  Administered 2020-11-27: 10 mg via INTRAVENOUS
  Administered 2020-11-27: 80 mg via INTRAVENOUS
  Administered 2020-11-27: 10 mg via INTRAVENOUS

## 2020-11-27 MED ORDER — PROPOFOL 500 MG/50ML IV EMUL
INTRAVENOUS | Status: DC | PRN
  Administered 2020-11-27: 150 ug/kg/min via INTRAVENOUS

## 2020-11-27 MED ORDER — LIDOCAINE HCL (CARDIAC) PF 100 MG/5ML IV SOSY
PREFILLED_SYRINGE | INTRAVENOUS | Status: DC | PRN
  Administered 2020-11-27: 40 mg via INTRAVENOUS

## 2020-11-27 MED ORDER — SODIUM CHLORIDE 0.9 % IV SOLN: INTRAVENOUS | Status: DC

## 2020-11-27 NOTE — H&P (Addendum)
[name], MD [address], [place], [place], [place], [address] [id number], [place], [place], [place], [address] Phone: [phone]  Fax: [phone]  Primary Care Physician:  [name], FNP   Pre-Procedure History & Physical: HPI:  Melissa Arroyo is a 70 y.o. female is here for a colonoscopy.   Past Medical History:  Diagnosis Date   Anxiety    Depression    Diabetes mellitus ([place])    Dyspnea    Heart attack ([place]) 04/11/2007   Heart attack ([place])    Hyperlipidemia    Hypertension    Obesity    Recurrent UTI    Renal lesion    Urinary incontinence in female    Vaginal atrophy     Past Surgical History:  Procedure Laterality Date   BUNIONECTOMY  2001   CARDIAC CATHETERIZATION     DENTAL SURGERY     NECK SURGERY     ROTATOR CUFF REPAIR Left     Prior to Admission medications   Medication Sig Start Date End Date Taking? Authorizing Provider  b complex vitamins capsule Take 1 capsule by mouth daily.   Yes [provider]  buPROPion (WELLBUTRIN XL) 300 MG 24 hr tablet Take 300 mg by mouth every morning. 10/06/19  Yes [provider]  FLUoxetine (PROZAC) 20 MG capsule Take 3 capsules (60 mg total) by mouth daily. 11/19/20  Yes Bacigalupo, Dionne Bucy, MD  glipiZIDE (GLUCOTROL XL) 10 MG 24 hr tablet TAKE 1 TABLET(10 MG) BY MOUTH DAILY WITH BREAKFAST 05/14/20  Yes Fenton Malling M, PA-C  metFORMIN (GLUCOPHAGE) 1000 MG tablet TAKE 1 TABLET(1000 MG) BY MOUTH TWICE DAILY WITH A MEAL 05/14/20  Yes Fenton Malling M, PA-C  OLANZapine (ZYPREXA) 2.5 MG tablet Take 2.5 mg by mouth daily as needed. 05/09/19  Yes [provider]  pioglitazone (ACTOS) 30 MG tablet TAKE 1 TABLET(30 MG) BY MOUTH DAILY 08/08/20  Yes Bacigalupo, Dionne Bucy, MD  propranolol (INDERAL) 10 MG tablet TAKE 1 TABLET(10 MG) BY MOUTH TWICE DAILY 05/14/20  Yes Mar Daring, PA-C  Alcohol Swabs (ALCOHOL PREP) PADS Use 1 pad prior to checking blood sugar to clean skin  09/27/15   Mar Daring, PA-C  blood glucose meter kit and supplies Dispense based on patient and insurance preference. Check glucose once daily. (FOR ICD-10 E11.29). 10/26/20   Virginia Crews, MD  chlorhexidine (PERIDEX) 0.12 % solution SMARTSIG:By Mouth 09/15/19   [provider]  Cholecalciferol (VITAMIN D3) 10 MCG (400 UNIT) tablet Take by mouth daily. Dose unknown    [provider]  dicyclomine (BENTYL) 10 MG capsule Take 1 capsule (10 mg total) by mouth 4 (four) times daily -  before meals and at bedtime. 05/14/20   Mar Daring, PA-C  estradiol (ESTRACE) 0.1 MG/GM vaginal cream Place 1 Applicatorful vaginally at bedtime. 05/14/20   Mar Daring, PA-C  fluticasone (FLONASE SENSIMIST) 27.5 MCG/SPRAY nasal spray Place 1-2 sprays into the nose daily. 05/14/20   Mar Daring, PA-C  Glucosamine-Chondroitin (MOVE FREE PO) Take 1 tablet by mouth daily.    [provider]  ibuprofen (ADVIL,MOTRIN) 200 MG tablet Take 400 mg by mouth at bedtime. As needed    [provider]  lovastatin (MEVACOR) 40 MG tablet TAKE 1 TABLET(40 MG) BY MOUTH AT BEDTIME 05/14/20   Mar Daring, PA-C  Multiple Vitamins-Minerals (CENTRUM SILVER PO) Take 1 tablet by mouth daily.    [provider]  zinc gluconate 50 MG  tablet Take by mouth daily. Dose unknown    [provider]    Allergies as of 11/20/2020 - Review Complete 11/19/2020  Allergen Reaction Noted   Penicillins Rash 07/05/2014   Vesicare [solifenacin] Anaphylaxis 07/20/2014    Family History  Problem Relation Age of Onset   Hypertension Mother    Diabetes Mother    Leukemia Mother    Colon cancer Mother    Rheum arthritis Father    Lymphoma Father    Healthy Sister    Uterine cancer Maternal Grandmother    Hypertension Brother    Heart disease Brother        Psychologist, forensic   Breast cancer Other        great aunt breast cancer   Cancer Other         Bladder,Kidney,Prostate    Social History   Socioeconomic History   Marital status: Married    Spouse name: Not on file   Number of children: 0   Years of education: Not on file   Highest education level: Bachelor's degree (e.g., BA, AB, BS)  Occupational History    Employer: [employer]    Comment: full time  Tobacco Use   Smoking status: Never   Smokeless tobacco: Never  Vaping Use   Vaping Use: Never used  Substance and Sexual Activity   Alcohol use: No    Alcohol/week: 0.0 standard drinks    Comment: Quit in 04/24/1993   Drug use: No   Sexual activity: Yes  Other Topics Concern   Not on file  Social History Narrative   Not on file   Social Determinants of Health   Financial Resource Strain: Not on file  Food Insecurity: No Food Insecurity   Worried About Estate manager/land agent of Food in the Last Year: Never true   Ran Out of Food in the Last Year: Never true  Transportation Needs: No Transportation Needs   Lack of Transportation (Medical): No   Lack of Transportation (Non-Medical): No  Physical Activity: Not on file  Stress: Not on file  Social Connections: Not on file  Intimate Partner Violence: Not on file    Review of Systems: See HPI, otherwise negative ROS  Physical Exam: Constitutional: General:   Alert,  Well-developed, well-nourished, pleasant and cooperative in NAD BP (!) 161/67   Pulse 69   Temp (!) 96.1 F (35.6 C) (Temporal)   Resp 18   Ht '5\' 5"'  (1.651 m)   Wt 89.4 kg   SpO2 100%   BMI 32.78 kg/m   Head: Normocephalic, atraumatic.   Eyes:  Sclera clear, no icterus.   Conjunctiva pink.   Mouth:  No deformity or lesions, oropharynx pink & moist.  Neck:  Supple, trachea midline  Respiratory: Normal respiratory effort  Gastrointestinal:  Soft, non-tender and non-distended without masses, hepatosplenomegaly or hernias noted.  No guarding or rebound tenderness.     Cardiac: No clubbing or edema.  No cyanosis. Normal posterior tibial pedal pulses  noted.  Lymphatic:  No significant cervical adenopathy.  Psych:  Alert and cooperative. Normal mood and affect.  Musculoskeletal:   Symmetrical without gross deformities. 5/5 Lower extremity strength bilaterally.  Skin: Warm. Intact without significant lesions or rashes. No jaundice.  Neurologic:  Face symmetrical, tongue midline, Normal sensation to touch;  grossly normal neurologically.  Psych:  Alert and oriented x3, Alert and cooperative. Normal mood and affect.  Impression/Plan: Melissa Arroyo is here for a colonoscopy to be performed for family history  of colon cancer  Risks, benefits, limitations, and alternatives regarding  colonoscopy have been reviewed with the patient.  Questions have been answered.  All parties agreeable.   Virgel Manifold, MD  11/27/2020, 9:45 AM

## 2020-11-27 NOTE — Transfer of Care (Signed)
Immediate Anesthesia Transfer of Care Note  Patient: Melissa Arroyo  Procedure(s) Performed: Procedure(s): COLONOSCOPY WITH PROPOFOL (N/A)  Patient Location: PACU and Endoscopy Unit  Anesthesia Type:General  Level of Consciousness: sedated  Airway & Oxygen Therapy: Patient Spontanous Breathing and Patient connected to nasal cannula oxygen  Post-op Assessment: Report given to RN and Post -op Vital signs reviewed and stable  Post vital signs: Reviewed and stable  Last Vitals:  Vitals:   11/27/20 0847 11/27/20 1018  BP: (!) 161/67 (!) 119/59  Pulse: 69 63  Resp: 18 13  Temp: (!) 35.6 C   SpO2: 235% 57%    Complications: No apparent anesthesia complications

## 2020-11-27 NOTE — Op Note (Signed)
[facility]astroenterology Patient Name: Melissa Arroyo Procedure Date: 11/27/2020 9:29 AM MRN: [id number] Account #: [id number] Date of Birth: 12/23/1950 Admit Type: Outpatient Age: 70 Room: [facility] ENDO ROOM 3 Gender: Female Note Status: Finalized Instrument Name: Park Meo [id number] Procedure:             Colonoscopy Indications:           Screening in patient at increased risk: Family history                         of 1st-degree relative with colorectal cancer Providers:             Garrette Caine B. Bonna Gains MD, MD Referring MD:          Edison Nasuti Medicines:             Monitored Anesthesia Care Complications:         No immediate complications. Procedure:             Pre-Anesthesia Assessment:                        - ASA Grade Assessment: II - A patient with mild                         systemic disease.                        - Prior to the procedure, a History and Physical was                         performed, and patient medications, allergies and                         sensitivities were reviewed. The patient's tolerance                         of previous anesthesia was reviewed.                        - The risks and benefits of the procedure and the                         sedation options and risks were discussed with the                         patient. All questions were answered and informed                         consent was obtained.                        - Patient identification and proposed procedure were                         verified prior to the procedure by the physician, the                         nurse, the anesthesiologist, the anesthetist and the  technician. The procedure was verified in the                         procedure room.                        After obtaining informed consent, the colonoscope was                         passed under direct vision. Throughout the procedure,                          the patient's blood pressure, pulse, and oxygen                         saturations were monitored continuously. The                         Colonoscope was introduced through the anus and                         advanced to the the cecum, identified by appendiceal                         orifice and ileocecal valve. The colonoscopy was                         performed with ease. The patient tolerated the                         procedure well. The quality of the bowel preparation                         was good. Findings:      The perianal and digital rectal examinations were normal.      Two sessile polyps were found in the rectum and cecum. The polyps were 4       to 5 mm in size. These polyps were removed with a cold snare. Resection       and retrieval were complete.      Multiple diverticula were found in the sigmoid colon.      The exam was otherwise without abnormality.      The rectum, sigmoid colon, descending colon, transverse colon, ascending       colon and cecum appeared normal.      Anal papilla(e) were hypertrophied.      No additional abnormalities were found on retroflexion. Impression:            - Two 4 to 5 mm polyps in the rectum and in the cecum,                         removed with a cold snare. Resected and retrieved.                        - Diverticulosis in the sigmoid colon.                        - The examination was otherwise normal.                        -  The rectum, sigmoid colon, descending colon,                         transverse colon, ascending colon and cecum are normal.                        - Anal papilla(e) were hypertrophied. Recommendation:        - Discharge patient to home (with escort).                        - High fiber diet.                        - Advance diet as tolerated.                        - Continue present medications.                        - Await pathology results.                        - Repeat colonoscopy in 5  years.                        - The findings and recommendations were discussed with                         the patient.                        - The findings and recommendations were discussed with                         the patient's family.                        - Return to primary care physician as previously                         scheduled. Procedure Code(s):     --- Professional ---                        [phone], Colonoscopy, flexible; with removal of                         tumor(s), polyp(s), or other lesion(s) by snare                         technique Diagnosis Code(s):     --- Professional ---                        Z80.0, Family history of malignant neoplasm of                         digestive organs                        K62.1, Rectal polyp                        K63.5,  Polyp of colon CPT copyright 2019 American Medical Association. All rights reserved. The codes documented in this report are preliminary and upon coder review may  be revised to meet current compliance requirements.  [name], MD Margretta Sidle B. Bonna Gains MD, MD 11/27/2020 10:22:03 AM This report has been signed electronically. Number of Addenda: 0 Note Initiated On: 11/27/2020 9:29 AM Scope Withdrawal Time: 0 hours 18 minutes 38 seconds  Total Procedure Duration: 0 hours 24 minutes 7 seconds  Estimated Blood Loss:  Estimated blood loss: none.      [facility]

## 2020-11-27 NOTE — Anesthesia Procedure Notes (Signed)
Date/Time: 11/27/2020 9:39 AM Performed by: Doreen Salvage, CRNA Pre-anesthesia Checklist: Patient identified, Emergency Drugs available, Suction available and Patient being monitored Patient Re-evaluated:Patient Re-evaluated prior to induction Oxygen Delivery Method: Nasal cannula Induction Type: IV induction Dental Injury: Teeth and Oropharynx as per pre-operative assessment  Comments: Nasal cannula with etCO2 monitoring

## 2020-11-27 NOTE — Anesthesia Preprocedure Evaluation (Signed)
Anesthesia Evaluation  Patient identified by MRN, date of birth, ID band Patient awake    Reviewed: Allergy & Precautions, NPO status , Patient's Chart, lab work & pertinent test results  History of Anesthesia Complications Negative for: history of anesthetic complications  Airway Mallampati: III   Neck ROM: Full    Dental  (+)    Pulmonary neg pulmonary ROS,    Pulmonary exam normal breath sounds clear to auscultation       Cardiovascular hypertension, + CAD (s/p MI 2009)  Normal cardiovascular exam Rhythm:Regular Rate:Normal  Stress echo 07/15/17: normal   Neuro/Psych PSYCHIATRIC DISORDERS Anxiety Depression negative neurological ROS     GI/Hepatic negative GI ROS,   Endo/Other  diabetes, Type 2Obesity   Renal/GU negative Renal ROS     Musculoskeletal   Abdominal   Peds  Hematology negative hematology ROS (+)   Anesthesia Other Findings   Reproductive/Obstetrics                             Anesthesia Physical Anesthesia Plan  ASA: 2  Anesthesia Plan: General   Post-op Pain Management:    Induction: Intravenous  PONV Risk Score and Plan: 3 and Propofol infusion, TIVA and Treatment may vary due to age or medical condition  Airway Management Planned: Natural Airway  Additional Equipment:   Intra-op Plan:   Post-operative Plan:   Informed Consent: I have reviewed the patients History and Physical, chart, labs and discussed the procedure including the risks, benefits and alternatives for the proposed anesthesia with the patient or authorized representative who has indicated his/her understanding and acceptance.       Plan Discussed with: CRNA  Anesthesia Plan Comments:         Anesthesia Quick Evaluation

## 2020-11-27 NOTE — Anesthesia Postprocedure Evaluation (Signed)
Anesthesia Post Note  Patient: Melissa Arroyo  Procedure(s) Performed: COLONOSCOPY WITH PROPOFOL  Patient location during evaluation: PACU Anesthesia Type: General Level of consciousness: awake and alert, oriented and patient cooperative Pain management: pain level controlled Vital Signs Assessment: post-procedure vital signs reviewed and stable Respiratory status: spontaneous breathing, nonlabored ventilation and respiratory function stable Cardiovascular status: blood pressure returned to baseline and stable Postop Assessment: adequate PO intake Anesthetic complications: no   No notable events documented.   Last Vitals:  Vitals:   11/27/20 1018 11/27/20 1043  BP: (!) 119/59 (!) 145/71  Pulse: 63   Resp: 13   Temp:  (!) 35.6 C  SpO2: 98%     Last Pain:  Vitals:   11/27/20 1043  TempSrc: Temporal  PainSc:                  Darrin Nipper

## 2020-11-28 ENCOUNTER — Encounter: Payer: Self-pay | Admitting: Gastroenterology

## 2020-11-28 LAB — SURGICAL PATHOLOGY

## 2020-11-29 ENCOUNTER — Encounter: Payer: Self-pay | Admitting: Gastroenterology

## 2020-12-01 DIAGNOSIS — Z23 Encounter for immunization: Secondary | ICD-10-CM | POA: Diagnosis not present

## 2020-12-03 ENCOUNTER — Encounter: Payer: Self-pay | Admitting: Family Medicine

## 2020-12-03 ENCOUNTER — Ambulatory Visit (INDEPENDENT_AMBULATORY_CARE_PROVIDER_SITE_OTHER): Payer: Medicare Other | Admitting: Family Medicine

## 2020-12-03 ENCOUNTER — Telehealth: Payer: Self-pay

## 2020-12-03 ENCOUNTER — Other Ambulatory Visit: Payer: Self-pay

## 2020-12-03 VITALS — BP 124/70 | HR 62 | Temp 97.8°F | Resp 16 | Wt 201.0 lb

## 2020-12-03 DIAGNOSIS — E1169 Type 2 diabetes mellitus with other specified complication: Secondary | ICD-10-CM

## 2020-12-03 DIAGNOSIS — F4001 Agoraphobia with panic disorder: Secondary | ICD-10-CM

## 2020-12-03 DIAGNOSIS — I10 Essential (primary) hypertension: Secondary | ICD-10-CM

## 2020-12-03 DIAGNOSIS — E785 Hyperlipidemia, unspecified: Secondary | ICD-10-CM

## 2020-12-03 DIAGNOSIS — F411 Generalized anxiety disorder: Secondary | ICD-10-CM | POA: Diagnosis not present

## 2020-12-03 DIAGNOSIS — F339 Major depressive disorder, recurrent, unspecified: Secondary | ICD-10-CM

## 2020-12-03 NOTE — Assessment & Plan Note (Signed)
Decreased frequency, but still occurring occasionally Continue prozac

## 2020-12-03 NOTE — Assessment & Plan Note (Signed)
Good improvement from last time No changes today

## 2020-12-03 NOTE — Assessment & Plan Note (Signed)
Chronic and stable Possibly contributing to brain fog Doing better on higher prozac dose - will continue No changes on Wellbutrin and Zyprexa - may consider different atypical antipsychotic with less metabolic syndrome side effects in the future in the setting of her HTN, HLD, T2DM Upcoming appts with psych and neuropsych

## 2020-12-03 NOTE — Telephone Encounter (Signed)
Copied from [place] [phone]. Topic: General - Other >> Dec 03, 2020 10:36 AM Rayann Heman wrote: Reason for CRM: Pt called and asked if we would send last OV notes and return to work letter via fax to [employer]  Fax# [phone]

## 2020-12-03 NOTE — Assessment & Plan Note (Signed)
Reviewed last lipid panel

## 2020-12-03 NOTE — Progress Notes (Signed)
Established patient visit   Patient: Melissa Arroyo   DOB: April 25, 1950   70 y.o. Female  MRN: [id number] Visit Date: 12/03/2020  Today's healthcare provider: Lavon Paganini, MD   Chief Complaint  Patient presents with   Follow-up   Anxiety   Depression    Subjective    HPI  Depression, Follow-up  She  was last seen for this 2 weeks ago. Changes made at last visit include increase Prozac to 60 mg daily, continue Wellbutrin and Olanzapine, refer to psychiatry. Appt with psychiatry 12/27/20 and neuropsychiatry 12/13/20.   She reports excellent compliance with treatment. She is not having side effects.   She reports excellent tolerance of treatment. Current symptoms include: depressed mood, difficulty concentrating, hypersomnia, and impaired memory She feels she is Unchanged since last visit.  Still having some brain fog Sleeping well Taking olanzapine 7.91m daily  [facility] 12/03/2020 11/19/2020 11/13/2020  Decreased Interest _0 Down, Depressed, Hopeless 1 1 0  PHQ - 2 Score _1 Altered sleeping 0 1 -  Tired, decreased energy 1 1 -  Change in appetite 0 0 -  Feeling bad or failure about yourself  0 1 -  Trouble concentrating 2 1 -  Moving slowly or fidgety/restless 1 2 -  Suicidal thoughts 0 0 -  PHQ-9 Score 6 9 -  Difficult doing work/chores Somewhat difficult Extremely dIfficult -  Some recent data might be hidden    ----------------------------------------------------------------------------------------- Follow up for Attention and concentration deficit  The patient was last seen for this 2 weeks ago. Changes made at last visit include D/C Adderall due to adverse effects.  She reports excellent compliance with treatment. She feels that condition is Unchanged. She is not having side effects.   ----------------------------------------------------------------------------------------- Follow up for panic attacks/GAD  The  patient was last seen for this 2 weeks ago. Changes made at last visit include D/C hydroxyzine due to interaction with Olanzapine, continue and increase Prozac to 60 mg daily.  She reports excellent compliance with treatment. She feels that condition is Improved. She is not having side effects.   -----------------------------------------------------------------------------------------     Medications: Outpatient Medications Prior to Visit  Medication Sig   Alcohol Swabs (ALCOHOL PREP) PADS Use 1 pad prior to checking blood sugar to clean skin   b complex vitamins capsule Take 1 capsule by mouth daily.   blood glucose meter kit and supplies Dispense based on patient and insurance preference. Check glucose once daily. (FOR ICD-10 E11.29).   buPROPion (WELLBUTRIN XL) 300 MG 24 hr tablet Take 300 mg by mouth every morning.   chlorhexidine (PERIDEX) 0.12 % solution SMARTSIG:By Mouth   Cholecalciferol (VITAMIN D3) 10 MCG (400 UNIT) tablet Take by mouth daily. Dose unknown   dicyclomine (BENTYL) 10 MG capsule Take 1 capsule (10 mg total) by mouth 4 (four) times daily -  before meals and at bedtime.   estradiol (ESTRACE) 0.1 MG/GM vaginal cream Place 1 Applicatorful vaginally at bedtime.   FLUoxetine (PROZAC) 20 MG capsule Take 3 capsules (60 mg total) by mouth daily.   fluticasone (FLONASE SENSIMIST) 27.5 MCG/SPRAY nasal spray Place 1-2 sprays into the nose daily.   glipiZIDE (GLUCOTROL XL) 10 MG 24 hr tablet TAKE 1 TABLET(10 MG) BY MOUTH DAILY WITH BREAKFAST   Glucosamine-Chondroitin (MOVE FREE PO) Take 1 tablet by mouth daily.   ibuprofen (ADVIL,MOTRIN) 200 MG tablet Take 400 mg by mouth at bedtime. As needed   lovastatin (  MEVACOR) 40 MG tablet TAKE 1 TABLET(40 MG) BY MOUTH AT BEDTIME   metFORMIN (GLUCOPHAGE) 1000 MG tablet TAKE 1 TABLET(1000 MG) BY MOUTH TWICE DAILY WITH A MEAL   Multiple Vitamins-Minerals (CENTRUM SILVER PO) Take 1 tablet by mouth daily.   OLANZapine (ZYPREXA) 2.5 MG  tablet Take 2.5 mg by mouth daily as needed.   pioglitazone (ACTOS) 30 MG tablet TAKE 1 TABLET(30 MG) BY MOUTH DAILY   propranolol (INDERAL) 10 MG tablet TAKE 1 TABLET(10 MG) BY MOUTH TWICE DAILY   zinc gluconate 50 MG tablet Take by mouth daily. Dose unknown   No facility-administered medications prior to visit.    Review of Systems  Constitutional:  Positive for fatigue. Negative for activity change and appetite change.  Respiratory:  Negative for chest tightness and shortness of breath.   Cardiovascular:  Negative for chest pain and palpitations.  Psychiatric/Behavioral:  Positive for confusion, decreased concentration and sleep disturbance. The patient is nervous/anxious.        Objective    BP 124/70 (BP Location: Left Arm, Patient Position: Sitting, Cuff Size: Large)   Pulse 62   Temp 97.8 F (36.6 C) (Temporal)   Resp 16   Wt 201 lb (91.2 kg)   SpO2 97%   BMI 33.45 kg/m  BP Readings from Last 3 Encounters:  12/03/20 124/70  11/27/20 (!) 145/71  11/19/20 (!) 135/58   Wt Readings from Last 3 Encounters:  12/03/20 201 lb (91.2 kg)  11/27/20 197 lb (89.4 kg)  11/19/20 202 lb 4.8 oz (91.8 kg)      Physical Exam Vitals reviewed.  Constitutional:      General: She is not in acute distress.    Appearance: Normal appearance. She is well-developed. She is not diaphoretic.  HENT:     Head: Normocephalic and atraumatic.  Eyes:     General: No scleral icterus.    Conjunctiva/sclera: Conjunctivae normal.  Neck:     Thyroid: No thyromegaly.  Cardiovascular:     Rate and Rhythm: Normal rate and regular rhythm.     Pulses: Normal pulses.     Heart sounds: Normal heart sounds. No murmur heard. Pulmonary:     Effort: Pulmonary effort is normal. No respiratory distress.     Breath sounds: Normal breath sounds. No wheezing, rhonchi or rales.  Musculoskeletal:     Cervical back: Neck supple.     Right lower leg: No edema.     Left lower leg: No edema.  Lymphadenopathy:      Cervical: No cervical adenopathy.  Skin:    General: Skin is warm and dry.     Findings: No rash.  Neurological:     Mental Status: She is alert and oriented to person, place, and time. Mental status is at baseline.  Psychiatric:        Mood and Affect: Mood is depressed. Affect is flat.        Behavior: Behavior normal.      No results found for any visits on 12/03/20.  Assessment & Plan     Problem List Items Addressed This Visit       Cardiovascular and Mediastinum   Benign essential HTN - Primary    Good improvement from last time No changes today        Endocrine   Hyperlipidemia associated with type 2 diabetes mellitus ([place])    Reviewed last lipid panel        Other   Depression, recurrent ([place])    Chronic  and stable Possibly contributing to brain fog Doing better on higher prozac dose - will continue No changes on Wellbutrin and Zyprexa - may consider different atypical antipsychotic with less metabolic syndrome side effects in the future in the setting of her HTN, HLD, T2DM Upcoming appts with psych and neuropsych      Panic disorder with agoraphobia and mild panic attacks    Decreased frequency, but still occurring occasionally Continue prozac      GAD (generalized anxiety disorder)    Chronic and stable Possibly contributing to brain fog Doing better on higher prozac dose - will continue No changes on Wellbutrin and Zyprexa - may consider different atypical antipsychotic with less metabolic syndrome side effects in the future in the setting of her HTN, HLD, T2DM Upcoming appts with psych and neuropsych        Return in about 3 months (around 03/05/2021) for CPE, With new PCP, as scheduled.      I, Lavon Paganini, MD, have reviewed all documentation for this visit. The documentation on 12/03/20 for the exam, diagnosis, procedures, and orders are all accurate and complete.   Avalyn Molino, Dionne Bucy, MD, MPH [facility]

## 2020-12-04 NOTE — Telephone Encounter (Signed)
Ok to send as requested

## 2020-12-04 NOTE — Telephone Encounter (Signed)
Done. TNP 

## 2020-12-10 DIAGNOSIS — R809 Proteinuria, unspecified: Secondary | ICD-10-CM

## 2020-12-10 DIAGNOSIS — I10 Essential (primary) hypertension: Secondary | ICD-10-CM | POA: Diagnosis not present

## 2020-12-10 DIAGNOSIS — E1129 Type 2 diabetes mellitus with other diabetic kidney complication: Secondary | ICD-10-CM | POA: Diagnosis not present

## 2020-12-11 ENCOUNTER — Ambulatory Visit (INDEPENDENT_AMBULATORY_CARE_PROVIDER_SITE_OTHER): Payer: Medicare Other

## 2020-12-11 DIAGNOSIS — E785 Hyperlipidemia, unspecified: Secondary | ICD-10-CM

## 2020-12-11 DIAGNOSIS — I10 Essential (primary) hypertension: Secondary | ICD-10-CM

## 2020-12-11 DIAGNOSIS — E1169 Type 2 diabetes mellitus with other specified complication: Secondary | ICD-10-CM

## 2020-12-11 DIAGNOSIS — E1129 Type 2 diabetes mellitus with other diabetic kidney complication: Secondary | ICD-10-CM

## 2020-12-11 NOTE — Patient Instructions (Addendum)
Thank you for allowing the Chronic Care Management team to participate in your care.     Patient Care Plan: Coronary Artery Disease, HTN, HLD     Problem Identified: CAD, HTN, HLD      Long-Range Goal: Disease Progression Prevented or Minimized   Start Date: 11/13/2020  Expected End Date: 02/11/2021  Priority: High  Note:   Objective:  Last practice recorded BP readings:  BP Readings from Last 3 Encounters:  11/09/20 (!) 155/70  05/14/20 (!) 147/81  08/08/19 (!) 160/78   Most recent eGFR/CrCl:  Lab Results  Component Value Date   EGFR 76 05/15/2020    No components found for: CRCL  Lab Results  Component Value Date   CHOL 181 05/15/2020   HDL 75 05/15/2020   LDLCALC 88 05/15/2020   TRIG 100 05/15/2020   CHOLHDL 2.3 04/18/2019    Current Barriers:  Chronic Disease Management support and educational needs related to HTN, HLD and CAD.  Case Manager Clinical Goal(s):  Over the next 90 days, patient will demonstrate improved adherence to prescribed treatment plan as evidenced by taking all medications as prescribed, monitoring and recording blood pressure and adhering to a low sodium/DASH diet.   Interventions:  Collaboration with [name], FNP regarding development and update of comprehensive plan of care as evidenced by provider attestation and co-signature Inter-disciplinary care team collaboration (see longitudinal plan of care) Reviewed medications and discussed compliance with treatment plan. Reports taking all medications as prescribed. Reviewed established BP readings and indications for notifying a provider. Reports BP readings have been within range. Reports attempting to increase activity/walking but continues to experience fatigue and pain.  Reviewed nutritional intake. Advised to continue monitoring sodium intake and avoid highly processed foods when possible.  Reviewed s/sx of heart attack, stroke and worsening symptoms that require immediate medical  attention.  Patient Goals/Self-Care Activities: Self-administer medications as prescribed Monitor and record blood pressure Adhere to recommended cardiac prudent/heart healthy diet Notify provider or care management team with questions and new concerns as needed    Patient Care Plan: Diabetes Type 2 (Adult)     Problem Identified: Glycemic Management (Diabetes, Type 2)      Goal: Glycemic Management Optimized   Start Date: 11/13/2020  Expected End Date: 02/11/2021  Priority: High  Note:   Objective:  Lab Results  Component Value Date   HGBA1C 7.1 (H) 05/15/2020   Lab Results  Component Value Date   CREATININE 0.84 05/15/2020   CREATININE 0.82 04/18/2019   CREATININE 0.80 04/04/2019   Lab Results  Component Value Date   EGFR 76 05/15/2020   Current Barriers:  Chronic Disease Management support and educational needs related to Diabetes.  Case Manager Clinical Goal(s):  Over the next 90 days, patient will demonstrate improved adherence to prescribed treatment plan for diabetes self care/management as evidenced by taking all medications as prescribed,  daily monitoring and recording of CBG's and adherence to a ADA/ carb modified diet.  Interventions:  Collaboration with [name], FNP regarding development and update of comprehensive plan of care as evidenced by provider attestation and co-signature Inter-disciplinary care team collaboration (see longitudinal plan of care) Reviewed compliance with treatment plan. Reports not monitoring blood glucose consistently over the past week. Denies s/sx of hypoglycemia and hyperglycemia. She continues to experience fatigue almost daily. Advised to monitor blood glucose levels and record readings.  Reviewed nutritional intake and importance of complying with an ADA/carb modified diet. Advised to consume full meals when able  to tolerate and continue monitoring intake of carbohydrates. Advised to avoid concentrated sugars when possible.  Strongly advise to ensure a quick acting sugar is readily available to consume in the event she experiences sx of hypoglycemia.   Patient Goals/Self-Care Activities Self-administer medications as prescribed Attend all scheduled provider appointments Monitor blood glucose levels consistently and utilize recommended interventions Adhere to prescribed ADA/carb modified Notify provider or care management team with questions and new concerns as needed     Ms. Minturn verbalized understanding of the information discussed during the telephonic outreach. Declined need for mailed/printed instructions. A member of the care management team will follow up within the next month.  Cristy Friedlander Heath/THN Care Management [facility] [phone]

## 2020-12-11 NOTE — Chronic Care Management (AMB) (Signed)
Chronic Care Management   CCM RN Visit Note  12/11/2020 Name: Melissa Arroyo MRN: [id number] DOB: 1951-02-06  Subjective: Melissa Arroyo is a 70 y.o. year old female who is a primary care patient of Melissa Sprout, FNP. The care management team was consulted for assistance with disease management and care coordination needs.    Engaged with patient by telephone for follow up visit in response to provider referral for case management and care coordination services.   Consent to Services:  The patient was given information about Chronic Care Management services, agreed to services, and gave verbal consent prior to initiation of services.  Please see initial visit note for detailed documentation.    Assessment: Review of patient past medical history, allergies, medications, health status, including review of consultants reports, laboratory and other test data, was performed as part of comprehensive evaluation and provision of chronic care management services.   SDOH (Social Determinants of Health) assessments and interventions performed: No  CCM Care Plan  Allergies  Allergen Reactions   Vesicare [Solifenacin] Anaphylaxis    Throat Closed   Penicillins Rash    Outpatient Encounter Medications as of 12/11/2020  Medication Sig   Alcohol Swabs (ALCOHOL PREP) PADS Use 1 pad prior to checking blood sugar to clean skin   b complex vitamins capsule Take 1 capsule by mouth daily.   blood glucose meter kit and supplies Dispense based on patient and insurance preference. Check glucose once daily. (FOR ICD-10 E11.29).   buPROPion (WELLBUTRIN XL) 300 MG 24 hr tablet Take 300 mg by mouth every morning.   chlorhexidine (PERIDEX) 0.12 % solution SMARTSIG:By Mouth   Cholecalciferol (VITAMIN D3) 10 MCG (400 UNIT) tablet Take by mouth daily. Dose unknown   dicyclomine (BENTYL) 10 MG capsule Take 1 capsule (10 mg total) by mouth 4 (four) times daily -  before meals and at bedtime.   estradiol  (ESTRACE) 0.1 MG/GM vaginal cream Place 1 Applicatorful vaginally at bedtime.   FLUoxetine (PROZAC) 20 MG capsule Take 3 capsules (60 mg total) by mouth daily.   fluticasone (FLONASE SENSIMIST) 27.5 MCG/SPRAY nasal spray Place 1-2 sprays into the nose daily.   glipiZIDE (GLUCOTROL XL) 10 MG 24 hr tablet TAKE 1 TABLET(10 MG) BY MOUTH DAILY WITH BREAKFAST   Glucosamine-Chondroitin (MOVE FREE PO) Take 1 tablet by mouth daily.   ibuprofen (ADVIL,MOTRIN) 200 MG tablet Take 400 mg by mouth at bedtime. As needed   lovastatin (MEVACOR) 40 MG tablet TAKE 1 TABLET(40 MG) BY MOUTH AT BEDTIME   metFORMIN (GLUCOPHAGE) 1000 MG tablet TAKE 1 TABLET(1000 MG) BY MOUTH TWICE DAILY WITH A MEAL   Multiple Vitamins-Minerals (CENTRUM SILVER PO) Take 1 tablet by mouth daily.   OLANZapine (ZYPREXA) 2.5 MG tablet Take 2.5 mg by mouth daily as needed.   pioglitazone (ACTOS) 30 MG tablet TAKE 1 TABLET(30 MG) BY MOUTH DAILY   propranolol (INDERAL) 10 MG tablet TAKE 1 TABLET(10 MG) BY MOUTH TWICE DAILY   zinc gluconate 50 MG tablet Take by mouth daily. Dose unknown   No facility-administered encounter medications on file as of 12/11/2020.    Patient Active Problem List   Diagnosis Date Noted   Polyp of colon    GAD (generalized anxiety disorder) 11/19/2020   Family history of colon cancer 11/19/2020   Attention and concentration deficit 11/09/2020   Chronic bilateral low back pain without sciatica 11/09/2020   IBD (inflammatory bowel disease) 11/09/2020   Panic attacks 11/09/2020   Adjustment insomnia 11/09/2020  Pain due to onychomycosis of toenails of both feet 08/08/2019   Panic disorder with agoraphobia and mild panic attacks 06/02/2019   Cervical high risk HPV (human papillomavirus) test positive 04/18/2019   Depression, recurrent ([place]) 05/26/2016   Benign essential HTN 02/14/2015   Dysuria 12/24/2014   Dyspareunia in female 09/28/2014   Atrophic vaginitis 09/28/2014   Urge incontinence 09/28/2014    Renal cyst, left 07/24/2014   Microscopic hematuria 07/24/2014   Headache, tension-type 07/24/2014   Hyperlipidemia associated with type 2 diabetes mellitus ([place]) 07/24/2014   Diabetes mellitus, type 2 ([place]) 07/24/2014    Conditions to be addressed/monitored:HTN and DMII  Patient Care Plan: Coronary Artery Disease, HTN, HLD     Problem Identified: CAD, HTN, HLD      Long-Range Goal: Disease Progression Prevented or Minimized   Start Date: 11/13/2020  Expected End Date: 02/11/2021  Priority: High  Note:   Objective:  Last practice recorded BP readings:  BP Readings from Last 3 Encounters:  11/09/20 (!) 155/70  05/14/20 (!) 147/81  08/08/19 (!) 160/78   Most recent eGFR/CrCl:  Lab Results  Component Value Date   EGFR 76 05/15/2020    No components found for: CRCL  Lab Results  Component Value Date   CHOL 181 05/15/2020   HDL 75 05/15/2020   LDLCALC 88 05/15/2020   TRIG 100 05/15/2020   CHOLHDL 2.3 04/18/2019    Current Barriers:  Chronic Disease Management support and educational needs related to HTN, HLD and CAD.  Case Manager Clinical Goal(s):  Over the next 90 days, patient will demonstrate improved adherence to prescribed treatment plan as evidenced by taking all medications as prescribed, monitoring and recording blood pressure and adhering to a low sodium/DASH diet.   Interventions:  Collaboration with Melissa Sprout, FNP regarding development and update of comprehensive plan of care as evidenced by provider attestation and co-signature Inter-disciplinary care team collaboration (see longitudinal plan of care) Reviewed medications and discussed compliance with treatment plan. Reports taking all medications as prescribed. Reviewed established BP readings and indications for notifying a provider. Reports BP readings have been within range. Reports attempting to increase activity/walking but continues to experience fatigue and pain.  Reviewed nutritional intake.  Advised to continue monitoring sodium intake and avoid highly processed foods when possible.  Reviewed s/sx of heart attack, stroke and worsening symptoms that require immediate medical attention.  Patient Goals/Self-Care Activities: Self-administer medications as prescribed Monitor and record blood pressure Adhere to recommended cardiac prudent/heart healthy diet Notify provider or care management team with questions and new concerns as needed    Patient Care Plan: Diabetes Type 2 (Adult)     Problem Identified: Glycemic Management (Diabetes, Type 2)      Goal: Glycemic Management Optimized   Start Date: 11/13/2020  Expected End Date: 02/11/2021  Priority: High  Note:   Objective:  Lab Results  Component Value Date   HGBA1C 7.1 (H) 05/15/2020   Lab Results  Component Value Date   CREATININE 0.84 05/15/2020   CREATININE 0.82 04/18/2019   CREATININE 0.80 04/04/2019   Lab Results  Component Value Date   EGFR 76 05/15/2020   Current Barriers:  Chronic Disease Management support and educational needs related to Diabetes.  Case Manager Clinical Goal(s):  Over the next 90 days, patient will demonstrate improved adherence to prescribed treatment plan for diabetes self care/management as evidenced by taking all medications as prescribed,  daily monitoring and recording of CBG's and adherence to a ADA/ carb modified  diet.  Interventions:  Collaboration with Melissa Sprout, FNP regarding development and update of comprehensive plan of care as evidenced by provider attestation and co-signature Inter-disciplinary care team collaboration (see longitudinal plan of care) Reviewed compliance with treatment plan. Reports not monitoring blood glucose consistently over the past week. Denies s/sx of hypoglycemia and hyperglycemia. She continues to experience fatigue almost daily. Advised to monitor blood glucose levels and record readings.  Reviewed nutritional intake and importance of  complying with an ADA/carb modified diet. Advised to consume full meals when able to tolerate and continue monitoring intake of carbohydrates. Advised to avoid concentrated sugars when possible. Strongly advise to ensure a quick acting sugar is readily available to consume in the event she experiences sx of hypoglycemia.   Patient Goals/Self-Care Activities Self-administer medications as prescribed Attend all scheduled provider appointments Monitor blood glucose levels consistently and utilize recommended interventions Adhere to prescribed ADA/carb modified Notify provider or care management team with questions and new concerns as needed     Patient Care Plan: Fall Risk (Adult)     Problem Identified: Fall Risk      Long-Range Goal: Absence of Fall and Fall-Related Injury   Start Date: 11/13/2020  Expected End Date: 02/11/2021  Priority: High  Note:   Current Barriers:  Fall Risk related to Fatigue, Chronic Back Pain and Impaired Balance  Clinical Goal(s):  Over the next 90 days, patient will not experience falls or require hospitalization due to fall related injuries.  Interventions:  Collaboration with Melissa Sprout, FNP regarding development and update of comprehensive plan of care as evidenced by provider attestation and co-signature Inter-disciplinary care team collaboration (see longitudinal plan of care) Reviewed medications and discussed potential side effects such as dizziness and lightheadedness. Provided information regarding safety and fall prevention. Advised to use an assistive device if needed. Advised to avoid prolonged activity to prevent accident falls d/t exertion. Encouraged to avoid attempting to lift weighted objects and request assistance when needed.  Discussed ability to perform ADL's and tasks in the home. Reports currently completing ADL's and IADL's independently. Declines current need for additional assistance in the home. Will keep the care management team  updated if functional status changes.   Self-Care Deficits/Patient Goals:  Utilize assistive device appropriately with all ambulation Ensure pathways are clear and well lit Change positions slowly and use caution when ambulating Wear secure fitting, skid free footwear when ambulating Update the care management team with changes in functional status Notify provider or care management team for questions and new concerns as needed   Follow Up Plan:  Will follow up next month      PLAN A member of the care management team will follow up next month.  Cristy Friedlander Health/THN Care Management [facility] [phone]

## 2020-12-13 DIAGNOSIS — F4011 Social phobia, generalized: Secondary | ICD-10-CM | POA: Diagnosis not present

## 2020-12-13 DIAGNOSIS — F3176 Bipolar disorder, in full remission, most recent episode depressed: Secondary | ICD-10-CM | POA: Diagnosis not present

## 2020-12-13 DIAGNOSIS — F900 Attention-deficit hyperactivity disorder, predominantly inattentive type: Secondary | ICD-10-CM | POA: Diagnosis not present

## 2020-12-13 DIAGNOSIS — F410 Panic disorder [episodic paroxysmal anxiety] without agoraphobia: Secondary | ICD-10-CM | POA: Diagnosis not present

## 2020-12-13 DIAGNOSIS — F3341 Major depressive disorder, recurrent, in partial remission: Secondary | ICD-10-CM | POA: Diagnosis not present

## 2020-12-19 ENCOUNTER — Ambulatory Visit: Payer: Self-pay

## 2020-12-19 DIAGNOSIS — R4189 Other symptoms and signs involving cognitive functions and awareness: Secondary | ICD-10-CM

## 2020-12-19 DIAGNOSIS — E1169 Type 2 diabetes mellitus with other specified complication: Secondary | ICD-10-CM

## 2020-12-19 NOTE — Chronic Care Management (AMB) (Signed)
Chronic Care Management   CCM RN Visit Note  12/19/2020 Name: Melissa Arroyo MRN: [id number] DOB: 02-14-1950  Subjective: Melissa Arroyo is a 70 y.o. year old female who is a primary care patient of [name], FNP. The care management team was consulted for assistance with disease management and care coordination needs.    Engaged with patient by telephone for follow up visit in response to provider referral for case management and care coordination services.   Consent to Services:  The patient was given information about Chronic Care Management services, agreed to services, and gave verbal consent prior to initiation of services.  Please see initial visit note for detailed documentation.   Assessment: Review of patient past medical history, allergies, medications, health status, including review of consultants reports, laboratory and other test data, was performed as part of comprehensive evaluation and provision of chronic care management services.   SDOH (Social Determinants of Health) assessments and interventions performed: No  CCM Care Plan  Allergies  Allergen Reactions   Vesicare [Solifenacin] Anaphylaxis    Throat Closed   Penicillins Rash    Outpatient Encounter Medications as of 12/19/2020  Medication Sig   Alcohol Swabs (ALCOHOL PREP) PADS Use 1 pad prior to checking blood sugar to clean skin   b complex vitamins capsule Take 1 capsule by mouth daily.   blood glucose meter kit and supplies Dispense based on patient and insurance preference. Check glucose once daily. (FOR ICD-10 E11.29).   buPROPion (WELLBUTRIN XL) 300 MG 24 hr tablet Take 300 mg by mouth every morning.   chlorhexidine (PERIDEX) 0.12 % solution SMARTSIG:By Mouth   Cholecalciferol (VITAMIN D3) 10 MCG (400 UNIT) tablet Take by mouth daily. Dose unknown   dicyclomine (BENTYL) 10 MG capsule Take 1 capsule (10 mg total) by mouth 4 (four) times daily -  before meals and at bedtime.   estradiol (ESTRACE)  0.1 MG/GM vaginal cream Place 1 Applicatorful vaginally at bedtime.   FLUoxetine (PROZAC) 20 MG capsule Take 3 capsules (60 mg total) by mouth daily.   fluticasone (FLONASE SENSIMIST) 27.5 MCG/SPRAY nasal spray Place 1-2 sprays into the nose daily.   glipiZIDE (GLUCOTROL XL) 10 MG 24 hr tablet TAKE 1 TABLET(10 MG) BY MOUTH DAILY WITH BREAKFAST   Glucosamine-Chondroitin (MOVE FREE PO) Take 1 tablet by mouth daily.   ibuprofen (ADVIL,MOTRIN) 200 MG tablet Take 400 mg by mouth at bedtime. As needed   lovastatin (MEVACOR) 40 MG tablet TAKE 1 TABLET(40 MG) BY MOUTH AT BEDTIME   metFORMIN (GLUCOPHAGE) 1000 MG tablet TAKE 1 TABLET(1000 MG) BY MOUTH TWICE DAILY WITH A MEAL   Multiple Vitamins-Minerals (CENTRUM SILVER PO) Take 1 tablet by mouth daily.   OLANZapine (ZYPREXA) 2.5 MG tablet Take 2.5 mg by mouth daily as needed.   pioglitazone (ACTOS) 30 MG tablet TAKE 1 TABLET(30 MG) BY MOUTH DAILY   propranolol (INDERAL) 10 MG tablet TAKE 1 TABLET(10 MG) BY MOUTH TWICE DAILY   zinc gluconate 50 MG tablet Take by mouth daily. Dose unknown   No facility-administered encounter medications on file as of 12/19/2020.    Patient Active Problem List   Diagnosis Date Noted   Polyp of colon    GAD (generalized anxiety disorder) 11/19/2020   Family history of colon cancer 11/19/2020   Attention and concentration deficit 11/09/2020   Chronic bilateral low back pain without sciatica 11/09/2020   IBD (inflammatory bowel disease) 11/09/2020   Panic attacks 11/09/2020   Adjustment insomnia 11/09/2020   Pain  due to onychomycosis of toenails of both feet 08/08/2019   Panic disorder with agoraphobia and mild panic attacks 06/02/2019   Cervical high risk HPV (human papillomavirus) test positive 04/18/2019   Depression, recurrent ([place]) 05/26/2016   Benign essential HTN 02/14/2015   Dysuria 12/24/2014   Dyspareunia in female 09/28/2014   Atrophic vaginitis 09/28/2014   Urge incontinence 09/28/2014   Renal cyst,  left 07/24/2014   Microscopic hematuria 07/24/2014   Headache, tension-type 07/24/2014   Hyperlipidemia associated with type 2 diabetes mellitus ([place]) 07/24/2014   Diabetes mellitus, type 2 ([place]) 07/24/2014    Patient Care Plan: Coronary Artery Disease, HTN, HLD     Problem Identified: CAD, HTN, HLD      Long-Range Goal: Disease Progression Prevented or Minimized   Start Date: 11/13/2020  Expected End Date: 02/11/2021  Priority: High  Note:   Objective:  Last practice recorded BP readings:  BP Readings from Last 3 Encounters:  11/09/20 (!) 155/70  05/14/20 (!) 147/81  08/08/19 (!) 160/78   Most recent eGFR/CrCl:  Lab Results  Component Value Date   EGFR 76 05/15/2020    No components found for: CRCL  Lab Results  Component Value Date   CHOL 181 05/15/2020   HDL 75 05/15/2020   LDLCALC 88 05/15/2020   TRIG 100 05/15/2020   CHOLHDL 2.3 04/18/2019    Current Barriers:  Chronic Disease Management support and educational needs related to HTN, HLD and CAD.  Case Manager Clinical Goal(s):  Over the next 90 days, patient will demonstrate improved adherence to prescribed treatment plan as evidenced by taking all medications as prescribed, monitoring and recording blood pressure and adhering to a low sodium/DASH diet.   Interventions:  Collaboration with [name], FNP regarding development and update of comprehensive plan of care as evidenced by provider attestation and co-signature Inter-disciplinary care team collaboration (see longitudinal plan of care) Reviewed medications and discussed compliance with treatment plan. Reports taking all medications as prescribed. Reviewed established BP readings and indications for notifying a provider. Reports BP readings have been within range. Reports attempting to increase activity/walking but continues to experience fatigue and pain.  Reviewed nutritional intake. Advised to continue monitoring sodium intake and avoid highly  processed foods when possible.  Reviewed s/sx of heart attack, stroke and worsening symptoms that require immediate medical attention. Update on 12/19/20: Patient called to request extension of short term disability. Requesting letter to return to work on 02/23/21. Confirmed completing outreach with Dr. Clent Ridges team. Indicated their team will initiate approval for long term disability. She was advised to follow up with her PCP team to extend time from work. Will forward notification to PCP and follow up within the next week.  Patient Goals/Self-Care Activities: Self-administer medications as prescribed Monitor and record blood pressure Adhere to recommended cardiac prudent/heart healthy diet Notify provider or care management team with questions and new concerns as needed     PLAN A member of the care management team will follow up within the next week.   Cristy [facility]lander [facility] Care Management [facility] [phone]

## 2020-12-20 ENCOUNTER — Telehealth: Payer: Self-pay

## 2020-12-20 NOTE — Telephone Encounter (Signed)
Please review, as message stated she will be seeing psychiatry in December who will work on long term case for patient but we initiated short term so she will need letter extended. KW

## 2020-12-20 NOTE — Telephone Encounter (Signed)
Copied from [place] [id number]. Topic: General - Other >> Dec 20, 2020  9:54 AM Celene Kras wrote: Reason for CRM: Pt called and is requesting to have a letter witting her out of work until 02/23/21 due to her short term disability. Please advise.

## 2020-12-21 ENCOUNTER — Ambulatory Visit: Payer: Self-pay

## 2020-12-21 DIAGNOSIS — E1169 Type 2 diabetes mellitus with other specified complication: Secondary | ICD-10-CM

## 2020-12-21 DIAGNOSIS — R4189 Other symptoms and signs involving cognitive functions and awareness: Secondary | ICD-10-CM

## 2020-12-21 NOTE — Chronic Care Management (AMB) (Signed)
Chronic Care Management   CCM RN Visit Note  12/21/2020 Name: Melissa Arroyo MRN: [id number] DOB: 1950-07-01  Subjective: Melissa Arroyo is a 70 y.o. year old female who is a primary care patient of [name], FNP. The care management team was consulted for assistance with disease management and care coordination needs.    Engaged with patient by telephone for follow up visit in response to provider referral for case management and care coordination services.   Consent to Services:  The patient was given information about Chronic Care Management services, agreed to services, and gave verbal consent prior to initiation of services.  Please see initial visit note for detailed documentation.   Assessment: Review of patient past medical history, allergies, medications, health status, including review of consultants reports, laboratory and other test data, was performed as part of comprehensive evaluation and provision of chronic care management services.   SDOH (Social Determinants of Health) assessments and interventions performed: No  CCM Care Plan  Allergies  Allergen Reactions   Vesicare [Solifenacin] Anaphylaxis    Throat Closed   Penicillins Rash    Outpatient Encounter Medications as of 12/21/2020  Medication Sig   Alcohol Swabs (ALCOHOL PREP) PADS Use 1 pad prior to checking blood sugar to clean skin   b complex vitamins capsule Take 1 capsule by mouth daily.   blood glucose meter kit and supplies Dispense based on patient and insurance preference. Check glucose once daily. (FOR ICD-10 E11.29).   buPROPion (WELLBUTRIN XL) 300 MG 24 hr tablet Take 300 mg by mouth every morning.   chlorhexidine (PERIDEX) 0.12 % solution SMARTSIG:By Mouth   Cholecalciferol (VITAMIN D3) 10 MCG (400 UNIT) tablet Take by mouth daily. Dose unknown   dicyclomine (BENTYL) 10 MG capsule Take 1 capsule (10 mg total) by mouth 4 (four) times daily -  before meals and at bedtime.   estradiol  (ESTRACE) 0.1 MG/GM vaginal cream Place 1 Applicatorful vaginally at bedtime.   FLUoxetine (PROZAC) 20 MG capsule Take 3 capsules (60 mg total) by mouth daily.   fluticasone (FLONASE SENSIMIST) 27.5 MCG/SPRAY nasal spray Place 1-2 sprays into the nose daily.   glipiZIDE (GLUCOTROL XL) 10 MG 24 hr tablet TAKE 1 TABLET(10 MG) BY MOUTH DAILY WITH BREAKFAST   Glucosamine-Chondroitin (MOVE FREE PO) Take 1 tablet by mouth daily.   ibuprofen (ADVIL,MOTRIN) 200 MG tablet Take 400 mg by mouth at bedtime. As needed   lovastatin (MEVACOR) 40 MG tablet TAKE 1 TABLET(40 MG) BY MOUTH AT BEDTIME   metFORMIN (GLUCOPHAGE) 1000 MG tablet TAKE 1 TABLET(1000 MG) BY MOUTH TWICE DAILY WITH A MEAL   Multiple Vitamins-Minerals (CENTRUM SILVER PO) Take 1 tablet by mouth daily.   OLANZapine (ZYPREXA) 2.5 MG tablet Take 2.5 mg by mouth daily as needed.   pioglitazone (ACTOS) 30 MG tablet TAKE 1 TABLET(30 MG) BY MOUTH DAILY   propranolol (INDERAL) 10 MG tablet TAKE 1 TABLET(10 MG) BY MOUTH TWICE DAILY   zinc gluconate 50 MG tablet Take by mouth daily. Dose unknown   No facility-administered encounter medications on file as of 12/21/2020.    Patient Active Problem List   Diagnosis Date Noted   Polyp of colon    GAD (generalized anxiety disorder) 11/19/2020   Family history of colon cancer 11/19/2020   Attention and concentration deficit 11/09/2020   Chronic bilateral low back pain without sciatica 11/09/2020   IBD (inflammatory bowel disease) 11/09/2020   Panic attacks 11/09/2020   Adjustment insomnia 11/09/2020   Pain  due to onychomycosis of toenails of both feet 08/08/2019   Panic disorder with agoraphobia and mild panic attacks 06/02/2019   Cervical high risk HPV (human papillomavirus) test positive 04/18/2019   Depression, recurrent ([place]) 05/26/2016   Benign essential HTN 02/14/2015   Dysuria 12/24/2014   Dyspareunia in female 09/28/2014   Atrophic vaginitis 09/28/2014   Urge incontinence 09/28/2014    Renal cyst, left 07/24/2014   Microscopic hematuria 07/24/2014   Headache, tension-type 07/24/2014   Hyperlipidemia associated with type 2 diabetes mellitus ([place]) 07/24/2014   Diabetes mellitus, type 2 ([place]) 07/24/2014    Patient Care Plan: Wellness (Adult)     Problem Identified: Cognitive Function (Wellness)      Goal: Cognitive Function Enhanced   Start Date: 11/13/2020  Expected End Date: 12/12/2020  Priority: High     Patient Care Plan: Coronary Artery Disease, HTN, HLD     Problem Identified: CAD, HTN, HLD      Long-Range Goal: Disease Progression Prevented or Minimized   Start Date: 11/13/2020  Expected End Date: 02/11/2021  Priority: High  Note:   Objective:  Last practice recorded BP readings:  BP Readings from Last 3 Encounters:  11/09/20 (!) 155/70  05/14/20 (!) 147/81  08/08/19 (!) 160/78   Most recent eGFR/CrCl:  Lab Results  Component Value Date   EGFR 76 05/15/2020    No components found for: CRCL  Lab Results  Component Value Date   CHOL 181 05/15/2020   HDL 75 05/15/2020   LDLCALC 88 05/15/2020   TRIG 100 05/15/2020   CHOLHDL 2.3 04/18/2019    Current Barriers:  Chronic Disease Management support and educational needs related to HTN, HLD and CAD.  Case Manager Clinical Goal(s):  Over the next 90 days, patient will demonstrate improved adherence to prescribed treatment plan as evidenced by taking all medications as prescribed, monitoring and recording blood pressure and adhering to a low sodium/DASH diet.   Interventions:  Collaboration with [name], FNP regarding development and update of comprehensive plan of care as evidenced by provider attestation and co-signature Inter-disciplinary care team collaboration (see longitudinal plan of care) Reviewed medications and discussed compliance with treatment plan. Reports taking all medications as prescribed. Reviewed established BP readings and indications for notifying a provider. Reports BP  readings have been within range. Reports attempting to increase activity/walking but continues to experience fatigue and pain.  Reviewed nutritional intake. Advised to continue monitoring sodium intake and avoid highly processed foods when possible.  Reviewed s/sx of heart attack, stroke and worsening symptoms that require immediate medical attention. Update on 12/21/20: Collaborated with PCP and clinic team regarding need for extension of short term disability. Patient will not require a clinic visit. Discussed information/letter needed by her employer. Mrs. Safley is aware that the letter will be available for pick up today. Advised to follow up with Dr. Clent Ridges team as advised to ensure request for long term disability is initiated.   Patient Goals/Self-Care Activities: Self-administer medications as prescribed Monitor and record blood pressure Adhere to recommended cardiac prudent/heart healthy diet Notify provider or care management team with questions and new concerns as needed    Patient Care Plan: Diabetes Type 2 (Adult)     Problem Identified: Glycemic Management (Diabetes, Type 2)      Goal: Glycemic Management Optimized   Start Date: 11/13/2020  Expected End Date: 02/11/2021  Priority: High  Note:   Objective:  Lab Results  Component Value Date   HGBA1C 7.1 (H) 05/15/2020  Lab Results  Component Value Date   CREATININE 0.84 05/15/2020   CREATININE 0.82 04/18/2019   CREATININE 0.80 04/04/2019   Lab Results  Component Value Date   EGFR 76 05/15/2020   Current Barriers:  Chronic Disease Management support and educational needs related to Diabetes.  Case Manager Clinical Goal(s):  Over the next 90 days, patient will demonstrate improved adherence to prescribed treatment plan for diabetes self care/management as evidenced by taking all medications as prescribed,  daily monitoring and recording of CBG's and adherence to a ADA/ carb modified diet.  Interventions:   Collaboration with [name], FNP regarding development and update of comprehensive plan of care as evidenced by provider attestation and co-signature Inter-disciplinary care team collaboration (see longitudinal plan of care) Reviewed compliance with treatment plan. Reports not monitoring blood glucose consistently over the past week. Denies s/sx of hypoglycemia and hyperglycemia. She continues to experience fatigue almost daily. Advised to monitor blood glucose levels and record readings.  Reviewed nutritional intake and importance of complying with an ADA/carb modified diet. Advised to consume full meals when able to tolerate and continue monitoring intake of carbohydrates. Advised to avoid concentrated sugars when possible. Strongly advise to ensure a quick acting sugar is readily available to consume in the event she experiences sx of hypoglycemia.   Patient Goals/Self-Care Activities Self-administer medications as prescribed Attend all scheduled provider appointments Monitor blood glucose levels consistently and utilize recommended interventions Adhere to prescribed ADA/carb modified Notify provider or care management team with questions and new concerns as needed     Patient Care Plan: Fall Risk (Adult)     Problem Identified: Fall Risk      Long-Range Goal: Absence of Fall and Fall-Related Injury   Start Date: 11/13/2020  Expected End Date: 02/11/2021  Priority: High  Note:   Current Barriers:  Fall Risk related to Fatigue, Chronic Back Pain and Impaired Balance  Clinical Goal(s):  Over the next 90 days, patient will not experience falls or require hospitalization due to fall related injuries.  Interventions:  Collaboration with [name], FNP regarding development and update of comprehensive plan of care as evidenced by provider attestation and co-signature Inter-disciplinary care team collaboration (see longitudinal plan of care) Reviewed medications and discussed  potential side effects such as dizziness and lightheadedness. Provided information regarding safety and fall prevention. Advised to use an assistive device if needed. Advised to avoid prolonged activity to prevent accident falls d/t exertion. Encouraged to avoid attempting to lift weighted objects and request assistance when needed.  Discussed ability to perform ADL's and tasks in the home. Reports currently completing ADL's and IADL's independently. Declines current need for additional assistance in the home. Will keep the care management team updated if functional status changes.   Self-Care Deficits/Patient Goals:  Utilize assistive device appropriately with all ambulation Ensure pathways are clear and well lit Change positions slowly and use caution when ambulating Wear secure fitting, skid free footwear when ambulating Update the care management team with changes in functional status Notify provider or care management team for questions and new concerns as needed   Follow Up Plan:  Will follow up next month       PLAN A member of the care management team will follow up next month.   Cristy [facility]lander [facility] Care Management [facility] [phone]

## 2020-12-21 NOTE — Patient Instructions (Signed)
Thank you for allowing the Chronic Care Management team to participate in your care.  

## 2020-12-25 ENCOUNTER — Ambulatory Visit: Payer: Self-pay

## 2020-12-25 DIAGNOSIS — E785 Hyperlipidemia, unspecified: Secondary | ICD-10-CM

## 2020-12-25 DIAGNOSIS — R4189 Other symptoms and signs involving cognitive functions and awareness: Secondary | ICD-10-CM

## 2020-12-25 DIAGNOSIS — E1169 Type 2 diabetes mellitus with other specified complication: Secondary | ICD-10-CM

## 2020-12-26 ENCOUNTER — Ambulatory Visit: Payer: Self-pay

## 2020-12-26 DIAGNOSIS — E1169 Type 2 diabetes mellitus with other specified complication: Secondary | ICD-10-CM

## 2020-12-26 DIAGNOSIS — E785 Hyperlipidemia, unspecified: Secondary | ICD-10-CM

## 2020-12-26 DIAGNOSIS — I10 Essential (primary) hypertension: Secondary | ICD-10-CM

## 2020-12-26 DIAGNOSIS — R4189 Other symptoms and signs involving cognitive functions and awareness: Secondary | ICD-10-CM

## 2020-12-26 NOTE — Chronic Care Management (AMB) (Signed)
Chronic Care Management   CCM RN Visit Note  12/26/2020 Name: Melissa Arroyo MRN: [id number] DOB: 12-Nov-1950  Subjective: Melissa Arroyo is a 70 y.o. year old female who is a primary care patient of [name], FNP. The care management team was consulted for assistance with disease management and care coordination needs.    Engaged with patient by telephone for follow up visit in response to provider referral for case management and care coordination services.   Consent to Services:  The patient was given information about Chronic Care Management services, agreed to services, and gave verbal consent prior to initiation of services.  Please see initial visit note for detailed documentation.    Assessment: Review of patient past medical history, allergies, medications, health status, including review of consultants reports, laboratory and other test data, was performed as part of comprehensive evaluation and provision of chronic care management services.   SDOH (Social Determinants of Health) assessments and interventions performed: No  CCM Care Plan  Allergies  Allergen Reactions   Vesicare [Solifenacin] Anaphylaxis    Throat Closed   Penicillins Rash    Outpatient Encounter Medications as of 12/26/2020  Medication Sig   Alcohol Swabs (ALCOHOL PREP) PADS Use 1 pad prior to checking blood sugar to clean skin   b complex vitamins capsule Take 1 capsule by mouth daily.   blood glucose meter kit and supplies Dispense based on patient and insurance preference. Check glucose once daily. (FOR ICD-10 E11.29).   buPROPion (WELLBUTRIN XL) 300 MG 24 hr tablet Take 300 mg by mouth every morning.   chlorhexidine (PERIDEX) 0.12 % solution SMARTSIG:By Mouth   Cholecalciferol (VITAMIN D3) 10 MCG (400 UNIT) tablet Take by mouth daily. Dose unknown   dicyclomine (BENTYL) 10 MG capsule Take 1 capsule (10 mg total) by mouth 4 (four) times daily -  before meals and at bedtime.   estradiol  (ESTRACE) 0.1 MG/GM vaginal cream Place 1 Applicatorful vaginally at bedtime.   FLUoxetine (PROZAC) 20 MG capsule Take 3 capsules (60 mg total) by mouth daily.   fluticasone (FLONASE SENSIMIST) 27.5 MCG/SPRAY nasal spray Place 1-2 sprays into the nose daily.   glipiZIDE (GLUCOTROL XL) 10 MG 24 hr tablet TAKE 1 TABLET(10 MG) BY MOUTH DAILY WITH BREAKFAST   Glucosamine-Chondroitin (MOVE FREE PO) Take 1 tablet by mouth daily.   ibuprofen (ADVIL,MOTRIN) 200 MG tablet Take 400 mg by mouth at bedtime. As needed   lovastatin (MEVACOR) 40 MG tablet TAKE 1 TABLET(40 MG) BY MOUTH AT BEDTIME   metFORMIN (GLUCOPHAGE) 1000 MG tablet TAKE 1 TABLET(1000 MG) BY MOUTH TWICE DAILY WITH A MEAL   Multiple Vitamins-Minerals (CENTRUM SILVER PO) Take 1 tablet by mouth daily.   OLANZapine (ZYPREXA) 2.5 MG tablet Take 2.5 mg by mouth daily as needed.   pioglitazone (ACTOS) 30 MG tablet TAKE 1 TABLET(30 MG) BY MOUTH DAILY   propranolol (INDERAL) 10 MG tablet TAKE 1 TABLET(10 MG) BY MOUTH TWICE DAILY   zinc gluconate 50 MG tablet Take by mouth daily. Dose unknown   No facility-administered encounter medications on file as of 12/26/2020.    Patient Active Problem List   Diagnosis Date Noted   Polyp of colon    GAD (generalized anxiety disorder) 11/19/2020   Family history of colon cancer 11/19/2020   Attention and concentration deficit 11/09/2020   Chronic bilateral low back pain without sciatica 11/09/2020   IBD (inflammatory bowel disease) 11/09/2020   Panic attacks 11/09/2020   Adjustment insomnia 11/09/2020  Pain due to onychomycosis of toenails of both feet 08/08/2019   Panic disorder with agoraphobia and mild panic attacks 06/02/2019   Cervical high risk HPV (human papillomavirus) test positive 04/18/2019   Depression, recurrent ([place]) 05/26/2016   Benign essential HTN 02/14/2015   Dysuria 12/24/2014   Dyspareunia in female 09/28/2014   Atrophic vaginitis 09/28/2014   Urge incontinence 09/28/2014    Renal cyst, left 07/24/2014   Microscopic hematuria 07/24/2014   Headache, tension-type 07/24/2014   Hyperlipidemia associated with type 2 diabetes mellitus ([place]) 07/24/2014   Diabetes mellitus, type 2 ([place]) 07/24/2014     Patient Care Plan: Coronary Artery Disease, HTN, HLD     Problem Identified: CAD, HTN, HLD      Long-Range Goal: Disease Progression Prevented or Minimized   Start Date: 11/13/2020  Expected End Date: 02/11/2021  Priority: High  Note:   Objective:  Last practice recorded BP readings:  BP Readings from Last 3 Encounters:  11/09/20 (!) 155/70  05/14/20 (!) 147/81  08/08/19 (!) 160/78   Most recent eGFR/CrCl:  Lab Results  Component Value Date   EGFR 76 05/15/2020    No components found for: CRCL  Lab Results  Component Value Date   CHOL 181 05/15/2020   HDL 75 05/15/2020   LDLCALC 88 05/15/2020   TRIG 100 05/15/2020   CHOLHDL 2.3 04/18/2019    Current Barriers:  Chronic Disease Management support and educational needs related to HTN, HLD and CAD.  Case Manager Clinical Goal(s):  Over the next 90 days, patient will demonstrate improved adherence to prescribed treatment plan as evidenced by taking all medications as prescribed, monitoring and recording blood pressure and adhering to a low sodium/DASH diet.   Interventions:  Collaboration with [name], FNP regarding development and update of comprehensive plan of care as evidenced by provider attestation and co-signature Inter-disciplinary care team collaboration (see longitudinal plan of care) Reviewed medications and discussed compliance with treatment plan. Reports taking all medications as prescribed. Reviewed established BP readings and indications for notifying a provider. Reports BP readings have been within range. Reports attempting to increase activity/walking but continues to experience fatigue and pain.  Reviewed nutritional intake. Advised to continue monitoring sodium intake and avoid  highly processed foods when possible.  Reviewed s/sx of heart attack, stroke and worsening symptoms that require immediate medical attention. Update on 12/26/20: Follow up regarding needed clinic notes. Confirmed that previous packet including provider notes were submitted to [facility] as required. Patient reports completing multiple specialty appointments since the initial packet was submitted. Requested assistance with determining if the requested notes are actually for specialty visits. She has not been evaluated at [facility] since October. Will review chart to review visits. Ms. Omura aware of need to reach out to respected clinics to forward notes to [facility] as requested. The Psychiatry team will continue to work on documentation for long term disability.  Patient Goals/Self-Care Activities: Self-administer medications as prescribed Monitor and record blood pressure Adhere to recommended cardiac prudent/heart healthy diet Notify provider or care management team with questions and new concerns as needed      PLAN A member of the care management team will follow up within the next week.   Cristy Friedlander [facility] Care Management [facility] [phone]

## 2020-12-27 ENCOUNTER — Other Ambulatory Visit: Payer: Self-pay

## 2020-12-27 ENCOUNTER — Encounter: Payer: Self-pay | Admitting: Psychiatry

## 2020-12-27 ENCOUNTER — Ambulatory Visit (INDEPENDENT_AMBULATORY_CARE_PROVIDER_SITE_OTHER): Payer: Medicare Other | Admitting: Psychiatry

## 2020-12-27 VITALS — BP 140/78 | HR 74 | Temp 97.9°F | Wt 205.2 lb

## 2020-12-27 DIAGNOSIS — F4010 Social phobia, unspecified: Secondary | ICD-10-CM

## 2020-12-27 DIAGNOSIS — F1021 Alcohol dependence, in remission: Secondary | ICD-10-CM

## 2020-12-27 DIAGNOSIS — F410 Panic disorder [episodic paroxysmal anxiety] without agoraphobia: Secondary | ICD-10-CM

## 2020-12-27 DIAGNOSIS — F32A Depression, unspecified: Secondary | ICD-10-CM | POA: Diagnosis not present
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
## 2020-12-28 ENCOUNTER — Ambulatory Visit: Payer: Self-pay

## 2020-12-28 ENCOUNTER — Telehealth: Payer: Self-pay | Admitting: Family Medicine

## 2020-12-28 DIAGNOSIS — I10 Essential (primary) hypertension: Secondary | ICD-10-CM

## 2020-12-28 DIAGNOSIS — R4189 Other symptoms and signs involving cognitive functions and awareness: Secondary | ICD-10-CM

## 2020-12-28 NOTE — Telephone Encounter (Signed)
As far as I know this documentation was sent and should have been scanned in. [place]

## 2020-12-28 NOTE — Telephone Encounter (Signed)
Pt is calling to request if medical records and office visit notes have been sent to [facility] from dates 11/04/20- Present Please advise [phone]- [phone]

## 2020-12-28 NOTE — Chronic Care Management (AMB) (Signed)
Chronic Care Management   CCM RN Visit Note  12/28/2020 Name: Melissa Arroyo MRN: [id number] DOB: 07/06/1950  Subjective: Melissa Arroyo is a 70 y.o. year old female who is a primary care patient of [name], FNP. The care management team was consulted for assistance with disease management and care coordination needs.    Engaged with patient by telephone for follow up visit in response to provider referral for case management and care coordination services.   Consent to Services:  The patient was given information about Chronic Care Management services, agreed to services, and gave verbal consent prior to initiation of services.  Please see initial visit note for detailed documentation.    Assessment: Review of patient past medical history, allergies, medications, health status, including review of consultants reports, laboratory and other test data, was performed as part of comprehensive evaluation and provision of chronic care management services.   SDOH (Social Determinants of Health) assessments and interventions performed: No  CCM Care Plan  Allergies  Allergen Reactions   Vesicare [Solifenacin] Anaphylaxis    Throat Closed   Penicillins Rash    Outpatient Encounter Medications as of 12/28/2020  Medication Sig   Alcohol Swabs (ALCOHOL PREP) PADS Use 1 pad prior to checking blood sugar to clean skin   b complex vitamins capsule Take 1 capsule by mouth daily.   blood glucose meter kit and supplies Dispense based on patient and insurance preference. Check glucose once daily. (FOR ICD-10 E11.29).   buPROPion (WELLBUTRIN XL) 300 MG 24 hr tablet Take 300 mg by mouth every morning.   chlorhexidine (PERIDEX) 0.12 % solution SMARTSIG:By Mouth   Cholecalciferol (VITAMIN D3) 10 MCG (400 UNIT) tablet Take by mouth daily. Dose unknown   dicyclomine (BENTYL) 10 MG capsule Take 1 capsule (10 mg total) by mouth 4 (four) times daily -  before meals and at bedtime.   estradiol  (ESTRACE) 0.1 MG/GM vaginal cream Place 1 Applicatorful vaginally at bedtime.   FLUoxetine (PROZAC) 20 MG capsule Take 3 capsules (60 mg total) by mouth daily.   fluticasone (FLONASE SENSIMIST) 27.5 MCG/SPRAY nasal spray Place 1-2 sprays into the nose daily.   glipiZIDE (GLUCOTROL XL) 10 MG 24 hr tablet TAKE 1 TABLET(10 MG) BY MOUTH DAILY WITH BREAKFAST   Glucosamine-Chondroitin (MOVE FREE PO) Take 1 tablet by mouth daily.   ibuprofen (ADVIL,MOTRIN) 200 MG tablet Take 400 mg by mouth at bedtime. As needed   lovastatin (MEVACOR) 40 MG tablet TAKE 1 TABLET(40 MG) BY MOUTH AT BEDTIME   metFORMIN (GLUCOPHAGE) 1000 MG tablet TAKE 1 TABLET(1000 MG) BY MOUTH TWICE DAILY WITH A MEAL   Multiple Vitamins-Minerals (CENTRUM SILVER PO) Take 1 tablet by mouth daily.   OLANZapine (ZYPREXA) 5 MG tablet Take 7.5 mg by mouth at bedtime.   pioglitazone (ACTOS) 30 MG tablet TAKE 1 TABLET(30 MG) BY MOUTH DAILY   propranolol (INDERAL) 10 MG tablet TAKE 1 TABLET(10 MG) BY MOUTH TWICE DAILY   zinc gluconate 50 MG tablet Take by mouth daily. Dose unknown   No facility-administered encounter medications on file as of 12/28/2020.    Patient Active Problem List   Diagnosis Date Noted   Social anxiety disorder 12/27/2020   Alcohol use disorder, moderate, in sustained remission ([place]) 12/27/2020   Polyp of colon    GAD (generalized anxiety disorder) 11/19/2020   Family history of colon cancer 11/19/2020   Attention and concentration deficit 11/09/2020   Chronic bilateral low back pain without sciatica 11/09/2020   IBD (  inflammatory bowel disease) 11/09/2020   Panic attacks 11/09/2020   Adjustment insomnia 11/09/2020   Pain due to onychomycosis of toenails of both feet 08/08/2019   Panic disorder with agoraphobia and mild panic attacks 06/02/2019   Cervical high risk HPV (human papillomavirus) test positive 04/18/2019   Mass of palm 12/22/2018   Contracture of palmar fascia 12/22/2018   Frequent PVCs 07/15/2017    SOBOE (shortness of breath on exertion) 07/08/2017   Abnormal ECG 04/10/2017   Acute pain of right shoulder 08/18/2016   Depression 05/26/2016   Benign essential HTN 02/14/2015   Dysuria 12/24/2014   Dyspareunia in female 09/28/2014   Atrophic vaginitis 09/28/2014   Urge incontinence 09/28/2014   Renal cyst, left 07/24/2014   Microscopic hematuria 07/24/2014   Headache, tension-type 07/24/2014   Hyperlipidemia associated with type 2 diabetes mellitus ([place]) 07/24/2014   Diabetes mellitus, type 2 ([place]) 07/24/2014     Patient Care Plan: Coronary Artery Disease, HTN, HLD     Problem Identified: CAD, HTN, HLD      Long-Range Goal: Disease Progression Prevented or Minimized   Start Date: 11/13/2020  Expected End Date: 02/11/2021  Priority: High  Note:    Objective:  Last practice recorded BP readings:  BP Readings from Last 3 Encounters:  11/09/20 (!) 155/70  05/14/20 (!) 147/81  08/08/19 (!) 160/78   Most recent eGFR/CrCl:  Lab Results  Component Value Date   EGFR 76 05/15/2020    No components found for: CRCL  Lab Results  Component Value Date   CHOL 181 05/15/2020   HDL 75 05/15/2020   LDLCALC 88 05/15/2020   TRIG 100 05/15/2020   CHOLHDL 2.3 04/18/2019    Current Barriers:  Chronic Disease Management support and educational needs related to HTN, HLD and CAD.  Case Manager Clinical Goal(s):  Over the next 90 days, patient will demonstrate improved adherence to prescribed treatment plan as evidenced by taking all medications as prescribed, monitoring and recording blood pressure and adhering to a low sodium/DASH diet.   Interventions:  Collaboration with [name], FNP regarding development and update of comprehensive plan of care as evidenced by provider attestation and co-signature Inter-disciplinary care team collaboration (see longitudinal plan of care) Reviewed medications and discussed compliance with treatment plan. Reports taking all medications as  prescribed. Reviewed established BP readings and indications for notifying a provider. Reports BP readings have been within range. Reports attempting to increase activity/walking but continues to experience fatigue and pain.  Reviewed nutritional intake. Advised to continue monitoring sodium intake and avoid highly processed foods when possible.  Reviewed s/sx of heart attack, stroke and worsening symptoms that require immediate medical attention. Update on 12/28/20: Patient reports [employer] is requested updated documents related to the extension of her short term disability. Patient wants to confirm that she will not require a clinic visit prior to documents being submitted. Reviewed PCP recommendations. Patient's leave/extension request has been submitted to her employer. She will not require a clinic visit for documentation completion. The [place] staff will fax new documents to [employer]. Provider will complete and adjust dates to reflect her extended leave. Ms. Appelhans will call or contact the clinic if additional assistance is submitted.   Patient Goals/Self-Care Activities: Self-administer medications as prescribed Monitor and record blood pressure Adhere to recommended cardiac prudent/heart healthy diet Notify provider or care management team with questions and new concerns as needed       PLAN: A member of the care management team will follow up  with Ms. Axe within the next month.   Cristy Friedlander Health/THN Care Management [facility] [phone]

## 2020-12-28 NOTE — Telephone Encounter (Signed)
Spoke with patient and told her the records were faxed to [facility] on 11/20/20 by T. Presnell.   She said her leave was going to be extended and more notes would need to be sent. I told her that [employer] Liter would need to fax another request and form for Dr. B to complete.

## 2020-12-28 NOTE — Patient Instructions (Signed)
Thank you for allowing the Chronic Care Management team to participate in your care.  

## 2020-12-31 NOTE — Chronic Care Management (AMB) (Signed)
Chronic Care Management   CCM RN Visit Note   Name: Melissa Arroyo MRN: [id number] DOB: 08-09-1950  Subjective: Melissa Arroyo is a 70 y.o. year old female who is a primary care patient of [name], FNP. The care management team was consulted for assistance with disease management and care coordination needs.    Engaged with patient by telephone for follow up visit in response to provider referral for case management and care coordination services.   Consent to Services:  The patient was given information about Chronic Care Management services, agreed to services, and gave verbal consent prior to initiation of services.  Please see initial visit note for detailed documentation.    Assessment: Review of patient past medical history, allergies, medications, health status, including review of consultants reports, laboratory and other test data, was performed as part of comprehensive evaluation and provision of chronic care management services.   SDOH (Social Determinants of Health) assessments and interventions performed: No  CCM Care Plan  Allergies  Allergen Reactions   Vesicare [Solifenacin] Anaphylaxis    Throat Closed   Penicillins Rash    Outpatient Encounter Medications as of 12/25/2020  Medication Sig   Alcohol Swabs (ALCOHOL PREP) PADS Use 1 pad prior to checking blood sugar to clean skin   b complex vitamins capsule Take 1 capsule by mouth daily.   blood glucose meter kit and supplies Dispense based on patient and insurance preference. Check glucose once daily. (FOR ICD-10 E11.29).   buPROPion (WELLBUTRIN XL) 300 MG 24 hr tablet Take 300 mg by mouth every morning.   chlorhexidine (PERIDEX) 0.12 % solution SMARTSIG:By Mouth   Cholecalciferol (VITAMIN D3) 10 MCG (400 UNIT) tablet Take by mouth daily. Dose unknown   dicyclomine (BENTYL) 10 MG capsule Take 1 capsule (10 mg total) by mouth 4 (four) times daily -  before meals and at bedtime.   estradiol (ESTRACE) 0.1  MG/GM vaginal cream Place 1 Applicatorful vaginally at bedtime.   FLUoxetine (PROZAC) 20 MG capsule Take 3 capsules (60 mg total) by mouth daily.   fluticasone (FLONASE SENSIMIST) 27.5 MCG/SPRAY nasal spray Place 1-2 sprays into the nose daily.   glipiZIDE (GLUCOTROL XL) 10 MG 24 hr tablet TAKE 1 TABLET(10 MG) BY MOUTH DAILY WITH BREAKFAST   Glucosamine-Chondroitin (MOVE FREE PO) Take 1 tablet by mouth daily.   ibuprofen (ADVIL,MOTRIN) 200 MG tablet Take 400 mg by mouth at bedtime. As needed   lovastatin (MEVACOR) 40 MG tablet TAKE 1 TABLET(40 MG) BY MOUTH AT BEDTIME   metFORMIN (GLUCOPHAGE) 1000 MG tablet TAKE 1 TABLET(1000 MG) BY MOUTH TWICE DAILY WITH A MEAL   Multiple Vitamins-Minerals (CENTRUM SILVER PO) Take 1 tablet by mouth daily.   pioglitazone (ACTOS) 30 MG tablet TAKE 1 TABLET(30 MG) BY MOUTH DAILY   propranolol (INDERAL) 10 MG tablet TAKE 1 TABLET(10 MG) BY MOUTH TWICE DAILY   zinc gluconate 50 MG tablet Take by mouth daily. Dose unknown   [DISCONTINUED] OLANZapine (ZYPREXA) 2.5 MG tablet Take 2.5 mg by mouth daily as needed.   No facility-administered encounter medications on file as of 12/25/2020.    Patient Active Problem List   Diagnosis Date Noted   Social anxiety disorder 12/27/2020   Alcohol use disorder, moderate, in sustained remission ([place]) 12/27/2020   Polyp of colon    GAD (generalized anxiety disorder) 11/19/2020   Family history of colon cancer 11/19/2020   Attention and concentration deficit 11/09/2020   Chronic bilateral low back pain without sciatica 11/09/2020  IBD (inflammatory bowel disease) 11/09/2020   Panic attacks 11/09/2020   Adjustment insomnia 11/09/2020   Pain due to onychomycosis of toenails of both feet 08/08/2019   Panic disorder with agoraphobia and mild panic attacks 06/02/2019   Cervical high risk HPV (human papillomavirus) test positive 04/18/2019   Mass of palm 12/22/2018   Contracture of palmar fascia 12/22/2018   Frequent PVCs  07/15/2017   SOBOE (shortness of breath on exertion) 07/08/2017   Abnormal ECG 04/10/2017   Acute pain of right shoulder 08/18/2016   Depression 05/26/2016   Benign essential HTN 02/14/2015   Dysuria 12/24/2014   Dyspareunia in female 09/28/2014   Atrophic vaginitis 09/28/2014   Urge incontinence 09/28/2014   Renal cyst, left 07/24/2014   Microscopic hematuria 07/24/2014   Headache, tension-type 07/24/2014   Hyperlipidemia associated with type 2 diabetes mellitus ([place]) 07/24/2014   Diabetes mellitus, type 2 ([place]) 07/24/2014    Problem: CAD, HTN, HLD      Long-Range Goal: Disease Progression Prevented or Minimized   Start Date: 11/13/2020  Expected End Date: 02/11/2021  Priority: High  Note:   Objective:  Last practice recorded BP readings:  BP Readings from Last 3 Encounters:  11/09/20 (!) 155/70  05/14/20 (!) 147/81  08/08/19 (!) 160/78   Most recent eGFR/CrCl:  Lab Results  Component Value Date   EGFR 76 05/15/2020    No components found for: CRCL  Lab Results  Component Value Date   CHOL 181 05/15/2020   HDL 75 05/15/2020   LDLCALC 88 05/15/2020   TRIG 100 05/15/2020   CHOLHDL 2.3 04/18/2019    Current Barriers:  Chronic Disease Management support and educational needs related to HTN, HLD and CAD.  Case Manager Clinical Goal(s):  Over the next 90 days, patient will demonstrate improved adherence to prescribed treatment plan as evidenced by taking all medications as prescribed, monitoring and recording blood pressure and adhering to a low sodium/DASH diet.   Interventions:  Collaboration with [name], FNP regarding development and update of comprehensive plan of care as evidenced by provider attestation and co-signature Inter-disciplinary care team collaboration (see longitudinal plan of care) Reviewed medications and discussed compliance with treatment plan. Reports taking all medications as prescribed. Reviewed established BP readings and indications  for notifying a provider. Reports BP readings have been within range. Reports attempting to increase activity/walking but continues to experience fatigue and pain.  Reviewed nutritional intake. Advised to continue monitoring sodium intake and avoid highly processed foods when possible.  Reviewed s/sx of heart attack, stroke and worsening symptoms that require immediate medical attention. Update on 12/25/20: Patient called to update the care management team regarding need for additional provider notes. Confirmed receiving the needed PCP letter for her employer. Reports [employer] is requiring updated provider notes. Unable to confirm if a new packet needs to be submitted. Will review notes and collaborate with the clinic staff regarding need for updated documentation.   Patient Goals/Self-Care Activities: Self-administer medications as prescribed Monitor and record blood pressure Adhere to recommended cardiac prudent/heart healthy diet Notify provider or care management team with questions and new concerns as needed     PLAN A member of the care management team will follow up within the next week.    Cristy Friedlander Health/THN Care Management [facility] [phone]

## 2021-01-02 ENCOUNTER — Encounter: Payer: Self-pay | Admitting: Licensed Clinical Social Worker

## 2021-01-07 ENCOUNTER — Telehealth: Payer: Self-pay

## 2021-01-07 NOTE — Telephone Encounter (Signed)
Copied from [place] [phone]. Topic: General - Other >> Jan 07, 2021  2:43 PM Yvette Rack wrote: Reason for CRM: Pt requests that [facility] send a release to Dr. Linna Caprice with the November 3 office notes. Cb# [phone]

## 2021-01-09 ENCOUNTER — Ambulatory Visit: Payer: Self-pay

## 2021-01-09 DIAGNOSIS — E1129 Type 2 diabetes mellitus with other diabetic kidney complication: Secondary | ICD-10-CM | POA: Diagnosis not present

## 2021-01-09 DIAGNOSIS — R809 Proteinuria, unspecified: Secondary | ICD-10-CM

## 2021-01-09 DIAGNOSIS — I10 Essential (primary) hypertension: Secondary | ICD-10-CM

## 2021-01-09 DIAGNOSIS — E1169 Type 2 diabetes mellitus with other specified complication: Secondary | ICD-10-CM

## 2021-01-09 DIAGNOSIS — E785 Hyperlipidemia, unspecified: Secondary | ICD-10-CM

## 2021-01-09 DIAGNOSIS — R4189 Other symptoms and signs involving cognitive functions and awareness: Secondary | ICD-10-CM

## 2021-01-11 ENCOUNTER — Ambulatory Visit (INDEPENDENT_AMBULATORY_CARE_PROVIDER_SITE_OTHER): Payer: Medicare Other

## 2021-01-11 DIAGNOSIS — I10 Essential (primary) hypertension: Secondary | ICD-10-CM

## 2021-01-11 DIAGNOSIS — R4189 Other symptoms and signs involving cognitive functions and awareness: Secondary | ICD-10-CM

## 2021-01-11 NOTE — Chronic Care Management (AMB) (Signed)
Chronic Care Management   CCM RN Visit Note  01/11/2021 Name: Melissa Arroyo MRN: [id number] DOB: [birth year]  Subjective: Melissa Arroyo is a 70 y.o. year old female who is a primary care patient of [name], FNP. The care management team was consulted for assistance with disease management and care coordination needs.    Engaged with patient by telephone for follow up visit in response to provider referral for case management and care coordination services.   Consent to Services:  The patient was given information about Chronic Care Management services, agreed to services, and gave verbal consent prior to initiation of services.  Please see initial visit note for detailed documentation.    Assessment: Review of patient past medical history, allergies, medications, health status, including review of consultants reports, laboratory and other test data, was performed as part of comprehensive evaluation and provision of chronic care management services.   SDOH (Social Determinants of Health) assessments and interventions performed:  No  CCM Care Plan  Allergies  Allergen Reactions   Vesicare [Solifenacin] Anaphylaxis    Throat Closed   Penicillins Rash    Outpatient Encounter Medications as of 01/11/2021  Medication Sig   Alcohol Swabs (ALCOHOL PREP) PADS Use 1 pad prior to checking blood sugar to clean skin   b complex vitamins capsule Take 1 capsule by mouth daily.   blood glucose meter kit and supplies Dispense based on patient and insurance preference. Check glucose once daily. (FOR ICD-10 E11.29).   buPROPion (WELLBUTRIN XL) 300 MG 24 hr tablet Take 300 mg by mouth every morning.   chlorhexidine (PERIDEX) 0.12 % solution SMARTSIG:By Mouth   Cholecalciferol (VITAMIN D3) 10 MCG (400 UNIT) tablet Take by mouth daily. Dose unknown   dicyclomine (BENTYL) 10 MG capsule Take 1 capsule (10 mg total) by mouth 4 (four) times daily -  before meals and at bedtime.   estradiol  (ESTRACE) 0.1 MG/GM vaginal cream Place 1 Applicatorful vaginally at bedtime.   FLUoxetine (PROZAC) 20 MG capsule Take 3 capsules (60 mg total) by mouth daily.   fluticasone (FLONASE SENSIMIST) 27.5 MCG/SPRAY nasal spray Place 1-2 sprays into the nose daily.   glipiZIDE (GLUCOTROL XL) 10 MG 24 hr tablet TAKE 1 TABLET(10 MG) BY MOUTH DAILY WITH BREAKFAST   Glucosamine-Chondroitin (MOVE FREE PO) Take 1 tablet by mouth daily.   ibuprofen (ADVIL,MOTRIN) 200 MG tablet Take 400 mg by mouth at bedtime. As needed   lovastatin (MEVACOR) 40 MG tablet TAKE 1 TABLET(40 MG) BY MOUTH AT BEDTIME   metFORMIN (GLUCOPHAGE) 1000 MG tablet TAKE 1 TABLET(1000 MG) BY MOUTH TWICE DAILY WITH A MEAL   Multiple Vitamins-Minerals (CENTRUM SILVER PO) Take 1 tablet by mouth daily.   OLANZapine (ZYPREXA) 5 MG tablet Take 7.5 mg by mouth at bedtime.   pioglitazone (ACTOS) 30 MG tablet TAKE 1 TABLET(30 MG) BY MOUTH DAILY   propranolol (INDERAL) 10 MG tablet TAKE 1 TABLET(10 MG) BY MOUTH TWICE DAILY   zinc gluconate 50 MG tablet Take by mouth daily. Dose unknown   No facility-administered encounter medications on file as of 01/11/2021.    Patient Active Problem List   Diagnosis Date Noted   Social anxiety disorder 12/27/2020   Alcohol use disorder, moderate, in sustained remission ([place]) 12/27/2020   Polyp of colon    GAD (generalized anxiety disorder) 11/19/2020   Family history of colon cancer 11/19/2020   Attention and concentration deficit 11/09/2020   Chronic bilateral low back pain without sciatica 11/09/2020  IBD (inflammatory bowel disease) 11/09/2020   Panic attacks 11/09/2020   Adjustment insomnia 11/09/2020   Pain due to onychomycosis of toenails of both feet 08/08/2019   Panic disorder with agoraphobia and mild panic attacks 06/02/2019   Cervical high risk HPV (human papillomavirus) test positive 04/18/2019   Mass of palm 12/22/2018   Contracture of palmar fascia 12/22/2018   Frequent PVCs 07/15/2017    SOBOE (shortness of breath on exertion) 07/08/2017   Abnormal ECG 04/10/2017   Acute pain of right shoulder 08/18/2016   Depression 05/26/2016   Benign essential HTN 02/14/2015   Dysuria 12/24/2014   Dyspareunia in female 09/28/2014   Atrophic vaginitis 09/28/2014   Urge incontinence 09/28/2014   Renal cyst, left 07/24/2014   Microscopic hematuria 07/24/2014   Headache, tension-type 07/24/2014   Hyperlipidemia associated with type 2 diabetes mellitus ([place]) 07/24/2014   Diabetes mellitus, type 2 ([place]) 07/24/2014    Patient Care Plan: Coronary Artery Disease, HTN, HLD     Problem Identified: CAD, HTN, HLD      Long-Range Goal: Disease Progression Prevented or Minimized   Start Date: 11/13/2020  Expected End Date: 02/11/2021  Priority: High  Note:    Current Barriers:  Chronic Disease Management support and educational needs related to HTN, HLD and CAD.  Case Manager Clinical Goal(s):  Over the next 90 days, patient will demonstrate improved adherence to prescribed treatment plan as evidenced by taking all medications as prescribed, monitoring and recording blood pressure and adhering to a low sodium/DASH diet.   Interventions:  Collaboration with [name], FNP regarding development and update of comprehensive plan of care as evidenced by provider attestation and co-signature Inter-disciplinary care team collaboration (see longitudinal plan of care) Reviewed medications and discussed compliance with treatment plan. Reports taking all medications as prescribed. Reviewed established BP readings and indications for notifying a provider. Reports BP readings have been within range. Reports attempting to increase activity/walking but continues to experience fatigue and pain.  Reviewed nutritional intake. Advised to continue monitoring sodium intake and avoid highly processed foods when possible.  Reviewed s/sx of heart attack, stroke and worsening symptoms that require immediate  medical attention. Update on 01/11/21: Follow up regarding patient's request for assistance with disability documentation. Provided verbal consent for the care management team to speak with representatives at [facility].  Successful outreach with [employer], [place]. Discussed required documentation. Representative aware that her PCP is managing short term disability and Neuropsychiatrist is managing long term disability. Provided information for Dr. Clent Ridges office. Confirmed that [place] will fax packet/instructions directly to Dr. Wylene Simmer regarding encounters, tests and required forms.  Provided contact for RNCM. Dalia will call if additional information is required from patient's PCP.  Patient Goals/Self-Care Activities: Self-administer medications as prescribed Monitor and record blood pressure Adhere to recommended cardiac prudent/heart healthy diet Notify provider or care management team with questions and new concerns as needed      PLAN A member of the care management team will follow up next month.   Cristy Friedlander Health/THN Care Management [facility] [phone]

## 2021-01-11 NOTE — Chronic Care Management (AMB) (Signed)
Chronic Care Management   CCM RN Visit Note   Name: Melissa Arroyo MRN: [id number] DOB: 02-24-1950  Subjective: Melissa Arroyo is a 70 y.o. year old female who is a primary care patient of [name], FNP. The care management team was consulted for assistance with disease management and care coordination needs.    Care Coordination was conducted today in response to patient's request for assistance with disability documents.  Consent to Services:  The patient was given information about Chronic Care Management services, agreed to services, and gave verbal consent prior to initiation of services.  Please see initial visit note for detailed documentation.    Assessment: Review of patient past medical history, allergies, medications, health status, including review of consultants reports, laboratory and other test data, was performed as part of comprehensive evaluation and provision of chronic care management services.   SDOH (Social Determinants of Health) assessments and interventions performed: No   CCM Care Plan  Allergies  Allergen Reactions   Vesicare [Solifenacin] Anaphylaxis    Throat Closed   Penicillins Rash    Outpatient Encounter Medications as of 01/09/2021  Medication Sig   Alcohol Swabs (ALCOHOL PREP) PADS Use 1 pad prior to checking blood sugar to clean skin   b complex vitamins capsule Take 1 capsule by mouth daily.   blood glucose meter kit and supplies Dispense based on patient and insurance preference. Check glucose once daily. (FOR ICD-10 E11.29).   buPROPion (WELLBUTRIN XL) 300 MG 24 hr tablet Take 300 mg by mouth every morning.   chlorhexidine (PERIDEX) 0.12 % solution SMARTSIG:By Mouth   Cholecalciferol (VITAMIN D3) 10 MCG (400 UNIT) tablet Take by mouth daily. Dose unknown   dicyclomine (BENTYL) 10 MG capsule Take 1 capsule (10 mg total) by mouth 4 (four) times daily -  before meals and at bedtime.   estradiol (ESTRACE) 0.1 MG/GM vaginal cream Place 1  Applicatorful vaginally at bedtime.   FLUoxetine (PROZAC) 20 MG capsule Take 3 capsules (60 mg total) by mouth daily.   fluticasone (FLONASE SENSIMIST) 27.5 MCG/SPRAY nasal spray Place 1-2 sprays into the nose daily.   glipiZIDE (GLUCOTROL XL) 10 MG 24 hr tablet TAKE 1 TABLET(10 MG) BY MOUTH DAILY WITH BREAKFAST   Glucosamine-Chondroitin (MOVE FREE PO) Take 1 tablet by mouth daily.   ibuprofen (ADVIL,MOTRIN) 200 MG tablet Take 400 mg by mouth at bedtime. As needed   lovastatin (MEVACOR) 40 MG tablet TAKE 1 TABLET(40 MG) BY MOUTH AT BEDTIME   metFORMIN (GLUCOPHAGE) 1000 MG tablet TAKE 1 TABLET(1000 MG) BY MOUTH TWICE DAILY WITH A MEAL   Multiple Vitamins-Minerals (CENTRUM SILVER PO) Take 1 tablet by mouth daily.   OLANZapine (ZYPREXA) 5 MG tablet Take 7.5 mg by mouth at bedtime.   pioglitazone (ACTOS) 30 MG tablet TAKE 1 TABLET(30 MG) BY MOUTH DAILY   propranolol (INDERAL) 10 MG tablet TAKE 1 TABLET(10 MG) BY MOUTH TWICE DAILY   zinc gluconate 50 MG tablet Take by mouth daily. Dose unknown   No facility-administered encounter medications on file as of 01/09/2021.    Patient Active Problem List   Diagnosis Date Noted   Social anxiety disorder 12/27/2020   Alcohol use disorder, moderate, in sustained remission ([place]) 12/27/2020   Polyp of colon    GAD (generalized anxiety disorder) 11/19/2020   Family history of colon cancer 11/19/2020   Attention and concentration deficit 11/09/2020   Chronic bilateral low back pain without sciatica 11/09/2020   IBD (inflammatory bowel disease) 11/09/2020  Panic attacks 11/09/2020   Adjustment insomnia 11/09/2020   Pain due to onychomycosis of toenails of both feet 08/08/2019   Panic disorder with agoraphobia and mild panic attacks 06/02/2019   Cervical high risk HPV (human papillomavirus) test positive 04/18/2019   Mass of palm 12/22/2018   Contracture of palmar fascia 12/22/2018   Frequent PVCs 07/15/2017   SOBOE (shortness of breath on exertion)  07/08/2017   Abnormal ECG 04/10/2017   Acute pain of right shoulder 08/18/2016   Depression 05/26/2016   Benign essential HTN 02/14/2015   Dysuria 12/24/2014   Dyspareunia in female 09/28/2014   Atrophic vaginitis 09/28/2014   Urge incontinence 09/28/2014   Renal cyst, left 07/24/2014   Microscopic hematuria 07/24/2014   Headache, tension-type 07/24/2014   Hyperlipidemia associated with type 2 diabetes mellitus ([place]) 07/24/2014   Diabetes mellitus, type 2 ([place]) 07/24/2014      Care Plan : Coronary Artery Disease, HTN, HLD       Problem: CAD, HTN, HLD      Long-Range Goal: Disease Progression Prevented or Minimized   Start Date: 11/13/2020  Expected End Date: 02/11/2021  Priority: High  Note:    Current Barriers:  Chronic Disease Management support and educational needs related to HTN, HLD and CAD.  Case Manager Clinical Goal(s):  Over the next 90 days, patient will demonstrate improved adherence to prescribed treatment plan as evidenced by taking all medications as prescribed, monitoring and recording blood pressure and adhering to a low sodium/DASH diet.   Interventions:  Collaboration with [name], FNP regarding development and update of comprehensive plan of care as evidenced by provider attestation and co-signature Inter-disciplinary care team collaboration (see longitudinal plan of care) Reviewed medications and discussed compliance with treatment plan. Reports taking all medications as prescribed. Reviewed established BP readings and indications for notifying a provider. Reports BP readings have been within range. Reports attempting to increase activity/walking but continues to experience fatigue and pain.  Reviewed nutritional intake. Advised to continue monitoring sodium intake and avoid highly processed foods when possible.  Reviewed s/sx of heart attack, stroke and worsening symptoms that require immediate medical attention. Update on 01/09/21: Care  Coordination r/t patient's request for assistance with disability documents. Letter for leave extension was previously submitted by PCP. Currently pending updated forms from [facility]. PCP will complete the updated forms to request extension for short term disability once they are received. Per chart review and previous conversation with patient, Dr. Clent Ridges team will submit the request for long term disability. Will follow up with patient later this week to review appointments and confirm required encounters are submitted.  Patient Goals/Self-Care Activities: Self-administer medications as prescribed Monitor and record blood pressure Adhere to recommended cardiac prudent/heart healthy diet Notify provider or care management team with questions and new concerns as needed      PLAN A member of the care management team will follow up within the next week.   Cristy Friedlander [facility] Care Management [facility] [phone]

## 2021-01-17 DIAGNOSIS — E669 Obesity, unspecified: Secondary | ICD-10-CM | POA: Diagnosis not present

## 2021-01-17 DIAGNOSIS — Z20822 Contact with and (suspected) exposure to covid-19: Secondary | ICD-10-CM | POA: Diagnosis not present

## 2021-01-17 DIAGNOSIS — R6889 Other general symptoms and signs: Secondary | ICD-10-CM | POA: Diagnosis not present

## 2021-01-17 DIAGNOSIS — R4189 Other symptoms and signs involving cognitive functions and awareness: Secondary | ICD-10-CM | POA: Diagnosis not present

## 2021-01-17 DIAGNOSIS — F419 Anxiety disorder, unspecified: Secondary | ICD-10-CM | POA: Diagnosis not present

## 2021-01-21 ENCOUNTER — Other Ambulatory Visit (HOSPITAL_COMMUNITY): Payer: Self-pay | Admitting: Neurology

## 2021-01-21 DIAGNOSIS — R419 Unspecified symptoms and signs involving cognitive functions and awareness: Secondary | ICD-10-CM

## 2021-01-24 DIAGNOSIS — F3176 Bipolar disorder, in full remission, most recent episode depressed: Secondary | ICD-10-CM | POA: Diagnosis not present

## 2021-01-24 DIAGNOSIS — F3341 Major depressive disorder, recurrent, in partial remission: Secondary | ICD-10-CM | POA: Diagnosis not present

## 2021-01-24 DIAGNOSIS — F4011 Social phobia, generalized: Secondary | ICD-10-CM | POA: Diagnosis not present

## 2021-01-24 DIAGNOSIS — F900 Attention-deficit hyperactivity disorder, predominantly inattentive type: Secondary | ICD-10-CM | POA: Diagnosis not present

## 2021-01-24 DIAGNOSIS — F410 Panic disorder [episodic paroxysmal anxiety] without agoraphobia: Secondary | ICD-10-CM | POA: Diagnosis not present

## 2021-01-28 ENCOUNTER — Other Ambulatory Visit: Payer: Self-pay | Admitting: Family Medicine

## 2021-01-28 DIAGNOSIS — R928 Other abnormal and inconclusive findings on diagnostic imaging of breast: Secondary | ICD-10-CM

## 2021-01-28 DIAGNOSIS — N63 Unspecified lump in unspecified breast: Secondary | ICD-10-CM

## 2021-01-29 ENCOUNTER — Other Ambulatory Visit: Payer: Self-pay

## 2021-01-29 ENCOUNTER — Ambulatory Visit (HOSPITAL_COMMUNITY)
Admission: RE | Admit: 2021-01-29 | Discharge: 2021-01-29 | Disposition: A | Discharge status: Home / Self Care | Payer: Medicare Other | Source: Ambulatory Visit | Attending: Neurology | Admitting: Neurology

## 2021-01-29 DIAGNOSIS — R419 Unspecified symptoms and signs involving cognitive functions and awareness: Secondary | ICD-10-CM

## 2021-01-29 DIAGNOSIS — G319 Degenerative disease of nervous system, unspecified: Secondary | ICD-10-CM | POA: Diagnosis not present

## 2021-02-05 ENCOUNTER — Ambulatory Visit: Payer: Medicare Other

## 2021-02-05 DIAGNOSIS — E785 Hyperlipidemia, unspecified: Secondary | ICD-10-CM

## 2021-02-05 DIAGNOSIS — R809 Proteinuria, unspecified: Secondary | ICD-10-CM

## 2021-02-05 DIAGNOSIS — I10 Essential (primary) hypertension: Secondary | ICD-10-CM

## 2021-02-05 DIAGNOSIS — F411 Generalized anxiety disorder: Secondary | ICD-10-CM

## 2021-02-05 NOTE — Chronic Care Management (AMB) (Signed)
Chronic Care Management   CCM RN Visit Note  02/05/2021 Name: Melissa Arroyo MRN: [id number] DOB: 05-Aug-1950  Subjective: Melissa Arroyo is a 70 y.o. year old female who is a primary care patient of [name], FNP. The care management team was consulted for assistance with disease management and care coordination needs.    Engaged with patient by telephone for follow up visit in response to provider referral for case management and care coordination services.   Consent to Services:  The patient was given information about Chronic Care Management services, agreed to services, and gave verbal consent prior to initiation of services.  Please see initial visit note for detailed documentation.   Assessment: Review of patient past medical history, allergies, medications, health status, including review of consultants reports, laboratory and other test data, was performed as part of comprehensive evaluation and provision of chronic care management services.   SDOH (Social Determinants of Health) assessments and interventions performed: No  CCM Care Plan  Allergies  Allergen Reactions   Vesicare [Solifenacin] Anaphylaxis    Throat Closed   Penicillins Rash    Outpatient Encounter Medications as of 02/05/2021  Medication Sig   b complex vitamins capsule Take 1 capsule by mouth daily.   buPROPion (WELLBUTRIN XL) 300 MG 24 hr tablet Take 300 mg by mouth every morning.   Cholecalciferol (VITAMIN D3) 10 MCG (400 UNIT) tablet Take by mouth daily. Dose unknown   FLUoxetine (PROZAC) 20 MG capsule Take 3 capsules (60 mg total) by mouth daily.   fluticasone (FLONASE SENSIMIST) 27.5 MCG/SPRAY nasal spray Place 1-2 sprays into the nose daily.   glipiZIDE (GLUCOTROL XL) 10 MG 24 hr tablet TAKE 1 TABLET(10 MG) BY MOUTH DAILY WITH BREAKFAST   Glucosamine-Chondroitin (MOVE FREE PO) Take 1 tablet by mouth daily.   ibuprofen (ADVIL,MOTRIN) 200 MG tablet Take 400 mg by mouth at bedtime. As needed    lovastatin (MEVACOR) 40 MG tablet TAKE 1 TABLET(40 MG) BY MOUTH AT BEDTIME   metFORMIN (GLUCOPHAGE) 1000 MG tablet TAKE 1 TABLET(1000 MG) BY MOUTH TWICE DAILY WITH A MEAL   Multiple Vitamins-Minerals (CENTRUM SILVER PO) Take 1 tablet by mouth daily.   OLANZapine (ZYPREXA) 5 MG tablet Take 7.5 mg by mouth at bedtime.   pioglitazone (ACTOS) 30 MG tablet TAKE 1 TABLET(30 MG) BY MOUTH DAILY   propranolol (INDERAL) 10 MG tablet TAKE 1 TABLET(10 MG) BY MOUTH TWICE DAILY   zinc gluconate 50 MG tablet Take by mouth daily. Dose unknown   Alcohol Swabs (ALCOHOL PREP) PADS Use 1 pad prior to checking blood sugar to clean skin   blood glucose meter kit and supplies Dispense based on patient and insurance preference. Check glucose once daily. (FOR ICD-10 E11.29).   chlorhexidine (PERIDEX) 0.12 % solution SMARTSIG:By Mouth   dicyclomine (BENTYL) 10 MG capsule Take 1 capsule (10 mg total) by mouth 4 (four) times daily -  before meals and at bedtime. (Patient not taking: Reported on 02/05/2021)   estradiol (ESTRACE) 0.1 MG/GM vaginal cream Place 1 Applicatorful vaginally at bedtime.   No facility-administered encounter medications on file as of 02/05/2021.    Patient Active Problem List   Diagnosis Date Noted   Social anxiety disorder 12/27/2020   Alcohol use disorder, moderate, in sustained remission ([place]) 12/27/2020   Polyp of colon    GAD (generalized anxiety disorder) 11/19/2020   Family history of colon cancer 11/19/2020   Attention and concentration deficit 11/09/2020   Chronic bilateral low back pain  without sciatica 11/09/2020   IBD (inflammatory bowel disease) 11/09/2020   Panic attacks 11/09/2020   Adjustment insomnia 11/09/2020   Pain due to onychomycosis of toenails of both feet 08/08/2019   Panic disorder with agoraphobia and mild panic attacks 06/02/2019   Cervical high risk HPV (human papillomavirus) test positive 04/18/2019   Mass of palm 12/22/2018   Contracture of palmar fascia  12/22/2018   Frequent PVCs 07/15/2017   SOBOE (shortness of breath on exertion) 07/08/2017   Abnormal ECG 04/10/2017   Acute pain of right shoulder 08/18/2016   Depression 05/26/2016   Benign essential HTN 02/14/2015   Dysuria 12/24/2014   Dyspareunia in female 09/28/2014   Atrophic vaginitis 09/28/2014   Urge incontinence 09/28/2014   Renal cyst, left 07/24/2014   Microscopic hematuria 07/24/2014   Headache, tension-type 07/24/2014   Hyperlipidemia associated with type 2 diabetes mellitus ([place]) 07/24/2014   Diabetes mellitus, type 2 ([place]) 07/24/2014      Patient Care Plan: RN Care Management Plan of Care     Problem Identified: CAD, HTN, HLD, DM, Depression and Generalized Anxiety Disorder      Long-Range Goal: Disease Progression Prevented or Minimized   Start Date: 02/05/2021  Expected End Date: 05/06/2021  Priority: High  Note:   Current Barriers:  Chronic Disease Management support and education needs related to CAD, HTN, DMII, Depression, and Generalized Anxiety Disorder.  RNCM Clinical Goal(s):  Patient will demonstrate Ongoing adherence to prescribed treatment plan for CAD, HTN, HLD, DMII, Anxiety, and Depression through collaboration with the provider,  RN Care Manager and the care team.   Interventions: 1:1 collaboration with primary care provider regarding development and update of comprehensive plan of care as evidenced by provider attestation and co-signature Inter-disciplinary care team collaboration (see longitudinal plan of care) Evaluation of current treatment plan related to  self management and patient's adherence to plan as established by provider   CAD Interventions: (Status:  Goal on track:  Yes.) Long Term Goal Medications reviewed including medications utilized in CAD treatment plan Reviewed importance of blood pressure control in management of CAD Reviewed importance of limiting foods high in cholesterol Reviewed importance of regular laboratory  monitoring as prescribed Reviewed Importance of taking all medications as prescribed Reviewed Importance of attending all scheduled provider appointments   Diabetes Interventions:  (Status:  Goal on track:  Yes.) Long Term Goal Assessed patient's understanding of A1c goal: <7% Lab Results  Component Value Date   HGBA1C 6.7 (H) 11/19/2020  Reviewed plan for diabetes management. Her A1C is currently at goal. Reports not monitoring blood glucose levels daily but reports doing well with overall diabetes management. Reports completing preventive exams as advised. Declined need for additional diabetes education resources.    Depression and Generalized Anxiety Disorder Reviewed current treatment plan. Reports taking medications and completing clinic visits as scheduled. Reports symptoms have been well controlled with prescribed medications. She continues to experience symptoms that she describes as "brain fog" but overall feels that she is managing well. Notes symptoms/stress has significantly declined since being away from her previous work environment. Reports resting as advised.  Continues to follow up with Dr. Shea Evans and Dr. Benita Gutter for behavioral health and psychiatry needs. Reports Dr. Gaulteri/Neuropsychiatry will continue to complete needed exams/test required for long term disability.    Hyperlipidemia Interventions:   Lab Results  Component Value Date   CHOL 280 (H) 11/19/2020   HDL 81 11/19/2020   LDLCALC 174 (H) 11/19/2020   TRIG 140 11/19/2020  CHOLHDL 3.5 11/19/2020  Medications reviewed Reviewed provider established cholesterol goals Counseled on importance of regular laboratory monitoring as prescribed Reviewed strategies to manage statin-induced myalgias Reviewed importance of limiting foods high in cholesterol  Hypertension Interventions:  (Status:  Goal on track:  Yes.) Long Term Goal Last practice recorded BP readings:  BP Readings from Last 3 Encounters:  12/03/20  124/70  11/27/20 (!) 145/71  11/19/20 (!) 135/58  Most recent eGFR/CrCl:  Lab Results  Component Value Date   EGFR 72 11/19/2020    No components found for: CRCL  Reviewed medications and indications for use. Reports taking all medications as prescribed. Provided information regarding established blood pressure parameters along with indications for notifying a provider. Currently not monitoring her BP at home. Advised to monitor and record readings.  Discussed compliance with recommended cardiac prudent diet. Encouraged to read nutrition labels and avoid highly processed foods when possible. Discussed complications of uncontrolled blood pressure.  Reviewed s/sx of heart attack, stroke and worsening symptoms that require immediate medical attention.   Patient Goals/Self-Care Activities: Take all medications as prescribed Attend all scheduled provider appointments Call pharmacy for medication refills 3-7 days in advance of running out of medications Perform all self care activities independently  Perform IADL's (shopping, preparing meals, housekeeping, managing finances) independently Call provider office for new concerns or questions  Work with the Neuropsychiatry team to address needs related to long term disability.   Follow Up Plan:   Will follow up next month       PLAN A member of the care management team will follow up next month.   Cristy Friedlander [facility] Care Management [facility] [phone]

## 2021-02-05 NOTE — Patient Instructions (Signed)
Thank you for allowing the Chronic Care Management team to participate in your care. It was a pleasure speaking with you today!

## 2021-02-07 ENCOUNTER — Ambulatory Visit: Payer: Medicare Other | Admitting: Licensed Clinical Social Worker

## 2021-02-09 DIAGNOSIS — E1129 Type 2 diabetes mellitus with other diabetic kidney complication: Secondary | ICD-10-CM | POA: Diagnosis not present

## 2021-02-09 DIAGNOSIS — I10 Essential (primary) hypertension: Secondary | ICD-10-CM | POA: Diagnosis not present

## 2021-02-09 DIAGNOSIS — E1169 Type 2 diabetes mellitus with other specified complication: Secondary | ICD-10-CM | POA: Diagnosis not present

## 2021-02-09 DIAGNOSIS — R809 Proteinuria, unspecified: Secondary | ICD-10-CM

## 2021-02-09 DIAGNOSIS — E785 Hyperlipidemia, unspecified: Secondary | ICD-10-CM | POA: Diagnosis not present

## 2021-02-13 ENCOUNTER — Telehealth: Payer: Self-pay | Admitting: Gastroenterology

## 2021-02-13 NOTE — Telephone Encounter (Signed)
Left message on voicemail.

## 2021-02-13 NOTE — Telephone Encounter (Signed)
Inbound call from pt stating that she has a colonoscopy with Dr. Bonna Gains and 2 polyps was removed. PT stated that her insurance company told her a code will need to be changed because she was under the impression that [facility] will pay for her procedure. Please advise. If I have routed this to the wrong CMA please let me know. Thank you.

## 2021-02-19 ENCOUNTER — Ambulatory Visit (INDEPENDENT_AMBULATORY_CARE_PROVIDER_SITE_OTHER): Payer: Medicare Other | Admitting: Family Medicine

## 2021-02-19 ENCOUNTER — Other Ambulatory Visit: Payer: Self-pay

## 2021-02-19 ENCOUNTER — Encounter: Payer: Self-pay | Admitting: Family Medicine

## 2021-02-19 VITALS — BP 138/62 | HR 71 | Resp 16 | Ht 65.0 in | Wt 205.2 lb

## 2021-02-19 DIAGNOSIS — E1159 Type 2 diabetes mellitus with other circulatory complications: Secondary | ICD-10-CM

## 2021-02-19 DIAGNOSIS — J9801 Acute bronchospasm: Secondary | ICD-10-CM

## 2021-02-19 DIAGNOSIS — E1169 Type 2 diabetes mellitus with other specified complication: Secondary | ICD-10-CM

## 2021-02-19 DIAGNOSIS — Z23 Encounter for immunization: Secondary | ICD-10-CM | POA: Diagnosis not present

## 2021-02-19 DIAGNOSIS — R7303 Prediabetes: Secondary | ICD-10-CM | POA: Insufficient documentation

## 2021-02-19 DIAGNOSIS — I10 Essential (primary) hypertension: Secondary | ICD-10-CM | POA: Insufficient documentation

## 2021-02-19 DIAGNOSIS — E1129 Type 2 diabetes mellitus with other diabetic kidney complication: Secondary | ICD-10-CM

## 2021-02-19 DIAGNOSIS — E785 Hyperlipidemia, unspecified: Secondary | ICD-10-CM | POA: Diagnosis not present

## 2021-02-19 DIAGNOSIS — L602 Onychogryphosis: Secondary | ICD-10-CM

## 2021-02-19 DIAGNOSIS — I152 Hypertension secondary to endocrine disorders: Secondary | ICD-10-CM

## 2021-02-19 DIAGNOSIS — R809 Proteinuria, unspecified: Secondary | ICD-10-CM | POA: Diagnosis not present

## 2021-02-19 DIAGNOSIS — B351 Tinea unguium: Secondary | ICD-10-CM

## 2021-02-19 DIAGNOSIS — R928 Other abnormal and inconclusive findings on diagnostic imaging of breast: Secondary | ICD-10-CM

## 2021-02-19 DIAGNOSIS — Z0000 Encounter for general adult medical examination without abnormal findings: Secondary | ICD-10-CM

## 2021-02-19 DIAGNOSIS — E039 Hypothyroidism, unspecified: Secondary | ICD-10-CM | POA: Diagnosis not present

## 2021-02-19 MED ORDER — ZOSTER VAC RECOMB ADJUVANTED 50 MCG/0.5ML IM SUSR: 0.5000 mL | Freq: Once | INTRAMUSCULAR | 0 refills | Status: AC

## 2021-02-19 NOTE — Assessment & Plan Note (Signed)
Education provided on importance of prevention in the aging adult given hx of chicken pox/varicella virus Paper prescription provided

## 2021-02-19 NOTE — Progress Notes (Signed)
Annual Wellness Visit     Patient: Melissa Arroyo, Female    DOB: [birth year], 71 y.o.   MRN: [id number] Visit Date: 02/19/2021  Today's Provider: [name], FNP   Chief Complaint  Patient presents with   Medicare Wellness   Subjective     HPI Melissa Arroyo is a 71 y.o. female who presents today for her Annual Wellness Visit. She reports consuming a general diet. Home exercise routine includes walking. She generally feels well. She reports sleeping well. She does have additional problems to discuss today, patient reports cough for the past 10 days. Patient reports that she has taken Mucinex, Diabetic cough syrup, Robitussin PM  and Ibuprofen 466m.  Last Reported Colonoscopy-11/27/2020 Pap 3-/28/2019 HPV high risk POSITIVE BMD- 04/30/2017 Mammo-08/30/2020 Medications: Outpatient Medications Prior to Visit  Medication Sig   ACCU-CHEK GUIDE test strip USE TO TEST ONCE DAILY   Alcohol Swabs (ALCOHOL PREP) PADS Use 1 pad prior to checking blood sugar to clean skin   b complex vitamins capsule Take 1 capsule by mouth daily.   blood glucose meter kit and supplies Dispense based on patient and insurance preference. Check glucose once daily. (FOR ICD-10 E11.29).   buPROPion (WELLBUTRIN XL) 300 MG 24 hr tablet Take 300 mg by mouth every morning.   chlorhexidine (PERIDEX) 0.12 % solution SMARTSIG:By Mouth   Cholecalciferol (VITAMIN D3) 10 MCG (400 UNIT) tablet Take by mouth daily. Dose unknown   dicyclomine (BENTYL) 10 MG capsule Take 1 capsule (10 mg total) by mouth 4 (four) times daily -  before meals and at bedtime.   estradiol (ESTRACE) 0.1 MG/GM vaginal cream Place 1 Applicatorful vaginally at bedtime.   FLUoxetine (PROZAC) 20 MG capsule Take 3 capsules (60 mg total) by mouth daily.   FLUoxetine (PROZAC) 40 MG capsule Take 40 mg by mouth daily.   fluticasone (FLONASE SENSIMIST) 27.5 MCG/SPRAY nasal spray Place 1-2 sprays into the nose daily.   glipiZIDE  (GLUCOTROL XL) 10 MG 24 hr tablet TAKE 1 TABLET(10 MG) BY MOUTH DAILY WITH BREAKFAST   Glucosamine-Chondroitin (MOVE FREE PO) Take 1 tablet by mouth daily.   ibuprofen (ADVIL,MOTRIN) 200 MG tablet Take 400 mg by mouth at bedtime. As needed   lovastatin (MEVACOR) 40 MG tablet TAKE 1 TABLET(40 MG) BY MOUTH AT BEDTIME   metFORMIN (GLUCOPHAGE) 1000 MG tablet TAKE 1 TABLET(1000 MG) BY MOUTH TWICE DAILY WITH A MEAL   Multiple Vitamins-Minerals (CENTRUM SILVER PO) Take 1 tablet by mouth daily.   OLANZapine (ZYPREXA) 5 MG tablet Take 7.5 mg by mouth at bedtime.   pioglitazone (ACTOS) 30 MG tablet TAKE 1 TABLET(30 MG) BY MOUTH DAILY   propranolol (INDERAL) 10 MG tablet TAKE 1 TABLET(10 MG) BY MOUTH TWICE DAILY   zinc gluconate 50 MG tablet Take by mouth daily. Dose unknown   No facility-administered medications prior to visit.    Allergies  Allergen Reactions   Vesicare [Solifenacin] Anaphylaxis    Throat Closed   Penicillins Rash    Patient Care Team: PGwyneth Sprout FNP as PCP - General (Family Medicine) KCorey Skains MD as Consulting Physician (Cardiology) GSharia Reeve MD as Referring Physician (Psychiatry) KArelia Sneddon OParis(Optometry) MNeldon Labella RN as Case Manager  Review of Systems  HENT:  Positive for congestion.   Respiratory:  Positive for cough.   Musculoskeletal:  Positive for myalgias.  Psychiatric/Behavioral:  Positive for confusion and decreased concentration. The patient is nervous/anxious.   All other systems reviewed and  are negative.  Pt reports all psych complaints are chronic are     Objective    Vitals: BP 138/62    Pulse 71    Resp 16    Ht _0  (1.651 m)    Wt 205 lb 3.2 oz (93.1 kg)    SpO2 98%    BMI 34.15 kg/m    Physical Exam Vitals and nursing note reviewed.  Constitutional:      General: She is awake. She is not in acute distress.    Appearance: Normal appearance. She is well-developed and well-groomed. She is  obese. She is not ill-appearing, toxic-appearing or diaphoretic.  HENT:     Head: Normocephalic and atraumatic.     Jaw: There is normal jaw occlusion. No trismus, tenderness, swelling or pain on movement.     Right Ear: Hearing, tympanic membrane, ear canal and external ear normal. There is no impacted cerumen.     Left Ear: Hearing, tympanic membrane, ear canal and external ear normal. There is no impacted cerumen.     Nose: Nose normal. No congestion or rhinorrhea.     Right Turbinates: Not enlarged, swollen or pale.     Left Turbinates: Not enlarged, swollen or pale.     Right Sinus: No maxillary sinus tenderness or frontal sinus tenderness.     Left Sinus: No maxillary sinus tenderness or frontal sinus tenderness.     Mouth/Throat:     Lips: Pink.     Mouth: Mucous membranes are moist. No injury.     Tongue: No lesions.     Pharynx: Oropharynx is clear. Uvula midline. No pharyngeal swelling, oropharyngeal exudate, posterior oropharyngeal erythema or uvula swelling.     Tonsils: No tonsillar exudate or tonsillar abscesses.  Eyes:     General: Lids are normal. Lids are everted, no foreign bodies appreciated. Vision grossly intact. Gaze aligned appropriately. No allergic shiner or visual field deficit.       Right eye: No discharge.        Left eye: No discharge.     Extraocular Movements: Extraocular movements intact.     Conjunctiva/sclera: Conjunctivae normal.     Right eye: Right conjunctiva is not injected. No exudate.    Left eye: Left conjunctiva is not injected. No exudate.    Pupils: Pupils are equal, round, and reactive to light.  Neck:     Thyroid: No thyroid mass, thyromegaly or thyroid tenderness.     Vascular: No carotid bruit.     Trachea: Trachea normal.  Cardiovascular:     Rate and Rhythm: Normal rate and regular rhythm.     Pulses: Normal pulses.          Carotid pulses are 2+ on the right side and 2+ on the left side.      Radial pulses are 2+ on the right side  and 2+ on the left side.       Dorsalis pedis pulses are 2+ on the right side and 2+ on the left side.       Posterior tibial pulses are 2+ on the right side and 2+ on the left side.     Heart sounds: Normal heart sounds, S1 normal and S2 normal. No murmur heard.   No friction rub. No gallop.  Pulmonary:     Effort: Pulmonary effort is normal. No respiratory distress.     Breath sounds: Normal breath sounds and air entry. No stridor. No wheezing, rhonchi or rales.  Chest:  Chest wall: No tenderness.     Comments: Breast exam deferred; discussed self exam Abdominal:     General: Abdomen is flat. Bowel sounds are normal. There is no distension.     Palpations: Abdomen is soft. There is no mass.     Tenderness: There is no abdominal tenderness. There is no right CVA tenderness, left CVA tenderness, guarding or rebound.     Hernia: No hernia is present.  Genitourinary:    Comments: Exam deferred; denies complaints Musculoskeletal:        General: No swelling, tenderness, deformity or signs of injury. Normal range of motion.     Cervical back: Full passive range of motion without pain, normal range of motion and neck supple. No edema, rigidity or tenderness. No muscular tenderness.     Right lower leg: No edema.     Left lower leg: No edema.  Lymphadenopathy:     Cervical: No cervical adenopathy.     Right cervical: No superficial, deep or posterior cervical adenopathy.    Left cervical: No superficial, deep or posterior cervical adenopathy.  Skin:    General: Skin is warm and dry.     Capillary Refill: Capillary refill takes less than 2 seconds.     Coloration: Skin is not jaundiced or pale.     Findings: No bruising, erythema, lesion or rash.  Neurological:     General: No focal deficit present.     Mental Status: She is alert and oriented to person, place, and time. Mental status is at baseline.     GCS: GCS eye subscore is 4. GCS verbal subscore is 5. GCS motor subscore is 6.      Sensory: Sensation is intact. No sensory deficit.     Motor: Motor function is intact. No weakness.     Coordination: Coordination is intact. Coordination normal.     Gait: Gait is intact. Gait normal.  Psychiatric:        Attention and Perception: Attention and perception normal.        Mood and Affect: Mood and affect normal.        Speech: Speech normal.        Behavior: Behavior normal. Behavior is cooperative.        Thought Content: Thought content normal.        Cognition and Memory: Cognition and memory normal.        Judgment: Judgment normal.    Most recent functional status assessment: In your present state of health, do you have any difficulty performing the following activities: 11/19/2020  Hearing? N  Vision? N  Difficulty concentrating or making decisions? Y  Walking or climbing stairs? Y  Dressing or bathing? N  Doing errands, shopping? N  Some recent data might be hidden   Most recent fall risk assessment: Fall Risk  11/13/2020  Falls in the past year? 0  Comment -  Number falls in past yr: 0  Comment -  Injury with Fall? -  Risk for fall due to : Impaired balance/gait;Medication side effect  Follow up Falls prevention discussed    Most recent depression screenings: PHQ 2/9 Scores 12/03/2020 11/19/2020  PHQ - 2 Score 2 3  PHQ- 9 Score 6 9  Some encounter information is confidential and restricted. Go to Review Flowsheets activity to see all data.   Most recent cognitive screening: 6CIT Screen 11/19/2020  What Year? 0 points  What month? 0 points  What time? 0 points  Count back from  20 0 points  Months in reverse 0 points  Repeat phrase 0 points  Total Score 0   Most recent Audit-C alcohol use screening Alcohol Use Disorder Test (AUDIT) 11/13/2020  1. How often do you have a drink containing alcohol? 0  2. How many drinks containing alcohol do you have on a typical day when you are drinking? -  3. How often do you have six or more drinks on one  occasion? 0  AUDIT-C Score -   A score of 3 or more in women, and 4 or more in men indicates increased risk for alcohol abuse, EXCEPT if all of the points are from question 1   No results found for any visits on 02/19/21.  Assessment & Plan     Annual wellness visit done today including the all of the following: Reviewed patient's Family Medical History Reviewed and updated list of patient's medical providers Assessment of cognitive impairment was done Assessed patient's functional ability Established a written schedule for health screening [place] Completed and Reviewed  Exercise Activities and Dietary recommendations  Goals      DIET - INCREASE WATER INTAKE     Recommend to drink at least 6-8 8oz glasses of water per day.        Immunization History  Administered Date(s) Administered   Fluad Quad(high Dose 65+) 12/28/2018, 10/20/2019, 11/19/2020   Influenza, High Dose Seasonal PF 10/19/2017   Influenza,inj,Quad PF,6+ Mos 12/03/2016   Influenza-Unspecified 12/01/2015   Moderna Covid-19 Vaccine Bivalent Booster 40yr & up 12/01/2020   PFIZER Comirnaty(Gray Top)Covid-19 Tri-Sucrose Vaccine 04/25/2019, 05/16/2019   Pneumococcal Conjugate-13 12/01/2015   Pneumococcal Polysaccharide-23 03/30/2017   Tdap 03/30/2017    Health Maintenance  Topic Date Due   Zoster Vaccines- Shingrix (1 of 2) Never done   FOOT EXAM  12/28/2019   URINE MICROALBUMIN  04/17/2020   COVID-19 Vaccine (3 - Pfizer risk series) 12/01/2020   HEMOGLOBIN A1C  05/20/2021   OPHTHALMOLOGY EXAM  08/09/2021   MAMMOGRAM  08/31/2022   COLONOSCOPY (Pts 45-464yrInsurance coverage will need to be confirmed)  11/27/2025   TETANUS/TDAP  03/31/2027   Pneumonia Vaccine 6561Years old  Completed   INFLUENZA VACCINE  Completed   DEXA SCAN  Completed   Hepatitis C Screening  Completed   HPV VACCINES  Aged Out   Fecal DNA (Cologuard)  Discontinued     Discussed  health benefits of physical activity, and encouraged her to engage in regular exercise appropriate for her age and condition.    Problem List Items Addressed This Visit       Cardiovascular and Mediastinum   Hypertension associated with diabetes ([place]   Chronic, stable Continue to recommend BP goal of <130s/<80s        Endocrine   Hyperlipidemia associated with type 2 diabetes mellitus (HCC)    Goal of LDL <70 recommend diet low in saturated fat and regular exercise - 30 min at least 5 times per week       Relevant Orders   Lipid panel   Acquired hypothyroidism    Denies heat/cold intolerance Denies weight gain/loss Denies mood swings Repeat labs for annual      Relevant Orders   T4, free   TSH   RESOLVED: Diabetes mellitus, type 2 (HCC)   Relevant Orders   Comprehensive metabolic panel   Hemoglobin A1c   Urine Microalbumin w/creat. ratio     Musculoskeletal and Integument   Nail fungus  Referral to podiatry given thickened nails with fungus      Relevant Medications   Zoster Vaccine Adjuvanted [facility]) injection   Other Relevant Orders   Ambulatory referral to Podiatry     Other   Thickened nails    amb ref to pod to assist with filing      Relevant Orders   Ambulatory referral to Podiatry   Medicare annual wellness visit, subsequent - Primary    UTD on dental and vision Things to do to keep yourself healthy  - Exercise at least 30-45 minutes a day, 3-4 days a week.  - Eat a low-fat diet with lots of fruits and vegetables, up to 7-9 servings per day.  - Seatbelts can save your life. Wear them always.  - Smoke detectors on every level of your home, check batteries every year.  - Eye Doctor - have an eye exam every 1-2 years  - Safe sex - if you may be exposed to STDs, use a condom.  - Alcohol -  If you drink, do it moderately, less than 2 drinks per day.  - [place]. Choose someone to speak for you if you are not able.  -  Depression is common in our stressful world.If you're feeling down or losing interest in things you normally enjoy, please come in for a visit.  - Violence - If anyone is threatening or hurting you, please call immediately.  Additional work note provided Will establish care with psychologist later this week      Abnormal mammogram    Denies further concerns; due for 6 month f/u      Need for shingles vaccine    Education provided on importance of prevention in the aging adult given hx of chicken pox/varicella virus Paper prescription provided      Relevant Medications   Zoster Vaccine Adjuvanted [facility]) injection   Other Relevant Orders   Varicella-zoster vaccine IM (Shingrix)   Cough due to bronchospasm    Encouraged OTC medication to keep throat moistened Protect airway from harsh temperatures No signs of infection or drainage        Return in about 6 months (around 08/19/2021) for chonic disease management.     Vonna Kotyk, FNP, have reviewed all documentation for this visit. The documentation on 02/19/21 for the exam, diagnosis, procedures, and orders are all accurate and complete.    [name], [place] [phone] (phone) [phone] (fax)  [place]

## 2021-02-19 NOTE — Assessment & Plan Note (Signed)
amb ref to pod to assist with filing

## 2021-02-19 NOTE — Assessment & Plan Note (Signed)
Goal of LDL <70 recommend diet low in saturated fat and regular exercise - 30 min at least 5 times per week

## 2021-02-19 NOTE — Assessment & Plan Note (Signed)
Denies further concerns; due for 6 month f/u

## 2021-02-19 NOTE — Assessment & Plan Note (Signed)
Chronic, stable Continue to recommend BP goal of <130s/<80s

## 2021-02-19 NOTE — Assessment & Plan Note (Signed)
Denies heat/cold intolerance Denies weight gain/loss Denies mood swings Repeat labs for annual

## 2021-02-19 NOTE — Assessment & Plan Note (Signed)
Encouraged OTC medication to keep throat moistened Protect airway from harsh temperatures No signs of infection or drainage

## 2021-02-19 NOTE — Assessment & Plan Note (Signed)
UTD on dental and vision Things to do to keep yourself healthy  - Exercise at least 30-45 minutes a day, 3-4 days a week.  - Eat a low-fat diet with lots of fruits and vegetables, up to 7-9 servings per day.  - Seatbelts can save your life. Wear them always.  - Smoke detectors on every level of your home, check batteries every year.  - Eye Doctor - have an eye exam every 1-2 years  - Safe sex - if you may be exposed to STDs, use a condom.  - Alcohol -  If you drink, do it moderately, less than 2 drinks per day.  - [place]. Choose someone to speak for you if you are not able.  - Depression is common in our stressful world.If you're feeling down or losing interest in things you normally enjoy, please come in for a visit.  - Violence - If anyone is threatening or hurting you, please call immediately.  Additional work note provided Will establish care with psychologist later this week

## 2021-02-19 NOTE — Assessment & Plan Note (Signed)
Referral to podiatry given thickened nails with fungus

## 2021-02-20 ENCOUNTER — Other Ambulatory Visit: Payer: Self-pay | Admitting: Family Medicine

## 2021-02-20 LAB — COMPREHENSIVE METABOLIC PANEL
ALT: 45 IU/L — ABNORMAL HIGH (ref 0–32)
AST: 68 IU/L — ABNORMAL HIGH (ref 0–40)
Albumin/Globulin Ratio: 1.2 (ref 1.2–2.2)
Albumin: 4.1 g/dL (ref 3.8–4.8)
Alkaline Phosphatase: 147 IU/L — ABNORMAL HIGH (ref 44–121)
BUN/Creatinine Ratio: 15 (ref 12–28)
BUN: 13 mg/dL (ref 8–27)
Bilirubin Total: 0.2 mg/dL (ref 0.0–1.2)
CO2: 27 mmol/L (ref 20–29)
Calcium: 10.1 mg/dL (ref 8.7–10.3)
Chloride: 96 mmol/L (ref 96–106)
Creatinine, Ser: 0.89 mg/dL (ref 0.57–1.00)
Globulin, Total: 3.4 g/dL (ref 1.5–4.5)
Glucose: 130 mg/dL — ABNORMAL HIGH (ref 70–99)
Potassium: 4.9 mmol/L (ref 3.5–5.2)
Sodium: 138 mmol/L (ref 134–144)
Total Protein: 7.5 g/dL (ref 6.0–8.5)
eGFR: 70 mL/min/{1.73_m2} (ref >59)

## 2021-02-20 LAB — LIPID PANEL
Chol/HDL Ratio: 3.3 ratio (ref 0.0–4.4)
Cholesterol, Total: 169 mg/dL (ref 100–199)
HDL: 51 mg/dL (ref >39)
LDL Chol Calc (NIH): 95 mg/dL (ref 0–99)
Triglycerides: 130 mg/dL (ref 0–149)
VLDL Cholesterol Cal: 23 mg/dL (ref 5–40)

## 2021-02-20 LAB — MICROALBUMIN / CREATININE URINE RATIO
Creatinine, Urine: 61.8 mg/dL
Microalb/Creat Ratio: <5 mg/g creat (ref 0–29)
Microalbumin, Urine: <3 ug/mL

## 2021-02-20 LAB — T4, FREE: Free T4: 1.56 ng/dL (ref 0.82–1.77)

## 2021-02-20 LAB — HEMOGLOBIN A1C
Est. average glucose Bld gHb Est-mCnc: 157 mg/dL
Hgb A1c MFr Bld: 7.1 % — ABNORMAL HIGH (ref 4.8–5.6)

## 2021-02-20 LAB — TSH: TSH: 4.27 u[IU]/mL (ref 0.450–4.500)

## 2021-02-20 MED ORDER — ATORVASTATIN CALCIUM 40 MG PO TABS: 40.0000 mg | ORAL_TABLET | Freq: Every day | ORAL | 0 refills | Status: DC

## 2021-02-21 ENCOUNTER — Ambulatory Visit (INDEPENDENT_AMBULATORY_CARE_PROVIDER_SITE_OTHER): Payer: Medicare Other | Admitting: Licensed Clinical Social Worker

## 2021-02-21 ENCOUNTER — Other Ambulatory Visit: Payer: Self-pay

## 2021-02-21 DIAGNOSIS — F410 Panic disorder [episodic paroxysmal anxiety] without agoraphobia: Secondary | ICD-10-CM | POA: Diagnosis not present

## 2021-02-21 DIAGNOSIS — F4010 Social phobia, unspecified: Secondary | ICD-10-CM | POA: Diagnosis not present

## 2021-02-21 DIAGNOSIS — F32A Depression, unspecified: Secondary | ICD-10-CM

## 2021-02-24 ENCOUNTER — Encounter: Payer: Self-pay | Admitting: Family Medicine

## 2021-02-25 NOTE — Plan of Care (Signed)
Developed tx plan with pt input  
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
## 2021-03-04 NOTE — Addendum Note (Signed)
Addended by: Tally Joe T on: 03/04/2021 10:57 AM   Modules accepted: Level of Service

## 2021-03-07 ENCOUNTER — Telehealth: Payer: Self-pay | Admitting: *Deleted

## 2021-03-07 NOTE — Chronic Care Management (AMB) (Signed)
°  Care Management   Note  03/07/2021 Name: Melissa Arroyo MRN: [id number] DOB: 1950-07-27  Melissa Arroyo is a 71 y.o. year old female who is a primary care patient of [name], FNP and is actively engaged with the care management team. I reached out to [name] by phone today to assist with re-scheduling a follow up visit with the RN Case Manager  Follow up plan: Unsuccessful telephone outreach attempt made. A HIPAA compliant phone message was left for the patient providing contact information and requesting a return call.   Julian Hy, [place] Management  Direct Dial: [phone]

## 2021-03-11 ENCOUNTER — Telehealth: Payer: Medicare Other

## 2021-03-15 ENCOUNTER — Encounter: Payer: Self-pay | Admitting: Family Medicine

## 2021-03-15 ENCOUNTER — Ambulatory Visit
Admission: RE | Admit: 2021-03-15 | Discharge: 2021-03-15 | Disposition: A | Discharge status: Home / Self Care | Payer: Medicare Other | Source: Ambulatory Visit | Attending: Family Medicine | Admitting: Family Medicine

## 2021-03-15 ENCOUNTER — Telehealth: Payer: Self-pay | Admitting: *Deleted

## 2021-03-15 ENCOUNTER — Other Ambulatory Visit: Payer: Self-pay

## 2021-03-15 DIAGNOSIS — R928 Other abnormal and inconclusive findings on diagnostic imaging of breast: Secondary | ICD-10-CM

## 2021-03-15 DIAGNOSIS — N63 Unspecified lump in unspecified breast: Secondary | ICD-10-CM

## 2021-03-15 DIAGNOSIS — R922 Inconclusive mammogram: Secondary | ICD-10-CM | POA: Diagnosis not present

## 2021-03-15 DIAGNOSIS — R921 Mammographic calcification found on diagnostic imaging of breast: Secondary | ICD-10-CM | POA: Diagnosis not present

## 2021-03-15 NOTE — Telephone Encounter (Signed)
I faxed over office notes as requested pertaining to patients condition, I will leave the certificate return to work letter in your box for you til fill out. KW

## 2021-03-15 NOTE — Telephone Encounter (Signed)
Copied from [place] [id number]. Topic: General - Other >> Mar 14, 2021  2:17 PM Erick Blinks wrote: Reason for CRM: Message for PCP   The deadline for the notes for her disability was today. They sent the package, they need the doctor's note as to why she will be out until March 17th. Requesting correspondence from PCP   Going Monday for tests with Dr. Manuella Ghazi  Has an appt on 2/25 for testing with Dr. Myrtie Neither  She returns 3/09 for results with Dr. Myrtie Neither

## 2021-03-18 ENCOUNTER — Other Ambulatory Visit: Payer: Self-pay | Admitting: Family Medicine

## 2021-03-18 DIAGNOSIS — G44209 Tension-type headache, unspecified, not intractable: Secondary | ICD-10-CM | POA: Diagnosis not present

## 2021-03-18 DIAGNOSIS — F419 Anxiety disorder, unspecified: Secondary | ICD-10-CM | POA: Diagnosis not present

## 2021-03-18 DIAGNOSIS — R4189 Other symptoms and signs involving cognitive functions and awareness: Secondary | ICD-10-CM | POA: Diagnosis not present

## 2021-03-18 DIAGNOSIS — R928 Other abnormal and inconclusive findings on diagnostic imaging of breast: Secondary | ICD-10-CM

## 2021-03-18 DIAGNOSIS — R921 Mammographic calcification found on diagnostic imaging of breast: Secondary | ICD-10-CM

## 2021-03-18 DIAGNOSIS — F319 Bipolar disorder, unspecified: Secondary | ICD-10-CM | POA: Diagnosis not present

## 2021-03-18 NOTE — Telephone Encounter (Signed)
Patient reports that she is much better. Took OTC medicine.

## 2021-03-25 ENCOUNTER — Ambulatory Visit: Payer: Medicare Other | Admitting: Podiatry

## 2021-03-25 ENCOUNTER — Encounter: Payer: Self-pay | Admitting: Family Medicine

## 2021-03-27 ENCOUNTER — Encounter: Payer: Self-pay | Admitting: Family Medicine

## 2021-03-27 ENCOUNTER — Other Ambulatory Visit: Payer: Self-pay

## 2021-03-27 ENCOUNTER — Ambulatory Visit (INDEPENDENT_AMBULATORY_CARE_PROVIDER_SITE_OTHER): Payer: Medicare Other | Admitting: Family Medicine

## 2021-03-27 VITALS — BP 153/78 | HR 81 | Temp 97.8°F | Wt 207.0 lb

## 2021-03-27 DIAGNOSIS — R413 Other amnesia: Secondary | ICD-10-CM | POA: Diagnosis not present

## 2021-03-27 DIAGNOSIS — E1159 Type 2 diabetes mellitus with other circulatory complications: Secondary | ICD-10-CM | POA: Diagnosis not present

## 2021-03-27 DIAGNOSIS — F410 Panic disorder [episodic paroxysmal anxiety] without agoraphobia: Secondary | ICD-10-CM | POA: Diagnosis not present

## 2021-03-27 DIAGNOSIS — I152 Hypertension secondary to endocrine disorders: Secondary | ICD-10-CM | POA: Diagnosis not present

## 2021-03-27 DIAGNOSIS — R928 Other abnormal and inconclusive findings on diagnostic imaging of breast: Secondary | ICD-10-CM

## 2021-03-27 DIAGNOSIS — F4310 Post-traumatic stress disorder, unspecified: Secondary | ICD-10-CM | POA: Diagnosis not present

## 2021-03-27 NOTE — Assessment & Plan Note (Signed)
Ongoing concerns; impairing ability to work

## 2021-03-27 NOTE — Assessment & Plan Note (Signed)
Elevated today; likely d/t stress and recent viral illness Continued to follow recommendations <130/<90

## 2021-03-27 NOTE — Progress Notes (Addendum)
Established patient visit   Patient: Melissa Arroyo   DOB: 1950/03/24   71 y.o. Female  MRN: [id number] Visit Date: 03/27/2021  Today's healthcare provider: [name], FNP   No chief complaint on file.  Subjective    HPI  Patient presents to review paperwork for her returning to work.  Patient does not feel she can go back to work at this point.  Has appointment with psychologist later this month and feels she needs to see him before going back to work.  She also has appointment scheduled for breast biopsy. Patient also complains of having nightmares.  Medications: Outpatient Medications Prior to Visit  Medication Sig   ACCU-CHEK GUIDE test strip USE TO TEST ONCE DAILY   Alcohol Swabs (ALCOHOL PREP) PADS Use 1 pad prior to checking blood sugar to clean skin   atorvastatin (LIPITOR) 40 MG tablet Take 1 tablet (40 mg total) by mouth daily.   b complex vitamins capsule Take 1 capsule by mouth daily.   blood glucose meter kit and supplies Dispense based on patient and insurance preference. Check glucose once daily. (FOR ICD-10 E11.29).   buPROPion (WELLBUTRIN XL) 300 MG 24 hr tablet Take 300 mg by mouth every morning.   chlorhexidine (PERIDEX) 0.12 % solution SMARTSIG:By Mouth   Cholecalciferol (VITAMIN D3) 10 MCG (400 UNIT) tablet Take by mouth daily. Dose unknown   dicyclomine (BENTYL) 10 MG capsule Take 1 capsule (10 mg total) by mouth 4 (four) times daily -  before meals and at bedtime.   estradiol (ESTRACE) 0.1 MG/GM vaginal cream Place 1 Applicatorful vaginally at bedtime.   FLUoxetine (PROZAC) 20 MG capsule Take 3 capsules (60 mg total) by mouth daily.   FLUoxetine (PROZAC) 40 MG capsule Take 40 mg by mouth daily.   fluticasone (FLONASE SENSIMIST) 27.5 MCG/SPRAY nasal spray Place 1-2 sprays into the nose daily.   glipiZIDE (GLUCOTROL XL) 10 MG 24 hr tablet TAKE 1 TABLET(10 MG) BY MOUTH DAILY WITH BREAKFAST   Glucosamine-Chondroitin (MOVE FREE PO) Take 1 tablet by mouth  daily.   ibuprofen (ADVIL,MOTRIN) 200 MG tablet Take 400 mg by mouth at bedtime. As needed   metFORMIN (GLUCOPHAGE) 1000 MG tablet TAKE 1 TABLET(1000 MG) BY MOUTH TWICE DAILY WITH A MEAL   Multiple Vitamins-Minerals (CENTRUM SILVER PO) Take 1 tablet by mouth daily.   OLANZapine (ZYPREXA) 5 MG tablet Take 7.5 mg by mouth at bedtime.   pioglitazone (ACTOS) 30 MG tablet TAKE 1 TABLET(30 MG) BY MOUTH DAILY   propranolol (INDERAL) 10 MG tablet TAKE 1 TABLET(10 MG) BY MOUTH TWICE DAILY   zinc gluconate 50 MG tablet Take by mouth daily. Dose unknown   No facility-administered medications prior to visit.    Review of Systems  Constitutional:  Positive for fatigue. Negative for chills, diaphoresis and fever.  HENT:  Positive for congestion, postnasal drip, rhinorrhea, sinus pressure and sinus pain. Negative for ear discharge, ear pain, nosebleeds, sneezing and sore throat.   Respiratory:  Positive for cough and wheezing. Negative for shortness of breath.   Cardiovascular:  Negative for chest pain, palpitations and leg swelling.  Gastrointestinal:  Negative for abdominal pain, diarrhea, nausea and vomiting.  Musculoskeletal:  Negative for arthralgias and myalgias.  Neurological:  Negative for dizziness, light-headedness and headaches.      Objective    BP (!) 153/78 (BP Location: Right Arm, Patient Position: Sitting, Cuff Size: Large)    Pulse 81    Temp 97.8 F (36.6 C) (  Oral)    Wt 207 lb (93.9 kg)    SpO2 99%    BMI 34.45 kg/m    Physical Exam Vitals and nursing note reviewed.  Constitutional:      General: She is not in acute distress.    Appearance: Normal appearance. She is obese. She is not ill-appearing, toxic-appearing or diaphoretic.  HENT:     Head: Normocephalic and atraumatic.  Cardiovascular:     Rate and Rhythm: Normal rate and regular rhythm.     Pulses: Normal pulses.     Heart sounds: Normal heart sounds. No murmur heard.   No friction rub. No gallop.  Pulmonary:      Effort: Pulmonary effort is normal. No respiratory distress.     Breath sounds: Normal breath sounds. No stridor. No wheezing, rhonchi or rales.  Chest:     Chest wall: No tenderness.  Abdominal:     General: Bowel sounds are normal.     Palpations: Abdomen is soft.  Musculoskeletal:        General: No swelling, tenderness, deformity or signs of injury. Normal range of motion.     Right lower leg: No edema.     Left lower leg: No edema.  Skin:    General: Skin is warm and dry.     Capillary Refill: Capillary refill takes less than 2 seconds.     Coloration: Skin is not jaundiced or pale.     Findings: No bruising, erythema, lesion or rash.  Neurological:     General: No focal deficit present.     Mental Status: She is alert and oriented to person, place, and time. Mental status is at baseline.     Cranial Nerves: No cranial nerve deficit.     Sensory: No sensory deficit.     Motor: No weakness.     Coordination: Coordination normal.  Psychiatric:        Mood and Affect: Mood normal.        Behavior: Behavior normal.        Thought Content: Thought content normal.        Judgment: Judgment normal.     No results found for any visits on 03/27/21.  Assessment & Plan     Problem List Items Addressed This Visit       Cardiovascular and Mediastinum   Hypertension associated with diabetes ([place])    Elevated today; likely d/t stress and recent viral illness Continued to follow recommendations <130/<90        Other   Abnormal mammogram    Has appt coming up for bx; reassurance provided going forward      Memory deficits - Primary    Ongoing concerns; impairing ability to work      Panic attack due to post traumatic stress disorder (PTSD)    Seen today for additional f/u for paperwork given denial for short term disability Pt has petitioned for additional time- 2/14 Request for letter from PCP going forward summarizing other provider appointments and results Pt  contracted to safety; denies SI or HI      I,Elena D DeSanto,acting as a scribe for [name], FNP.,have documented all relevant documentation on the behalf of [name], FNP,as directed by  [name], FNP while in the presence of [name], FNP.  Return in about 4 weeks (around 04/24/2021) for chonic disease management.      Vonna Kotyk, FNP, have reviewed all documentation for this visit.  The documentation on 03/27/21 for the exam, diagnosis, procedures, and orders are all accurate and complete.    [name], [place] [phone] (phone) [phone] (fax)  [place]

## 2021-03-27 NOTE — Assessment & Plan Note (Signed)
Has appt coming up for bx; reassurance provided going forward

## 2021-03-27 NOTE — Assessment & Plan Note (Signed)
Seen today for additional f/u for paperwork given denial for short term disability Pt has petitioned for additional time- 2/14 Request for letter from PCP going forward summarizing other provider appointments and results Pt contracted to safety; denies SI or HI

## 2021-03-28 DIAGNOSIS — Z20822 Contact with and (suspected) exposure to covid-19: Secondary | ICD-10-CM | POA: Diagnosis not present

## 2021-03-29 ENCOUNTER — Other Ambulatory Visit: Payer: Self-pay

## 2021-03-29 ENCOUNTER — Ambulatory Visit
Admission: RE | Admit: 2021-03-29 | Discharge: 2021-03-29 | Disposition: A | Discharge status: Home / Self Care | Payer: Medicare Other | Source: Ambulatory Visit | Attending: Family Medicine | Admitting: Family Medicine

## 2021-03-29 DIAGNOSIS — N6081 Other benign mammary dysplasias of right breast: Secondary | ICD-10-CM | POA: Diagnosis not present

## 2021-03-29 DIAGNOSIS — R928 Other abnormal and inconclusive findings on diagnostic imaging of breast: Secondary | ICD-10-CM

## 2021-03-29 DIAGNOSIS — R921 Mammographic calcification found on diagnostic imaging of breast: Secondary | ICD-10-CM | POA: Insufficient documentation

## 2021-03-29 HISTORY — PX: BREAST BIOPSY: SHX20

## 2021-04-01 LAB — SURGICAL PATHOLOGY

## 2021-04-02 NOTE — Chronic Care Management (AMB) (Signed)
°  Care Management   Note  04/02/2021 Name: Melissa Arroyo MRN: [id number] DOB: 1950/02/28  Melissa Arroyo is a 71 y.o. year old female who is a primary care patient of [name], FNP and is actively engaged with the care management team. I reached out to [name] by phone today to assist with re-scheduling a follow up visit with the RN Case Manager  Follow up plan: Telephone appointment with care management team member scheduled for: 04/08/2021  Julian Hy, [place], [place] Management  Direct Dial: [phone]

## 2021-04-03 ENCOUNTER — Other Ambulatory Visit: Payer: Self-pay

## 2021-04-03 ENCOUNTER — Other Ambulatory Visit: Payer: Self-pay | Admitting: Family Medicine

## 2021-04-03 ENCOUNTER — Telehealth: Payer: Self-pay

## 2021-04-03 ENCOUNTER — Ambulatory Visit (INDEPENDENT_AMBULATORY_CARE_PROVIDER_SITE_OTHER): Payer: Medicare Other | Admitting: Licensed Clinical Social Worker

## 2021-04-03 DIAGNOSIS — F4010 Social phobia, unspecified: Secondary | ICD-10-CM

## 2021-04-03 DIAGNOSIS — F32A Depression, unspecified: Secondary | ICD-10-CM

## 2021-04-03 NOTE — Progress Notes (Addendum)
THERAPIST PROGRESS NOTE  Session Time: 10-11a  Participation Level: Active  Behavioral Response: NeatAlertAnxious and Depressed  Type of Therapy: Individual Therapy  Treatment Goals addressed:  Problem: Depression CCP Problem  1 Decrease depressive symptoms and improve levels of effective functioning  Goal: LTG: Reduce frequency, intensity, and duration of depression symptoms as evidenced by: SSB input needed on appropriate metric--pt self report Outcome: Not Progressing Note: Pt continuing to express low motivation, low energy, low initiative to start projects/tasks Goal: STG: Manuela Schwartz WILL PARTICIPATE IN AT LEAST 80% OF SCHEDULED INDIVIDUAL PSYCHOTHERAPY SESSIONS Outcome: Progressing Problem: Anxiety Disorder CCP Problem  1 Reduce overall frequency, intensity, and duration of the anxiety so that daily functioning is not impaired.  Goal: LTG: Patient will score less than 5 on the Generalized Anxiety Disorder 7 Scale (GAD-7) Outcome: Not Progressing Note: Pt continuing to express anxiety/fear/ruminations about previous job and supervisor Goal: STG: Patient will participate in at least 80% of scheduled individual psychotherapy sessions Outcome: Progressing  ProgressTowards Goals: Not Progressing  Interventions: CBT, Motivational Interviewing, and Reframing  Summary: Melissa Arroyo is a 71 y.o. female who presents with improving symptoms related to depression. Pt reports that overall mood has been stable recently.  Pt reports that she had some anxiety/stress about her disability not getting approved and she thought she was going to have to go back to work. Pt is relieved that it got approved through September.   Allowed pt to explore and express thoughts and feelings associated with recent life situations and external stressors. Allowed pt to explore feelings associated with her recent job and how pt has felt wronged, discriminated against, and overall hurt by fellow employees and her  supervisor. Pt reports that she has a lot of clutter in her home and she doesn't like it and her husband doesn't like it.  Pt states shes told her husband that he can go back and move into his own house. Pt also states she is fearful that her husband will leave her one day. Discussed ways/methods of tackling the jobs of cleaning by going room to room. Pt states she enjoys cooking, but gets upset because she has roaches in her home. Explored relationships with sister--had lunch together the other day. Pt reports her sister is concerned with the state of pts home.  Reframed several scenarios and allowed pt to identify behaviors that could be modified. Developed plan for modifying behaviors (cleaning, recreational, self care). Encouraged pt to use mindfulness to help bring back to the present when she starts to ruminate about relationships/colleagues from job.   Continued recommendations are as follows: self care behaviors, positive social engagements, focusing on overall work/home/life balance, and focusing on positive physical and emotional wellness.  .   Suicidal/Homicidal: No  Therapist Response: Pt is continuing to apply interventions learned in session into daily life situations. Pt is currently on track to meet goals utilizing interventions mentioned above. Personal growth and progress noted. Treatment to continue as indicated.   Plan: Return again in 4 weeks.  Diagnosis: Depression, unspecified depression type  Social anxiety disorder  Collaboration of Care: Other Pt encouraged to continue psychiatric care with Dr. Shea Evans  Patient/Guardian was advised Release of Information must be obtained prior to any record release in order to collaborate their care with an outside provider. Patient/Guardian was advised if they have not already done so to contact the registration department to sign all necessary forms in order for [place] to release information regarding their care.   Consent:  Patient/Guardian  gives verbal consent for treatment and assignment of benefits for services provided during this visit. Patient/Guardian expressed understanding and agreed to proceed.   [place], LCSW 04/03/2021

## 2021-04-03 NOTE — Telephone Encounter (Signed)
Spoke to [place] at [facility] [employer] regarding claim for patient. She reports that patient is approved for leave until 10/25/2021. And is approved for short term disability until 04/04/2021. Patient was seen on 03/27/21. I will go ahead and fax this office note to be reviewed for additional approval of short term disability. Dallie Piles did inform that we need to continue sending office notes on patient to be reviewed for additional short term disability. Fax # [phone] Claim # [id number].

## 2021-04-04 NOTE — Plan of Care (Signed)
°  Problem: Depression CCP Problem  1 Decrease depressive symptoms and improve levels of effective functioning  Goal: LTG: Reduce frequency, intensity, and duration of depression symptoms as evidenced by: SSB input needed on appropriate metric--pt self report Outcome: Not Progressing Note: Pt continuing to express low motivation, low energy, low initiative to start projects/tasks Goal: STG: Manuela Schwartz WILL PARTICIPATE IN AT LEAST 80% OF SCHEDULED INDIVIDUAL PSYCHOTHERAPY SESSIONS Outcome: Progressing   Problem: Anxiety Disorder CCP Problem  1 Reduce overall frequency, intensity, and duration of the anxiety so that daily functioning is not impaired.  Goal: LTG: Patient will score less than 5 on the Generalized Anxiety Disorder 7 Scale (GAD-7) Outcome: Not Progressing Note: Pt continuing to express anxiety/fear/ruminations about previous job and supervisor Goal: STG: Patient will participate in at least 80% of scheduled individual psychotherapy sessions Outcome: Progressing

## 2021-04-04 NOTE — Plan of Care (Signed)
°  Problem: Depression CCP Problem  1 Decrease depressive symptoms and improve levels of effective functioning  Goal: LTG: Reduce frequency, intensity, and duration of depression symptoms as evidenced by: SSB input needed on appropriate metric--pt self report Outcome: Not Progressing Note: Pt continuing to express low motivation, low energy, low initiative to start projects/tasks Goal: STG: Manuela Schwartz WILL PARTICIPATE IN AT LEAST 80% OF SCHEDULED INDIVIDUAL PSYCHOTHERAPY SESSIONS Outcome: Progressing Intervention: Continue cognitive behavioral therapy for depression management Intervention: Monitor coping skills and behavior Intervention: Perform motivational interviewing regarding personal goals   Problem: Anxiety Disorder CCP Problem  1 Reduce overall frequency, intensity, and duration of the anxiety so that daily functioning is not impaired.  Goal: LTG: Patient will score less than 5 on the Generalized Anxiety Disorder 7 Scale (GAD-7) Outcome: Not Progressing Note: Pt continuing to express anxiety/fear/ruminations about previous job and supervisor Goal: STG: Patient will participate in at least 80% of scheduled individual psychotherapy sessions Outcome: Progressing Intervention: Encourage verbalization of feelings/concerns/expectations Intervention: Encourage patient to identify triggers

## 2021-04-05 DIAGNOSIS — F900 Attention-deficit hyperactivity disorder, predominantly inattentive type: Secondary | ICD-10-CM | POA: Diagnosis not present

## 2021-04-05 DIAGNOSIS — G3184 Mild cognitive impairment, so stated: Secondary | ICD-10-CM | POA: Diagnosis not present

## 2021-04-08 ENCOUNTER — Ambulatory Visit (INDEPENDENT_AMBULATORY_CARE_PROVIDER_SITE_OTHER): Payer: Medicare Other

## 2021-04-08 DIAGNOSIS — I152 Hypertension secondary to endocrine disorders: Secondary | ICD-10-CM

## 2021-04-08 DIAGNOSIS — E1159 Type 2 diabetes mellitus with other circulatory complications: Secondary | ICD-10-CM

## 2021-04-08 DIAGNOSIS — Z20822 Contact with and (suspected) exposure to covid-19: Secondary | ICD-10-CM | POA: Diagnosis not present

## 2021-04-08 DIAGNOSIS — F411 Generalized anxiety disorder: Secondary | ICD-10-CM

## 2021-04-08 DIAGNOSIS — E1169 Type 2 diabetes mellitus with other specified complication: Secondary | ICD-10-CM

## 2021-04-08 DIAGNOSIS — E1129 Type 2 diabetes mellitus with other diabetic kidney complication: Secondary | ICD-10-CM

## 2021-04-08 NOTE — Chronic Care Management (AMB) (Incomplete)
Chronic Care Management   CCM RN Visit Note  04/08/2021 Name: Melissa Arroyo MRN: [id number] DOB: 26-May-1950  Subjective: Melissa Arroyo is a 71 y.o. year old female who is a primary care patient of [name], FNP. The care management team was consulted for assistance with disease management and care coordination needs.    {CCMTELEPHONEFACETOFACE:21091510} for {CCMINITIALFOLLOWUPCHOICE:21091511} in response to provider referral for case management and/or care coordination services.   Consent to Services:  {CCMCONSENTOPTIONS:25074}  Patient agreed to services and verbal consent obtained.   Assessment: Review of patient past medical history, allergies, medications, health status, including review of consultants reports, laboratory and other test data, was performed as part of comprehensive evaluation and provision of chronic care management services.   SDOH (Social Determinants of Health) assessments and interventions performed:    CCM Care Plan  Allergies  Allergen Reactions   Vesicare [Solifenacin] Anaphylaxis    Throat Closed   Penicillins Rash    Outpatient Encounter Medications as of 04/08/2021  Medication Sig   ACCU-CHEK GUIDE test strip USE TO TEST ONCE DAILY   Alcohol Swabs (ALCOHOL PREP) PADS Use 1 pad prior to checking blood sugar to clean skin   atorvastatin (LIPITOR) 40 MG tablet Take 1 tablet (40 mg total) by mouth daily.   b complex vitamins capsule Take 1 capsule by mouth daily.   blood glucose meter kit and supplies Dispense based on patient and insurance preference. Check glucose once daily. (FOR ICD-10 E11.29).   buPROPion (WELLBUTRIN XL) 300 MG 24 hr tablet Take 300 mg by mouth every morning.   chlorhexidine (PERIDEX) 0.12 % solution SMARTSIG:By Mouth   Cholecalciferol (VITAMIN D3) 10 MCG (400 UNIT) tablet Take by mouth daily. Dose unknown   dicyclomine (BENTYL) 10 MG capsule Take 1 capsule (10 mg total) by mouth 4 (four) times daily -  before meals and at  bedtime.   estradiol (ESTRACE) 0.1 MG/GM vaginal cream Place 1 Applicatorful vaginally at bedtime.   FLUoxetine (PROZAC) 20 MG capsule Take 3 capsules (60 mg total) by mouth daily.   FLUoxetine (PROZAC) 40 MG capsule Take 40 mg by mouth daily.   fluticasone (FLONASE SENSIMIST) 27.5 MCG/SPRAY nasal spray Place 1-2 sprays into the nose daily.   glipiZIDE (GLUCOTROL XL) 10 MG 24 hr tablet TAKE 1 TABLET(10 MG) BY MOUTH DAILY WITH BREAKFAST   Glucosamine-Chondroitin (MOVE FREE PO) Take 1 tablet by mouth daily.   ibuprofen (ADVIL,MOTRIN) 200 MG tablet Take 400 mg by mouth at bedtime. As needed   metFORMIN (GLUCOPHAGE) 1000 MG tablet TAKE 1 TABLET(1000 MG) BY MOUTH TWICE DAILY WITH A MEAL   Multiple Vitamins-Minerals (CENTRUM SILVER PO) Take 1 tablet by mouth daily.   OLANZapine (ZYPREXA) 5 MG tablet Take 7.5 mg by mouth at bedtime.   pioglitazone (ACTOS) 30 MG tablet TAKE 1 TABLET(30 MG) BY MOUTH DAILY   propranolol (INDERAL) 10 MG tablet TAKE 1 TABLET(10 MG) BY MOUTH TWICE DAILY   zinc gluconate 50 MG tablet Take by mouth daily. Dose unknown   No facility-administered encounter medications on file as of 04/08/2021.    Patient Active Problem List   Diagnosis Date Noted   Memory deficits 03/27/2021   Panic attack due to post traumatic stress disorder (PTSD) 03/27/2021   Thickened nails 02/19/2021   Nail fungus 02/19/2021   Medicare annual wellness visit, subsequent 02/19/2021   Abnormal mammogram 02/19/2021   Acquired hypothyroidism 02/19/2021   Need for shingles vaccine 02/19/2021   Cough due to bronchospasm 02/19/2021  Hypertension associated with diabetes ([place]) 02/19/2021   Hyperlipidemia associated with type 2 diabetes mellitus ([place]) 07/24/2014    Conditions to be addressed/monitored:{CCM ASSESSMENT DZ OPTIONS:25047}  There are no care plans that you recently modified to display for this patient.   Plan:{CM FOLLOW UP PLAN:25073} SIG***

## 2021-04-09 DIAGNOSIS — E1159 Type 2 diabetes mellitus with other circulatory complications: Secondary | ICD-10-CM | POA: Diagnosis not present

## 2021-04-09 DIAGNOSIS — R809 Proteinuria, unspecified: Secondary | ICD-10-CM | POA: Diagnosis not present

## 2021-04-09 DIAGNOSIS — E1129 Type 2 diabetes mellitus with other diabetic kidney complication: Secondary | ICD-10-CM

## 2021-04-09 DIAGNOSIS — E1169 Type 2 diabetes mellitus with other specified complication: Secondary | ICD-10-CM

## 2021-04-09 DIAGNOSIS — E785 Hyperlipidemia, unspecified: Secondary | ICD-10-CM | POA: Diagnosis not present

## 2021-04-09 DIAGNOSIS — I152 Hypertension secondary to endocrine disorders: Secondary | ICD-10-CM

## 2021-04-23 DIAGNOSIS — G3184 Mild cognitive impairment, so stated: Secondary | ICD-10-CM | POA: Diagnosis not present

## 2021-04-23 DIAGNOSIS — F3176 Bipolar disorder, in full remission, most recent episode depressed: Secondary | ICD-10-CM | POA: Diagnosis not present

## 2021-04-23 DIAGNOSIS — F4011 Social phobia, generalized: Secondary | ICD-10-CM | POA: Diagnosis not present

## 2021-04-23 DIAGNOSIS — F900 Attention-deficit hyperactivity disorder, predominantly inattentive type: Secondary | ICD-10-CM | POA: Diagnosis not present

## 2021-04-23 DIAGNOSIS — F410 Panic disorder [episodic paroxysmal anxiety] without agoraphobia: Secondary | ICD-10-CM | POA: Diagnosis not present

## 2021-04-23 DIAGNOSIS — F3341 Major depressive disorder, recurrent, in partial remission: Secondary | ICD-10-CM | POA: Diagnosis not present

## 2021-04-24 ENCOUNTER — Encounter: Payer: Self-pay | Admitting: Family Medicine

## 2021-04-24 ENCOUNTER — Other Ambulatory Visit: Payer: Self-pay

## 2021-04-24 ENCOUNTER — Ambulatory Visit (INDEPENDENT_AMBULATORY_CARE_PROVIDER_SITE_OTHER): Payer: Medicare Other | Admitting: Family Medicine

## 2021-04-24 VITALS — BP 140/82 | HR 86 | Temp 97.8°F | Resp 14 | Wt 209.0 lb

## 2021-04-24 DIAGNOSIS — F4310 Post-traumatic stress disorder, unspecified: Secondary | ICD-10-CM

## 2021-04-24 DIAGNOSIS — F410 Panic disorder [episodic paroxysmal anxiety] without agoraphobia: Secondary | ICD-10-CM | POA: Diagnosis not present

## 2021-04-24 DIAGNOSIS — E1159 Type 2 diabetes mellitus with other circulatory complications: Secondary | ICD-10-CM | POA: Diagnosis not present

## 2021-04-24 DIAGNOSIS — I152 Hypertension secondary to endocrine disorders: Secondary | ICD-10-CM | POA: Diagnosis not present

## 2021-04-24 DIAGNOSIS — R4189 Other symptoms and signs involving cognitive functions and awareness: Secondary | ICD-10-CM | POA: Diagnosis not present

## 2021-04-24 MED ORDER — LISINOPRIL-HYDROCHLOROTHIAZIDE 20-12.5 MG PO TABS: 1.0000 | ORAL_TABLET | Freq: Every day | ORAL | 1 refills | Status: DC

## 2021-04-24 NOTE — Assessment & Plan Note (Signed)
Chronic, improving ?Continues to seek consult from psychiatry and CCM ?Wants to start lifestyle program to assist further as an alternative means of stress reduction ?Shared silver sneakers program at [facility]- patient message sent with URL for information on joining. ?

## 2021-04-24 NOTE — Assessment & Plan Note (Addendum)
Chronic, elevated reading today; encourage use of home logs to assist with BP control ?Reinforce DM BP goal of <130/<80 ?Denies CP ?Denies SOB/ DOE ?Denies low blood pressure/hypotension ?Denies vision changes ?No LE Edema noted on exam ?Start additional medication, Lisinopril 20 with HCTZ 12.5 ?BP is not at propanolol alone- 10 mg BID ?Seek emergent care if you develop chest pain or chest pressure ? ?

## 2021-04-24 NOTE — Progress Notes (Signed)
?  ?I,[name],acting as a scribe for Melissa Sprout, FNP.,have documented all relevant documentation on the behalf of Melissa Sprout, FNP,as directed by  Melissa Sprout, FNP while in the presence of Melissa Sprout, FNP.  ? ?Established patient visit ? ? ?Patient: Melissa Arroyo   DOB: [birth year]   71 y.o. Female  MRN: [id number] ?Visit Date: 04/24/2021 ? ?Today's healthcare provider: [name], FNP  ? ?Re-Introduced to nurse practitioner role and practice setting.  All questions answered.  Discussed provider/patient relationship and expectations. ? ? ?Subjective  ?  ?HPI  ?FLMA paperwork  ?Follow up for panic attacks ? ?The patient was last seen for this 1 months ago. ?Changes made at last visit include no changes. ? ?She reports good compliance with treatment. ?She feels that condition is Unchanged. ?She is not having side effects.  ? ?-----------------------------------------------------------------------------------------  ?Medications: ?Outpatient Medications Prior to Visit  ?Medication Sig  ? ACCU-CHEK GUIDE test strip USE TO TEST ONCE DAILY  ? Alcohol Swabs (ALCOHOL PREP) PADS Use 1 pad prior to checking blood sugar to clean skin  ? atorvastatin (LIPITOR) 40 MG tablet Take 1 tablet (40 mg total) by mouth daily.  ? b complex vitamins capsule Take 1 capsule by mouth daily.  ? blood glucose meter kit and supplies Dispense based on patient and insurance preference. Check glucose once daily. (FOR ICD-10 E11.29).  ? buPROPion (WELLBUTRIN XL) 300 MG 24 hr tablet Take 300 mg by mouth every morning.  ? chlorhexidine (PERIDEX) 0.12 % solution SMARTSIG:By Mouth  ? Cholecalciferol (VITAMIN D3) 10 MCG (400 UNIT) tablet Take by mouth daily. Dose unknown  ? dicyclomine (BENTYL) 10 MG capsule Take 1 capsule (10 mg total) by mouth 4 (four) times daily -  before meals and at bedtime.  ? estradiol (ESTRACE) 0.1 MG/GM vaginal cream Place 1 Applicatorful vaginally at bedtime.  ? FLUoxetine (PROZAC) 20 MG capsule Take 3  capsules (60 mg total) by mouth daily.  ? FLUoxetine (PROZAC) 40 MG capsule Take 40 mg by mouth daily.  ? fluticasone (FLONASE SENSIMIST) 27.5 MCG/SPRAY nasal spray Place 1-2 sprays into the nose daily.  ? glipiZIDE (GLUCOTROL XL) 10 MG 24 hr tablet TAKE 1 TABLET(10 MG) BY MOUTH DAILY WITH BREAKFAST  ? Glucosamine-Chondroitin (MOVE FREE PO) Take 1 tablet by mouth daily.  ? ibuprofen (ADVIL,MOTRIN) 200 MG tablet Take 400 mg by mouth at bedtime. As needed  ? metFORMIN (GLUCOPHAGE) 1000 MG tablet TAKE 1 TABLET(1000 MG) BY MOUTH TWICE DAILY WITH A MEAL  ? Multiple Vitamins-Minerals (CENTRUM SILVER PO) Take 1 tablet by mouth daily.  ? OLANZapine (ZYPREXA) 5 MG tablet Take 7.5 mg by mouth at bedtime.  ? pioglitazone (ACTOS) 30 MG tablet TAKE 1 TABLET(30 MG) BY MOUTH DAILY  ? propranolol (INDERAL) 10 MG tablet TAKE 1 TABLET(10 MG) BY MOUTH TWICE DAILY  ? zinc gluconate 50 MG tablet Take by mouth daily. Dose unknown  ? ?No facility-administered medications prior to visit.  ? ? ?Review of Systems ? ? ?  Objective  ?  ?BP 140/82 (BP Location: Right Arm, Patient Position: Sitting, Cuff Size: Large)   Pulse 86   Temp 97.8 ?F (36.6 ?C) (Oral)   Resp 14   Wt 209 lb (94.8 kg)   SpO2 98%   BMI 34.78 kg/m?  ? ? ?Physical Exam ?Vitals and nursing note reviewed.  ?Constitutional:   ?   General: She is not in acute distress. ?   Appearance: Normal appearance. She is  obese. She is not ill-appearing, toxic-appearing or diaphoretic.  ?HENT:  ?   Head: Normocephalic and atraumatic.  ?Cardiovascular:  ?   Rate and Rhythm: Normal rate and regular rhythm.  ?   Pulses: Normal pulses.  ?   Heart sounds: Normal heart sounds. No murmur heard. ?  No friction rub. No gallop.  ?Pulmonary:  ?   Effort: Pulmonary effort is normal. No respiratory distress.  ?   Breath sounds: Normal breath sounds. No stridor. No wheezing, rhonchi or rales.  ?Chest:  ?   Chest wall: No tenderness.  ?Abdominal:  ?   General: Bowel sounds are normal.  ?    Palpations: Abdomen is soft.  ?Musculoskeletal:     ?   General: No swelling, tenderness, deformity or signs of injury. Normal range of motion.  ?   Right lower leg: No edema.  ?   Left lower leg: No edema.  ?Skin: ?   General: Skin is warm and dry.  ?   Capillary Refill: Capillary refill takes less than 2 seconds.  ?   Coloration: Skin is not jaundiced or pale.  ?   Findings: No bruising, erythema, lesion or rash.  ?Neurological:  ?   General: No focal deficit present.  ?   Mental Status: She is alert and oriented to person, place, and time. Mental status is at baseline.  ?   Cranial Nerves: No cranial nerve deficit.  ?   Sensory: No sensory deficit.  ?   Motor: No weakness.  ?   Coordination: Coordination normal.  ?Psychiatric:     ?   Mood and Affect: Mood normal.     ?   Behavior: Behavior normal.     ?   Thought Content: Thought content normal.     ?   Judgment: Judgment normal.  ?  ? ?No results found for any visits on 04/24/21. ? Assessment & Plan  ?  ? ?Problem List Items Addressed This Visit   ? ?  ? Cardiovascular and Mediastinum  ? Hypertension associated with diabetes ([place])  ?  Chronic, elevated reading today; encourage use of home logs to assist with BP control ?Reinforce DM BP goal of <130/<80 ?Denies CP ?Denies SOB/ DOE ?Denies low blood pressure/hypotension ?Denies vision changes ?No LE Edema noted on exam ?Start additional medication, Lisinopril 20 with HCTZ 12.5 ?BP is not at propanolol alone- 10 mg BID ?Seek emergent care if you develop chest pain or chest pressure ? ?  ?  ? Relevant Medications  ? lisinopril-hydrochlorothiazide (ZESTORETIC) 20-12.5 MG tablet  ?  ? Other  ? Cognitive impairment - Primary  ?  Pt has received dx from [facility] diagnostic center with recommendations not to go back to work. ?Is out on long term disability through 10/2021 ?-patient is happy to have a diagnosis ?-improved mental health since health journey started ?-stable relationship with spouse ?-working on overall  wellbeing ?  ?  ? Panic attack due to post traumatic stress disorder (PTSD)  ?  Chronic, improving ?Continues to seek consult from psychiatry and CCM ?Wants to start lifestyle program to assist further as an alternative means of stress reduction ?Shared silver sneakers program at [facility]- patient message sent with URL for information on joining. ?  ?  ? ? ? ?Return in about 2 months (around 06/24/2021) for HTN management.  ?   ? ?I, Melissa Sprout, FNP, have reviewed all documentation for this visit. The documentation on 04/24/21 for the exam, diagnosis,  procedures, and orders are all accurate and complete. ? ?Melissa Sprout, FNP  ?[place] ?[phone] (phone) ?[phone] (fax) ? ?[facility] ?

## 2021-04-24 NOTE — Assessment & Plan Note (Signed)
Pt has received dx from [facility] diagnostic center with recommendations not to go back to work. ?Is out on long term disability through 10/2021 ?-patient is happy to have a diagnosis ?-improved mental health since health journey started ?-stable relationship with spouse ?-working on overall wellbeing ?

## 2021-04-30 ENCOUNTER — Ambulatory Visit (INDEPENDENT_AMBULATORY_CARE_PROVIDER_SITE_OTHER): Payer: Medicare Other | Admitting: Podiatry

## 2021-04-30 ENCOUNTER — Other Ambulatory Visit: Payer: Self-pay

## 2021-04-30 DIAGNOSIS — M79674 Pain in right toe(s): Secondary | ICD-10-CM

## 2021-04-30 DIAGNOSIS — E119 Type 2 diabetes mellitus without complications: Secondary | ICD-10-CM | POA: Diagnosis not present

## 2021-04-30 DIAGNOSIS — B351 Tinea unguium: Secondary | ICD-10-CM | POA: Diagnosis not present

## 2021-04-30 DIAGNOSIS — M79675 Pain in left toe(s): Secondary | ICD-10-CM

## 2021-05-01 NOTE — Progress Notes (Signed)
? ?  SUBJECTIVE ?Patient presents to office today complaining of elongated, thickened nails that cause pain while ambulating in shoes.  She is unable to trim her own nails.  Patient also presents for annual diabetic foot exam.  Patient is here for further evaluation and treatment. ? ?Past Medical History:  ?Diagnosis Date  ? Anxiety   ? Depression   ? Diabetes mellitus ([place])   ? Dyspnea   ? Heart attack ([place]) 04/11/2007  ? Heart attack ([place])   ? Hyperlipidemia   ? Hypertension   ? Obesity   ? Recurrent UTI   ? Renal lesion   ? Urinary incontinence in female   ? Vaginal atrophy   ? ? ?OBJECTIVE ?General Patient is awake, alert, and oriented x 3 and in no acute distress. ?Derm Skin is dry and supple bilateral. Negative open lesions or macerations. Remaining integument unremarkable. Nails are tender, long, thickened and dystrophic with subungual debris, consistent with onychomycosis, 1-5 bilateral. No signs of infection noted. ?Vasc  DP and PT pedal pulses palpable bilaterally. Temperature gradient within normal limits.  ?Neuro Epicritic and protective threshold sensation grossly intact bilaterally.  ?Musculoskeletal Exam No symptomatic pedal deformities noted bilateral. Muscular strength within normal limits. ? ?ASSESSMENT ?1.  Pain due to onychomycosis of toenails bilateral ?2.  Diabetes mellitus; uncomplicated ? ?PLAN OF CARE ?1. Patient evaluated today.  Comprehensive diabetic foot exam performed today ?2. Instructed to maintain good pedal hygiene and foot care.  ?3. Mechanical debridement of nails 1-5 bilaterally performed using a nail nipper. Filed with dremel without incident.  ?4. Return to clinic in 3 mos.  ? ? ?Edrick Kins, DPM ?[facility] ? ?Dr. Edrick Kins, DPM  ?  ?[address]                                       ?[place], [place] [address]                ?Office [phone]  ?Fax [phone] ? ? ? ? ?

## 2021-05-05 ENCOUNTER — Other Ambulatory Visit: Payer: Self-pay | Admitting: Physician Assistant

## 2021-05-05 DIAGNOSIS — E1129 Type 2 diabetes mellitus with other diabetic kidney complication: Secondary | ICD-10-CM

## 2021-05-05 DIAGNOSIS — I10 Essential (primary) hypertension: Secondary | ICD-10-CM

## 2021-05-06 NOTE — Telephone Encounter (Signed)
[facility] faxed refill request for the following medications: ? ?glipiZIDE (GLUCOTROL XL) 10 MG 24 hr tablet  ? ?Please advise. ? ?

## 2021-05-07 ENCOUNTER — Other Ambulatory Visit: Payer: Self-pay | Admitting: Family Medicine

## 2021-05-07 DIAGNOSIS — E1159 Type 2 diabetes mellitus with other circulatory complications: Secondary | ICD-10-CM

## 2021-05-07 MED ORDER — LISINOPRIL 5 MG PO TABS: ORAL_TABLET | ORAL | 0 refills | Status: DC

## 2021-05-10 DIAGNOSIS — Z20822 Contact with and (suspected) exposure to covid-19: Secondary | ICD-10-CM | POA: Diagnosis not present

## 2021-05-15 ENCOUNTER — Ambulatory Visit (INDEPENDENT_AMBULATORY_CARE_PROVIDER_SITE_OTHER): Payer: Medicare Other | Admitting: Licensed Clinical Social Worker

## 2021-05-15 DIAGNOSIS — F32A Depression, unspecified: Secondary | ICD-10-CM

## 2021-05-15 DIAGNOSIS — F4323 Adjustment disorder with mixed anxiety and depressed mood: Secondary | ICD-10-CM | POA: Diagnosis not present

## 2021-05-15 NOTE — Progress Notes (Signed)
?THERAPIST PROGRESS NOTE ? ?Session Time: 4-445p ? ?Participation Level: Active ? ?Behavioral Response: NeatAlertAnxious and Depressed ? ?Type of Therapy: Individual Therapy ? ?Treatment Goals addressed: Problem: Depression CCP Problem  1 Decrease depressive symptoms and improve levels of effective functioning  ?Goal: LTG: Reduce frequency, intensity, and duration of depression symptoms as evidenced by: SSB input needed on appropriate metric--pt self report ?Outcome: Progressing ?Note: Pt reports fewer depression symptoms than at last session ?Intervention: REVIEW PLEASE SKILLS (TREAT PHYSICAL ILLNESS, BALANCE EATING, AVOID MOOD-ALTERING SUBSTANCES, BALANCE SLEEP AND GET EXERCISE) WITH Laylynn ?Intervention: Encourage verbalization of feelings/concerns/expectations ?  ?Problem: Anxiety Disorder CCP Problem  1 Reduce overall frequency, intensity, and duration of the anxiety so that daily functioning is not impaired.  ?Goal: LTG: Patient will score less than 5 on the Generalized Anxiety Disorder 7 Scale (GAD-7) ?Note: Pt reports fewer anxiety symptoms than at last session ?Intervention: Encourage patient to identify triggers ?Intervention: Assist with relaxation techniques, as appropriate (deep breathing exercises, meditation, guided imagery) ? ? ?ProgressTowards Goals: Progressing ? ?Interventions: CBT (see above) ? ?Summary: Melissa Arroyo is a 71 y.o. female who presents with improving symptoms related to depression. Pt reports that overall mood has been stable recently.    ? ?Allowed pt to explore and express thoughts and feelings associated with recent life situations and external stressors. Patient reports that she is still experiencing significant anxiety over completion of her disability paperwork. Patient was confused and thought that psychiatrist was responsible for completing disability paperwork, but the conversation was had with psychiatrist at last visit that six months of data was needed to support  completion of disability paperwork. It is documented that patient was told this multiple times, but patients still is insisting on some type of paperwork from psychiatrist stating that she was referred back to neuropsychiatrist for completion of disability paperwork.  ? ?Allowed patient to explore thoughts and feelings associated with recent diagnosis of cognitive disorder that she received from the neuropsychiatrist. Patient reports that they feel she may have ADHD symptoms. Patient reports that she still gets confused at times and remembering details. Patient reports that she is excited and hopeful about her new adventure with the stock market. Patient reports that she has a Geophysicist/field seismologist that she has live sessions with, and also watches videos about stock market moves.  ? ?Patient continued to discuss thoughts and feelings associated with her recent job termination, and feelings about certain employees. Patient feels that she is continuing to move forward and heal from this situation and she is ready to take next steps. Patient denies any negative thoughts about self and reports an improvement in overall depression symptoms. Patient reports that she is trying to use her coping skills to manage stressful moments and anxiety. ? ?Continued recommendations are as follows: self care behaviors, positive social engagements, focusing on overall work/home/life balance, and focusing on positive physical and emotional wellness.  ?.  ? ?Suicidal/Homicidal: No ? ?Therapist Response: Pt is continuing to apply interventions learned in session into daily life situations. Pt is currently on track to meet goals utilizing interventions mentioned above. Personal growth and progress noted. Treatment to continue as indicated.  ? ?Plan: Return again in 4 weeks. ? ?Diagnosis: Adjustment disorder with mixed anxiety and depressed mood ? ?Collaboration of Care: Other Pt encouraged to continue psychiatric care with Dr. Shea Evans and continue  with neuropsychiatry follow ups ? ?Patient/Guardian was advised Release of Information must be obtained prior to any record release in order to collaborate their  care with an outside provider. Patient/Guardian was advised if they have not already done so to contact the registration department to sign all necessary forms in order for [place] to release information regarding their care.  ? ?Consent: Patient/Guardian gives verbal consent for treatment and assignment of benefits for services provided during this visit. Patient/Guardian expressed understanding and agreed to proceed.  ? ?Granville Lewis, LCSW ?05/16/2021 ? ?

## 2021-05-16 DIAGNOSIS — F3176 Bipolar disorder, in full remission, most recent episode depressed: Secondary | ICD-10-CM | POA: Diagnosis not present

## 2021-05-16 DIAGNOSIS — F410 Panic disorder [episodic paroxysmal anxiety] without agoraphobia: Secondary | ICD-10-CM | POA: Diagnosis not present

## 2021-05-16 DIAGNOSIS — F3341 Major depressive disorder, recurrent, in partial remission: Secondary | ICD-10-CM | POA: Diagnosis not present

## 2021-05-16 DIAGNOSIS — F900 Attention-deficit hyperactivity disorder, predominantly inattentive type: Secondary | ICD-10-CM | POA: Diagnosis not present

## 2021-05-16 DIAGNOSIS — Z20822 Contact with and (suspected) exposure to covid-19: Secondary | ICD-10-CM | POA: Diagnosis not present

## 2021-05-16 DIAGNOSIS — F4011 Social phobia, generalized: Secondary | ICD-10-CM | POA: Diagnosis not present

## 2021-05-16 NOTE — Plan of Care (Signed)
?  Problem: Depression CCP Problem  1 Decrease depressive symptoms and improve levels of effective functioning  ?Goal: LTG: Reduce frequency, intensity, and duration of depression symptoms as evidenced by: SSB input needed on appropriate metric--pt self report ?Outcome: Progressing ?Note: Pt reports fewer depression symptoms than at last session ?Intervention: REVIEW PLEASE SKILLS (TREAT PHYSICAL ILLNESS, BALANCE EATING, AVOID MOOD-ALTERING SUBSTANCES, BALANCE SLEEP AND GET EXERCISE) WITH Melissa Arroyo ?Intervention: Encourage verbalization of feelings/concerns/expectations ?  ?Problem: Anxiety Disorder CCP Problem  1 Reduce overall frequency, intensity, and duration of the anxiety so that daily functioning is not impaired.  ?Goal: LTG: Patient will score less than 5 on the Generalized Anxiety Disorder 7 Scale (GAD-7) ?Note: Pt reports fewer anxiety symptoms than at last session ?Intervention: Encourage patient to identify triggers ?Intervention: Assist with relaxation techniques, as appropriate (deep breathing exercises, meditation, guided imagery) ?  ?

## 2021-05-17 ENCOUNTER — Telehealth: Payer: Medicare Other

## 2021-05-20 ENCOUNTER — Ambulatory Visit (INDEPENDENT_AMBULATORY_CARE_PROVIDER_SITE_OTHER): Payer: Medicare Other

## 2021-05-20 DIAGNOSIS — E1129 Type 2 diabetes mellitus with other diabetic kidney complication: Secondary | ICD-10-CM

## 2021-05-20 DIAGNOSIS — I152 Hypertension secondary to endocrine disorders: Secondary | ICD-10-CM

## 2021-05-20 NOTE — Chronic Care Management (AMB) (Signed)
?Chronic Care Management  ? ?CCM RN Visit Note ? ?05/20/2021 ?Name: Melissa Arroyo MRN: [id number] DOB: 10/20/1950 ? ?Subjective: ?Melissa Arroyo is a 71 y.o. year old female who is a primary care patient of Melissa Sprout, FNP. The care management team was consulted for assistance with disease management and care coordination needs.   ? ?Engaged with patient by telephone for follow up visit in response to provider referral for case management and care coordination services.  ? ?Consent to Services:  ?The patient was given information about Chronic Care Management services, agreed to services, and gave verbal consent prior to initiation of services.  Please see initial visit note for detailed documentation.  ? ?Assessment: Review of patient past medical history, allergies, medications, health status, including review of consultants reports, laboratory and other test data, was performed as part of comprehensive evaluation and provision of chronic care management services.  ? ?SDOH (Social Determinants of Health) assessments and interventions performed: No ? ?CCM Care Plan ? ?Allergies  ?Allergen Reactions  ? Vesicare [Solifenacin] Anaphylaxis  ?  Throat Closed  ? Penicillins Rash  ? ? ?Outpatient Encounter Medications as of 05/20/2021  ?Medication Sig  ? ACCU-CHEK GUIDE test strip USE TO TEST ONCE DAILY  ? Alcohol Swabs (ALCOHOL PREP) PADS Use 1 pad prior to checking blood sugar to clean skin  ? atorvastatin (LIPITOR) 40 MG tablet Take 1 tablet (40 mg total) by mouth daily.  ? b complex vitamins capsule Take 1 capsule by mouth daily.  ? blood glucose meter kit and supplies Dispense based on patient and insurance preference. Check glucose once daily. (FOR ICD-10 E11.29).  ? buPROPion (WELLBUTRIN XL) 300 MG 24 hr tablet Take 300 mg by mouth every morning.  ? chlorhexidine (PERIDEX) 0.12 % solution SMARTSIG:By Mouth  ? Cholecalciferol (VITAMIN D3) 10 MCG (400 UNIT) tablet Take by mouth daily. Dose unknown  ? dicyclomine  (BENTYL) 10 MG capsule Take 1 capsule (10 mg total) by mouth 4 (four) times daily -  before meals and at bedtime.  ? estradiol (ESTRACE) 0.1 MG/GM vaginal cream Place 1 Applicatorful vaginally at bedtime.  ? FLUoxetine (PROZAC) 20 MG capsule Take 3 capsules (60 mg total) by mouth daily.  ? FLUoxetine (PROZAC) 40 MG capsule Take 40 mg by mouth daily.  ? fluticasone (FLONASE SENSIMIST) 27.5 MCG/SPRAY nasal spray Place 1-2 sprays into the nose daily.  ? glipiZIDE (GLUCOTROL XL) 10 MG 24 hr tablet TAKE 1 TABLET(10 MG) BY MOUTH DAILY WITH BREAKFAST  ? Glucosamine-Chondroitin (MOVE FREE PO) Take 1 tablet by mouth daily.  ? ibuprofen (ADVIL,MOTRIN) 200 MG tablet Take 400 mg by mouth at bedtime. As needed  ? lisinopril (ZESTRIL) 5 MG tablet Take 1-2 tablets PO daily  based on home BP readings; Goal BP is <130/<80; Please let your provider know if your blood pressure is consistently above this goal.  ? metFORMIN (GLUCOPHAGE) 1000 MG tablet TAKE 1 TABLET(1000 MG) BY MOUTH TWICE DAILY WITH A MEAL  ? Multiple Vitamins-Minerals (CENTRUM SILVER PO) Take 1 tablet by mouth daily.  ? OLANZapine (ZYPREXA) 5 MG tablet Take 7.5 mg by mouth at bedtime.  ? pioglitazone (ACTOS) 30 MG tablet TAKE 1 TABLET(30 MG) BY MOUTH DAILY  ? propranolol (INDERAL) 10 MG tablet TAKE 1 TABLET(10 MG) BY MOUTH TWICE DAILY  ? zinc gluconate 50 MG tablet Take by mouth daily. Dose unknown  ? ?No facility-administered encounter medications on file as of 05/20/2021.  ? ? ?Patient Active Problem List  ? Diagnosis  Date Noted  ? Cognitive impairment 04/24/2021  ? Memory deficits 03/27/2021  ? Panic attack due to post traumatic stress disorder (PTSD) 03/27/2021  ? Thickened nails 02/19/2021  ? Nail fungus 02/19/2021  ? Medicare annual wellness visit, subsequent 02/19/2021  ? Abnormal mammogram 02/19/2021  ? Acquired hypothyroidism 02/19/2021  ? Need for shingles vaccine 02/19/2021  ? Cough due to bronchospasm 02/19/2021  ? Hypertension associated with diabetes ([place])  02/19/2021  ? Hyperlipidemia associated with type 2 diabetes mellitus ([place]) 07/24/2014  ? ? ?PLAN: ?A member of the care management team will follow up next month. ? ? ?Hilda Rynders,RN ?[place] ?[place] ?[phone] ? ? ? ? ? ? ? ? ?

## 2021-05-22 ENCOUNTER — Encounter: Payer: Medicare Other | Admitting: Speech Pathology

## 2021-05-27 ENCOUNTER — Ambulatory Visit: Payer: Medicare Other | Attending: Neurology

## 2021-05-27 DIAGNOSIS — R2681 Unsteadiness on feet: Secondary | ICD-10-CM | POA: Insufficient documentation

## 2021-05-27 DIAGNOSIS — Z20822 Contact with and (suspected) exposure to covid-19: Secondary | ICD-10-CM | POA: Diagnosis not present

## 2021-05-27 NOTE — Therapy (Signed)
?OUTPATIENT PHYSICAL THERAPY BALANCE EVALUATION ? ? ?Patient Name: Melissa Arroyo ?MRN: [id number] ?DOB:1951/01/11, 71 y.o., female ?Today's Date: 05/28/2021 ? ? PT End of Session - 05/27/21 1310   ? ? Visit Number 1   ? Number of Visits 25   ? Date for PT Re-Evaluation 08/19/21   ? Authorization Type eval: 05/27/21   ? PT Start Time 1315   ? PT Stop Time 1400   ? PT Time Calculation (min) 45 min   ? Equipment Utilized During Treatment Gait belt   ? Activity Tolerance Patient tolerated treatment well   ? Behavior During Therapy [facility] for tasks assessed/performed   ? ?  ?  ? ?  ? ? ?Past Medical History:  ?Diagnosis Date  ? Anxiety   ? Depression   ? Diabetes mellitus ([place])   ? Dyspnea   ? Heart attack ([place]) 04/11/2007  ? Heart attack ([place])   ? Hyperlipidemia   ? Hypertension   ? Obesity   ? Recurrent UTI   ? Renal lesion   ? Urinary incontinence in female   ? Vaginal atrophy   ? ?Past Surgical History:  ?Procedure Laterality Date  ? BREAST BIOPSY Right 03/29/2021  ? stereo bx-calcs, "RIBBON" clip-path pending  ? BUNIONECTOMY  2001  ? CARDIAC CATHETERIZATION    ? COLONOSCOPY WITH PROPOFOL N/A 11/27/2020  ? Procedure: COLONOSCOPY WITH PROPOFOL;  Surgeon: Virgel Manifold, MD;  Location: [facility] ENDOSCOPY;  Service: Endoscopy;  Laterality: N/A;  ? DENTAL SURGERY    ? NECK SURGERY    ? ROTATOR CUFF REPAIR Left   ? ?Patient Active Problem List  ? Diagnosis Date Noted  ? Cognitive impairment 04/24/2021  ? Memory deficits 03/27/2021  ? Panic attack due to post traumatic stress disorder (PTSD) 03/27/2021  ? Thickened nails 02/19/2021  ? Nail fungus 02/19/2021  ? Medicare annual wellness visit, subsequent 02/19/2021  ? Abnormal mammogram 02/19/2021  ? Acquired hypothyroidism 02/19/2021  ? Need for shingles vaccine 02/19/2021  ? Cough due to bronchospasm 02/19/2021  ? Hypertension associated with diabetes ([place]) 02/19/2021  ? Hyperlipidemia associated with type 2 diabetes mellitus ([place]) 07/24/2014  ? ? ?PCP: [name],  FNP ? ?REFERRING PROVIDER: Vladimir Crofts, MD ? ?REFERRING DIAGNOSIS: R26.89 (ICD-10-CM) - Other abnormalities of gait and mobility ? ?THERAPY DIAG: Unsteadiness on feet ? ?ONSET DATE: 11/26/21 (approximate) ? ?FOLLOW UP APPT WITH PROVIDER: Yes, May 2023 with neurology  ? ? ?SUBJECTIVE:                                                                                                                                                                                        ? ?  Chief Complaint: Unsteadiness ? ?Pertinent History ?Pt reports difficulty with her balance which started approximately 6 months ago. At the time it started she was feeling "run down" with little energy. Pt reports that "I'm supposed to walk but I don't have the motivation." She reports chronic low back pain if she stands for an extended period of time which also impacts her motivation to stand and walk. She has been evaluated at [facility] for her back pain and was prescribed Naproxen but she never followed-up due to other issues. She complains of leg weakness and states "If I sit too long it's hard for me to get up." She would like to join the gym but is having financial difficulties and cannot afford a membership at the time. Denies dizziness or vertigo. She is being followed by neurology and complains of memory difficulty, noting difficulty with short-term memory, multitasking, decision making, brain fog. Her memory problem started in 2018 and were gradual in nature. She went on disability Sept 25th 2022, due to memory impairment and panic attacks. Has previously been evaluated by neuropsych. Hx of stress incontinence. Pt previously worked as an Therapist, music.  ? ?Pain: Yes, chronic low back pain but not at the time of evaluation ?Numbness/Tingling: No ?Focal Weakness: Yes, pt reports increased R knee weakness recently ?Recent changes in overall health/medication: Yes, see history ?Prior history of physical therapy for balance:  No ?Falls: Has  patient fallen in last 6 months? No, ?Dominant hand: right ?Imaging: Yes, brain MRI microvascular ischemic and metabolic changes (has vascular risk factors of diabetes mellitus, obesity, etc) + whole brain volume in the 5th percentile  ?Prior level of function: Independent ?Occupational demands: Retired Therapist, music, on disability ?Hobbies: working in her garden ?Red flags: urinary incontinence, negative for bowel/bladder changes, saddle paresthesia, personal history of cancer, h/o spinal tumors, h/o compression fx, abdominal pain, chills/fever, night sweats, nausea, vomiting, unrelenting pain; ? ?Precautions: None ? ?Weight Bearing Restrictions: No ? ?Living Environment ?Lives with: lives with their spouse ?Lives in: House/apartment, two levels but pt lives on first level, 5 steps to enter with bilateral wide rails; ?Has following equipment at home: shower chair, tub shower with grab bars; ? ? ?Patient Goals: "I want to get my balance back and feel like I can walk around and do things. I would like to gain strength." ? ? ?OBJECTIVE:  ? ?Patient Surveys  ?FOTO: 43, predicted improvement to 50 ?ABC: 35% ? ? ?Cognition ?Patient is oriented to person, place, and time.  ?Pt reports difficulty with recent memory but no memory loss observed during intake; ?Remote memory is intact ?Attention span and concentration are intact during session however pt reports difficulty with concentration.  ?Expressive speech is intact.  ?Patient's fund of knowledge is within normal limits for educational level. ?   ? ?Gross Musculoskeletal Assessment ?Tremor: None (pt reports R hand tremor but not observed during evaluation); ?Bulk: Normal ?Tone: Normal ? ? ?GAIT: ?Distance walked: >200' over the course of the entire evaluation ?Assistive device utilized: None ?Level of assistance: Complete Independence ?Comments: Pt ambulates with wide base of support and decreased floor to toe clearance with some mild foot slap LLE. Forward head and rounded  shoulders with forward trunk flexion during gait.  ? ? ?Posture: ?Forward head and rounded shoulders ? ? ?AROM: Deferred full testing but no gross deficits observed during functional activity; ? ? ?LE MMT: ? ?MMT (out of 5) Right ?05/28/2021 Left ?05/28/2021  ?Hip flexion 4 4  ?Hip extension    ?  Hip abduction (seated) 5 5  ?Hip adduction (seated) 5 5  ?Hip internal rotation 5 5  ?Hip external rotation 5 5  ?Knee flexion (seated) 5 5  ?Knee extension  5 5  ?Ankle dorsiflexion 5 5  ?Ankle plantarflexion Active Active  ?Ankle inversion    ?Ankle eversion    ?(* = pain; Blank rows = not tested) ? ? ?Sensation ?Grossly intact to light touch bilateral LEs as determined by testing dermatomes L2-S2. Proprioception, and hot/cold testing deferred on this date. ? ? ?Reflexes ?Deferred ? ? ?Cranial Nerves ?Visual acuity and visual fields are intact (eye exam next Monday, no recent Rx changes);  ?Extraocular muscles are intact  ?Facial sensation is intact bilaterally  ?Facial strength is intact bilaterally  ?Hearing is normal as tested by gross conversation ?Palate elevates midline, normal phonation  ?Shoulder shrug strength is intact  ?Tongue protrudes midline ? ? ?Coordination/Cerebellar ?Finger to Nose: WNL ?Heel to Shin: WNL ?Rapid alternating movements: WNL ?Finger Opposition: WNL ?Pronator Drift: Negative ? ? ?FUNCTIONAL OUTCOME MEASURES ? ? Results Comments  ?BERG 51/56 Mild deficits, in need of intervention  ?DGI 23/24 WNL  ?30s Sit to Stand 13 repetitions WNL (normative age/gender)  ?TUG 10.8 seconds WNL  ?5TSTS 12.4 seconds WNL  ?6 Minute Walk Test    ?10 Meter Gait Speed Self-selected: 10.2 s = 0.98 m/s; Fastest: 8.3s = 1.2 m/s Self-selected slightly below community ambulation speed  ?(Blank rows = not tested) ? ?mCTSIB: Conditions 1-3: 30s, Condition 4: 5s; ? ? ?TODAY'S TREATMENT  ?None ? ? ?PATIENT EDUCATION:  ?Education details: Plan of care ?Person educated: Patient ?Education method: Explanation ?Education  comprehension: verbalized understanding ? ? ?HOME EXERCISE PROGRAM: ?None currently ? ? ?ASSESSMENT: ? ?CLINICAL IMPRESSION: ?Patient is a 71 y.o. female who was seen today for physical therapy evaluation and treatmen

## 2021-05-28 ENCOUNTER — Encounter: Payer: Medicare Other | Admitting: Speech Pathology

## 2021-05-29 NOTE — Therapy (Signed)
?OUTPATIENT PHYSICAL THERAPY BALANCE TREATMENT ? ? ?Patient Name: Melissa Arroyo ?MRN: [id number] ?DOB:August 07, 1950, 71 y.o., female ?Today's Date: 06/01/2021 ? ? PT End of Session - 06/01/21 1037   ? ? Visit Number 2   ? Number of Visits 25   ? Date for PT Re-Evaluation 08/19/21   ? Authorization Type eval: 05/27/21   ? PT Start Time 1315   ? PT Stop Time 1400   ? PT Time Calculation (min) 45 min   ? Equipment Utilized During Treatment Gait belt   ? Activity Tolerance Patient tolerated treatment well   ? Behavior During Therapy [facility] for tasks assessed/performed   ? ?  ?  ? ?  ? ? ? ?Past Medical History:  ?Diagnosis Date  ? Anxiety   ? Depression   ? Diabetes mellitus ([place])   ? Dyspnea   ? Heart attack ([place]) 04/11/2007  ? Heart attack ([place])   ? Hyperlipidemia   ? Hypertension   ? Obesity   ? Recurrent UTI   ? Renal lesion   ? Urinary incontinence in female   ? Vaginal atrophy   ? ?Past Surgical History:  ?Procedure Laterality Date  ? BREAST BIOPSY Right 03/29/2021  ? stereo bx-calcs, "RIBBON" clip-path pending  ? BUNIONECTOMY  2001  ? CARDIAC CATHETERIZATION    ? COLONOSCOPY WITH PROPOFOL N/A 11/27/2020  ? Procedure: COLONOSCOPY WITH PROPOFOL;  Surgeon: Virgel Manifold, MD;  Location: [facility] ENDOSCOPY;  Service: Endoscopy;  Laterality: N/A;  ? DENTAL SURGERY    ? NECK SURGERY    ? ROTATOR CUFF REPAIR Left   ? ?Patient Active Problem List  ? Diagnosis Date Noted  ? Cognitive impairment 04/24/2021  ? Memory deficits 03/27/2021  ? Panic attack due to post traumatic stress disorder (PTSD) 03/27/2021  ? Thickened nails 02/19/2021  ? Nail fungus 02/19/2021  ? Medicare annual wellness visit, subsequent 02/19/2021  ? Abnormal mammogram 02/19/2021  ? Acquired hypothyroidism 02/19/2021  ? Need for shingles vaccine 02/19/2021  ? Cough due to bronchospasm 02/19/2021  ? Hypertension associated with diabetes ([place]) 02/19/2021  ? Hyperlipidemia associated with type 2 diabetes mellitus ([place]) 07/24/2014  ? ? ?PCP: [name],  FNP ? ?REFERRING PROVIDER: Vladimir Crofts, MD ? ?REFERRING DIAGNOSIS: R26.89 (ICD-10-CM) - Other abnormalities of gait and mobility ? ?THERAPY DIAG: Unsteadiness on feet ? ?ONSET DATE: 11/26/21 (approximate) ? ?FOLLOW UP APPT WITH PROVIDER: Yes, May 2023 with neurology  ? ?FROM INITIAL EVALUATION ? ?SUBJECTIVE:                                                                                                                                                                                        ? ?  Chief Complaint: Unsteadiness ? ?Pertinent History ?Pt reports difficulty with her balance which started approximately 6 months ago. At the time it started she was feeling "run down" with little energy. Pt reports that "I'm supposed to walk but I don't have the motivation." She reports chronic low back pain if she stands for an extended period of time which also impacts her motivation to stand and walk. She has been evaluated at [facility] for her back pain and was prescribed Naproxen but she never followed-up due to other issues. She complains of leg weakness and states "If I sit too long it's hard for me to get up." She would like to join the gym but is having financial difficulties and cannot afford a membership at the time. Denies dizziness or vertigo. She is being followed by neurology and complains of memory difficulty, noting difficulty with short-term memory, multitasking, decision making, brain fog. Her memory problem started in 2018 and were gradual in nature. She went on disability Sept 25th 2022, due to memory impairment and panic attacks. Has previously been evaluated by neuropsych. Hx of stress incontinence. Pt previously worked as an Therapist, music.  ? ?Pain: Yes, chronic low back pain but not at the time of evaluation ?Numbness/Tingling: No ?Focal Weakness: Yes, pt reports increased R knee weakness recently ?Recent changes in overall health/medication: Yes, see history ?Prior history of physical therapy for  balance:  No ?Falls: Has patient fallen in last 6 months? No, ?Dominant hand: right ?Imaging: Yes, brain MRI microvascular ischemic and metabolic changes (has vascular risk factors of diabetes mellitus, obesity, etc) + whole brain volume in the 5th percentile  ?Prior level of function: Independent ?Occupational demands: Retired Therapist, music, on disability ?Hobbies: working in her garden ?Red flags: urinary incontinence, negative for bowel/bladder changes, saddle paresthesia, personal history of cancer, h/o spinal tumors, h/o compression fx, abdominal pain, chills/fever, night sweats, nausea, vomiting, unrelenting pain; ? ?Precautions: None ? ?Weight Bearing Restrictions: No ? ?Living Environment ?Lives with: lives with their spouse ?Lives in: House/apartment, two levels but pt lives on first level, 5 steps to enter with bilateral wide rails; ?Has following equipment at home: shower chair, tub shower with grab bars; ? ? ?Patient Goals: "I want to get my balance back and feel like I can walk around and do things. I would like to gain strength." ? ? ?OBJECTIVE:  ? ?Patient Surveys  ?FOTO: 43, predicted improvement to 50 ?ABC: 35% ? ? ?Cognition ?Patient is oriented to person, place, and time.  ?Pt reports difficulty with recent memory but no memory loss observed during intake; ?Remote memory is intact ?Attention span and concentration are intact during session however pt reports difficulty with concentration.  ?Expressive speech is intact.  ?Patient's fund of knowledge is within normal limits for educational level. ?   ? ?Gross Musculoskeletal Assessment ?Tremor: None (pt reports R hand tremor but not observed during evaluation); ?Bulk: Normal ?Tone: Normal ? ? ?GAIT: ?Distance walked: >200' over the course of the entire evaluation ?Assistive device utilized: None ?Level of assistance: Complete Independence ?Comments: Pt ambulates with wide base of support and decreased floor to toe clearance with some mild foot slap LLE.  Forward head and rounded shoulders with forward trunk flexion during gait.  ? ? ?Posture: ?Forward head and rounded shoulders ? ? ?AROM: Deferred full testing but no gross deficits observed during functional activity; ? ? ?LE MMT: ? ?MMT (out of 5) Right ?06/01/2021 Left ?06/01/2021  ?Hip flexion 4 4  ?Hip extension    ?  Hip abduction (seated) 5 5  ?Hip adduction (seated) 5 5  ?Hip internal rotation 5 5  ?Hip external rotation 5 5  ?Knee flexion (seated) 5 5  ?Knee extension  5 5  ?Ankle dorsiflexion 5 5  ?Ankle plantarflexion Active Active  ?Ankle inversion    ?Ankle eversion    ?(* = pain; Blank rows = not tested) ? ? ?Sensation ?Grossly intact to light touch bilateral LEs as determined by testing dermatomes L2-S2. Proprioception, and hot/cold testing deferred on this date. ? ? ?Reflexes ?Deferred ? ? ?Cranial Nerves ?Visual acuity and visual fields are intact (eye exam next Monday, no recent Rx changes);  ?Extraocular muscles are intact  ?Facial sensation is intact bilaterally  ?Facial strength is intact bilaterally  ?Hearing is normal as tested by gross conversation ?Palate elevates midline, normal phonation  ?Shoulder shrug strength is intact  ?Tongue protrudes midline ? ? ?Coordination/Cerebellar ?Finger to Nose: WNL ?Heel to Shin: WNL ?Rapid alternating movements: WNL ?Finger Opposition: WNL ?Pronator Drift: Negative ? ? ?FUNCTIONAL OUTCOME MEASURES ? ? Results Comments  ?BERG 51/56 Mild deficits, in need of intervention  ?DGI 23/24 WNL  ?30s Sit to Stand 13 repetitions WNL (normative age/gender)  ?TUG 10.8 seconds WNL  ?5TSTS 12.4 seconds WNL  ?6 Minute Walk Test    ?10 Meter Gait Speed Self-selected: 10.2 s = 0.98 m/s; Fastest: 8.3s = 1.2 m/s Self-selected slightly below community ambulation speed  ?(Blank rows = not tested) ? ?mCTSIB: Conditions 1-3: 30s, Condition 4: 5s; ? ? ?TODAY'S TREATMENT  ? ?SUBJECTIVE: Pt reports that she is doing well today. She continues to struggle with her chronic low back pain  but she denies pain today. No specific questions or concerns currently ? ?PAIN: None ? ?Ther-ex  ?NuStep L2 x 5 minutes for warm-up during interval history; ? ?Seated LAQ with 3# ankle weights 2 x 15 BLE; ? ?[address]

## 2021-05-30 ENCOUNTER — Encounter: Payer: Medicare Other | Admitting: Speech Pathology

## 2021-05-30 ENCOUNTER — Ambulatory Visit: Payer: Medicare Other

## 2021-05-30 DIAGNOSIS — R2681 Unsteadiness on feet: Secondary | ICD-10-CM | POA: Diagnosis not present

## 2021-05-31 DIAGNOSIS — F900 Attention-deficit hyperactivity disorder, predominantly inattentive type: Secondary | ICD-10-CM | POA: Diagnosis not present

## 2021-05-31 DIAGNOSIS — F3341 Major depressive disorder, recurrent, in partial remission: Secondary | ICD-10-CM | POA: Diagnosis not present

## 2021-05-31 DIAGNOSIS — F3176 Bipolar disorder, in full remission, most recent episode depressed: Secondary | ICD-10-CM | POA: Diagnosis not present

## 2021-05-31 DIAGNOSIS — F4011 Social phobia, generalized: Secondary | ICD-10-CM | POA: Diagnosis not present

## 2021-05-31 DIAGNOSIS — F410 Panic disorder [episodic paroxysmal anxiety] without agoraphobia: Secondary | ICD-10-CM | POA: Diagnosis not present

## 2021-06-03 DIAGNOSIS — E119 Type 2 diabetes mellitus without complications: Secondary | ICD-10-CM | POA: Diagnosis not present

## 2021-06-03 DIAGNOSIS — H2513 Age-related nuclear cataract, bilateral: Secondary | ICD-10-CM | POA: Diagnosis not present

## 2021-06-03 LAB — HM DIABETES EYE EXAM

## 2021-06-03 NOTE — Therapy (Incomplete)
?OUTPATIENT PHYSICAL THERAPY BALANCE TREATMENT ? ? ?Patient Name: Melissa Arroyo ?MRN: [id number] ?DOB:[birth year], 71 y.o., female ?Today's Date: 06/03/2021 ? ? ? ? ? ?Past Medical History:  ?Diagnosis Date  ? Anxiety   ? Depression   ? Diabetes mellitus ([place])   ? Dyspnea   ? Heart attack ([place]) 04/11/2007  ? Heart attack ([place])   ? Hyperlipidemia   ? Hypertension   ? Obesity   ? Recurrent UTI   ? Renal lesion   ? Urinary incontinence in female   ? Vaginal atrophy   ? ?Past Surgical History:  ?Procedure Laterality Date  ? BREAST BIOPSY Right 03/29/2021  ? stereo bx-calcs, "RIBBON" clip-path pending  ? BUNIONECTOMY  2001  ? CARDIAC CATHETERIZATION    ? COLONOSCOPY WITH PROPOFOL N/A 11/27/2020  ? Procedure: COLONOSCOPY WITH PROPOFOL;  Surgeon: Virgel Manifold, MD;  Location: [facility] ENDOSCOPY;  Service: Endoscopy;  Laterality: N/A;  ? DENTAL SURGERY    ? NECK SURGERY    ? ROTATOR CUFF REPAIR Left   ? ?Patient Active Problem List  ? Diagnosis Date Noted  ? Cognitive impairment 04/24/2021  ? Memory deficits 03/27/2021  ? Panic attack due to post traumatic stress disorder (PTSD) 03/27/2021  ? Thickened nails 02/19/2021  ? Nail fungus 02/19/2021  ? Medicare annual wellness visit, subsequent 02/19/2021  ? Abnormal mammogram 02/19/2021  ? Acquired hypothyroidism 02/19/2021  ? Need for shingles vaccine 02/19/2021  ? Cough due to bronchospasm 02/19/2021  ? Hypertension associated with diabetes ([place]) 02/19/2021  ? Hyperlipidemia associated with type 2 diabetes mellitus ([place]) 07/24/2014  ? ? ?PCP: [name], FNP ? ?REFERRING PROVIDER: [name], FNP ? ?REFERRING DIAGNOSIS: R26.89 (ICD-10-CM) - Other abnormalities of gait and mobility ? ?THERAPY DIAG: Unsteadiness on feet ? ?ONSET DATE: 11/26/21 (approximate) ? ?FOLLOW UP APPT WITH PROVIDER: Yes, May 2023 with neurology  ? ?FROM INITIAL EVALUATION ? ?SUBJECTIVE:                                                                                                                                                                                         ? ?Chief Complaint: Unsteadiness ? ?Pertinent History ?Pt reports difficulty with her balance which started approximately 6 months ago. At the time it started she was feeling "run down" with little energy. Pt reports that "I'm supposed to walk but I don't have the motivation." She reports chronic low back pain if she stands for an extended period of time which also impacts her motivation to stand and walk. She has been evaluated at [facility] for her back pain and was prescribed Naproxen but she never followed-up due  to other issues. She complains of leg weakness and states "If I sit too long it's hard for me to get up." She would like to join the gym but is having financial difficulties and cannot afford a membership at the time. Denies dizziness or vertigo. She is being followed by neurology and complains of memory difficulty, noting difficulty with short-term memory, multitasking, decision making, brain fog. Her memory problem started in 2018 and were gradual in nature. She went on disability Sept 25th 2022, due to memory impairment and panic attacks. Has previously been evaluated by neuropsych. Hx of stress incontinence. Pt previously worked as an Therapist, music.  ? ?Pain: Yes, chronic low back pain but not at the time of evaluation ?Numbness/Tingling: No ?Focal Weakness: Yes, pt reports increased R knee weakness recently ?Recent changes in overall health/medication: Yes, see history ?Prior history of physical therapy for balance:  No ?Falls: Has patient fallen in last 6 months? No, ?Dominant hand: right ?Imaging: Yes, brain MRI microvascular ischemic and metabolic changes (has vascular risk factors of diabetes mellitus, obesity, etc) + whole brain volume in the 5th percentile  ?Prior level of function: Independent ?Occupational demands: Retired Therapist, music, on disability ?Hobbies: working in her garden ?Red flags: urinary incontinence,  negative for bowel/bladder changes, saddle paresthesia, personal history of cancer, h/o spinal tumors, h/o compression fx, abdominal pain, chills/fever, night sweats, nausea, vomiting, unrelenting pain; ? ?Precautions: None ? ?Weight Bearing Restrictions: No ? ?Living Environment ?Lives with: lives with their spouse ?Lives in: House/apartment, two levels but pt lives on first level, 5 steps to enter with bilateral wide rails; ?Has following equipment at home: shower chair, tub shower with grab bars; ? ? ?Patient Goals: "I want to get my balance back and feel like I can walk around and do things. I would like to gain strength." ? ? ?OBJECTIVE:  ? ?Patient Surveys  ?FOTO: 43, predicted improvement to 50 ?ABC: 35% ? ? ?Cognition ?Patient is oriented to person, place, and time.  ?Pt reports difficulty with recent memory but no memory loss observed during intake; ?Remote memory is intact ?Attention span and concentration are intact during session however pt reports difficulty with concentration.  ?Expressive speech is intact.  ?Patient's fund of knowledge is within normal limits for educational level. ?   ? ?Gross Musculoskeletal Assessment ?Tremor: None (pt reports R hand tremor but not observed during evaluation); ?Bulk: Normal ?Tone: Normal ? ? ?GAIT: ?Distance walked: >200' over the course of the entire evaluation ?Assistive device utilized: None ?Level of assistance: Complete Independence ?Comments: Pt ambulates with wide base of support and decreased floor to toe clearance with some mild foot slap LLE. Forward head and rounded shoulders with forward trunk flexion during gait.  ? ? ?Posture: ?Forward head and rounded shoulders ? ? ?AROM: Deferred full testing but no gross deficits observed during functional activity; ? ? ?LE MMT: ? ?MMT (out of 5) Right ?06/03/2021 Left ?06/03/2021  ?Hip flexion 4 4  ?Hip extension    ?Hip abduction (seated) 5 5  ?Hip adduction (seated) 5 5  ?Hip internal rotation 5 5  ?Hip external  rotation 5 5  ?Knee flexion (seated) 5 5  ?Knee extension  5 5  ?Ankle dorsiflexion 5 5  ?Ankle plantarflexion Active Active  ?Ankle inversion    ?Ankle eversion    ?(* = pain; Blank rows = not tested) ? ? ?Sensation ?Grossly intact to light touch bilateral LEs as determined by testing dermatomes L2-S2. Proprioception, and hot/cold testing deferred on this date. ? ? ?  Reflexes ?Deferred ? ? ?Cranial Nerves ?Visual acuity and visual fields are intact (eye exam next Monday, no recent Rx changes);  ?Extraocular muscles are intact  ?Facial sensation is intact bilaterally  ?Facial strength is intact bilaterally  ?Hearing is normal as tested by gross conversation ?Palate elevates midline, normal phonation  ?Shoulder shrug strength is intact  ?Tongue protrudes midline ? ? ?Coordination/Cerebellar ?Finger to Nose: WNL ?Heel to Shin: WNL ?Rapid alternating movements: WNL ?Finger Opposition: WNL ?Pronator Drift: Negative ? ? ?FUNCTIONAL OUTCOME MEASURES ? ? Results Comments  ?BERG 51/56 Mild deficits, in need of intervention  ?DGI 23/24 WNL  ?30s Sit to Stand 13 repetitions WNL (normative age/gender)  ?TUG 10.8 seconds WNL  ?5TSTS 12.4 seconds WNL  ?6 Minute Walk Test    ?10 Meter Gait Speed Self-selected: 10.2 s = 0.98 m/s; Fastest: 8.3s = 1.2 m/s Self-selected slightly below community ambulation speed  ?(Blank rows = not tested) ? ?mCTSIB: Conditions 1-3: 30s, Condition 4: 5s; ? ? ?TODAY'S TREATMENT  ? ?SUBJECTIVE: Pt reports that she is doing well today. She continues to struggle with her chronic low back pain but she denies pain today. No specific questions or concerns currently ? ?PAIN: None ? ?Ther-ex  ?NuStep L2 x 5 minutes for warm-up during interval history; ? ?Seated LAQ with 3# ankle weights 2 x 15 BLE; ? ?Standing hip strengthening with 3# ankle weights: ?Hip flexion marches 2 x 15 BLE; ?HS curls 2 x 15 BLE; ?Hip abduction 2 x 15 BLE; ?Hip extension 2 x 15 BLE; ? ?Standing heel/toe raises with BUE support 2 x 15  BLE; ?Sit to stand from regular height chair without UE support with overhead 6# medicine ball press 2 x 10; ?Squats without UE support 2 x 10, cues for proper foot positioning and hip hinge; ?Heel/toe gait in

## 2021-06-04 ENCOUNTER — Encounter: Payer: Medicare Other | Admitting: Speech Pathology

## 2021-06-04 ENCOUNTER — Other Ambulatory Visit: Payer: Self-pay | Admitting: Family Medicine

## 2021-06-04 ENCOUNTER — Ambulatory Visit: Payer: Medicare Other | Admitting: Physical Therapy

## 2021-06-04 ENCOUNTER — Ambulatory Visit: Payer: Medicare Other

## 2021-06-04 DIAGNOSIS — R2681 Unsteadiness on feet: Secondary | ICD-10-CM

## 2021-06-05 ENCOUNTER — Ambulatory Visit: Payer: Medicare Other

## 2021-06-05 DIAGNOSIS — R2681 Unsteadiness on feet: Secondary | ICD-10-CM | POA: Diagnosis not present

## 2021-06-05 NOTE — Therapy (Signed)
?OUTPATIENT PHYSICAL THERAPY BALANCE TREATMENT ? ? ?Patient Name: Melissa Arroyo ?MRN: [id number] ?DOB:[birth year], 71 y.o., female ?Today's Date: 06/05/2021 ? ? PT End of Session - 06/05/21 6606   ? ? Visit Number 3   ? Number of Visits 25   ? Date for PT Re-Evaluation 08/19/21   ? Authorization Type eval: 05/27/21   ? PT Start Time [id number]   ? PT Stop Time 0930   ? PT Time Calculation (min) 41 min   ? Equipment Utilized During Treatment Gait belt   ? Activity Tolerance Patient tolerated treatment well   ? Behavior During Therapy [facility] for tasks assessed/performed   ? ?  ?  ? ?  ? ? ? ? ?Past Medical History:  ?Diagnosis Date  ? Anxiety   ? Depression   ? Diabetes mellitus ([place])   ? Dyspnea   ? Heart attack ([place]) 04/11/2007  ? Heart attack ([place])   ? Hyperlipidemia   ? Hypertension   ? Obesity   ? Recurrent UTI   ? Renal lesion   ? Urinary incontinence in female   ? Vaginal atrophy   ? ?Past Surgical History:  ?Procedure Laterality Date  ? BREAST BIOPSY Right 03/29/2021  ? stereo bx-calcs, "RIBBON" clip-path pending  ? BUNIONECTOMY  2001  ? CARDIAC CATHETERIZATION    ? COLONOSCOPY WITH PROPOFOL N/A 11/27/2020  ? Procedure: COLONOSCOPY WITH PROPOFOL;  Surgeon: Virgel Manifold, MD;  Location: [facility] ENDOSCOPY;  Service: Endoscopy;  Laterality: N/A;  ? DENTAL SURGERY    ? NECK SURGERY    ? ROTATOR CUFF REPAIR Left   ? ?Patient Active Problem List  ? Diagnosis Date Noted  ? Cognitive impairment 04/24/2021  ? Memory deficits 03/27/2021  ? Panic attack due to post traumatic stress disorder (PTSD) 03/27/2021  ? Thickened nails 02/19/2021  ? Nail fungus 02/19/2021  ? Medicare annual wellness visit, subsequent 02/19/2021  ? Abnormal mammogram 02/19/2021  ? Acquired hypothyroidism 02/19/2021  ? Need for shingles vaccine 02/19/2021  ? Cough due to bronchospasm 02/19/2021  ? Hypertension associated with diabetes ([place]) 02/19/2021  ? Hyperlipidemia associated with type 2 diabetes mellitus ([place]) 07/24/2014  ? ? ?PCP: [name],  FNP ? ?REFERRING PROVIDER: Vladimir Crofts, MD ? ?REFERRING DIAGNOSIS: R26.89 (ICD-10-CM) - Other abnormalities of gait and mobility ? ?THERAPY DIAG: Unsteadiness on feet ? ?ONSET DATE: 11/26/21 (approximate) ? ?FOLLOW UP APPT WITH PROVIDER: Yes, May 2023 with neurology  ? ?FROM INITIAL EVALUATION ? ?SUBJECTIVE:                                                                                                                                                                                        ? ?  Chief Complaint: Unsteadiness ? ?Pertinent History ?Pt reports difficulty with her balance which started approximately 6 months ago. At the time it started she was feeling "run down" with little energy. Pt reports that "I'm supposed to walk but I don't have the motivation." She reports chronic low back pain if she stands for an extended period of time which also impacts her motivation to stand and walk. She has been evaluated at [facility] for her back pain and was prescribed Naproxen but she never followed-up due to other issues. She complains of leg weakness and states "If I sit too long it's hard for me to get up." She would like to join the gym but is having financial difficulties and cannot afford a membership at the time. Denies dizziness or vertigo. She is being followed by neurology and complains of memory difficulty, noting difficulty with short-term memory, multitasking, decision making, brain fog. Her memory problem started in 2018 and were gradual in nature. She went on disability Sept 25th 2022, due to memory impairment and panic attacks. Has previously been evaluated by neuropsych. Hx of stress incontinence. Pt previously worked as an Therapist, music.  ? ?Pain: Yes, chronic low back pain but not at the time of evaluation ?Numbness/Tingling: No ?Focal Weakness: Yes, pt reports increased R knee weakness recently ?Recent changes in overall health/medication: Yes, see history ?Prior history of physical therapy for  balance:  No ?Falls: Has patient fallen in last 6 months? No, ?Dominant hand: right ?Imaging: Yes, brain MRI microvascular ischemic and metabolic changes (has vascular risk factors of diabetes mellitus, obesity, etc) + whole brain volume in the 5th percentile  ?Prior level of function: Independent ?Occupational demands: Retired Therapist, music, on disability ?Hobbies: working in her garden ?Red flags: urinary incontinence, negative for bowel/bladder changes, saddle paresthesia, personal history of cancer, h/o spinal tumors, h/o compression fx, abdominal pain, chills/fever, night sweats, nausea, vomiting, unrelenting pain; ? ?Precautions: None ? ?Weight Bearing Restrictions: No ? ?Living Environment ?Lives with: lives with their spouse ?Lives in: House/apartment, two levels but pt lives on first level, 5 steps to enter with bilateral wide rails; ?Has following equipment at home: shower chair, tub shower with grab bars; ? ? ?Patient Goals: "I want to get my balance back and feel like I can walk around and do things. I would like to gain strength." ? ? ?OBJECTIVE:  ? ?Patient Surveys  ?FOTO: 43, predicted improvement to 50 ?ABC: 35% ? ? ?Cognition ?Patient is oriented to person, place, and time.  ?Pt reports difficulty with recent memory but no memory loss observed during intake; ?Remote memory is intact ?Attention span and concentration are intact during session however pt reports difficulty with concentration.  ?Expressive speech is intact.  ?Patient's fund of knowledge is within normal limits for educational level. ?   ? ?Gross Musculoskeletal Assessment ?Tremor: None (pt reports R hand tremor but not observed during evaluation); ?Bulk: Normal ?Tone: Normal ? ? ?GAIT: ?Distance walked: >200' over the course of the entire evaluation ?Assistive device utilized: None ?Level of assistance: Complete Independence ?Comments: Pt ambulates with wide base of support and decreased floor to toe clearance with some mild foot slap LLE.  Forward head and rounded shoulders with forward trunk flexion during gait.  ? ? ?Posture: ?Forward head and rounded shoulders ? ? ?AROM: Deferred full testing but no gross deficits observed during functional activity; ? ? ?LE MMT: ? ?MMT (out of 5) Right ?06/05/2021 Left ?06/05/2021  ?Hip flexion 4 4  ?Hip extension    ?  Hip abduction (seated) 5 5  ?Hip adduction (seated) 5 5  ?Hip internal rotation 5 5  ?Hip external rotation 5 5  ?Knee flexion (seated) 5 5  ?Knee extension  5 5  ?Ankle dorsiflexion 5 5  ?Ankle plantarflexion Active Active  ?Ankle inversion    ?Ankle eversion    ?(* = pain; Blank rows = not tested) ? ? ?Sensation ?Grossly intact to light touch bilateral LEs as determined by testing dermatomes L2-S2. Proprioception, and hot/cold testing deferred on this date. ? ? ?Reflexes ?Deferred ? ? ?Cranial Nerves ?Visual acuity and visual fields are intact (eye exam next Monday, no recent Rx changes);  ?Extraocular muscles are intact  ?Facial sensation is intact bilaterally  ?Facial strength is intact bilaterally  ?Hearing is normal as tested by gross conversation ?Palate elevates midline, normal phonation  ?Shoulder shrug strength is intact  ?Tongue protrudes midline ? ? ?Coordination/Cerebellar ?Finger to Nose: WNL ?Heel to Shin: WNL ?Rapid alternating movements: WNL ?Finger Opposition: WNL ?Pronator Drift: Negative ? ? ?FUNCTIONAL OUTCOME MEASURES ? ? Results Comments  ?BERG 51/56 Mild deficits, in need of intervention  ?DGI 23/24 WNL  ?30s Sit to Stand 13 repetitions WNL (normative age/gender)  ?TUG 10.8 seconds WNL  ?5TSTS 12.4 seconds WNL  ?6 Minute Walk Test    ?10 Meter Gait Speed Self-selected: 10.2 s = 0.98 m/s; Fastest: 8.3s = 1.2 m/s Self-selected slightly below community ambulation speed  ?(Blank rows = not tested) ? ?mCTSIB: Conditions 1-3: 30s, Condition 4: 5s; ? ? ?TODAY'S TREATMENT  ? ?SUBJECTIVE: Pt reports that she is doing well today. She continues to struggle with her chronic low back pain  but she denies pain today. No specific questions or concerns currently ? ?PAIN: None ? ?Ther-ex  ?Seated LAQ with 4# ankle weights 2 x 15 BLE; ? ?Standing hip strengthening with 4# ankle weights: ?Hip flexion

## 2021-06-05 NOTE — Progress Notes (Signed)
?  ? ?I,Roshena L Chambers,acting as a scribe for [name], FNP.,have documented all relevant documentation on the behalf of [name], FNP,as directed by  [name], FNP while in the presence of [name], FNP.  ? ?Established patient visit ? ? ?Patient: Melissa Arroyo   DOB: 05/02/1950   71 y.o. Female  MRN: [id number] ?Visit Date: 06/06/2021 ? ?Today's healthcare provider: [name], FNP  ?Re Introduced to nurse practitioner role and practice setting.  All questions answered.  Discussed provider/patient relationship and expectations. ? ? ?Chief Complaint  ?Patient presents with  ? Back Pain  ? ?Subjective  ?  ?Back Pain ?This is a new problem. Episode onset: 4 weeks ago. The problem has been gradually improving since onset. The pain is present in the lumbar spine. Exacerbated by: prolonged standing or washing the dishes. Pertinent negatives include no abdominal pain, chest pain, fever or weakness. Treatments tried: physical therapy.   ?Patient reports having a history of chronic back pain.  ? ?Medications: ?Outpatient Medications Prior to Visit  ?Medication Sig  ? ACCU-CHEK GUIDE test strip USE TO TEST ONCE DAILY  ? Alcohol Swabs (ALCOHOL PREP) PADS Use 1 pad prior to checking blood sugar to clean skin  ? atorvastatin (LIPITOR) 40 MG tablet TAKE 1 TABLET(40 MG) BY MOUTH DAILY  ? b complex vitamins capsule Take 1 capsule by mouth daily.  ? blood glucose meter kit and supplies Dispense based on patient and insurance preference. Check glucose once daily. (FOR ICD-10 E11.29).  ? buPROPion (WELLBUTRIN XL) 300 MG 24 hr tablet Take 300 mg by mouth every morning.  ? chlorhexidine (PERIDEX) 0.12 % solution SMARTSIG:By Mouth  ? Cholecalciferol (VITAMIN D3) 10 MCG (400 UNIT) tablet Take by mouth daily. Dose unknown  ? dicyclomine (BENTYL) 10 MG capsule Take 1 capsule (10 mg total) by mouth 4 (four) times daily -  before meals and at bedtime.  ? donepezil (ARICEPT) 5 MG tablet Take 1 tablet by mouth  daily.  ? estradiol (ESTRACE) 0.1 MG/GM vaginal cream Place 1 Applicatorful vaginally at bedtime.  ? FLUoxetine (PROZAC) 20 MG capsule Take 3 capsules (60 mg total) by mouth daily.  ? FLUoxetine (PROZAC) 40 MG capsule Take 40 mg by mouth daily.  ? fluticasone (FLONASE SENSIMIST) 27.5 MCG/SPRAY nasal spray Place 1-2 sprays into the nose daily.  ? glipiZIDE (GLUCOTROL XL) 10 MG 24 hr tablet TAKE 1 TABLET(10 MG) BY MOUTH DAILY WITH BREAKFAST  ? Glucosamine-Chondroitin (MOVE FREE PO) Take 1 tablet by mouth daily.  ? ibuprofen (ADVIL,MOTRIN) 200 MG tablet Take 400 mg by mouth at bedtime. As needed  ? lisinopril (ZESTRIL) 5 MG tablet Take 1-2 tablets PO daily  based on home BP readings; Goal BP is <130/<80; Please let your provider know if your blood pressure is consistently above this goal.  ? Multiple Vitamins-Minerals (CENTRUM SILVER PO) Take 1 tablet by mouth daily.  ? OLANZapine (ZYPREXA) 5 MG tablet Take 7.5 mg by mouth at bedtime.  ? pioglitazone (ACTOS) 30 MG tablet TAKE 1 TABLET(30 MG) BY MOUTH DAILY  ? propranolol (INDERAL) 10 MG tablet TAKE 1 TABLET(10 MG) BY MOUTH TWICE DAILY  ? zinc gluconate 50 MG tablet Take by mouth daily. Dose unknown  ? [DISCONTINUED] metFORMIN (GLUCOPHAGE) 1000 MG tablet TAKE 1 TABLET(1000 MG) BY MOUTH TWICE DAILY WITH A MEAL  ? ?No facility-administered medications prior to visit.  ? ? ?Review of Systems  ?Constitutional:  Negative for appetite change, chills, fatigue and  fever.  ?Respiratory:  Negative for chest tightness and shortness of breath.   ?Cardiovascular:  Negative for chest pain and palpitations.  ?Gastrointestinal:  Positive for diarrhea (x 4 weeks). Negative for abdominal pain, nausea and vomiting.  ?Musculoskeletal:  Positive for back pain.  ?Neurological:  Negative for dizziness and weakness.  ? ? ?  Objective  ?  ?BP (!) 132/56 (BP Location: Left Arm, Patient Position: Sitting, Cuff Size: Large)   Pulse 60   Temp 97.7 ?F (36.5 ?C) (Oral)   Resp 16   Wt 207 lb (93.9  kg)   SpO2 100% Comment: room air  BMI 34.45 kg/m?  ? ? ?Physical Exam ?Vitals and nursing note reviewed.  ?Constitutional:   ?   General: She is not in acute distress. ?   Appearance: Normal appearance. She is obese. She is not ill-appearing, toxic-appearing or diaphoretic.  ?HENT:  ?   Head: Normocephalic and atraumatic.  ?Cardiovascular:  ?   Rate and Rhythm: Normal rate and regular rhythm.  ?   Pulses: Normal pulses.  ?   Heart sounds: Normal heart sounds. No murmur heard. ?  No friction rub. No gallop.  ?Pulmonary:  ?   Effort: Pulmonary effort is normal. No respiratory distress.  ?   Breath sounds: Normal breath sounds. No stridor. No wheezing, rhonchi or rales.  ?Chest:  ?   Chest wall: No tenderness.  ?Abdominal:  ?   General: Bowel sounds are normal.  ?   Palpations: Abdomen is soft.  ?Musculoskeletal:     ?   General: Tenderness present. No swelling, deformity or signs of injury. Normal range of motion.  ?     Arms: ? ?   Right lower leg: No edema.  ?   Left lower leg: No edema.  ?Skin: ?   General: Skin is warm and dry.  ?   Capillary Refill: Capillary refill takes less than 2 seconds.  ?   Coloration: Skin is not jaundiced or pale.  ?   Findings: No bruising, erythema, lesion or rash.  ?Neurological:  ?   General: No focal deficit present.  ?   Mental Status: She is alert and oriented to person, place, and time. Mental status is at baseline.  ?   Cranial Nerves: No cranial nerve deficit.  ?   Sensory: No sensory deficit.  ?   Motor: No weakness.  ?   Coordination: Coordination normal.  ?Psychiatric:     ?   Mood and Affect: Mood normal.     ?   Behavior: Behavior normal.     ?   Thought Content: Thought content normal.     ?   Judgment: Judgment normal.  ? ? ?No results found for any visits on 06/06/21. ? Assessment & Plan  ?  ? ?Problem List Items Addressed This Visit   ? ?  ? Cardiovascular and Mediastinum  ? Hypertension associated with diabetes ([place])  ?  Chronic, stable <130/<80 at home ?Denies  CP ?Denies SOB/ DOE ?Denies low blood pressure/hypotension ?Denies vision changes ?Denies LE Edema  ?Continue medication, lisinopril 5mg  ?RTC 3 months  ?Seek emergent care if you develop chest pain or chest pressure ? ?  ?  ? Relevant Medications  ? metFORMIN (GLUCOPHAGE-XR) 500 MG 24 hr tablet  ?  ? Endocrine  ? Type 2 diabetes mellitus with microalbuminuria, without long-term current use of insulin (HCC) - Primary  ?  Chronic, unstable previously ?Not due for A1c check at this  time ?Complaints of diarrhea, likely related to metformin, will switch to XR ?Encouraged to come back if bowel complaints are not improved ?Continue to recommend balanced, lower carb meals. Smaller meal size, adding snacks. Choosing water as drink of choice and increasing purposeful exercise. ?Reports FBG 120-145s ? ?  ?  ? Relevant Medications  ? metFORMIN (GLUCOPHAGE-XR) 500 MG 24 hr tablet  ?  ? Other  ? Chronic bilateral low back pain without sciatica  ?  Chronic, exacerbated ?Has started being more active- trying to get 2,500 steps/day- has a fitness tracker ?Has seen PT and wishes to continue for low back complaints; previously established for LE weakness and poor balance ?Additional referral placed ?Encouraged use of Mobic 15 mg qD with additional tylenol if needed.  ?Recommend low dose Flexeril qHS, with additional dosing if needed, up to TID ? ?  ?  ? Relevant Medications  ? cyclobenzaprine (FLEXERIL) 5 MG tablet  ? meloxicam (MOBIC) 7.5 MG tablet  ? Other Relevant Orders  ? Ambulatory referral to Physical Therapy  ? Depression, recurrent ([place])  ?  Chronic, unstable ?Denies SI or HI ?Denies self harm intent ?Exacerbated d/t acute illness- acute on chronic back pain and diarrhea x4 wks ?Has counselor ?Continues medication without complaint- 300 mg wellbutrin xl; prozac 60 mg; zyprexa 7.5 mg qHS ? ? ?  ?  ? ? ? ?Return if symptoms worsen or fail to improve.  ?   ? ?I, [name], FNP, have reviewed all documentation for this visit.  The documentation on 06/06/21 for the exam, diagnosis, procedures, and orders are all accurate and complete. ? ? ? ?[name], FNP  ?[place] ?[phone] (phone) ?[phone] (fax) ? ?[facility]

## 2021-06-06 ENCOUNTER — Encounter: Payer: Self-pay | Admitting: Family Medicine

## 2021-06-06 ENCOUNTER — Ambulatory Visit: Payer: Medicare Other

## 2021-06-06 ENCOUNTER — Telehealth: Payer: Self-pay | Admitting: *Deleted

## 2021-06-06 ENCOUNTER — Encounter: Payer: Medicare Other | Admitting: Speech Pathology

## 2021-06-06 ENCOUNTER — Ambulatory Visit (INDEPENDENT_AMBULATORY_CARE_PROVIDER_SITE_OTHER): Payer: Medicare Other | Admitting: Family Medicine

## 2021-06-06 ENCOUNTER — Other Ambulatory Visit: Payer: Self-pay | Admitting: Family Medicine

## 2021-06-06 VITALS — BP 132/56 | HR 60 | Temp 97.7°F | Resp 16 | Wt 207.0 lb

## 2021-06-06 DIAGNOSIS — E1159 Type 2 diabetes mellitus with other circulatory complications: Secondary | ICD-10-CM | POA: Diagnosis not present

## 2021-06-06 DIAGNOSIS — I152 Hypertension secondary to endocrine disorders: Secondary | ICD-10-CM | POA: Diagnosis not present

## 2021-06-06 DIAGNOSIS — E1129 Type 2 diabetes mellitus with other diabetic kidney complication: Secondary | ICD-10-CM

## 2021-06-06 DIAGNOSIS — R809 Proteinuria, unspecified: Secondary | ICD-10-CM | POA: Diagnosis not present

## 2021-06-06 DIAGNOSIS — M545 Low back pain, unspecified: Secondary | ICD-10-CM | POA: Diagnosis not present

## 2021-06-06 DIAGNOSIS — G8929 Other chronic pain: Secondary | ICD-10-CM

## 2021-06-06 DIAGNOSIS — R2681 Unsteadiness on feet: Secondary | ICD-10-CM | POA: Diagnosis not present

## 2021-06-06 DIAGNOSIS — F339 Major depressive disorder, recurrent, unspecified: Secondary | ICD-10-CM | POA: Insufficient documentation

## 2021-06-06 MED ORDER — CYCLOBENZAPRINE HCL 5 MG PO TABS: 5.0000 mg | ORAL_TABLET | Freq: Three times a day (TID) | ORAL | 1 refills | Status: DC | PRN

## 2021-06-06 MED ORDER — METFORMIN HCL ER 500 MG PO TB24: 1000.0000 mg | ORAL_TABLET | Freq: Two times a day (BID) | ORAL | 2 refills | Status: DC

## 2021-06-06 MED ORDER — MELOXICAM 7.5 MG PO TABS: 15.0000 mg | ORAL_TABLET | Freq: Every day | ORAL | 0 refills | Status: DC

## 2021-06-06 NOTE — Assessment & Plan Note (Signed)
Chronic, stable <130/<80 at home ?Denies CP ?Denies SOB/ DOE ?Denies low blood pressure/hypotension ?Denies vision changes ?Denies LE Edema  ?Continue medication, lisinopril '5mg'$  ?RTC 3 months  ?Seek emergent care if you develop chest pain or chest pressure ? ?

## 2021-06-06 NOTE — Assessment & Plan Note (Signed)
Chronic, unstable previously ?Not due for A1c check at this time ?Complaints of diarrhea, likely related to metformin, will switch to XR ?Encouraged to come back if bowel complaints are not improved ?Continue to recommend balanced, lower carb meals. Smaller meal size, adding snacks. Choosing water as drink of choice and increasing purposeful exercise. ?Reports FBG 120-145s ?

## 2021-06-06 NOTE — Telephone Encounter (Signed)
?  Care Management  ? ?Follow Up Note ? ? ?06/06/2021 ?Name: KENYONNA MICEK MRN: [id number] DOB: 1950/05/24 ? ? ?Referred by: [name], FNP ?Reason for referral : Care Coordination ? ? ?An unsuccessful telephone outreach was attempted today. The patient was referred to the case management team for assistance with care management and care coordination.  ? ?Follow Up Plan: Telephone follow up appointment with care management team member to be scheduled by careguide ? ? ?Daryan Cagley, LCSW ?Clinical Social Worker  ?[facility] ?[phone]  ? ?

## 2021-06-06 NOTE — Assessment & Plan Note (Signed)
Chronic, exacerbated ?Has started being more active- trying to get 2,500 steps/day- has a fitness tracker ?Has seen PT and wishes to continue for low back complaints; previously established for LE weakness and poor balance ?Additional referral placed ?Encouraged use of Mobic 15 mg qD with additional tylenol if needed.  ?Recommend low dose Flexeril qHS, with additional dosing if needed, up to TID ?

## 2021-06-06 NOTE — Chronic Care Management (AMB) (Signed)
  Chronic Care Management   Note  06/06/2021 Name: Melissa Arroyo MRN: [id number] DOB: 11/20/1950  Melissa Arroyo is a 71 y.o. year old female who is a primary care patient of [name], FNP. Melissa Arroyo is currently enrolled in care management services. An additional referral for Licensed Clinical SW was placed.   Follow up plan: Unsuccessful telephone outreach attempt made. A HIPAA compliant phone message was left for the patient providing contact information and requesting a return call.   Julian Hy, [place] [facility]  Direct Dial: [phone]

## 2021-06-06 NOTE — Therapy (Signed)
?OUTPATIENT PHYSICAL THERAPY BALANCE TREATMENT ? ? ?Patient Name: Melissa Arroyo ?MRN: [id number] ?DOB:03-15-1950, 71 y.o., female ?Today's Date: 06/06/2021 ? ? PT End of Session - 06/06/21 1319   ? ? Visit Number 4   ? Number of Visits 25   ? Date for PT Re-Evaluation 08/19/21   ? Authorization Type eval: 05/27/21   ? PT Start Time 7867   ? PT Stop Time 1400   ? PT Time Calculation (min) 43 min   ? Equipment Utilized During Treatment Gait belt   ? Activity Tolerance Patient tolerated treatment well   ? Behavior During Therapy [facility] for tasks assessed/performed   ? ?  ?  ? ?  ? ? ? ? ? ?Past Medical History:  ?Diagnosis Date  ? Anxiety   ? Depression   ? Diabetes mellitus ([place])   ? Dyspnea   ? Heart attack ([place]) 04/11/2007  ? Heart attack ([place])   ? Hyperlipidemia   ? Hypertension   ? Obesity   ? Recurrent UTI   ? Renal lesion   ? Urinary incontinence in female   ? Vaginal atrophy   ? ?Past Surgical History:  ?Procedure Laterality Date  ? BREAST BIOPSY Right 03/29/2021  ? stereo bx-calcs, "RIBBON" clip-path pending  ? BUNIONECTOMY  2001  ? CARDIAC CATHETERIZATION    ? COLONOSCOPY WITH PROPOFOL N/A 11/27/2020  ? Procedure: COLONOSCOPY WITH PROPOFOL;  Surgeon: Virgel Manifold, MD;  Location: [facility] ENDOSCOPY;  Service: Endoscopy;  Laterality: N/A;  ? DENTAL SURGERY    ? NECK SURGERY    ? ROTATOR CUFF REPAIR Left   ? ?Patient Active Problem List  ? Diagnosis Date Noted  ? Type 2 diabetes mellitus with microalbuminuria, without long-term current use of insulin ([place]) 06/06/2021  ? Depression, recurrent ([place]) 06/06/2021  ? Cognitive impairment 04/24/2021  ? Memory deficits 03/27/2021  ? Panic attack due to post traumatic stress disorder (PTSD) 03/27/2021  ? Thickened nails 02/19/2021  ? Nail fungus 02/19/2021  ? Medicare annual wellness visit, subsequent 02/19/2021  ? Abnormal mammogram 02/19/2021  ? Acquired hypothyroidism 02/19/2021  ? Need for shingles vaccine 02/19/2021  ? Cough due to bronchospasm 02/19/2021  ?  Hypertension associated with diabetes ([place]) 02/19/2021  ? Chronic bilateral low back pain without sciatica 11/09/2020  ? Hyperlipidemia associated with type 2 diabetes mellitus ([place]) 07/24/2014  ? ? ?PCP: [name], FNP ? ?REFERRING PROVIDER: Vladimir Crofts, MD ? ?REFERRING DIAGNOSIS: R26.89 (ICD-10-CM) - Other abnormalities of gait and mobility ? ?THERAPY DIAG: Unsteadiness on feet ? ?ONSET DATE: 11/26/21 (approximate) ? ?FOLLOW UP APPT WITH PROVIDER: Yes, May 2023 with neurology  ? ?FROM INITIAL EVALUATION ? ?SUBJECTIVE:                                                                                                                                                                                        ? ?  Chief Complaint: Unsteadiness ? ?Pertinent History ?Pt reports difficulty with her balance which started approximately 6 months ago. At the time it started she was feeling "run down" with little energy. Pt reports that "I'm supposed to walk but I don't have the motivation." She reports chronic low back pain if she stands for an extended period of time which also impacts her motivation to stand and walk. She has been evaluated at [facility] for her back pain and was prescribed Naproxen but she never followed-up due to other issues. She complains of leg weakness and states "If I sit too long it's hard for me to get up." She would like to join the gym but is having financial difficulties and cannot afford a membership at the time. Denies dizziness or vertigo. She is being followed by neurology and complains of memory difficulty, noting difficulty with short-term memory, multitasking, decision making, brain fog. Her memory problem started in 2018 and were gradual in nature. She went on disability Sept 25th 2022, due to memory impairment and panic attacks. Has previously been evaluated by neuropsych. Hx of stress incontinence. Pt previously worked as an Therapist, music.  ? ?Pain: Yes, chronic low back pain but not at  the time of evaluation ?Numbness/Tingling: No ?Focal Weakness: Yes, pt reports increased R knee weakness recently ?Recent changes in overall health/medication: Yes, see history ?Prior history of physical therapy for balance:  No ?Falls: Has patient fallen in last 6 months? No, ?Dominant hand: right ?Imaging: Yes, brain MRI microvascular ischemic and metabolic changes (has vascular risk factors of diabetes mellitus, obesity, etc) + whole brain volume in the 5th percentile  ?Prior level of function: Independent ?Occupational demands: Retired Therapist, music, on disability ?Hobbies: working in her garden ?Red flags: urinary incontinence, negative for bowel/bladder changes, saddle paresthesia, personal history of cancer, h/o spinal tumors, h/o compression fx, abdominal pain, chills/fever, night sweats, nausea, vomiting, unrelenting pain; ? ?Precautions: None ? ?Weight Bearing Restrictions: No ? ?Living Environment ?Lives with: lives with their spouse ?Lives in: House/apartment, two levels but pt lives on first level, 5 steps to enter with bilateral wide rails; ?Has following equipment at home: shower chair, tub shower with grab bars; ? ? ?Patient Goals: "I want to get my balance back and feel like I can walk around and do things. I would like to gain strength." ? ? ?OBJECTIVE:  ? ?Patient Surveys  ?FOTO: 43, predicted improvement to 50 ?ABC: 35% ? ? ?Cognition ?Patient is oriented to person, place, and time.  ?Pt reports difficulty with recent memory but no memory loss observed during intake; ?Remote memory is intact ?Attention span and concentration are intact during session however pt reports difficulty with concentration.  ?Expressive speech is intact.  ?Patient's fund of knowledge is within normal limits for educational level. ?   ? ?Gross Musculoskeletal Assessment ?Tremor: None (pt reports R hand tremor but not observed during evaluation); ?Bulk: Normal ?Tone: Normal ? ? ?GAIT: ?Distance walked: >200' over the course of  the entire evaluation ?Assistive device utilized: None ?Level of assistance: Complete Independence ?Comments: Pt ambulates with wide base of support and decreased floor to toe clearance with some mild foot slap LLE. Forward head and rounded shoulders with forward trunk flexion during gait.  ? ? ?Posture: ?Forward head and rounded shoulders ? ? ?AROM: Deferred full testing but no gross deficits observed during functional activity; ? ? ?LE MMT: ? ?MMT (out of 5) Right ?06/06/2021 Left ?06/06/2021  ?Hip flexion 4 4  ?Hip extension    ?  Hip abduction (seated) 5 5  ?Hip adduction (seated) 5 5  ?Hip internal rotation 5 5  ?Hip external rotation 5 5  ?Knee flexion (seated) 5 5  ?Knee extension  5 5  ?Ankle dorsiflexion 5 5  ?Ankle plantarflexion Active Active  ?Ankle inversion    ?Ankle eversion    ?(* = pain; Blank rows = not tested) ? ? ?Sensation ?Grossly intact to light touch bilateral LEs as determined by testing dermatomes L2-S2. Proprioception, and hot/cold testing deferred on this date. ? ? ?Reflexes ?Deferred ? ? ?Cranial Nerves ?Visual acuity and visual fields are intact (eye exam next Monday, no recent Rx changes);  ?Extraocular muscles are intact  ?Facial sensation is intact bilaterally  ?Facial strength is intact bilaterally  ?Hearing is normal as tested by gross conversation ?Palate elevates midline, normal phonation  ?Shoulder shrug strength is intact  ?Tongue protrudes midline ? ? ?Coordination/Cerebellar ?Finger to Nose: WNL ?Heel to Shin: WNL ?Rapid alternating movements: WNL ?Finger Opposition: WNL ?Pronator Drift: Negative ? ? ?FUNCTIONAL OUTCOME MEASURES ? ? Results Comments  ?BERG 51/56 Mild deficits, in need of intervention  ?DGI 23/24 WNL  ?30s Sit to Stand 13 repetitions WNL (normative age/gender)  ?TUG 10.8 seconds WNL  ?5TSTS 12.4 seconds WNL  ?6 Minute Walk Test    ?10 Meter Gait Speed Self-selected: 10.2 s = 0.98 m/s; Fastest: 8.3s = 1.2 m/s Self-selected slightly below community ambulation speed   ?(Blank rows = not tested) ? ?mCTSIB: Conditions 1-3: 30s, Condition 4: 5s; ? ? ?TODAY'S TREATMENT  ? ?SUBJECTIVE: Pt reports that she is doing well today. She saw her PCP this morning regarding her back p

## 2021-06-06 NOTE — Assessment & Plan Note (Signed)
Chronic, unstable ?Denies SI or HI ?Denies self harm intent ?Exacerbated d/t acute illness- acute on chronic back pain and diarrhea x4 wks ?Has counselor ?Continues medication without complaint- 300 mg wellbutrin xl; prozac 60 mg; zyprexa 7.5 mg qHS ? ?

## 2021-06-07 ENCOUNTER — Telehealth: Payer: Self-pay

## 2021-06-07 NOTE — Telephone Encounter (Signed)
Dr. Valora Corporal calling to request a doctor to doctor review for disability in regard to pt. Contact # [phone]. ?

## 2021-06-09 DIAGNOSIS — I152 Hypertension secondary to endocrine disorders: Secondary | ICD-10-CM

## 2021-06-09 DIAGNOSIS — E1129 Type 2 diabetes mellitus with other diabetic kidney complication: Secondary | ICD-10-CM

## 2021-06-09 DIAGNOSIS — E1159 Type 2 diabetes mellitus with other circulatory complications: Secondary | ICD-10-CM

## 2021-06-09 DIAGNOSIS — R809 Proteinuria, unspecified: Secondary | ICD-10-CM

## 2021-06-10 ENCOUNTER — Encounter: Payer: Medicare Other | Admitting: Speech Pathology

## 2021-06-11 ENCOUNTER — Telehealth: Payer: Self-pay | Admitting: Family Medicine

## 2021-06-11 ENCOUNTER — Ambulatory Visit: Payer: Medicare Other | Attending: Neurology

## 2021-06-11 DIAGNOSIS — M545 Low back pain, unspecified: Secondary | ICD-10-CM | POA: Insufficient documentation

## 2021-06-11 DIAGNOSIS — E1129 Type 2 diabetes mellitus with other diabetic kidney complication: Secondary | ICD-10-CM

## 2021-06-11 DIAGNOSIS — R4189 Other symptoms and signs involving cognitive functions and awareness: Secondary | ICD-10-CM | POA: Diagnosis not present

## 2021-06-11 DIAGNOSIS — R41841 Cognitive communication deficit: Secondary | ICD-10-CM | POA: Diagnosis not present

## 2021-06-11 DIAGNOSIS — G8929 Other chronic pain: Secondary | ICD-10-CM | POA: Diagnosis not present

## 2021-06-11 DIAGNOSIS — R2681 Unsteadiness on feet: Secondary | ICD-10-CM | POA: Insufficient documentation

## 2021-06-11 DIAGNOSIS — R413 Other amnesia: Secondary | ICD-10-CM | POA: Diagnosis not present

## 2021-06-11 DIAGNOSIS — M5459 Other low back pain: Secondary | ICD-10-CM | POA: Insufficient documentation

## 2021-06-11 MED ORDER — GLIPIZIDE ER 10 MG PO TB24: ORAL_TABLET | ORAL | 1 refills | Status: DC

## 2021-06-11 NOTE — Therapy (Signed)
?OUTPATIENT PHYSICAL THERAPY BALANCE TREATMENT ? ? ?Patient Name: Melissa Arroyo ?MRN: [id number] ?DOB:1950-09-16, 71 y.o., female1 ?Today's Date: 06/11/2021 ? ? PT End of Session - 06/11/21 1309   ? ? Visit Number 5   ? Number of Visits 25   ? Date for PT Re-Evaluation 08/19/21   ? Authorization Type eval: 05/27/21   ? PT Start Time 1315   ? PT Stop Time 1400   ? PT Time Calculation (min) 45 min   ? Equipment Utilized During Treatment Gait belt   ? Activity Tolerance Patient tolerated treatment well   ? Behavior During Therapy [facility] for tasks assessed/performed   ? ?  ?  ? ?  ? ? ? ?Past Medical History:  ?Diagnosis Date  ? Anxiety   ? Depression   ? Diabetes mellitus ([place])   ? Dyspnea   ? Heart attack ([place]) 04/11/2007  ? Heart attack ([place])   ? Hyperlipidemia   ? Hypertension   ? Obesity   ? Recurrent UTI   ? Renal lesion   ? Urinary incontinence in female   ? Vaginal atrophy   ? ?Past Surgical History:  ?Procedure Laterality Date  ? BREAST BIOPSY Right 03/29/2021  ? stereo bx-calcs, "RIBBON" clip-path pending  ? BUNIONECTOMY  2001  ? CARDIAC CATHETERIZATION    ? COLONOSCOPY WITH PROPOFOL N/A 11/27/2020  ? Procedure: COLONOSCOPY WITH PROPOFOL;  Surgeon: Virgel Manifold, MD;  Location: [facility] ENDOSCOPY;  Service: Endoscopy;  Laterality: N/A;  ? DENTAL SURGERY    ? NECK SURGERY    ? ROTATOR CUFF REPAIR Left   ? ?Patient Active Problem List  ? Diagnosis Date Noted  ? Type 2 diabetes mellitus with microalbuminuria, without long-term current use of insulin ([place]) 06/06/2021  ? Depression, recurrent ([place]) 06/06/2021  ? Cognitive impairment 04/24/2021  ? Memory deficits 03/27/2021  ? Panic attack due to post traumatic stress disorder (PTSD) 03/27/2021  ? Thickened nails 02/19/2021  ? Nail fungus 02/19/2021  ? Medicare annual wellness visit, subsequent 02/19/2021  ? Abnormal mammogram 02/19/2021  ? Acquired hypothyroidism 02/19/2021  ? Need for shingles vaccine 02/19/2021  ? Cough due to bronchospasm 02/19/2021  ?  Hypertension associated with diabetes ([place]) 02/19/2021  ? Chronic bilateral low back pain without sciatica 11/09/2020  ? Hyperlipidemia associated with type 2 diabetes mellitus ([place]) 07/24/2014  ? ? ?PCP: [name], FNP ? ?REFERRING PROVIDER: Vladimir Crofts, MD ? ?REFERRING DIAGNOSIS: R26.89 (ICD-10-CM) - Other abnormalities of gait and mobility ? ?THERAPY DIAG: Unsteadiness on feet ? ?ONSET DATE: 11/26/21 (approximate) ? ?FOLLOW UP APPT WITH PROVIDER: Yes, May 2023 with neurology  ? ?FROM INITIAL EVALUATION ? ?SUBJECTIVE:                                                                                                                                                                                        ? ?  Chief Complaint: Unsteadiness ? ?Pertinent History ?Pt reports difficulty with her balance which started approximately 6 months ago. At the time it started she was feeling "run down" with little energy. Pt reports that "I'm supposed to walk but I don't have the motivation." She reports chronic low back pain if she stands for an extended period of time which also impacts her motivation to stand and walk. She has been evaluated at [facility] for her back pain and was prescribed Naproxen but she never followed-up due to other issues. She complains of leg weakness and states "If I sit too long it's hard for me to get up." She would like to join the gym but is having financial difficulties and cannot afford a membership at the time. Denies dizziness or vertigo. She is being followed by neurology and complains of memory difficulty, noting difficulty with short-term memory, multitasking, decision making, brain fog. Her memory problem started in 2018 and were gradual in nature. She went on disability Sept 25th 2022, due to memory impairment and panic attacks. Has previously been evaluated by neuropsych. Hx of stress incontinence. Pt previously worked as an Therapist, music.  ? ?Pain: Yes, chronic low back pain but not at  the time of evaluation ?Numbness/Tingling: No ?Focal Weakness: Yes, pt reports increased R knee weakness recently ?Recent changes in overall health/medication: Yes, see history ?Prior history of physical therapy for balance:  No ?Falls: Has patient fallen in last 6 months? No, ?Dominant hand: right ?Imaging: Yes, brain MRI microvascular ischemic and metabolic changes (has vascular risk factors of diabetes mellitus, obesity, etc) + whole brain volume in the 5th percentile  ?Prior level of function: Independent ?Occupational demands: Retired Therapist, music, on disability ?Hobbies: working in her garden ?Red flags: urinary incontinence, negative for bowel/bladder changes, saddle paresthesia, personal history of cancer, h/o spinal tumors, h/o compression fx, abdominal pain, chills/fever, night sweats, nausea, vomiting, unrelenting pain; ? ?Precautions: None ? ?Weight Bearing Restrictions: No ? ?Living Environment ?Lives with: lives with their spouse ?Lives in: House/apartment, two levels but pt lives on first level, 5 steps to enter with bilateral wide rails; ?Has following equipment at home: shower chair, tub shower with grab bars; ? ? ?Patient Goals: "I want to get my balance back and feel like I can walk around and do things. I would like to gain strength." ? ? ?OBJECTIVE:  ? ?Patient Surveys  ?FOTO: 43, predicted improvement to 50 ?ABC: 35% ? ? ?Cognition ?Patient is oriented to person, place, and time.  ?Pt reports difficulty with recent memory but no memory loss observed during intake; ?Remote memory is intact ?Attention span and concentration are intact during session however pt reports difficulty with concentration.  ?Expressive speech is intact.  ?Patient's fund of knowledge is within normal limits for educational level. ?   ? ?Gross Musculoskeletal Assessment ?Tremor: None (pt reports R hand tremor but not observed during evaluation); ?Bulk: Normal ?Tone: Normal ? ? ?GAIT: ?Distance walked: >200' over the course of  the entire evaluation ?Assistive device utilized: None ?Level of assistance: Complete Independence ?Comments: Pt ambulates with wide base of support and decreased floor to toe clearance with some mild foot slap LLE. Forward head and rounded shoulders with forward trunk flexion during gait.  ? ? ?Posture: ?Forward head and rounded shoulders ? ? ?AROM: Deferred full testing but no gross deficits observed during functional activity; ? ? ?LE MMT: ? ?MMT (out of 5) Right ?06/11/2021 Left ?06/11/2021  ?Hip flexion 4 4  ?Hip extension    ?  Hip abduction (seated) 5 5  ?Hip adduction (seated) 5 5  ?Hip internal rotation 5 5  ?Hip external rotation 5 5  ?Knee flexion (seated) 5 5  ?Knee extension  5 5  ?Ankle dorsiflexion 5 5  ?Ankle plantarflexion Active Active  ?Ankle inversion    ?Ankle eversion    ?(* = pain; Blank rows = not tested) ? ? ?Sensation ?Grossly intact to light touch bilateral LEs as determined by testing dermatomes L2-S2. Proprioception, and hot/cold testing deferred on this date. ? ? ?Reflexes ?Deferred ? ? ?Cranial Nerves ?Visual acuity and visual fields are intact (eye exam next Monday, no recent Rx changes);  ?Extraocular muscles are intact  ?Facial sensation is intact bilaterally  ?Facial strength is intact bilaterally  ?Hearing is normal as tested by gross conversation ?Palate elevates midline, normal phonation  ?Shoulder shrug strength is intact  ?Tongue protrudes midline ? ? ?Coordination/Cerebellar ?Finger to Nose: WNL ?Heel to Shin: WNL ?Rapid alternating movements: WNL ?Finger Opposition: WNL ?Pronator Drift: Negative ? ? ?FUNCTIONAL OUTCOME MEASURES ? ? Results Comments  ?BERG 51/56 Mild deficits, in need of intervention  ?DGI 23/24 WNL  ?30s Sit to Stand 13 repetitions WNL (normative age/gender)  ?TUG 10.8 seconds WNL  ?5TSTS 12.4 seconds WNL  ?6 Minute Walk Test    ?10 Meter Gait Speed Self-selected: 10.2 s = 0.98 m/s; Fastest: 8.3s = 1.2 m/s Self-selected slightly below community ambulation speed   ?(Blank rows = not tested) ? ?mCTSIB: Conditions 1-3: 30s, Condition 4: 5s; ? ? ?TODAY'S TREATMENT  ? ?SUBJECTIVE: Pt reports that she is doing well today. Denies falls or stumbles since last therapy session. No

## 2021-06-11 NOTE — Telephone Encounter (Signed)
[facility] faxed refill request for the following medications: ? ? ?glipiZIDE (GLUCOTROL XL) 10 MG 24 hr tablet  ? ?Please advise. ? ?

## 2021-06-12 ENCOUNTER — Ambulatory Visit: Payer: Medicare Other | Admitting: Speech Pathology

## 2021-06-12 ENCOUNTER — Encounter: Payer: Self-pay | Admitting: Speech Pathology

## 2021-06-12 DIAGNOSIS — R41841 Cognitive communication deficit: Secondary | ICD-10-CM

## 2021-06-12 DIAGNOSIS — M545 Low back pain, unspecified: Secondary | ICD-10-CM | POA: Diagnosis not present

## 2021-06-12 DIAGNOSIS — R2681 Unsteadiness on feet: Secondary | ICD-10-CM | POA: Diagnosis not present

## 2021-06-12 DIAGNOSIS — M5459 Other low back pain: Secondary | ICD-10-CM | POA: Diagnosis not present

## 2021-06-12 DIAGNOSIS — R4189 Other symptoms and signs involving cognitive functions and awareness: Secondary | ICD-10-CM | POA: Diagnosis not present

## 2021-06-12 DIAGNOSIS — R413 Other amnesia: Secondary | ICD-10-CM | POA: Diagnosis not present

## 2021-06-12 NOTE — Addendum Note (Signed)
Addended by: Dalbert Batman on: 06/12/2021 05:58 PM ? ? Modules accepted: Orders ? ?

## 2021-06-12 NOTE — Therapy (Signed)
[facility] ?[place], [place], [address] ?Phone: [phone]   Fax:  [phone] ? ?Speech Language Pathology Evaluation ? ?Patient Details  ?Name: Melissa Arroyo ?MRN: [id number] ?Date of Birth: 05/19/1950 ?Referring Provider (SLP): Hemang K Manuella Ghazi ? ? ?Encounter Date: 06/12/2021 ? ? End of Session - 06/12/21 1646   ? ? Visit Number 1   ? Number of Visits 24   ? Date for SLP Re-Evaluation 09/04/21   ? Authorization Type Medicare A/B   ? Authorization Time Period 06/12/2021-09/04/2021   ? Authorization - Visit Number 1   ? Authorization - Number of Visits 24   ? Progress Note Due on Visit 10   ? SLP Start Time 1400   ? SLP Stop Time  1500   ? SLP Time Calculation (min) 60 min   ? Activity Tolerance Patient tolerated treatment well   ? ?  ?  ? ?  ? ? ?Past Medical History:  ?Diagnosis Date  ? Anxiety   ? Depression   ? Diabetes mellitus ([place])   ? Dyspnea   ? Heart attack ([place]) 04/11/2007  ? Heart attack ([place])   ? Hyperlipidemia   ? Hypertension   ? Obesity   ? Recurrent UTI   ? Renal lesion   ? Urinary incontinence in female   ? Vaginal atrophy   ? ? ?Past Surgical History:  ?Procedure Laterality Date  ? BREAST BIOPSY Right 03/29/2021  ? stereo bx-calcs, "RIBBON" clip-path pending  ? BUNIONECTOMY  2001  ? CARDIAC CATHETERIZATION    ? COLONOSCOPY WITH PROPOFOL N/A 11/27/2020  ? Procedure: COLONOSCOPY WITH PROPOFOL;  Surgeon: Virgel Manifold, MD;  Location: [facility] ENDOSCOPY;  Service: Endoscopy;  Laterality: N/A;  ? DENTAL SURGERY    ? NECK SURGERY    ? ROTATOR CUFF REPAIR Left   ? ? ?There were no vitals filed for this visit. ? ? Subjective Assessment - 06/12/21 1632   ? ? Subjective Patient arrived independently, eager and greatful for initial session   ? Currently in Pain? No/denies   ? ?  ?  ? ?  ? ? ? ? ? SLP Evaluation [facility] - 06/12/21 1632   ? ?  ? SLP Visit Information  ? SLP Received On 06/12/21   ? Referring Provider (SLP) Maudry Diego Candyce Churn   ?  Onset Date "years ago" Epic reported 2018 per Md notes but notably worsened in 2022   ? Medical Diagnosis not yet determined   ?  ? Subjective  ? Patient/Family Stated Goal Get better   ?  ? General Information  ? HPI Per notes with MD Manuella Ghazi, "her memory problem started in 2018 and were gradual in natue. She went on disability in September 2022 due to memory impairment and panic attacks. Plan to be seen by neuropsych" for formal cognitive testing   ? Behavioral/Cognition Patient endorsed inc   ? Mobility Status Ambulatory   ?  ? Prior Functional Status  ? Cognitive/Linguistic Baseline Within functional limits   ?  Lives With Spouse   ? Vocation On disability   temporarily "short term"; occupation: optician  ?  ? Cognition  ? Overall Cognitive Status Within Functional Limits for tasks assessed   ? Attention Alternating   ? Alternating Attention Appears intact   ? Memory Impaired   ? Memory Impairment Retrieval deficit;Decreased recall of new information;Decreased short term memory   ? Decreased Short Term Memory Verbal complex;Functional complex   ?  Awareness Appears intact   ? Problem Solving Appears intact   ? Executive Function Reasoning;Sequencing;Organizing;Initiating;Self Monitoring   ? Reasoning Appears intact   ? Sequencing Appears intact   ? Organizing Appears intact   ? Initiating Appears intact   ? Self Monitoring Appears intact   ? Behaviors Other (comment)   anxious re: errors; patient reported her recall is worse when she is being pressed for information quickly or if pressured to not utilize her notes  ?  ? Auditory Comprehension  ? Overall Auditory Comprehension Appears within functional limits for tasks assessed   ?  ? Visual Recognition/Discrimination  ? Discrimination Within Function Limits   ?  ? Reading Comprehension  ? Reading Status Not tested   ?  ? Expression  ? Primary Mode of Expression Verbal   ?  ? Verbal Expression  ? Overall Verbal Expression Appears within functional limits for tasks  assessed   ?  ? Written Expression  ? Dominant Hand Right   ?  ? Standardized Assessments  ? Standardized Assessments  Cognitive Linguistic Quick Test   ?  ? Cognitive Linguistic Quick Test (Ages 18-69)  ? Attention WNL (193/215)  ? Memory Moderate (111/185)  ? Executive Function WNL [phone])    ? Language WNL (30/37)  ? Visuospatial Skills WNL  (97/105)  ? Severity Rating Total 18   ? Composite Severity Rating 3.6   ? ?  ?  ? ?  ? ? ? ? ? ? ? ? ? ? ? ? ? ? ? ? ? ? SLP Education - 06/12/21 1646   ? ? Education Details exam, purppose of exam, results and recommendations   ? Person(s) Educated Patient   ? Methods Explanation;Demonstration   ? Comprehension Verbalized understanding;Need further instruction   ? ?  ?  ? ?  ? ? ? ? ? ? ? Plan - 06/12/21 1649   ? ? Clinical Impression Statement Patient completed the standardized cognitive linguistic quick test (CLQT) which assesses cognitive communication function. Pt presents with overall functional cognitive communication per WNL scores for attention, executive function, language, and visuospatial skills however demonstrated moderate impairment in memory with score atypical of that for her age range. These deficis may impact indepedent function ing in the home and work environments. Given testing with scores indicative of memory impairment as well as patient endorsement of memory deficits with major impact on daily living (patient reported unable to complete work tasks accurately and efficiently as needed by her employer), patient would benefit from SLP intervention with focus on improving memory and training/implementing memory strategies to increase potential to return to work.   ? Speech Therapy Frequency 1x /week   ? Duration 12 weeks   ? Treatment/Interventions Cueing hierarchy;Functional tasks;Patient/family education;Compensatory strategies;Internal/external aids;SLP instruction and feedback   ? Potential to Achieve Goals Good   ? Potential Considerations Previous  level of function;Severity of impairments;Cooperation/participation level   ? Consulted and Agree with Plan of Care Patient   ? ?  ?  ? ?  ? ? ?Patient will benefit from skilled therapeutic intervention in order to improve the following deficits and impairments:   ?Cognitive communication deficit ? ? ? ?Problem List ?Patient Active Problem List  ? Diagnosis Date Noted  ? Type 2 diabetes mellitus with microalbuminuria, without long-term current use of insulin ([place]) 06/06/2021  ? Depression, recurrent ([place]) 06/06/2021  ? Cognitive impairment 04/24/2021  ? Memory deficits 03/27/2021  ? Panic attack due to post traumatic stress disorder (  PTSD) 03/27/2021  ? Thickened nails 02/19/2021  ? Nail fungus 02/19/2021  ? Medicare annual wellness visit, subsequent 02/19/2021  ? Abnormal mammogram 02/19/2021  ? Acquired hypothyroidism 02/19/2021  ? Need for shingles vaccine 02/19/2021  ? Cough due to bronchospasm 02/19/2021  ? Hypertension associated with diabetes ([place]) 02/19/2021  ? Chronic bilateral low back pain without sciatica 11/09/2020  ? Hyperlipidemia associated with type 2 diabetes mellitus ([place]) 07/24/2014  ? ?[place] L. Ibraheem Voris, M.A. CCC-SLP ?Adult-based Speech Language Pathologist ?[place] ?[phone] ? ?Dalbert Batman, CCC-SLP ?06/12/2021, 5:00 PM ? ?[facility] ?[place], [place], [address] ?Phone: [phone]   Fax:  [phone] ? ?Name: DEJANEE THIBEAUX ?MRN: [id number] ?Date of Birth: 12/08/1950 ?

## 2021-06-13 ENCOUNTER — Ambulatory Visit: Payer: Medicare Other

## 2021-06-13 DIAGNOSIS — R4189 Other symptoms and signs involving cognitive functions and awareness: Secondary | ICD-10-CM | POA: Diagnosis not present

## 2021-06-13 DIAGNOSIS — R2681 Unsteadiness on feet: Secondary | ICD-10-CM | POA: Diagnosis not present

## 2021-06-13 DIAGNOSIS — M5459 Other low back pain: Secondary | ICD-10-CM | POA: Diagnosis not present

## 2021-06-13 DIAGNOSIS — R413 Other amnesia: Secondary | ICD-10-CM | POA: Diagnosis not present

## 2021-06-13 DIAGNOSIS — M545 Low back pain, unspecified: Secondary | ICD-10-CM | POA: Diagnosis not present

## 2021-06-13 DIAGNOSIS — R41841 Cognitive communication deficit: Secondary | ICD-10-CM | POA: Diagnosis not present

## 2021-06-13 NOTE — Therapy (Signed)
?OUTPATIENT PHYSICAL THERAPY BALANCE TREATMENT ? ? ?Patient Name: Melissa Arroyo ?MRN: [id number] ?DOB:1950/10/28, 71 y.o., female1 ?Today's Date: 06/13/2021 ? ? PT End of Session - 06/13/21 1320   ? ? Visit Number 6   ? Number of Visits 25   ? Date for PT Re-Evaluation 08/19/21   ? Authorization Type eval: 05/27/21   ? PT Start Time 1315   ? PT Stop Time 1400   ? PT Time Calculation (min) 45 min   ? Equipment Utilized During Treatment Gait belt   ? Activity Tolerance Patient tolerated treatment well   ? Behavior During Therapy [facility] for tasks assessed/performed   ? ?  ?  ? ?  ? ? ? ?Past Medical History:  ?Diagnosis Date  ? Anxiety   ? Depression   ? Diabetes mellitus ([place])   ? Dyspnea   ? Heart attack ([place]) 04/11/2007  ? Heart attack ([place])   ? Hyperlipidemia   ? Hypertension   ? Obesity   ? Recurrent UTI   ? Renal lesion   ? Urinary incontinence in female   ? Vaginal atrophy   ? ?Past Surgical History:  ?Procedure Laterality Date  ? BREAST BIOPSY Right 03/29/2021  ? stereo bx-calcs, "RIBBON" clip-path pending  ? BUNIONECTOMY  2001  ? CARDIAC CATHETERIZATION    ? COLONOSCOPY WITH PROPOFOL N/A 11/27/2020  ? Procedure: COLONOSCOPY WITH PROPOFOL;  Surgeon: Virgel Manifold, MD;  Location: [facility] ENDOSCOPY;  Service: Endoscopy;  Laterality: N/A;  ? DENTAL SURGERY    ? NECK SURGERY    ? ROTATOR CUFF REPAIR Left   ? ?Patient Active Problem List  ? Diagnosis Date Noted  ? Type 2 diabetes mellitus with microalbuminuria, without long-term current use of insulin ([place]) 06/06/2021  ? Depression, recurrent ([place]) 06/06/2021  ? Cognitive impairment 04/24/2021  ? Memory deficits 03/27/2021  ? Panic attack due to post traumatic stress disorder (PTSD) 03/27/2021  ? Thickened nails 02/19/2021  ? Nail fungus 02/19/2021  ? Medicare annual wellness visit, subsequent 02/19/2021  ? Abnormal mammogram 02/19/2021  ? Acquired hypothyroidism 02/19/2021  ? Need for shingles vaccine 02/19/2021  ? Cough due to bronchospasm 02/19/2021  ?  Hypertension associated with diabetes ([place]) 02/19/2021  ? Chronic bilateral low back pain without sciatica 11/09/2020  ? Hyperlipidemia associated with type 2 diabetes mellitus ([place]) 07/24/2014  ? ? ?PCP: [name], FNP ? ?REFERRING PROVIDER: Vladimir Crofts, MD ? ?REFERRING DIAGNOSIS: R26.89 (ICD-10-CM) - Other abnormalities of gait and mobility ? ?THERAPY DIAG: Unsteadiness on feet ? ?ONSET DATE: 11/26/21 (approximate) ? ?FOLLOW UP APPT WITH PROVIDER: Yes, May 2023 with neurology  ? ?FROM INITIAL EVALUATION ? ?SUBJECTIVE:                                                                                                                                                                                        ? ?  Chief Complaint: Unsteadiness ? ?Pertinent History ?Pt reports difficulty with her balance which started approximately 6 months ago. At the time it started she was feeling "run down" with little energy. Pt reports that "I'm supposed to walk but I don't have the motivation." She reports chronic low back pain if she stands for an extended period of time which also impacts her motivation to stand and walk. She has been evaluated at [facility] for her back pain and was prescribed Naproxen but she never followed-up due to other issues. She complains of leg weakness and states "If I sit too long it's hard for me to get up." She would like to join the gym but is having financial difficulties and cannot afford a membership at the time. Denies dizziness or vertigo. She is being followed by neurology and complains of memory difficulty, noting difficulty with short-term memory, multitasking, decision making, brain fog. Her memory problem started in 2018 and were gradual in nature. She went on disability Sept 25th 2022, due to memory impairment and panic attacks. Has previously been evaluated by neuropsych. Hx of stress incontinence. Pt previously worked as an Therapist, music.  ? ?Pain: Yes, chronic low back pain but not at  the time of evaluation ?Numbness/Tingling: No ?Focal Weakness: Yes, pt reports increased R knee weakness recently ?Recent changes in overall health/medication: Yes, see history ?Prior history of physical therapy for balance:  No ?Falls: Has patient fallen in last 6 months? No, ?Dominant hand: right ?Imaging: Yes, brain MRI microvascular ischemic and metabolic changes (has vascular risk factors of diabetes mellitus, obesity, etc) + whole brain volume in the 5th percentile  ?Prior level of function: Independent ?Occupational demands: Retired Therapist, music, on disability ?Hobbies: working in her garden ?Red flags: urinary incontinence, negative for bowel/bladder changes, saddle paresthesia, personal history of cancer, h/o spinal tumors, h/o compression fx, abdominal pain, chills/fever, night sweats, nausea, vomiting, unrelenting pain; ? ?Precautions: None ? ?Weight Bearing Restrictions: No ? ?Living Environment ?Lives with: lives with their spouse ?Lives in: House/apartment, two levels but pt lives on first level, 5 steps to enter with bilateral wide rails; ?Has following equipment at home: shower chair, tub shower with grab bars; ? ? ?Patient Goals: "I want to get my balance back and feel like I can walk around and do things. I would like to gain strength." ? ? ?OBJECTIVE:  ? ?Patient Surveys  ?FOTO: 43, predicted improvement to 50 ?ABC: 35% ? ? ?Cognition ?Patient is oriented to person, place, and time.  ?Pt reports difficulty with recent memory but no memory loss observed during intake; ?Remote memory is intact ?Attention span and concentration are intact during session however pt reports difficulty with concentration.  ?Expressive speech is intact.  ?Patient's fund of knowledge is within normal limits for educational level. ?   ? ?Gross Musculoskeletal Assessment ?Tremor: None (pt reports R hand tremor but not observed during evaluation); ?Bulk: Normal ?Tone: Normal ? ? ?GAIT: ?Distance walked: >200' over the course of  the entire evaluation ?Assistive device utilized: None ?Level of assistance: Complete Independence ?Comments: Pt ambulates with wide base of support and decreased floor to toe clearance with some mild foot slap LLE. Forward head and rounded shoulders with forward trunk flexion during gait.  ? ? ?Posture: ?Forward head and rounded shoulders ? ? ?AROM: Deferred full testing but no gross deficits observed during functional activity; ? ? ?LE MMT: ? ?MMT (out of 5) Right ?06/13/2021 Left ?06/13/2021  ?Hip flexion 4 4  ?Hip extension    ?  Hip abduction (seated) 5 5  ?Hip adduction (seated) 5 5  ?Hip internal rotation 5 5  ?Hip external rotation 5 5  ?Knee flexion (seated) 5 5  ?Knee extension  5 5  ?Ankle dorsiflexion 5 5  ?Ankle plantarflexion Active Active  ?Ankle inversion    ?Ankle eversion    ?(* = pain; Blank rows = not tested) ? ? ?Sensation ?Grossly intact to light touch bilateral LEs as determined by testing dermatomes L2-S2. Proprioception, and hot/cold testing deferred on this date. ? ? ?Reflexes ?Deferred ? ? ?Cranial Nerves ?Visual acuity and visual fields are intact (eye exam next Monday, no recent Rx changes);  ?Extraocular muscles are intact  ?Facial sensation is intact bilaterally  ?Facial strength is intact bilaterally  ?Hearing is normal as tested by gross conversation ?Palate elevates midline, normal phonation  ?Shoulder shrug strength is intact  ?Tongue protrudes midline ? ? ?Coordination/Cerebellar ?Finger to Nose: WNL ?Heel to Shin: WNL ?Rapid alternating movements: WNL ?Finger Opposition: WNL ?Pronator Drift: Negative ? ? ?FUNCTIONAL OUTCOME MEASURES ? ? Results Comments  ?BERG 51/56 Mild deficits, in need of intervention  ?DGI 23/24 WNL  ?30s Sit to Stand 13 repetitions WNL (normative age/gender)  ?TUG 10.8 seconds WNL  ?5TSTS 12.4 seconds WNL  ?6 Minute Walk Test    ?10 Meter Gait Speed Self-selected: 10.2 s = 0.98 m/s; Fastest: 8.3s = 1.2 m/s Self-selected slightly below community ambulation speed   ?(Blank rows = not tested) ? ?mCTSIB: Conditions 1-3: 30s, Condition 4: 5s; ? ? ?TODAY'S TREATMENT ? ?SUBJECTIVE: Pt reports that she is doing well today. Denies falls or stumbles since last therapy session. No

## 2021-06-14 ENCOUNTER — Ambulatory Visit: Payer: Medicare Other

## 2021-06-14 DIAGNOSIS — E1159 Type 2 diabetes mellitus with other circulatory complications: Secondary | ICD-10-CM

## 2021-06-14 NOTE — Chronic Care Management (AMB) (Signed)
?  Chronic Care Management  ? ?CCM RN Visit Note ? ?06/14/2021 ?Name: Melissa Arroyo MRN: [id number] DOB: 01-16-1951 ? ?Subjective: ?Melissa Arroyo is a 71 y.o. year old female who is a primary care patient of [name], FNP. The care management team was consulted for assistance with disease management and care coordination needs.   ? ?[name] requested to complete updates at a later time. Denies urgent concerns. Will call to confirm availability for outreach. ? ?PLAN: ?Anticipate outreach within the next two weeks. ? ? ?Rebel Willcutt,RN ?[facility] ?[place] ?[phone] ? ? ? ? ? ? ? ? ?

## 2021-06-15 DIAGNOSIS — Z20822 Contact with and (suspected) exposure to covid-19: Secondary | ICD-10-CM | POA: Diagnosis not present

## 2021-06-17 ENCOUNTER — Ambulatory Visit: Payer: Medicare Other | Admitting: Speech Pathology

## 2021-06-17 ENCOUNTER — Encounter: Payer: Medicare Other | Admitting: Speech Pathology

## 2021-06-17 DIAGNOSIS — R4189 Other symptoms and signs involving cognitive functions and awareness: Secondary | ICD-10-CM | POA: Diagnosis not present

## 2021-06-17 DIAGNOSIS — R2681 Unsteadiness on feet: Secondary | ICD-10-CM | POA: Diagnosis not present

## 2021-06-17 DIAGNOSIS — R413 Other amnesia: Secondary | ICD-10-CM | POA: Diagnosis not present

## 2021-06-17 DIAGNOSIS — R41841 Cognitive communication deficit: Secondary | ICD-10-CM | POA: Diagnosis not present

## 2021-06-17 DIAGNOSIS — M5459 Other low back pain: Secondary | ICD-10-CM | POA: Diagnosis not present

## 2021-06-17 DIAGNOSIS — M545 Low back pain, unspecified: Secondary | ICD-10-CM | POA: Diagnosis not present

## 2021-06-17 DIAGNOSIS — Z20822 Contact with and (suspected) exposure to covid-19: Secondary | ICD-10-CM | POA: Diagnosis not present

## 2021-06-18 ENCOUNTER — Ambulatory Visit: Payer: Medicare Other

## 2021-06-18 DIAGNOSIS — R4189 Other symptoms and signs involving cognitive functions and awareness: Secondary | ICD-10-CM | POA: Diagnosis not present

## 2021-06-18 DIAGNOSIS — R413 Other amnesia: Secondary | ICD-10-CM | POA: Diagnosis not present

## 2021-06-18 DIAGNOSIS — M545 Low back pain, unspecified: Secondary | ICD-10-CM | POA: Diagnosis not present

## 2021-06-18 DIAGNOSIS — M5459 Other low back pain: Secondary | ICD-10-CM | POA: Diagnosis not present

## 2021-06-18 DIAGNOSIS — R2681 Unsteadiness on feet: Secondary | ICD-10-CM

## 2021-06-18 DIAGNOSIS — R41841 Cognitive communication deficit: Secondary | ICD-10-CM | POA: Diagnosis not present

## 2021-06-18 NOTE — Therapy (Signed)
?OUTPATIENT PHYSICAL THERAPY BALANCE TREATMENT ? ? ?Patient Name: Melissa Arroyo ?MRN: [id number] ?DOB:1950-08-28, 71 y.o., female ?Today's Date: 06/18/2021 ? ? PT End of Session - 06/18/21 1314   ? ? Visit Number 7   ? Number of Visits 25   ? Date for PT Re-Evaluation 08/19/21   ? Authorization Type eval: 05/27/21   ? PT Start Time 1315   ? PT Stop Time 1400   ? PT Time Calculation (min) 45 min   ? Equipment Utilized During Treatment Gait belt   ? Activity Tolerance Patient tolerated treatment well   ? Behavior During Therapy [facility] for tasks assessed/performed   ? ?  ?  ? ?  ? ? ? ? ?Past Medical History:  ?Diagnosis Date  ? Anxiety   ? Depression   ? Diabetes mellitus ([place])   ? Dyspnea   ? Heart attack ([place]) 04/11/2007  ? Heart attack ([place])   ? Hyperlipidemia   ? Hypertension   ? Obesity   ? Recurrent UTI   ? Renal lesion   ? Urinary incontinence in female   ? Vaginal atrophy   ? ?Past Surgical History:  ?Procedure Laterality Date  ? BREAST BIOPSY Right 03/29/2021  ? stereo bx-calcs, "RIBBON" clip-path pending  ? BUNIONECTOMY  2001  ? CARDIAC CATHETERIZATION    ? COLONOSCOPY WITH PROPOFOL N/A 11/27/2020  ? Procedure: COLONOSCOPY WITH PROPOFOL;  Surgeon: Virgel Manifold, MD;  Location: [facility] ENDOSCOPY;  Service: Endoscopy;  Laterality: N/A;  ? DENTAL SURGERY    ? NECK SURGERY    ? ROTATOR CUFF REPAIR Left   ? ?Patient Active Problem List  ? Diagnosis Date Noted  ? Type 2 diabetes mellitus with microalbuminuria, without long-term current use of insulin ([place]) 06/06/2021  ? Depression, recurrent ([place]) 06/06/2021  ? Cognitive impairment 04/24/2021  ? Memory deficits 03/27/2021  ? Panic attack due to post traumatic stress disorder (PTSD) 03/27/2021  ? Thickened nails 02/19/2021  ? Nail fungus 02/19/2021  ? Medicare annual wellness visit, subsequent 02/19/2021  ? Abnormal mammogram 02/19/2021  ? Acquired hypothyroidism 02/19/2021  ? Need for shingles vaccine 02/19/2021  ? Cough due to bronchospasm 02/19/2021  ?  Hypertension associated with diabetes ([place]) 02/19/2021  ? Chronic bilateral low back pain without sciatica 11/09/2020  ? Hyperlipidemia associated with type 2 diabetes mellitus ([place]) 07/24/2014  ? ? ?PCP: [name], FNP ? ?REFERRING PROVIDER: Vladimir Crofts, MD ? ?REFERRING DIAGNOSIS: R26.89 (ICD-10-CM) - Other abnormalities of gait and mobility ? ?THERAPY DIAG: Unsteadiness on feet ? ?ONSET DATE: 11/26/21 (approximate) ? ?FOLLOW UP APPT WITH PROVIDER: Yes, May 2023 with neurology  ? ?FROM INITIAL EVALUATION ? ?SUBJECTIVE:                                                                                                                                                                                        ? ?  Chief Complaint: Unsteadiness ? ?Pertinent History ?Pt reports difficulty with her balance which started approximately 6 months ago. At the time it started she was feeling "run down" with little energy. Pt reports that "I'm supposed to walk but I don't have the motivation." She reports chronic low back pain if she stands for an extended period of time which also impacts her motivation to stand and walk. She has been evaluated at [facility] for her back pain and was prescribed Naproxen but she never followed-up due to other issues. She complains of leg weakness and states "If I sit too long it's hard for me to get up." She would like to join the gym but is having financial difficulties and cannot afford a membership at the time. Denies dizziness or vertigo. She is being followed by neurology and complains of memory difficulty, noting difficulty with short-term memory, multitasking, decision making, brain fog. Her memory problem started in 2018 and were gradual in nature. She went on disability Sept 25th 2022, due to memory impairment and panic attacks. Has previously been evaluated by neuropsych. Hx of stress incontinence. Pt previously worked as an Therapist, music.  ? ?Pain: Yes, chronic low back pain but not at  the time of evaluation ?Numbness/Tingling: No ?Focal Weakness: Yes, pt reports increased R knee weakness recently ?Recent changes in overall health/medication: Yes, see history ?Prior history of physical therapy for balance:  No ?Falls: Has patient fallen in last 6 months? No, ?Dominant hand: right ?Imaging: Yes, brain MRI microvascular ischemic and metabolic changes (has vascular risk factors of diabetes mellitus, obesity, etc) + whole brain volume in the 5th percentile  ?Prior level of function: Independent ?Occupational demands: Retired Therapist, music, on disability ?Hobbies: working in her garden ?Red flags: urinary incontinence, negative for bowel/bladder changes, saddle paresthesia, personal history of cancer, h/o spinal tumors, h/o compression fx, abdominal pain, chills/fever, night sweats, nausea, vomiting, unrelenting pain; ? ?Precautions: None ? ?Weight Bearing Restrictions: No ? ?Living Environment ?Lives with: lives with their spouse ?Lives in: House/apartment, two levels but pt lives on first level, 5 steps to enter with bilateral wide rails; ?Has following equipment at home: shower chair, tub shower with grab bars; ? ? ?Patient Goals: "I want to get my balance back and feel like I can walk around and do things. I would like to gain strength." ? ? ?OBJECTIVE:  ? ?Patient Surveys  ?FOTO: 43, predicted improvement to 50 ?ABC: 35% ? ? ?Cognition ?Patient is oriented to person, place, and time.  ?Pt reports difficulty with recent memory but no memory loss observed during intake; ?Remote memory is intact ?Attention span and concentration are intact during session however pt reports difficulty with concentration.  ?Expressive speech is intact.  ?Patient's fund of knowledge is within normal limits for educational level. ?   ? ?Gross Musculoskeletal Assessment ?Tremor: None (pt reports R hand tremor but not observed during evaluation); ?Bulk: Normal ?Tone: Normal ? ? ?GAIT: ?Distance walked: >200' over the course of  the entire evaluation ?Assistive device utilized: None ?Level of assistance: Complete Independence ?Comments: Pt ambulates with wide base of support and decreased floor to toe clearance with some mild foot slap LLE. Forward head and rounded shoulders with forward trunk flexion during gait.  ? ? ?Posture: ?Forward head and rounded shoulders ? ? ?AROM: Deferred full testing but no gross deficits observed during functional activity; ? ? ?LE MMT: ? ?MMT (out of 5) Right ?06/18/2021 Left ?06/18/2021  ?Hip flexion 4 4  ?Hip extension    ?  Hip abduction (seated) 5 5  ?Hip adduction (seated) 5 5  ?Hip internal rotation 5 5  ?Hip external rotation 5 5  ?Knee flexion (seated) 5 5  ?Knee extension  5 5  ?Ankle dorsiflexion 5 5  ?Ankle plantarflexion Active Active  ?Ankle inversion    ?Ankle eversion    ?(* = pain; Blank rows = not tested) ? ? ?Sensation ?Grossly intact to light touch bilateral LEs as determined by testing dermatomes L2-S2. Proprioception, and hot/cold testing deferred on this date. ? ? ?Reflexes ?Deferred ? ? ?Cranial Nerves ?Visual acuity and visual fields are intact (eye exam next Monday, no recent Rx changes);  ?Extraocular muscles are intact  ?Facial sensation is intact bilaterally  ?Facial strength is intact bilaterally  ?Hearing is normal as tested by gross conversation ?Palate elevates midline, normal phonation  ?Shoulder shrug strength is intact  ?Tongue protrudes midline ? ? ?Coordination/Cerebellar ?Finger to Nose: WNL ?Heel to Shin: WNL ?Rapid alternating movements: WNL ?Finger Opposition: WNL ?Pronator Drift: Negative ? ? ?FUNCTIONAL OUTCOME MEASURES ? ? Results Comments  ?BERG 51/56 Mild deficits, in need of intervention  ?DGI 23/24 WNL  ?30s Sit to Stand 13 repetitions WNL (normative age/gender)  ?TUG 10.8 seconds WNL  ?5TSTS 12.4 seconds WNL  ?6 Minute Walk Test    ?10 Meter Gait Speed Self-selected: 10.2 s = 0.98 m/s; Fastest: 8.3s = 1.2 m/s Self-selected slightly below community ambulation speed   ?(Blank rows = not tested) ? ?mCTSIB: Conditions 1-3: 30s, Condition 4: 5s; ? ? ?TODAY'S TREATMENT ? ?SUBJECTIVE: Pt reports that she is doing well today. Denies falls or stumbles since last therapy session. Sh

## 2021-06-18 NOTE — Therapy (Signed)
[place] ?[place], [place], [address] ?Phone: [phone]   Fax:  [phone] ? ?Speech Language Pathology Treatment ? ?Patient Details  ?Name: Melissa Arroyo ?MRN: [id number] ?Date of Birth: 05-15-1950 ?Referring Provider (SLP): Hemang K Manuella Ghazi ? ? ?Encounter Date: 06/17/2021 ? ? End of Session - 06/18/21 2132   ? ? Visit Number 2   ? Number of Visits 25   ? Date for SLP Re-Evaluation 09/10/21   ? Authorization Type Medicare A/B   ? Authorization Time Period 06/12/2021 thru 09/10/2021   ? Authorization - Visit Number 2   ? Progress Note Due on Visit 10   ? SLP Start Time 1500   ? SLP Stop Time  1615   ? SLP Time Calculation (min) 75 min   ? Activity Tolerance Patient tolerated treatment well   ? ?  ?  ? ?  ? ? ?Past Medical History:  ?Diagnosis Date  ? Anxiety   ? Depression   ? Diabetes mellitus ([place])   ? Dyspnea   ? Heart attack ([place]) 04/11/2007  ? Heart attack ([place])   ? Hyperlipidemia   ? Hypertension   ? Obesity   ? Recurrent UTI   ? Renal lesion   ? Urinary incontinence in female   ? Vaginal atrophy   ? ? ?Past Surgical History:  ?Procedure Laterality Date  ? BREAST BIOPSY Right 03/29/2021  ? stereo bx-calcs, "RIBBON" clip-path pending  ? BUNIONECTOMY  2001  ? CARDIAC CATHETERIZATION    ? COLONOSCOPY WITH PROPOFOL N/A 11/27/2020  ? Procedure: COLONOSCOPY WITH PROPOFOL;  Surgeon: Virgel Manifold, MD;  Location: [facility] ENDOSCOPY;  Service: Endoscopy;  Laterality: N/A;  ? DENTAL SURGERY    ? NECK SURGERY    ? ROTATOR CUFF REPAIR Left   ? ? ?There were no vitals filed for this visit. ? ? Subjective Assessment - 06/18/21 2129   ? ? Subjective pt reports "Brain fog, freezing when people asked me something"   ? Currently in Pain? No/denies   ? ?  ?  ? ?  ? ? ? ? ? ? ? ? ADULT SLP TREATMENT - 06/18/21 0001   ? ?  ? Cognitive-Linquistic Treatment  ? Treatment focused on Cognition;Patient/family/caregiver education   ? Skilled Treatment Skilled  treatment session focused on completion of Multifactorial Memory Questionnaire (MMQ) and Neuro-QOL cognition function short form.   ? ?  ?  ? ?  ? ? ? SLP Education - 06/18/21 2131   ? ? Education Details results of additional assessments   ? Person(s) Educated Patient   ? Methods Explanation   ? Comprehension Verbalized understanding;Need further instruction;Returned demonstration   ? ?  ?  ? ?  ? ? ? SLP Short Term Goals - 06/18/21 2137   ? ?  ? SLP SHORT TERM GOAL #1  ? Title With minimal assistance, pt will plan, schedule, and execute activity/assignment/appointment with 80% accuracy.   ? Baseline new goal   ? Time 10   ? Period --   sessions  ? Status New   ?  ? SLP SHORT TERM GOAL #2  ? Title Pt will recall information after given delay of increasing length (5 to 60 seconds) with 80% accuracy given min cues.   ? Baseline new goal   ? Time 10   ? Period --   sessions  ? Status New   ?  ? SLP SHORT TERM GOAL #3  ?  Title With minimal assistance, pt will use external memory aids and compensatory strategies to aid short term recall with 80% accuracy.   ? Baseline new goal   ? Time 10   ? Period --   sessions  ? Status New   ? ?  ?  ? ?  ? ? ? SLP Long Term Goals - 06/18/21 2141   ? ?  ? SLP LONG TERM GOAL #1  ? Title Pt will a) recall and b) demonstrate use of at least three external memory strategies.   ? Baseline new goal   ? Time 12   ? Period Weeks   ? Status New   ? Target Date 09/10/21   ?  ? SLP LONG TERM GOAL #2  ? Title Patient will demonstrate improved cognitive linguistic function for IND completion of ADLs tasks in home/community environments.   ? Baseline new goal   ? Time 12   ? Period Weeks   ? Status New   ? Target Date 09/10/21   ? ?  ?  ? ?  ? ? ? Plan - 06/18/21 2132   ? ? Clinical Impression Statement Pt presents with lower than expected results on the Multifactorial Memory Questionnaire as well as deficits on the PROM Neuro-QOL. Pt would benefit from SLP intervention with focus on improving  memory and training/implementing memory strategies to increase potential return to work. Recommend increasing frequency of speech therapy to 2 x week.   ? Speech Therapy Frequency 2x / week   ? Duration 12 weeks   ? Treatment/Interventions Cueing hierarchy;Functional tasks;Patient/family education;Compensatory strategies;Internal/external aids;SLP instruction and feedback   ? Potential to Achieve Goals Good   ? Consulted and Agree with Plan of Care Patient   ? ?  ?  ? ?  ? ? ?Patient will benefit from skilled therapeutic intervention in order to improve the following deficits and impairments:   ?Cognitive communication deficit ? ?Cognitive impairment ? ?Memory deficits ? ? ? ?Problem List ?Patient Active Problem List  ? Diagnosis Date Noted  ? Type 2 diabetes mellitus with microalbuminuria, without long-term current use of insulin ([place]) 06/06/2021  ? Depression, recurrent ([place]) 06/06/2021  ? Cognitive impairment 04/24/2021  ? Memory deficits 03/27/2021  ? Panic attack due to post traumatic stress disorder (PTSD) 03/27/2021  ? Thickened nails 02/19/2021  ? Nail fungus 02/19/2021  ? Medicare annual wellness visit, subsequent 02/19/2021  ? Abnormal mammogram 02/19/2021  ? Acquired hypothyroidism 02/19/2021  ? Need for shingles vaccine 02/19/2021  ? Cough due to bronchospasm 02/19/2021  ? Hypertension associated with diabetes ([place]) 02/19/2021  ? Chronic bilateral low back pain without sciatica 11/09/2020  ? Hyperlipidemia associated with type 2 diabetes mellitus ([place]) 07/24/2014  ? ?Lucresha Dismuke B. Rutherford Nail, M.S., CCC-SLP, CBIS ?Speech-Language Pathologist ?Rehabilitation Services ?Office [phone] ? ?[place], CCC-SLP ?06/18/2021, 9:45 PM ? ?[place] ?[place], [place], [address] ?Phone: [phone]   Fax:  [phone] ? ? ?Name: Melissa Arroyo ?MRN: [id number] ?Date of Birth: [birth year] ? ?

## 2021-06-19 ENCOUNTER — Ambulatory Visit: Payer: Medicare Other | Admitting: Speech Pathology

## 2021-06-19 DIAGNOSIS — R41841 Cognitive communication deficit: Secondary | ICD-10-CM

## 2021-06-19 DIAGNOSIS — R2681 Unsteadiness on feet: Secondary | ICD-10-CM | POA: Diagnosis not present

## 2021-06-19 DIAGNOSIS — R4189 Other symptoms and signs involving cognitive functions and awareness: Secondary | ICD-10-CM

## 2021-06-19 DIAGNOSIS — R413 Other amnesia: Secondary | ICD-10-CM

## 2021-06-19 DIAGNOSIS — M545 Low back pain, unspecified: Secondary | ICD-10-CM | POA: Diagnosis not present

## 2021-06-19 DIAGNOSIS — M5459 Other low back pain: Secondary | ICD-10-CM | POA: Diagnosis not present

## 2021-06-19 NOTE — Chronic Care Management (AMB) (Signed)
  Chronic Care Management   Note  06/19/2021 Name: Melissa Arroyo MRN: [id number] DOB: 07/16/1950  Melissa Arroyo is a 71 y.o. year old female who is a primary care patient of [name], FNP. Melissa Arroyo is currently enrolled in care management services. An additional referral for Licensed Clinical SW was placed.   Follow up plan: 2nd Unsuccessful telephone outreach attempt made. A HIPAA compliant phone message was left for the patient providing contact information and requesting a return call.   Julian Hy, [place] [facility]  Direct Dial: [phone]

## 2021-06-19 NOTE — Therapy (Signed)
?OUTPATIENT PHYSICAL THERAPY BALANCE TREATMENT ? ? ?Patient Name: Melissa Arroyo ?MRN: [id number] ?DOB:Dec 15, 1950, 71 y.o., female ?Today's Date: 06/20/2021 ? ? PT End of Session - 06/20/21 1315   ? ? Visit Number 8   ? Number of Visits 25   ? Date for PT Re-Evaluation 08/19/21   ? Authorization Type eval: 05/27/21   ? PT Start Time 1315   ? PT Stop Time 1400   ? PT Time Calculation (min) 45 min   ? Equipment Utilized During Treatment Gait belt   ? Activity Tolerance Patient tolerated treatment well   ? Behavior During Therapy [facility] for tasks assessed/performed   ? ?  ?  ? ?  ? ? ?Past Medical History:  ?Diagnosis Date  ? Anxiety   ? Depression   ? Diabetes mellitus ([place])   ? Dyspnea   ? Heart attack ([place]) 04/11/2007  ? Heart attack ([place])   ? Hyperlipidemia   ? Hypertension   ? Obesity   ? Recurrent UTI   ? Renal lesion   ? Urinary incontinence in female   ? Vaginal atrophy   ? ?Past Surgical History:  ?Procedure Laterality Date  ? BREAST BIOPSY Right 03/29/2021  ? stereo bx-calcs, "RIBBON" clip-path pending  ? BUNIONECTOMY  2001  ? CARDIAC CATHETERIZATION    ? COLONOSCOPY WITH PROPOFOL N/A 11/27/2020  ? Procedure: COLONOSCOPY WITH PROPOFOL;  Surgeon: Virgel Manifold, MD;  Location: [facility] ENDOSCOPY;  Service: Endoscopy;  Laterality: N/A;  ? DENTAL SURGERY    ? NECK SURGERY    ? ROTATOR CUFF REPAIR Left   ? ?Patient Active Problem List  ? Diagnosis Date Noted  ? Type 2 diabetes mellitus with microalbuminuria, without long-term current use of insulin ([place]) 06/06/2021  ? Depression, recurrent ([place]) 06/06/2021  ? Cognitive impairment 04/24/2021  ? Memory deficits 03/27/2021  ? Panic attack due to post traumatic stress disorder (PTSD) 03/27/2021  ? Thickened nails 02/19/2021  ? Nail fungus 02/19/2021  ? Medicare annual wellness visit, subsequent 02/19/2021  ? Abnormal mammogram 02/19/2021  ? Acquired hypothyroidism 02/19/2021  ? Need for shingles vaccine 02/19/2021  ? Cough due to bronchospasm 02/19/2021  ? Hypertension  associated with diabetes ([place]) 02/19/2021  ? Chronic bilateral low back pain without sciatica 11/09/2020  ? Hyperlipidemia associated with type 2 diabetes mellitus ([place]) 07/24/2014  ? ? ?PCP: [name], FNP ? ?REFERRING PROVIDER: Vladimir Crofts, MD ? ?REFERRING DIAGNOSIS: R26.89 (ICD-10-CM) - Other abnormalities of gait and mobility ? ?THERAPY DIAG: Unsteadiness on feet ? ?ONSET DATE: 11/26/21 (approximate) ? ?FOLLOW UP APPT WITH PROVIDER: Yes, May 2023 with neurology  ? ?FROM INITIAL EVALUATION ? ?SUBJECTIVE:                                                                                                                                                                                        ? ?  Chief Complaint: Unsteadiness ? ?Pertinent History ?Pt reports difficulty with her balance which started approximately 6 months ago. At the time it started she was feeling "run down" with little energy. Pt reports that "I'm supposed to walk but I don't have the motivation." She reports chronic low back pain if she stands for an extended period of time which also impacts her motivation to stand and walk. She has been evaluated at [facility] for her back pain and was prescribed Naproxen but she never followed-up due to other issues. She complains of leg weakness and states "If I sit too long it's hard for me to get up." She would like to join the gym but is having financial difficulties and cannot afford a membership at the time. Denies dizziness or vertigo. She is being followed by neurology and complains of memory difficulty, noting difficulty with short-term memory, multitasking, decision making, brain fog. Her memory problem started in 2018 and were gradual in nature. She went on disability Sept 25th 2022, due to memory impairment and panic attacks. Has previously been evaluated by neuropsych. Hx of stress incontinence. Pt previously worked as an Therapist, music.  ? ?Pain: Yes, chronic low back pain but not at the time of  evaluation ?Numbness/Tingling: No ?Focal Weakness: Yes, pt reports increased R knee weakness recently ?Recent changes in overall health/medication: Yes, see history ?Prior history of physical therapy for balance:  No ?Falls: Has patient fallen in last 6 months? No, ?Dominant hand: right ?Imaging: Yes, brain MRI microvascular ischemic and metabolic changes (has vascular risk factors of diabetes mellitus, obesity, etc) + whole brain volume in the 5th percentile  ?Prior level of function: Independent ?Occupational demands: Retired Therapist, music, on disability ?Hobbies: working in her garden ?Red flags: urinary incontinence, negative for bowel/bladder changes, saddle paresthesia, personal history of cancer, h/o spinal tumors, h/o compression fx, abdominal pain, chills/fever, night sweats, nausea, vomiting, unrelenting pain; ? ?Precautions: None ? ?Weight Bearing Restrictions: No ? ?Living Environment ?Lives with: lives with their spouse ?Lives in: House/apartment, two levels but pt lives on first level, 5 steps to enter with bilateral wide rails; ?Has following equipment at home: shower chair, tub shower with grab bars; ? ? ?Patient Goals: "I want to get my balance back and feel like I can walk around and do things. I would like to gain strength." ? ? ?OBJECTIVE:  ? ?Patient Surveys  ?FOTO: 43, predicted improvement to 50 ?ABC: 35% ? ? ?Cognition ?Patient is oriented to person, place, and time.  ?Pt reports difficulty with recent memory but no memory loss observed during intake; ?Remote memory is intact ?Attention span and concentration are intact during session however pt reports difficulty with concentration.  ?Expressive speech is intact.  ?Patient's fund of knowledge is within normal limits for educational level. ?   ? ?Gross Musculoskeletal Assessment ?Tremor: None (pt reports R hand tremor but not observed during evaluation); ?Bulk: Normal ?Tone: Normal ? ? ?GAIT: ?Distance walked: >200' over the course of the entire  evaluation ?Assistive device utilized: None ?Level of assistance: Complete Independence ?Comments: Pt ambulates with wide base of support and decreased floor to toe clearance with some mild foot slap LLE. Forward head and rounded shoulders with forward trunk flexion during gait.  ? ? ?Posture: ?Forward head and rounded shoulders ? ? ?AROM: Deferred full testing but no gross deficits observed during functional activity; ? ? ?LE MMT: ? ?MMT (out of 5) Right ?06/20/2021 Left ?06/20/2021  ?Hip flexion 4 4  ?Hip extension    ?  Hip abduction (seated) 5 5  ?Hip adduction (seated) 5 5  ?Hip internal rotation 5 5  ?Hip external rotation 5 5  ?Knee flexion (seated) 5 5  ?Knee extension  5 5  ?Ankle dorsiflexion 5 5  ?Ankle plantarflexion Active Active  ?Ankle inversion    ?Ankle eversion    ?(* = pain; Blank rows = not tested) ? ? ?Sensation ?Grossly intact to light touch bilateral LEs as determined by testing dermatomes L2-S2. Proprioception, and hot/cold testing deferred on this date. ? ? ?Reflexes ?Deferred ? ? ?Cranial Nerves ?Visual acuity and visual fields are intact (eye exam next Monday, no recent Rx changes);  ?Extraocular muscles are intact  ?Facial sensation is intact bilaterally  ?Facial strength is intact bilaterally  ?Hearing is normal as tested by gross conversation ?Palate elevates midline, normal phonation  ?Shoulder shrug strength is intact  ?Tongue protrudes midline ? ? ?Coordination/Cerebellar ?Finger to Nose: WNL ?Heel to Shin: WNL ?Rapid alternating movements: WNL ?Finger Opposition: WNL ?Pronator Drift: Negative ? ? ?FUNCTIONAL OUTCOME MEASURES ? ? Results Comments  ?BERG 51/56 Mild deficits, in need of intervention  ?DGI 23/24 WNL  ?30s Sit to Stand 13 repetitions WNL (normative age/gender)  ?TUG 10.8 seconds WNL  ?5TSTS 12.4 seconds WNL  ?6 Minute Walk Test    ?10 Meter Gait Speed Self-selected: 10.2 s = 0.98 m/s; Fastest: 8.3s = 1.2 m/s Self-selected slightly below community ambulation speed  ?(Blank  rows = not tested) ? ?mCTSIB: Conditions 1-3: 30s, Condition 4: 5s; ? ? ?TODAY'S TREATMENT ? ?SUBJECTIVE: Pt reports that she is doing well today. Denies falls or stumbles since last therapy session. No

## 2021-06-20 ENCOUNTER — Ambulatory Visit: Payer: Medicare Other

## 2021-06-20 DIAGNOSIS — F419 Anxiety disorder, unspecified: Secondary | ICD-10-CM | POA: Diagnosis not present

## 2021-06-20 DIAGNOSIS — R2681 Unsteadiness on feet: Secondary | ICD-10-CM

## 2021-06-20 DIAGNOSIS — M545 Low back pain, unspecified: Secondary | ICD-10-CM | POA: Diagnosis not present

## 2021-06-20 DIAGNOSIS — M5459 Other low back pain: Secondary | ICD-10-CM | POA: Diagnosis not present

## 2021-06-20 DIAGNOSIS — R4189 Other symptoms and signs involving cognitive functions and awareness: Secondary | ICD-10-CM | POA: Diagnosis not present

## 2021-06-20 DIAGNOSIS — F319 Bipolar disorder, unspecified: Secondary | ICD-10-CM | POA: Diagnosis not present

## 2021-06-20 DIAGNOSIS — R413 Other amnesia: Secondary | ICD-10-CM | POA: Diagnosis not present

## 2021-06-20 DIAGNOSIS — R41841 Cognitive communication deficit: Secondary | ICD-10-CM | POA: Diagnosis not present

## 2021-06-20 NOTE — Therapy (Signed)
[facility] ?[place], [place], [address] ?Phone: [phone]   Fax:  [phone] ? ?Speech Language Pathology Treatment ? ?Patient Details  ?Name: Melissa Arroyo ?MRN: [id number] ?Date of Birth: 12-Feb-1950 ?Referring Provider (SLP): Hemang K Manuella Ghazi ? ? ?Encounter Date: 06/19/2021 ? ? End of Session - 06/20/21 1712   ? ? Visit Number 3   ? Number of Visits 25   ? Date for SLP Re-Evaluation 09/10/21   ? Authorization Type Medicare A/B   ? Authorization Time Period 06/12/2021 thru 09/10/2021   ? Authorization - Visit Number 3   ? Progress Note Due on Visit 10   ? SLP Start Time 1400   ? SLP Stop Time  1500   ? SLP Time Calculation (min) 60 min   ? Activity Tolerance Patient tolerated treatment well   ? ?  ?  ? ?  ? ? ?Past Medical History:  ?Diagnosis Date  ? Anxiety   ? Depression   ? Diabetes mellitus ([place])   ? Dyspnea   ? Heart attack ([place]) 04/11/2007  ? Heart attack ([place])   ? Hyperlipidemia   ? Hypertension   ? Obesity   ? Recurrent UTI   ? Renal lesion   ? Urinary incontinence in female   ? Vaginal atrophy   ? ? ?Past Surgical History:  ?Procedure Laterality Date  ? BREAST BIOPSY Right 03/29/2021  ? stereo bx-calcs, "RIBBON" clip-path pending  ? BUNIONECTOMY  2001  ? CARDIAC CATHETERIZATION    ? COLONOSCOPY WITH PROPOFOL N/A 11/27/2020  ? Procedure: COLONOSCOPY WITH PROPOFOL;  Surgeon: Virgel Manifold, MD;  Location: [facility] ENDOSCOPY;  Service: Endoscopy;  Laterality: N/A;  ? DENTAL SURGERY    ? NECK SURGERY    ? ROTATOR CUFF REPAIR Left   ? ? ?There were no vitals filed for this visit. ? ? Subjective Assessment - 06/20/21 1628   ? ? Subjective pt voices continued concern with work environment   ? Currently in Pain? No/denies   ? ?  ?  ? ?  ? ? ? ? ? ? ? ? ADULT SLP TREATMENT - 06/20/21 0001   ? ?  ? Cognitive-Linquistic Treatment  ? Treatment focused on Cognition;Patient/family/caregiver education   ? Skilled Treatment Skilled treatment  session focused on pt's cognition goals. SLP facilitated session by providing moderate assistance to complete Trail Making Task. Prior to this activity pt completed similar tasks independently but reported that her supervisor would demand for the task to be complete in a shorter amount of time. The amount of time spent on completing the task was functional to me but in an effort to compare rate of complete, the timed Trail Making Task was provided. Unfortunately her approach to the task was very different as a result of the "time pressure." She psychological state worsened and impeded her ability to complete task accurately. In addition her physical state changed as well as he began breathing hard. Pt reports this same "freezing" in reports to becoming overwhelmed. Pt has appt with neurology tomorrow and requests that I reach out prior to her appt with these observations regarding pt's psychological/emotional state and its impact on her cognitive abilities.   ? ?  ?  ? ?  ? ? ? SLP Education - 06/20/21 1711   ? ? Education Details impact that psychological/emotional state has on cognitive function   ? Person(s) Educated Patient   ? Methods Explanation   ? Comprehension  Verbalized understanding;Returned demonstration   ? ?  ?  ? ?  ? ? ? SLP Short Term Goals - 06/18/21 2137   ? ?  ? SLP SHORT TERM GOAL #1  ? Title With minimal assistance, pt will plan, schedule, and execute activity/assignment/appointment with 80% accuracy.   ? Baseline new goal   ? Time 10   ? Period --   sessions  ? Status New   ?  ? SLP SHORT TERM GOAL #2  ? Title Pt will recall information after given delay of increasing length (5 to 60 seconds) with 80% accuracy given min cues.   ? Baseline new goal   ? Time 10   ? Period --   sessions  ? Status New   ?  ? SLP SHORT TERM GOAL #3  ? Title With minimal assistance, pt will use external memory aids and compensatory strategies to aid short term recall with 80% accuracy.   ? Baseline new goal   ? Time  10   ? Period --   sessions  ? Status New   ? ?  ?  ? ?  ? ? ? SLP Long Term Goals - 06/18/21 2141   ? ?  ? SLP LONG TERM GOAL #1  ? Title Pt will a) recall and b) demonstrate use of at least three external memory strategies.   ? Baseline new goal   ? Time 12   ? Period Weeks   ? Status New   ? Target Date 09/10/21   ?  ? SLP LONG TERM GOAL #2  ? Title Patient will demonstrate improved cognitive linguistic function for IND completion of ADLs tasks in home/community environments.   ? Baseline new goal   ? Time 12   ? Period Weeks   ? Status New   ? Target Date 09/10/21   ? ?  ?  ? ?  ? ? ? Plan - 06/20/21 1713   ? ? Clinical Impression Statement Concern that pt's psychological/emotional might be impacting her cognitive abilities. Will continue course of ST services to help pt compensate with request further differential assessment by neurology.   ? Speech Therapy Frequency 2x / week   ? Duration 12 weeks   ? Treatment/Interventions Cueing hierarchy;Functional tasks;Patient/family education;Compensatory strategies;Internal/external aids;SLP instruction and feedback   ? Potential to Achieve Goals Good   ? Potential Considerations Previous level of function;Severity of impairments;Cooperation/participation level   ? Consulted and Agree with Plan of Care Patient   ? ?  ?  ? ?  ? ? ?Patient will benefit from skilled therapeutic intervention in order to improve the following deficits and impairments:   ?Cognitive communication deficit ? ?Cognitive impairment ? ?Memory deficits ? ? ? ?Problem List ?Patient Active Problem List  ? Diagnosis Date Noted  ? Type 2 diabetes mellitus with microalbuminuria, without long-term current use of insulin ([place]) 06/06/2021  ? Depression, recurrent ([place]) 06/06/2021  ? Cognitive impairment 04/24/2021  ? Memory deficits 03/27/2021  ? Panic attack due to post traumatic stress disorder (PTSD) 03/27/2021  ? Thickened nails 02/19/2021  ? Nail fungus 02/19/2021  ? Medicare annual wellness visit,  subsequent 02/19/2021  ? Abnormal mammogram 02/19/2021  ? Acquired hypothyroidism 02/19/2021  ? Need for shingles vaccine 02/19/2021  ? Cough due to bronchospasm 02/19/2021  ? Hypertension associated with diabetes ([place]) 02/19/2021  ? Chronic bilateral low back pain without sciatica 11/09/2020  ? Hyperlipidemia associated with type 2 diabetes mellitus ([place]) 07/24/2014  ? ?Omarius Grantham  Mart Piggs, M.S., CCC-SLP, CBIS ?Speech-Language Pathologist ?Rehabilitation Services ?Office [phone] ? ?[place], CCC-SLP ?06/20/2021, 5:19 PM ? ?[facility] ?[place], [place], [address] ?Phone: [phone]   Fax:  [phone] ? ? ?Name: YOLINDA DUERR ?MRN: [id number] ?Date of Birth: 06-Nov-1950 ? ?

## 2021-06-24 ENCOUNTER — Ambulatory Visit: Payer: Medicare Other | Admitting: Speech Pathology

## 2021-06-24 DIAGNOSIS — R4189 Other symptoms and signs involving cognitive functions and awareness: Secondary | ICD-10-CM | POA: Diagnosis not present

## 2021-06-24 DIAGNOSIS — M545 Low back pain, unspecified: Secondary | ICD-10-CM | POA: Diagnosis not present

## 2021-06-24 DIAGNOSIS — R413 Other amnesia: Secondary | ICD-10-CM | POA: Diagnosis not present

## 2021-06-24 DIAGNOSIS — R41841 Cognitive communication deficit: Secondary | ICD-10-CM | POA: Diagnosis not present

## 2021-06-24 DIAGNOSIS — R2681 Unsteadiness on feet: Secondary | ICD-10-CM | POA: Diagnosis not present

## 2021-06-24 DIAGNOSIS — M5459 Other low back pain: Secondary | ICD-10-CM | POA: Diagnosis not present

## 2021-06-25 ENCOUNTER — Encounter: Payer: Self-pay | Admitting: Family Medicine

## 2021-06-25 ENCOUNTER — Ambulatory Visit: Payer: Medicare Other

## 2021-06-25 DIAGNOSIS — R4189 Other symptoms and signs involving cognitive functions and awareness: Secondary | ICD-10-CM | POA: Diagnosis not present

## 2021-06-25 DIAGNOSIS — M5459 Other low back pain: Secondary | ICD-10-CM | POA: Diagnosis not present

## 2021-06-25 DIAGNOSIS — R2681 Unsteadiness on feet: Secondary | ICD-10-CM | POA: Diagnosis not present

## 2021-06-25 DIAGNOSIS — R413 Other amnesia: Secondary | ICD-10-CM | POA: Diagnosis not present

## 2021-06-25 DIAGNOSIS — R41841 Cognitive communication deficit: Secondary | ICD-10-CM | POA: Diagnosis not present

## 2021-06-25 DIAGNOSIS — M545 Low back pain, unspecified: Secondary | ICD-10-CM | POA: Diagnosis not present

## 2021-06-25 NOTE — Therapy (Signed)
OUTPATIENT PHYSICAL THERAPY BALANCE TREATMENT/PROGRESS NOTE/DISCHARGE  Dates of reporting period  05/27/21   to   06/28/10    Patient Name: Melissa Arroyo MRN: [id number] DOB:Nov 02, 1950, 71 y.o., female Today's Date: 06/27/2021   PT End of Session - 06/27/21 1313     Visit Number 10    Number of Visits 25    Date for PT Re-Evaluation 08/19/21    Authorization Type eval: 05/27/21    PT Start Time 1315    PT Stop Time 1400    PT Time Calculation (min) 45 min    Equipment Utilized During Treatment Gait belt    Activity Tolerance Patient tolerated treatment well    Behavior During Therapy [facility] for tasks assessed/performed               Past Medical History:  Diagnosis Date   Anxiety    Depression    Diabetes mellitus ([place])    Dyspnea    Heart attack ([place]) 04/11/2007   Heart attack ([place])    Hyperlipidemia    Hypertension    Obesity    Recurrent UTI    Renal lesion    Urinary incontinence in female    Vaginal atrophy    Past Surgical History:  Procedure Laterality Date   BREAST BIOPSY Right 03/29/2021   stereo bx-calcs, "RIBBON" clip-path pending   BUNIONECTOMY  2001   CARDIAC CATHETERIZATION     COLONOSCOPY WITH PROPOFOL N/A 11/27/2020   Procedure: COLONOSCOPY WITH PROPOFOL;  Surgeon: Virgel Manifold, MD;  Location: [facility] ENDOSCOPY;  Service: Endoscopy;  Laterality: N/A;   DENTAL SURGERY     NECK SURGERY     ROTATOR CUFF REPAIR Left    Patient Active Problem List   Diagnosis Date Noted   Type 2 diabetes mellitus with microalbuminuria, without long-term current use of insulin ([place]) 06/06/2021   Depression, recurrent ([place]) 06/06/2021   Cognitive impairment 04/24/2021   Memory deficits 03/27/2021   Panic attack due to post traumatic stress disorder (PTSD) 03/27/2021   Thickened nails 02/19/2021   Nail fungus 02/19/2021   Medicare annual wellness visit, subsequent 02/19/2021   Abnormal mammogram 02/19/2021   Acquired hypothyroidism 02/19/2021   Need for  shingles vaccine 02/19/2021   Cough due to bronchospasm 02/19/2021   Hypertension associated with diabetes ([place]) 02/19/2021   Chronic bilateral low back pain without sciatica 11/09/2020   Hyperlipidemia associated with type 2 diabetes mellitus ([place]) 07/24/2014    PCP: [name], FNP  REFERRING PROVIDER: Vladimir Crofts, MD  REFERRING DIAGNOSIS: R26.89 (ICD-10-CM) - Other abnormalities of gait and mobility  THERAPY DIAG: Unsteadiness on feet  ONSET DATE: 11/26/21 (approximate)  FOLLOW UP APPT WITH PROVIDER: Yes, May 2023 with neurology   FROM INITIAL EVALUATION  SUBJECTIVE:  Chief Complaint: Unsteadiness  Pertinent History Pt reports difficulty with her balance which started approximately 6 months ago. At the time it started she was feeling "run down" with little energy. Pt reports that "I'm supposed to walk but I don't have the motivation." She reports chronic low back pain if she stands for an extended period of time which also impacts her motivation to stand and walk. She has been evaluated at [facility] for her back pain and was prescribed Naproxen but she never followed-up due to other issues. She complains of leg weakness and states "If I sit too long it's hard for me to get up." She would like to join the gym but is having financial difficulties and cannot afford a membership at the time. Denies dizziness or vertigo. She is being followed by neurology and complains of memory difficulty, noting difficulty with short-term memory, multitasking, decision making, brain fog. Her memory problem started in 2018 and were gradual in nature. She went on disability Sept 25th 2022, due to memory impairment and panic attacks. Has previously been evaluated by neuropsych. Hx of stress incontinence. Pt previously  worked as an Therapist, music.   Pain: Yes, chronic low back pain but not at the time of evaluation Numbness/Tingling: No Focal Weakness: Yes, pt reports increased R knee weakness recently Recent changes in overall health/medication: Yes, see history Prior history of physical therapy for balance:  No Falls: Has patient fallen in last 6 months? No, Dominant hand: right Imaging: Yes, brain MRI microvascular ischemic and metabolic changes (has vascular risk factors of diabetes mellitus, obesity, etc) + whole brain volume in the 5th percentile  Prior level of function: Independent Occupational demands: Retired Therapist, music, on disability Hobbies: working in her garden Red flags: urinary incontinence, negative for bowel/bladder changes, saddle paresthesia, personal history of cancer, h/o spinal tumors, h/o compression fx, abdominal pain, chills/fever, night sweats, nausea, vomiting, unrelenting pain;  Precautions: None  Weight Bearing Restrictions: No  Living Environment Lives with: lives with their spouse Lives in: House/apartment, two levels but pt lives on first level, 5 steps to enter with bilateral wide rails; Has following equipment at home: shower chair, tub shower with grab bars;   Patient Goals: "I want to get my balance back and feel like I can walk around and do things. I would like to gain strength."   OBJECTIVE:   Patient Surveys  FOTO: 3, predicted improvement to 50 ABC: 35%   Cognition Patient is oriented to person, place, and time.  Pt reports difficulty with recent memory but no memory loss observed during intake; Remote memory is intact Attention span and concentration are intact during session however pt reports difficulty with concentration.  Expressive speech is intact.  Patient's fund of knowledge is within normal limits for educational level.     Gross Musculoskeletal Assessment Tremor: None (pt reports R hand tremor but not observed during evaluation); Bulk:  Normal Tone: Normal   GAIT: Distance walked: >200' over the course of the entire evaluation Assistive device utilized: None Level of assistance: Complete Independence Comments: Pt ambulates with wide base of support and decreased floor to toe clearance with some mild foot slap LLE. Forward head and rounded shoulders with forward trunk flexion during gait.    Posture: Forward head and rounded shoulders   AROM: Deferred full testing but no gross deficits observed during functional activity;   LE MMT:  MMT (out of 5) Right 06/27/2021 Left 06/27/2021  Hip flexion 4 4  Hip extension  Hip abduction (seated) 5 5  Hip adduction (seated) 5 5  Hip internal rotation 5 5  Hip external rotation 5 5  Knee flexion (seated) 5 5  Knee extension  5 5  Ankle dorsiflexion 5 5  Ankle plantarflexion Active Active  Ankle inversion    Ankle eversion    (* = pain; Blank rows = not tested)   Sensation Grossly intact to light touch bilateral LEs as determined by testing dermatomes L2-S2. Proprioception, and hot/cold testing deferred on this date.   Reflexes Deferred   Cranial Nerves Visual acuity and visual fields are intact (eye exam next Monday, no recent Rx changes);  Extraocular muscles are intact  Facial sensation is intact bilaterally  Facial strength is intact bilaterally  Hearing is normal as tested by gross conversation Palate elevates midline, normal phonation  Shoulder shrug strength is intact  Tongue protrudes midline   Coordination/Cerebellar Finger to Nose: WNL Heel to Shin: WNL Rapid alternating movements: WNL Finger Opposition: WNL Pronator Drift: Negative   FUNCTIONAL OUTCOME MEASURES   Results Comments  BERG 51/56 Mild deficits, in need of intervention  DGI 23/24 WNL  30s Sit to Stand 13 repetitions WNL (normative age/gender)  TUG 10.8 seconds WNL  5TSTS 12.4 seconds WNL  6 Minute Walk Test    10 Meter Gait Speed Self-selected: 10.2 s = 0.98 m/s;  Fastest: 8.3s = 1.2 m/s Self-selected slightly below community ambulation speed  (Blank rows = not tested)  mCTSIB: Conditions 1-3: 30s, Condition 4: 5s;   TODAY'S TREATMENT  SUBJECTIVE: Pt reports that she is doing well today. Denies falls or stumbles since last therapy session. No back pain upon arrival. She continues to feel like her balance and leg strength are improving. No specific questions or concerns currently  PAIN: No   Neuromuscular Re-education  Updated outcome measures with patient: FOTO: 53 BERG: 52/56 ABC: 66.9% Practiced stairs x 3 trials with combination of step-to and reciprocal patterns with no UE support and CGA from therapist;  Alternating 6" step taps x 10 BLE; Standing heel/toe raises 3s hold x 10 each; Double black theraband tube resisted gait in // bars forward, backward, R lateral, and L lateral without UE support x 5 each direction;   Ther-ex  NuStep L2 x 5 minutes for warm-up during history with therapy adjusting resistance as appropriate;  Standing hip strengthening with 7.5# ankle weights: Hip flexion marches x 15 BLE; HS curls x 15 BLE; Hip abduction x 15 BLE; Hip extension x 15 BLE;  Seated LAQ with 7.5# ankle weights x 15; Squats with glute tap on chair x 10;   PATIENT EDUCATION:  Education details: Outcome measures, goals, and discharge Person educated: Patient Education method: Explanation Education comprehension: verbalized understanding   HOME EXERCISE PROGRAM: Access Code: [email or web address] URL: [email or web address] Date: 06/27/2021 Prepared by: Roxana Hires  Exercises - Single Leg Stance  - 1 x daily - 7 x weekly - 3 x 30s on each leg hold - Heel Toe Raises with Counter Support  - 1 x daily - 7 x weekly - 2 sets - 10 reps - 3s hold - Tandem Walking Next to Counter  - 1 x daily - 7 x weekly - 3 reps - 60s hold - Sit to Stand Without Arm Support  - 1 x daily - 7 x weekly - 2 sets - 10 reps - Seated Hip Abduction with  Resistance  - 1 x daily - 7 x weekly - 2 sets - 10 reps - 3s  hold - Seated Hip Adduction Isometrics with Ball  - 1 x daily - 7 x weekly - 2 sets - 10 reps - 3s hold   ASSESSMENT:  CLINICAL IMPRESSION: Patient demonstrates excellent motivation during session today. Updated outcome measures and goals with patient today. Her ABC improved from 35% at the initial evaluation to 66.9% today and her FOTO score improved from 40 to 53. Her BERG improved from 51/56 to 52/56 which is not clinically significant however it was already very high to start with.  Today's session focused on both balance and strength exercises. Pt denies any pain during session. Pt will be discharged from balance therapy and will return back for an evaluation of her back pain based on the new order that was received.  REHAB POTENTIAL: Good  CLINICAL DECISION MAKING: Stable/uncomplicated  EVALUATION COMPLEXITY: Low   GOALS: Goals reviewed with patient? No  SHORT TERM GOALS: Target date: 08/08/2021  Pt will be independent with HEP in order to improve strength and balance in order to decrease fall risk and improve function at home. Baseline:  Goal status: ACHIEVED   LONG TERM GOALS: Target date: 08/19/2021  Pt will increase FOTO to at least 50 to demonstrate significant improvement in function at home related to balance  Baseline: 05/27/21: 43, 06/27/21: 53 Goal status: ACHIEVED  2.  Pt will improve BERG by at least 3 points in order to demonstrate clinically significant improvement in balance.   Baseline: 05/27/21: 51/56; 06/27/21: 52/56 Goal status: PARTIALLY MET  3.  Pt will improve ABC by at least 13% in order to demonstrate clinically significant improvement in balance confidence.     Baseline: 05/27/21: 35%; 06/27/21: 66.9% Goal status: ACHIEVED   PLAN: PT FREQUENCY: 2x/week  PT DURATION: 12 weeks  PLANNED INTERVENTIONS: Therapeutic exercises, Therapeutic activity, Neuromuscular re-education, Balance training,  Gait training, Patient/Family education, Joint manipulation, Joint mobilization, Canalith repositioning, Aquatic Therapy, Dry Needling, Cognitive remediation, Electrical stimulation, Spinal manipulation, Spinal mobilization, Cryotherapy, Moist heat, Traction, Ultrasound, Ionotophoresis 64m/ml Dexamethasone, and Manual therapy  PLAN FOR NEXT SESSION: Discharge  JLyndel SafeHuprich PT, DPT, GCS  Lucylle Foulkes 06/27/2021, 3:09 PM

## 2021-06-25 NOTE — Patient Instructions (Signed)
Log in to [place] on computer ?

## 2021-06-25 NOTE — Therapy (Signed)
?OUTPATIENT PHYSICAL THERAPY BALANCE TREATMENT ? ? ?Patient Name: Melissa Arroyo ?MRN: [id number] ?DOB:1950/12/29, 71 y.o., female ?Today's Date: 06/25/2021 ? ? PT End of Session - 06/25/21 1357   ? ? Visit Number 9   ? Number of Visits 25   ? Date for PT Re-Evaluation 08/19/21   ? Authorization Type eval: 05/27/21   ? PT Start Time 1400   ? PT Stop Time 0623   ? PT Time Calculation (min) 45 min   ? Equipment Utilized During Treatment Gait belt   ? Activity Tolerance Patient tolerated treatment well   ? Behavior During Therapy [facility] for tasks assessed/performed   ? ?  ?  ? ?  ? ? ? ?Past Medical History:  ?Diagnosis Date  ? Anxiety   ? Depression   ? Diabetes mellitus ([place])   ? Dyspnea   ? Heart attack ([place]) 04/11/2007  ? Heart attack ([place])   ? Hyperlipidemia   ? Hypertension   ? Obesity   ? Recurrent UTI   ? Renal lesion   ? Urinary incontinence in female   ? Vaginal atrophy   ? ?Past Surgical History:  ?Procedure Laterality Date  ? BREAST BIOPSY Right 03/29/2021  ? stereo bx-calcs, "RIBBON" clip-path pending  ? BUNIONECTOMY  2001  ? CARDIAC CATHETERIZATION    ? COLONOSCOPY WITH PROPOFOL N/A 11/27/2020  ? Procedure: COLONOSCOPY WITH PROPOFOL;  Surgeon: Virgel Manifold, MD;  Location: [facility] ENDOSCOPY;  Service: Endoscopy;  Laterality: N/A;  ? DENTAL SURGERY    ? NECK SURGERY    ? ROTATOR CUFF REPAIR Left   ? ?Patient Active Problem List  ? Diagnosis Date Noted  ? Type 2 diabetes mellitus with microalbuminuria, without long-term current use of insulin ([place]) 06/06/2021  ? Depression, recurrent ([place]) 06/06/2021  ? Cognitive impairment 04/24/2021  ? Memory deficits 03/27/2021  ? Panic attack due to post traumatic stress disorder (PTSD) 03/27/2021  ? Thickened nails 02/19/2021  ? Nail fungus 02/19/2021  ? Medicare annual wellness visit, subsequent 02/19/2021  ? Abnormal mammogram 02/19/2021  ? Acquired hypothyroidism 02/19/2021  ? Need for shingles vaccine 02/19/2021  ? Cough due to bronchospasm 02/19/2021  ?  Hypertension associated with diabetes ([place]) 02/19/2021  ? Chronic bilateral low back pain without sciatica 11/09/2020  ? Hyperlipidemia associated with type 2 diabetes mellitus ([place]) 07/24/2014  ? ? ?PCP: [name], FNP ? ?REFERRING PROVIDER: Vladimir Crofts, MD ? ?REFERRING DIAGNOSIS: R26.89 (ICD-10-CM) - Other abnormalities of gait and mobility ? ?THERAPY DIAG: Unsteadiness on feet ? ?ONSET DATE: 11/26/21 (approximate) ? ?FOLLOW UP APPT WITH PROVIDER: Yes, May 2023 with neurology  ? ?FROM INITIAL EVALUATION ? ?SUBJECTIVE:                                                                                                                                                                                        ? ?  Chief Complaint: Unsteadiness ? ?Pertinent History ?Pt reports difficulty with her balance which started approximately 6 months ago. At the time it started she was feeling "run down" with little energy. Pt reports that "I'm supposed to walk but I don't have the motivation." She reports chronic low back pain if she stands for an extended period of time which also impacts her motivation to stand and walk. She has been evaluated at [facility] for her back pain and was prescribed Naproxen but she never followed-up due to other issues. She complains of leg weakness and states "If I sit too long it's hard for me to get up." She would like to join the gym but is having financial difficulties and cannot afford a membership at the time. Denies dizziness or vertigo. She is being followed by neurology and complains of memory difficulty, noting difficulty with short-term memory, multitasking, decision making, brain fog. Her memory problem started in 2018 and were gradual in nature. She went on disability Sept 25th 2022, due to memory impairment and panic attacks. Has previously been evaluated by neuropsych. Hx of stress incontinence. Pt previously worked as an Therapist, music.  ? ?Pain: Yes, chronic low back pain but not at  the time of evaluation ?Numbness/Tingling: No ?Focal Weakness: Yes, pt reports increased R knee weakness recently ?Recent changes in overall health/medication: Yes, see history ?Prior history of physical therapy for balance:  No ?Falls: Has patient fallen in last 6 months? No, ?Dominant hand: right ?Imaging: Yes, brain MRI microvascular ischemic and metabolic changes (has vascular risk factors of diabetes mellitus, obesity, etc) + whole brain volume in the 5th percentile  ?Prior level of function: Independent ?Occupational demands: Retired Therapist, music, on disability ?Hobbies: working in her garden ?Red flags: urinary incontinence, negative for bowel/bladder changes, saddle paresthesia, personal history of cancer, h/o spinal tumors, h/o compression fx, abdominal pain, chills/fever, night sweats, nausea, vomiting, unrelenting pain; ? ?Precautions: None ? ?Weight Bearing Restrictions: No ? ?Living Environment ?Lives with: lives with their spouse ?Lives in: House/apartment, two levels but pt lives on first level, 5 steps to enter with bilateral wide rails; ?Has following equipment at home: shower chair, tub shower with grab bars; ? ? ?Patient Goals: "I want to get my balance back and feel like I can walk around and do things. I would like to gain strength." ? ? ?OBJECTIVE:  ? ?Patient Surveys  ?FOTO: 43, predicted improvement to 50 ?ABC: 35% ? ? ?Cognition ?Patient is oriented to person, place, and time.  ?Pt reports difficulty with recent memory but no memory loss observed during intake; ?Remote memory is intact ?Attention span and concentration are intact during session however pt reports difficulty with concentration.  ?Expressive speech is intact.  ?Patient's fund of knowledge is within normal limits for educational level. ?   ? ?Gross Musculoskeletal Assessment ?Tremor: None (pt reports R hand tremor but not observed during evaluation); ?Bulk: Normal ?Tone: Normal ? ? ?GAIT: ?Distance walked: >200' over the course of  the entire evaluation ?Assistive device utilized: None ?Level of assistance: Complete Independence ?Comments: Pt ambulates with wide base of support and decreased floor to toe clearance with some mild foot slap LLE. Forward head and rounded shoulders with forward trunk flexion during gait.  ? ? ?Posture: ?Forward head and rounded shoulders ? ? ?AROM: Deferred full testing but no gross deficits observed during functional activity; ? ? ?LE MMT: ? ?MMT (out of 5) Right ?06/25/2021 Left ?06/25/2021  ?Hip flexion 4 4  ?Hip extension    ?  Hip abduction (seated) 5 5  ?Hip adduction (seated) 5 5  ?Hip internal rotation 5 5  ?Hip external rotation 5 5  ?Knee flexion (seated) 5 5  ?Knee extension  5 5  ?Ankle dorsiflexion 5 5  ?Ankle plantarflexion Active Active  ?Ankle inversion    ?Ankle eversion    ?(* = pain; Blank rows = not tested) ? ? ?Sensation ?Grossly intact to light touch bilateral LEs as determined by testing dermatomes L2-S2. Proprioception, and hot/cold testing deferred on this date. ? ? ?Reflexes ?Deferred ? ? ?Cranial Nerves ?Visual acuity and visual fields are intact (eye exam next Monday, no recent Rx changes);  ?Extraocular muscles are intact  ?Facial sensation is intact bilaterally  ?Facial strength is intact bilaterally  ?Hearing is normal as tested by gross conversation ?Palate elevates midline, normal phonation  ?Shoulder shrug strength is intact  ?Tongue protrudes midline ? ? ?Coordination/Cerebellar ?Finger to Nose: WNL ?Heel to Shin: WNL ?Rapid alternating movements: WNL ?Finger Opposition: WNL ?Pronator Drift: Negative ? ? ?FUNCTIONAL OUTCOME MEASURES ? ? Results Comments  ?BERG 51/56 Mild deficits, in need of intervention  ?DGI 23/24 WNL  ?30s Sit to Stand 13 repetitions WNL (normative age/gender)  ?TUG 10.8 seconds WNL  ?5TSTS 12.4 seconds WNL  ?6 Minute Walk Test    ?10 Meter Gait Speed Self-selected: 10.2 s = 0.98 m/s; Fastest: 8.3s = 1.2 m/s Self-selected slightly below community ambulation speed   ?(Blank rows = not tested) ? ?mCTSIB: Conditions 1-3: 30s, Condition 4: 5s; ? ? ?TODAY'S TREATMENT ? ?SUBJECTIVE: Pt reports that she is doing well today. Denies falls or stumbles since last therapy session. N

## 2021-06-25 NOTE — Therapy (Signed)
[place] ?[place], [place], [address] ?Phone: [phone]   Fax:  [phone] ? ?Speech Language Pathology Treatment ? ?Patient Details  ?Name: Melissa Arroyo ?MRN: [id number] ?Date of Birth: [birth year] ?Referring Provider (SLP): Hemang K Manuella Ghazi ? ? ?Encounter Date: 06/24/2021 ? ? End of Session - 06/25/21 1703   ? ? Visit Number 4   ? Number of Visits 25   ? Date for SLP Re-Evaluation 09/10/21   ? Authorization Type Medicare A/B   ? Authorization Time Period 06/12/2021 thru 09/10/2021   ? Authorization - Visit Number 4   ? Progress Note Due on Visit 10   ? SLP Start Time 1400   ? SLP Stop Time  1500   ? SLP Time Calculation (min) 60 min   ? Activity Tolerance Patient tolerated treatment well   ? ?  ?  ? ?  ? ? ?Past Medical History:  ?Diagnosis Date  ? Anxiety   ? Depression   ? Diabetes mellitus ([place])   ? Dyspnea   ? Heart attack ([place]) 04/11/2007  ? Heart attack ([place])   ? Hyperlipidemia   ? Hypertension   ? Obesity   ? Recurrent UTI   ? Renal lesion   ? Urinary incontinence in female   ? Vaginal atrophy   ? ? ?Past Surgical History:  ?Procedure Laterality Date  ? BREAST BIOPSY Right 03/29/2021  ? stereo bx-calcs, "RIBBON" clip-path pending  ? BUNIONECTOMY  2001  ? CARDIAC CATHETERIZATION    ? COLONOSCOPY WITH PROPOFOL N/A 11/27/2020  ? Procedure: COLONOSCOPY WITH PROPOFOL;  Surgeon: Virgel Manifold, MD;  Location: [facility] ENDOSCOPY;  Service: Endoscopy;  Laterality: N/A;  ? DENTAL SURGERY    ? NECK SURGERY    ? ROTATOR CUFF REPAIR Left   ? ? ?There were no vitals filed for this visit. ? ? Subjective Assessment - 06/25/21 1702   ? ? Subjective "thank you for listening and supporting me"   ? Currently in Pain? No/denies   ? ?  ?  ? ?  ? ? ? ? ? ? ? ? ADULT SLP TREATMENT - 06/25/21 0001   ? ?  ? Cognitive-Linquistic Treatment  ? Treatment focused on Cognition;Patient/family/caregiver education   ? Skilled Treatment Skilled treatment session  focused on pt's cognitive impairments with focus on recall, visuospatial organization and strategy generation for problem solving.      ? ?SLP facilitated session by providing the following interventions:      ? ?TalkPath Therapy App was utilized to target problem solving with ADLs   ?[employer]: Level 1- 90%   ?Reading Calendars and Schedules: Level 2 - 60% improving to 100% with moderate faded to minimal assistance; Level 3 - 100% - good carryover of strategies   ?Shopping Mall: Level 1 - 70% improving to 100% with rare minimal cues     ? ?SLP further facilitated session by instructing pt in novel complex card game ([place]). Pt with good recall of rules as well as basic ability to play.   ? ?  ?  ? ?  ? ? ? SLP Education - 06/25/21 1702   ? ? Education Details TalkPath Therapy and log in information   ? Person(s) Educated Patient   ? Methods Explanation;Demonstration;Verbal cues;Handout   ? Comprehension Verbalized understanding;Returned demonstration   ? ?  ?  ? ?  ? ? ? SLP Short Term Goals - 06/18/21 2137   ? ?  ?  SLP SHORT TERM GOAL #1  ? Title With minimal assistance, pt will plan, schedule, and execute activity/assignment/appointment with 80% accuracy.   ? Baseline new goal   ? Time 10   ? Period --   sessions  ? Status New   ?  ? SLP SHORT TERM GOAL #2  ? Title Pt will recall information after given delay of increasing length (5 to 60 seconds) with 80% accuracy given min cues.   ? Baseline new goal   ? Time 10   ? Period --   sessions  ? Status New   ?  ? SLP SHORT TERM GOAL #3  ? Title With minimal assistance, pt will use external memory aids and compensatory strategies to aid short term recall with 80% accuracy.   ? Baseline new goal   ? Time 10   ? Period --   sessions  ? Status New   ? ?  ?  ? ?  ? ? ? SLP Long Term Goals - 06/18/21 2141   ? ?  ? SLP LONG TERM GOAL #1  ? Title Pt will a) recall and b) demonstrate use of at least three external memory strategies.   ? Baseline new  goal   ? Time 12   ? Period Weeks   ? Status New   ? Target Date 09/10/21   ?  ? SLP LONG TERM GOAL #2  ? Title Patient will demonstrate improved cognitive linguistic function for IND completion of ADLs tasks in home/community environments.   ? Baseline new goal   ? Time 12   ? Period Weeks   ? Status New   ? Target Date 09/10/21   ? ?  ?  ? ?  ? ? ? Plan - 06/25/21 1703   ? ? Clinical Impression Statement Pt presents with improving higher level cognitive deficits both within structured tasks and functional higher level tasks. Prognosis for continued improvement is good, therefore skilled ST intervention continues to be appropriate to improve her deficits to increase functional independence as well as return to work, leisure and community activities.   ? Speech Therapy Frequency 2x / week   ? Duration 12 weeks   ? Treatment/Interventions Cueing hierarchy;Functional tasks;Patient/family education;Compensatory strategies;Internal/external aids;SLP instruction and feedback   ? Potential to Achieve Goals Good   ? SLP Home Exercise Plan provided, see pt instructions section   ? Consulted and Agree with Plan of Care Patient   ? ?  ?  ? ?  ? ? ?Patient will benefit from skilled therapeutic intervention in order to improve the following deficits and impairments:   ?Cognitive communication deficit ? ?Cognitive impairment ? ?Memory deficits ? ? ? ?Problem List ?Patient Active Problem List  ? Diagnosis Date Noted  ? Type 2 diabetes mellitus with microalbuminuria, without long-term current use of insulin ([place]) 06/06/2021  ? Depression, recurrent ([place]) 06/06/2021  ? Cognitive impairment 04/24/2021  ? Memory deficits 03/27/2021  ? Panic attack due to post traumatic stress disorder (PTSD) 03/27/2021  ? Thickened nails 02/19/2021  ? Nail fungus 02/19/2021  ? Medicare annual wellness visit, subsequent 02/19/2021  ? Abnormal mammogram 02/19/2021  ? Acquired hypothyroidism 02/19/2021  ? Need for shingles vaccine 02/19/2021  ? Cough  due to bronchospasm 02/19/2021  ? Hypertension associated with diabetes ([place]) 02/19/2021  ? Chronic bilateral low back pain without sciatica 11/09/2020  ? Hyperlipidemia associated with type 2 diabetes mellitus ([place]) 07/24/2014  ? ?Keyonta Barradas B. Rutherford Nail, M.S., CCC-SLP, CBIS ?Speech-Language Pathologist ?  Rehabilitation Services ?Office [phone] ? ? ?[place], [place] ?06/25/2021, 5:04 PM ? ?[facility] ?[place], [place], [address] ?Phone: [phone]   Fax:  [phone] ? ? ?Name: RAYLYN CARTON ?MRN: [id number] ?Date of Birth: [birth year] ? ?

## 2021-06-26 ENCOUNTER — Ambulatory Visit: Payer: Medicare Other | Admitting: Speech Pathology

## 2021-06-26 DIAGNOSIS — M5459 Other low back pain: Secondary | ICD-10-CM | POA: Diagnosis not present

## 2021-06-26 DIAGNOSIS — R413 Other amnesia: Secondary | ICD-10-CM

## 2021-06-26 DIAGNOSIS — R4189 Other symptoms and signs involving cognitive functions and awareness: Secondary | ICD-10-CM | POA: Diagnosis not present

## 2021-06-26 DIAGNOSIS — M545 Low back pain, unspecified: Secondary | ICD-10-CM | POA: Diagnosis not present

## 2021-06-26 DIAGNOSIS — R41841 Cognitive communication deficit: Secondary | ICD-10-CM

## 2021-06-26 DIAGNOSIS — R2681 Unsteadiness on feet: Secondary | ICD-10-CM | POA: Diagnosis not present

## 2021-06-26 NOTE — Therapy (Signed)
[place] ?[facility] ?[place], [place], [address] ?Phone: [phone]   Fax:  [phone] ? ?Speech Language Pathology Treatment ? ?Patient Details  ?Name: Melissa Arroyo ?MRN: [id number] ?Date of Birth: 1950-06-01 ?Referring Provider (SLP): Hemang K Manuella Ghazi ? ? ?Encounter Date: 06/26/2021 ? ? End of Session - 06/26/21 1623   ? ? Visit Number 5   ? Number of Visits 25   ? Date for SLP Re-Evaluation 09/10/21   ? Authorization Type Medicare A/B   ? Authorization Time Period 06/12/2021 thru 09/10/2021   ? Authorization - Visit Number 5   ? Progress Note Due on Visit 10   ? SLP Start Time 1400   ? SLP Stop Time  1500   ? SLP Time Calculation (min) 60 min   ? Activity Tolerance Patient tolerated treatment well   ? ?  ?  ? ?  ? ? ?Past Medical History:  ?Diagnosis Date  ? Anxiety   ? Depression   ? Diabetes mellitus ([place])   ? Dyspnea   ? Heart attack ([place]) 04/11/2007  ? Heart attack ([place])   ? Hyperlipidemia   ? Hypertension   ? Obesity   ? Recurrent UTI   ? Renal lesion   ? Urinary incontinence in female   ? Vaginal atrophy   ? ? ?Past Surgical History:  ?Procedure Laterality Date  ? BREAST BIOPSY Right 03/29/2021  ? stereo bx-calcs, "RIBBON" clip-path pending  ? BUNIONECTOMY  2001  ? CARDIAC CATHETERIZATION    ? COLONOSCOPY WITH PROPOFOL N/A 11/27/2020  ? Procedure: COLONOSCOPY WITH PROPOFOL;  Surgeon: Virgel Manifold, MD;  Location: [facility] ENDOSCOPY;  Service: Endoscopy;  Laterality: N/A;  ? DENTAL SURGERY    ? NECK SURGERY    ? ROTATOR CUFF REPAIR Left   ? ? ?There were no vitals filed for this visit. ? ? Subjective Assessment - 06/26/21 1621   ? ? Subjective "Dr Nicolasa Ducking doesn't take my insurance"   ? Currently in Pain? No/denies   ? ?  ?  ? ?  ? ? ? ? ? ? ? ? ADULT SLP TREATMENT - 06/26/21 0001   ? ?  ? Cognitive-Linquistic Treatment  ? Treatment focused on Cognition;Patient/family/caregiver education   ? Skilled Treatment Skilled treatment session focused on  pt's cognitive impairments with focus on recall, visuospatial organization and strategy generation for problem solving.      ? ?SLP facilitated session by providing the following interventions:      ? ?Deductive Reasoning: SLP facilitated strategy generation to accurately solve pattern grids. Pt begam Mod I with strategy use for  3x3 pattern grids and moderate faded to Mod I to solve 4x4 pattern grids. Pt demonstrated great carryover of strategies for improved accuracy and efficiency.   ? ?  ?  ? ?  ? ? ? SLP Education - 06/26/21 1622   ? ? Education Details great carryover of problem solving strategies   ? Person(s) Educated Patient   ? Methods Explanation;Demonstration;Verbal cues   ? Comprehension Verbalized understanding;Returned demonstration   ? ?  ?  ? ?  ? ? ? SLP Short Term Goals - 06/18/21 2137   ? ?  ? SLP SHORT TERM GOAL #1  ? Title With minimal assistance, pt will plan, schedule, and execute activity/assignment/appointment with 80% accuracy.   ? Baseline new goal   ? Time 10   ? Period --   sessions  ? Status New   ?  ?  SLP SHORT TERM GOAL #2  ? Title Pt will recall information after given delay of increasing length (5 to 60 seconds) with 80% accuracy given min cues.   ? Baseline new goal   ? Time 10   ? Period --   sessions  ? Status New   ?  ? SLP SHORT TERM GOAL #3  ? Title With minimal assistance, pt will use external memory aids and compensatory strategies to aid short term recall with 80% accuracy.   ? Baseline new goal   ? Time 10   ? Period --   sessions  ? Status New   ? ?  ?  ? ?  ? ? ? SLP Long Term Goals - 06/18/21 2141   ? ?  ? SLP LONG TERM GOAL #1  ? Title Pt will a) recall and b) demonstrate use of at least three external memory strategies.   ? Baseline new goal   ? Time 12   ? Period Weeks   ? Status New   ? Target Date 09/10/21   ?  ? SLP LONG TERM GOAL #2  ? Title Patient will demonstrate improved cognitive linguistic function for IND completion of ADLs tasks in home/community  environments.   ? Baseline new goal   ? Time 12   ? Period Weeks   ? Status New   ? Target Date 09/10/21   ? ?  ?  ? ?  ? ? ? Plan - 06/26/21 1623   ? ? Clinical Impression Statement Pt presents with improving higher level cognitive deficits both within structured tasks and functional higher level tasks. Prognosis for continued improvement is good, therefore skilled ST intervention continues to be appropriate to improve her deficits to increase functional independence as well as return to work, leisure and community activities.   ? Speech Therapy Frequency 2x / week   ? Duration 12 weeks   ? Treatment/Interventions Cueing hierarchy;Functional tasks;Patient/family education;Compensatory strategies;Internal/external aids;SLP instruction and feedback   ? Potential to Achieve Goals Good   ? SLP Home Exercise Plan provided, see pt instructions section   ? Consulted and Agree with Plan of Care Patient   ? ?  ?  ? ?  ? ? ?Patient will benefit from skilled therapeutic intervention in order to improve the following deficits and impairments:   ?Cognitive communication deficit ? ?Cognitive impairment ? ?Memory deficits ? ? ? ?Problem List ?Patient Active Problem List  ? Diagnosis Date Noted  ? Type 2 diabetes mellitus with microalbuminuria, without long-term current use of insulin ([place]) 06/06/2021  ? Depression, recurrent ([place]) 06/06/2021  ? Cognitive impairment 04/24/2021  ? Memory deficits 03/27/2021  ? Panic attack due to post traumatic stress disorder (PTSD) 03/27/2021  ? Thickened nails 02/19/2021  ? Nail fungus 02/19/2021  ? Medicare annual wellness visit, subsequent 02/19/2021  ? Abnormal mammogram 02/19/2021  ? Acquired hypothyroidism 02/19/2021  ? Need for shingles vaccine 02/19/2021  ? Cough due to bronchospasm 02/19/2021  ? Hypertension associated with diabetes ([place]) 02/19/2021  ? Chronic bilateral low back pain without sciatica 11/09/2020  ? Hyperlipidemia associated with type 2 diabetes mellitus ([place]) 07/24/2014   ? ?Alvera Tourigny B. Rutherford Nail, M.S., CCC-SLP, CBIS ?Speech-Language Pathologist ?Rehabilitation Services ?Office [phone] ? ?[place], [place] ?06/26/2021, 4:26 PM ? ?[facility] ?[place], [place], [address] ?Phone: [phone]   Fax:  [phone] ? ? ?Name: Melissa Arroyo ?MRN: [id number] ?Date of Birth: [birth year] ? ?

## 2021-06-26 NOTE — Patient Instructions (Signed)
Utilize TalkPath Therapy App and complete newly assigned tasks ?

## 2021-06-27 ENCOUNTER — Ambulatory Visit: Payer: Medicare Other

## 2021-06-27 DIAGNOSIS — R2681 Unsteadiness on feet: Secondary | ICD-10-CM | POA: Diagnosis not present

## 2021-06-27 DIAGNOSIS — R4189 Other symptoms and signs involving cognitive functions and awareness: Secondary | ICD-10-CM | POA: Diagnosis not present

## 2021-06-27 DIAGNOSIS — M545 Low back pain, unspecified: Secondary | ICD-10-CM | POA: Diagnosis not present

## 2021-06-27 DIAGNOSIS — M5459 Other low back pain: Secondary | ICD-10-CM | POA: Diagnosis not present

## 2021-06-27 DIAGNOSIS — R413 Other amnesia: Secondary | ICD-10-CM | POA: Diagnosis not present

## 2021-06-27 DIAGNOSIS — R41841 Cognitive communication deficit: Secondary | ICD-10-CM | POA: Diagnosis not present

## 2021-06-27 NOTE — Chronic Care Management (AMB) (Signed)
  Chronic Care Management   Note  06/27/2021 Name: Melissa Arroyo MRN: [id number] DOB: 1950/02/28  Melissa Arroyo is a 71 y.o. year old female who is a primary care patient of [name], FNP. Melissa Arroyo is currently enrolled in care management services. An additional referral for Licensed Clinical SW was placed.   Follow up plan: Telephone appointment with care management team member scheduled for: 07/09/2021  Julian Hy, [place] Management  Direct Dial: [phone]

## 2021-06-28 ENCOUNTER — Ambulatory Visit: Payer: Self-pay | Admitting: *Deleted

## 2021-06-28 NOTE — Telephone Encounter (Signed)
  Chief Complaint: chronic diarrhea Symptoms: diarrhea after eating Frequency: chronic 7 weeks Pertinent Negatives: Patient denies fever, blood in stool Disposition: '[]'$ ED /'[]'$ Urgent Care (no appt availability in office) / '[x]'$ Appointment(In office/virtual)/ '[]'$  [facility]/ '[]'$ Home Care/ '[]'$ Refused Recommended Disposition /'[]'$ [place] [facility]/ '[]'$  Follow-up with PCP Additional Notes: Patient prefers to see PCP- scheduled first available- patient advised UC/ED for changes or worsening symptoms. Patient is going to try liquid diet and then build on that

## 2021-06-28 NOTE — Telephone Encounter (Signed)
Reason for Disposition  [1] MODERATE diarrhea (e.g., 4-6 times / day more than normal) AND [2] present > 48 hours (2 days)  Answer Assessment - Initial Assessment Questions 1. DIARRHEA SEVERITY: "How bad is the diarrhea?" "How many more stools have you had in the past 24 hours than normal?"    - NO DIARRHEA (SCALE 0)   - MILD (SCALE 1-3): Few loose or mushy BMs; increase of 1-3 stools over normal daily number of stools; mild increase in ostomy output.   -  MODERATE (SCALE 4-7): Increase of 4-6 stools daily over normal; moderate increase in ostomy output. * SEVERE (SCALE 8-10; OR 'WORST POSSIBLE'): Increase of 7 or more stools daily over normal; moderate increase in ostomy output; incontinence.     Patient reports diarrhea every time she eats- 2 -5 stools/day 2. ONSET: "When did the diarrhea begin?"      7 weeks 3. BM CONSISTENCY: "How loose or watery is the diarrhea?"      watery 4. VOMITING: "Are you also vomiting?" If Yes, ask: "How many times in the past 24 hours?"      no 5. ABDOMINAL PAIN: "Are you having any abdominal pain?" If Yes, ask: "What does it feel like?" (e.g., crampy, dull, intermittent, constant)      no 6. ABDOMINAL PAIN SEVERITY: If present, ask: "How bad is the pain?"  (e.g., Scale 1-10; mild, moderate, or severe)   - MILD (1-3): doesn't interfere with normal activities, abdomen soft and not tender to touch    - MODERATE (4-7): interferes with normal activities or awakens from sleep, abdomen tender to touch    - SEVERE (8-10): excruciating pain, doubled over, unable to do any normal activities       Bloating and cramping- goes away after BM 7. ORAL INTAKE: If vomiting, "Have you been able to drink liquids?" "How much liquids have you had in the past 24 hours?"     trys to drink water- not drinking as much as she should 8. HYDRATION: "Any signs of dehydration?" (e.g., dry mouth [not just dry lips], too weak to stand, dizziness, new weight loss) "When did you last  urinate?"     weakness 9. EXPOSURE: "Have you traveled to a foreign country recently?" "Have you been exposed to anyone with diarrhea?" "Could you have eaten any food that was spoiled?"     No- thought it was related to metformin  10. ANTIBIOTIC USE: "Are you taking antibiotics now or have you taken antibiotics in the past 2 months?"       no 11. OTHER SYMPTOMS: "Do you have any other symptoms?" (e.g., fever, blood in stool)       no 12. PREGNANCY: "Is there any chance you are pregnant?" "When was your last menstrual period?"  Protocols used: [facility]

## 2021-07-01 ENCOUNTER — Telehealth: Payer: Self-pay | Admitting: Speech Pathology

## 2021-07-01 ENCOUNTER — Encounter: Payer: Medicare Other | Admitting: Speech Pathology

## 2021-07-01 ENCOUNTER — Ambulatory Visit: Payer: Medicare Other | Admitting: Speech Pathology

## 2021-07-01 ENCOUNTER — Telehealth: Payer: Self-pay

## 2021-07-01 NOTE — Telephone Encounter (Signed)
Patient called asking to schedule an appointment with Dr. Shea Evans for Med Management. I returned call with no answer. Left a voicemail stating she can call the office back to schedule and we would need a referral from her provider.

## 2021-07-01 NOTE — Progress Notes (Unsigned)
I,Joseline E Rosas,acting as a scribe for [name], FNP.,have documented all relevant documentation on the behalf of [name], FNP,as directed by  [name], FNP while in the presence of [name], FNP.   Established patient visit   Patient: Melissa Arroyo   DOB: 22-May-1950   71 y.o. Female  MRN: [id number] Visit Date: 07/02/2021  Today's healthcare provider: [name], FNP  Re Introduced to nurse practitioner role and practice setting.  All questions answered.  Discussed provider/patient relationship and expectations.   Chief Complaint  Patient presents with   Diarrhea   Subjective    Diarrhea  This is a chronic problem. The current episode started more than 1 month ago (7 weeks). The problem has been gradually improving. Treatments tried: imodium and feels better. No more Diarrhea since Friday. The treatment provided moderate relief.   Reports that she stopped Metformin on Saturday.  Wt Readings from Last 3 Encounters:  07/02/21 206 lb 3.2 oz (93.5 kg)  06/06/21 207 lb (93.9 kg)  04/24/21 209 lb (94.8 kg)     Medications: Outpatient Medications Prior to Visit  Medication Sig   ACCU-CHEK GUIDE test strip USE TO TEST ONCE DAILY   Alcohol Swabs (ALCOHOL PREP) PADS Use 1 pad prior to checking blood sugar to clean skin   atorvastatin (LIPITOR) 40 MG tablet TAKE 1 TABLET(40 MG) BY MOUTH DAILY   b complex vitamins capsule Take 1 capsule by mouth daily.   blood glucose meter kit and supplies Dispense based on patient and insurance preference. Check glucose once daily. (FOR ICD-10 E11.29).   buPROPion (WELLBUTRIN XL) 300 MG 24 hr tablet Take 300 mg by mouth every morning.   chlorhexidine (PERIDEX) 0.12 % solution SMARTSIG:By Mouth   Cholecalciferol (VITAMIN D3) 10 MCG (400 UNIT) tablet Take by mouth daily. Dose unknown   cyclobenzaprine (FLEXERIL) 5 MG tablet Take 1 tablet (5 mg total) by mouth 3 (three) times daily as needed for muscle spasms (back pain,  start with PM dosing).   dicyclomine (BENTYL) 10 MG capsule Take 1 capsule (10 mg total) by mouth 4 (four) times daily -  before meals and at bedtime.   donepezil (ARICEPT) 5 MG tablet Take 1 tablet by mouth daily.   estradiol (ESTRACE) 0.1 MG/GM vaginal cream Place 1 Applicatorful vaginally at bedtime.   FLUoxetine (PROZAC) 20 MG capsule Take 3 capsules (60 mg total) by mouth daily.   FLUoxetine (PROZAC) 40 MG capsule Take 40 mg by mouth daily.   fluticasone (FLONASE SENSIMIST) 27.5 MCG/SPRAY nasal spray Place 1-2 sprays into the nose daily.   glipiZIDE (GLUCOTROL XL) 10 MG 24 hr tablet TAKE 1 TABLET(10 MG) BY MOUTH DAILY WITH BREAKFAST   Glucosamine-Chondroitin (MOVE FREE PO) Take 1 tablet by mouth daily.   ibuprofen (ADVIL,MOTRIN) 200 MG tablet Take 400 mg by mouth at bedtime. As needed   lisinopril (ZESTRIL) 5 MG tablet Take 1-2 tablets PO daily  based on home BP readings; Goal BP is <130/<80; Please let your provider know if your blood pressure is consistently above this goal.   meloxicam (MOBIC) 7.5 MG tablet Take 2 tablets (15 mg total) by mouth daily. Avoid other NSAIDs, including Ibuprofen and Naproxen. Take with food/milk.   Multiple Vitamins-Minerals (CENTRUM SILVER PO) Take 1 tablet by mouth daily.   OLANZapine (ZYPREXA) 5 MG tablet Take 7.5 mg by mouth at bedtime.   pioglitazone (ACTOS) 30 MG tablet TAKE 1 TABLET(30 MG) BY MOUTH DAILY  propranolol (INDERAL) 10 MG tablet TAKE 1 TABLET(10 MG) BY MOUTH TWICE DAILY   zinc gluconate 50 MG tablet Take by mouth daily. Dose unknown   metFORMIN (GLUCOPHAGE-XR) 500 MG 24 hr tablet Take 2 tablets (1,000 mg total) by mouth 2 (two) times daily after a meal. (Patient not taking: Reported on 07/02/2021)   No facility-administered medications prior to visit.    Review of Systems  Gastrointestinal:  Positive for diarrhea.      Objective    BP 140/62 (BP Location: Right Arm, Patient Position: Sitting, Cuff Size: Large)   Pulse 88   Temp 97.8  F (36.6 C) (Oral)   Resp 16   Ht '5\' 5"'  (1.651 m)   Wt 206 lb 3.2 oz (93.5 kg)   BMI 34.31 kg/m    Physical Exam Vitals and nursing note reviewed.  Constitutional:      General: She is not in acute distress.    Appearance: Normal appearance. She is obese. She is not ill-appearing, toxic-appearing or diaphoretic.  HENT:     Head: Normocephalic and atraumatic.  Cardiovascular:     Rate and Rhythm: Normal rate and regular rhythm.     Pulses: Normal pulses.     Heart sounds: Normal heart sounds. No murmur heard.   No friction rub. No gallop.  Pulmonary:     Effort: Pulmonary effort is normal. No respiratory distress.     Breath sounds: Normal breath sounds. No stridor. No wheezing, rhonchi or rales.  Chest:     Chest wall: No tenderness.  Abdominal:     General: Bowel sounds are normal.     Palpations: Abdomen is soft.  Musculoskeletal:        General: No swelling, tenderness, deformity or signs of injury. Normal range of motion.     Right lower leg: No edema.     Left lower leg: No edema.  Skin:    General: Skin is warm and dry.     Capillary Refill: Capillary refill takes less than 2 seconds.     Coloration: Skin is not jaundiced or pale.     Findings: No bruising, erythema, lesion or rash.  Neurological:     General: No focal deficit present.     Mental Status: She is alert and oriented to person, place, and time. Mental status is at baseline.     Cranial Nerves: No cranial nerve deficit.     Sensory: No sensory deficit.     Motor: No weakness.     Coordination: Coordination normal.  Psychiatric:        Mood and Affect: Mood is anxious and depressed.        Behavior: Behavior normal.        Thought Content: Thought content normal.        Judgment: Judgment normal.      No results found for any visits on 07/02/21.  Assessment & Plan     Problem List Items Addressed This Visit       Digestive   Diarrhea due to malabsorption - Primary    Acute, improving;  denies systemic side effects or recent travel or illness Has stopped her metformin Currently is taking imodium to assist Will slowing add back metformin with imodium to see if it can be tolerated Advised mid-meal dosing          Endocrine   Type 2 diabetes mellitus with microalbuminuria, without long-term current use of insulin (HCC)    Chronic, stable Last A1c <8%  On Metformin 1000 mg BID- currently held d/t GI side effects actos 30 mg Glipizide 10 mg Continue to recommend balanced, lower carb meals. Smaller meal size, adding snacks. Choosing water as drink of choice and increasing purposeful exercise.          Other   Depression, recurrent ([place])    Chronic, stable Request referral to psych/wishes to see Dr Shea Evans Was confined to house d/t GI side effects from metformin        Relevant Orders   Ambulatory referral to Psychiatry     Return in about 2 months (around 09/01/2021) for chonic disease management.      Vonna Kotyk, FNP, have reviewed all documentation for this visit. The documentation on 07/02/21 for the exam, diagnosis, procedures, and orders are all accurate and complete.    [name], [place] [phone] (phone) [phone] (fax)  [place]

## 2021-07-01 NOTE — Telephone Encounter (Signed)
Pt left message cancelling her therapy appt. I reached out to pt to check on her well-being and to offer any assistance that she might need.   She is currently experiencing stomach issues with MD appt made.   Confirmed next scheduled appt with pt.   Leanda Padmore B. Rutherford Nail M.S., [place], [place] Office [phone]

## 2021-07-02 ENCOUNTER — Ambulatory Visit: Payer: Medicare Other

## 2021-07-02 ENCOUNTER — Ambulatory Visit (INDEPENDENT_AMBULATORY_CARE_PROVIDER_SITE_OTHER): Payer: Medicare Other | Admitting: Family Medicine

## 2021-07-02 ENCOUNTER — Encounter: Payer: Self-pay | Admitting: Family Medicine

## 2021-07-02 VITALS — BP 140/62 | HR 88 | Temp 97.8°F | Resp 16 | Ht 65.0 in | Wt 206.2 lb

## 2021-07-02 DIAGNOSIS — R4189 Other symptoms and signs involving cognitive functions and awareness: Secondary | ICD-10-CM | POA: Diagnosis not present

## 2021-07-02 DIAGNOSIS — R2681 Unsteadiness on feet: Secondary | ICD-10-CM | POA: Diagnosis not present

## 2021-07-02 DIAGNOSIS — M5459 Other low back pain: Secondary | ICD-10-CM

## 2021-07-02 DIAGNOSIS — E1129 Type 2 diabetes mellitus with other diabetic kidney complication: Secondary | ICD-10-CM

## 2021-07-02 DIAGNOSIS — F339 Major depressive disorder, recurrent, unspecified: Secondary | ICD-10-CM | POA: Diagnosis not present

## 2021-07-02 DIAGNOSIS — R197 Diarrhea, unspecified: Secondary | ICD-10-CM | POA: Diagnosis not present

## 2021-07-02 DIAGNOSIS — R413 Other amnesia: Secondary | ICD-10-CM | POA: Diagnosis not present

## 2021-07-02 DIAGNOSIS — K909 Intestinal malabsorption, unspecified: Secondary | ICD-10-CM | POA: Diagnosis not present

## 2021-07-02 DIAGNOSIS — R41841 Cognitive communication deficit: Secondary | ICD-10-CM | POA: Diagnosis not present

## 2021-07-02 DIAGNOSIS — M545 Low back pain, unspecified: Secondary | ICD-10-CM | POA: Diagnosis not present

## 2021-07-02 DIAGNOSIS — R809 Proteinuria, unspecified: Secondary | ICD-10-CM | POA: Diagnosis not present

## 2021-07-02 NOTE — Assessment & Plan Note (Signed)
Chronic, stable Last A1c <8% On Metformin 1000 mg BID- currently held d/t GI side effects actos 30 mg Glipizide 10 mg Continue to recommend balanced, lower carb meals. Smaller meal size, adding snacks. Choosing water as drink of choice and increasing purposeful exercise.

## 2021-07-02 NOTE — Assessment & Plan Note (Signed)
Chronic, stable Request referral to psych/wishes to see Dr Shea Evans Was confined to house d/t GI side effects from metformin

## 2021-07-02 NOTE — Therapy (Unsigned)
OUTPATIENT PHYSICAL THERAPY THORACOLUMBAR EVALUATION   Patient Name: Melissa Arroyo MRN: [id number] DOB:02/26/1950, 71 y.o., female Today's Date: 07/03/2021   PT End of Session - 07/03/21 2119     Visit Number 1    Number of Visits 17    Date for PT Re-Evaluation 08/27/21    Authorization Type eval: 07/02/21    PT Start Time 1315    PT Stop Time 1400    PT Time Calculation (min) 45 min    Activity Tolerance Patient tolerated treatment well    Behavior During Therapy [facility] for tasks assessed/performed              Past Medical History:  Diagnosis Date   Anxiety    Depression    Diabetes mellitus ([place])    Dyspnea    Heart attack ([place]) 04/11/2007   Heart attack ([place])    Hyperlipidemia    Hypertension    Obesity    Recurrent UTI    Renal lesion    Urinary incontinence in female    Vaginal atrophy    Past Surgical History:  Procedure Laterality Date   BREAST BIOPSY Right 03/29/2021   stereo bx-calcs, "RIBBON" clip-path pending   BUNIONECTOMY  2001   CARDIAC CATHETERIZATION     COLONOSCOPY WITH PROPOFOL N/A 11/27/2020   Procedure: COLONOSCOPY WITH PROPOFOL;  Surgeon: Virgel Manifold, MD;  Location: [facility] ENDOSCOPY;  Service: Endoscopy;  Laterality: N/A;   DENTAL SURGERY     NECK SURGERY     ROTATOR CUFF REPAIR Left    Patient Active Problem List   Diagnosis Date Noted   Diarrhea due to malabsorption 07/02/2021   Type 2 diabetes mellitus with microalbuminuria, without long-term current use of insulin ([place]) 06/06/2021   Depression, recurrent ([place]) 06/06/2021   Cognitive impairment 04/24/2021   Memory deficits 03/27/2021   Panic attack due to post traumatic stress disorder (PTSD) 03/27/2021   Thickened nails 02/19/2021   Nail fungus 02/19/2021   Medicare annual wellness visit, subsequent 02/19/2021   Abnormal mammogram 02/19/2021   Acquired hypothyroidism 02/19/2021   Need for shingles vaccine 02/19/2021   Cough due to bronchospasm 02/19/2021    Hypertension associated with diabetes ([place]) 02/19/2021   Chronic bilateral low back pain without sciatica 11/09/2020   Hyperlipidemia associated with type 2 diabetes mellitus ([place]) 07/24/2014    PCP: [name], FNP  REFERRING PROVIDER: [name], FNP  REFERRING DIAGNOSIS: M54.50,G89.29 (ICD-10-CM) - Chronic bilateral low back pain without sciatica   THERAPY DIAG: Other low back pain  RATIONALE FOR EVALUATION AND TREATMENT: Rehabilitation  ONSET DATE: 12/11/21 (approximate)  FOLLOW UP APPT WITH PROVIDER: No. Pt advised to call if needed for this issue   SUBJECTIVE:  Chief Complaint: Chronic bilateral low back pain  Pertinent History Pt reports ongoing chronic low back pain for the last 6 months. Insidious onset without any known trauma however onset did coincide with patient's decline in activity/function. Pain has been unchanged since onset. She reports that her back has always been painful with gardening and heavy lifting however it has become more problematic recently. She is unable to garden, ride her bicycle, or do household chores because of the pain. She reports history of chronic R hip pain and feels like it wants to "give out" on her occasionally. She denies any plain films or MRI of her lumbar spine. Pain does not radiate and she denies LE numbness or tingling.   Pain:  Pain Intensity: Present: 0/10, Best: 0/10, Worst: 7/10 Pain location: bilateral low back Pain Quality: aching  Radiating: No  Numbness/Tingling: No Focal Weakness: No Aggravating factors: Doing the dishes, sweeping, carrying groceries; Relieving factors: sitting, stretches (forward and side-bending), ibuprofen (never filled Mobic), hasn't tried Tylenol, never filled the Flexeril;  24-hour pain behavior: No pattern How  long can you sit: no restriction How long can you stand: 2-3 minutes; History of prior back injury, pain, surgery, or therapy: No, however pt does report intermittent low back pain in the past with heavy lifting.  Falls: Has patient fallen in last 6 months? No Follow-up appointment with MD: No, advised to call if needed Dominant hand: right Imaging: No  Prior level of function: Independent Occupational demands: Not working Hobbies: gardening, walking, riding bicycle,  Red flags (bowel/bladder changes, saddle paresthesia, personal history of cancer, h/o spinal tumors, h/o compression fx, h/o abdominal aneurysm, abdominal pain, chills/fever, night sweats, nausea, vomiting, unrelenting pain, first onset of insidious LBP <20 y/o): Negative  Precautions: None  Weight Bearing Restrictions: No  Patient Goals: Complete housework, lift boxes, and garden without back pain;     OBJECTIVE:   Patient Surveys  Modified Oswestry 40%  FOTO 47, predicted improvement to 47  Cognition Patient is oriented to person, place, and time.  Recent memory is intact.  Remote memory is intact.  Attention span and concentration are intact.  Expressive speech is intact.  Facial expressions are very flat. She is currently undergoing cognitive rehabilitation with SLP; Patient's fund of knowledge is within normal limits for educational level.     Gross Musculoskeletal Assessment Tremor: None Bulk: Normal Tone: Normal No visible step-off along spinal column, no signs of scoliosis  GAIT: Pt ambulates with wide base of support and decreased floor to toe clearance with some mild foot slap LLE. Forward head and rounded shoulders with forward trunk flexion during gait.   Posture: Lumbar lordosis: WNL Iliac crest height: Equal bilaterally Lumbar lateral shift: Negative Forward head and rounded shoulders in sitting and standing   AROM  AROM (Normal range in degrees) AROM  07/03/2021  Lumbar   Flexion  (65) 50% loss  Extension (30) 50% loss  Right lateral flexion (25) 50% loss  Left lateral flexion (25) 50% loss  Right rotation (30) 50% loss  Left rotation (30) 50% loss      Hip Right Left  Flexion (125) WNL WNL  Extension (15) Limited Limited  Abduction (40) WNL WNL  Adduction  WNL WNL  Internal Rotation (45) 25* 20*  External Rotation (45) 50 50      Knee    Flexion (135) WNL WNL  Extension (0) WNL WNL      Ankle    Dorsiflexion (20)    Plantarflexion (50)  Inversion (35)    Eversion (15    (* = pain; Blank rows = not tested)   LE MMT:  MMT (out of 5) Right 05/28/2021 Left 05/28/2021  Hip flexion 4 4  Hip extension 4- 4-  Hip abduction  4- 4-  Hip adduction  4 4  Hip internal rotation 5 5  Hip external rotation 5 5  Knee flexion 4- 4-  Knee extension  5 5  Ankle dorsiflexion 4+ 4+  Ankle plantarflexion Active Active  Ankle inversion    Ankle eversion    (* = pain; Blank rows = not tested)  Sensation Grossly intact to light touch bilateral LEs as determined by testing dermatomes L2-S2. Proprioception, and hot/cold testing deferred on this date.   Reflexes Deferred   Muscle Length Hamstrings: R: Positive for restriction at 60 degrees L: Positive for restriction at 60 degrees [place] (quadriceps): R: Positive for restriction L: Positive for restriction Thomas (hip flexors): R: Not examined L: Not examined Ober: R: Not examined L: Not examined   Palpation  Location LEFT  RIGHT           Lumbar paraspinals 2 2  Quadratus Lumborum 2 2  Gluteus Maximus 0 0  Gluteus Medius 0 0  Deep hip external rotators 0 0  PSIS 1 0  Fortin's Area (SIJ) 0 0  Greater Trochanter 0 0  (Blank rows = not tested) Graded on 0-4 scale (0 = no pain, 1 = pain, 2 = pain with wincing/grimacing/flinching, 3 = pain with withdrawal, 4 = unwilling to allow palpation), (Blank rows = not tested)   Passive Accessory Intervertebral Motion Pt denies reproduction of back pain with CPA  L1-L5. Mild reproduction of back pain with UPA bilaterally L1-L5 but hard to assess mobility due to pain with submaximal pressure. Generally, hypomobile throughout lumbar spine with CPA.     SPECIAL TESTS Lumbar Radiculopathy and Discogenic: Centralization and Peripheralization (SN 92, -LR 0.12): Negative Slump (SN 83, -LR 0.32): R: Negative L: Negative SLR (SN 92, -LR 0.29): R: Negative L:  Negative Crossed SLR (SP 90): R: Negative L: Negative  Facet Joint: Extension-Rotation (SN 100, -LR 0.0): R: Negative L: Negative  Lumbar Foraminal Stenosis: Lumbar quadrant (SN 70): R: Negative L: Negative  Hip: FABER (SN 81): R: Negative L: Negative FADIR (SN 94): R: Negative L: Negative Hip scour (SN 50): R: Positive L: Positive Figure 4: R: Negative; L: Positive  SIJ:  Thigh Thrust (SN 88, -LR 0.18) : R: Not examined L: Not examined  Piriformis Syndrome: FAIR Test (SN 88, SP 83): R: Not examined L: Not examined  Functional Tasks: Deferred  Beighton scale: Deferred   PATIENT EDUCATION:  Education details: Plan of care  Person educated: Patient Education method: Explanation Education comprehension: verbalized understanding   HOME EXERCISE PROGRAM: None currently   ASSESSMENT:  CLINICAL IMPRESSION: Patient is a 71 y.o. female who was seen today for physical therapy evaluation and treatment for back pain. Objective impairments include Abnormal gait, decreased ROM, decreased strength, and pain. She has limited lumbar spine range of motion in all directions as well as limited bilateral hip internal rotation. Decreased strength in bilateral hip abduction, adduction, and extension as well as knee flexion. Pain with palpation to bilateral lumbar paraspinals . These impairments are limiting patient from cleaning, laundry, community activity, and yard work. Personal factors including Age, Past/current experiences, Time since onset of injury/illness/exacerbation, and 3+ comorbidities:  depression, anxiety, DM, and MI  are also affecting patient's functional outcome.  Patient will benefit from skilled PT to address above impairments and improve overall function.  REHAB POTENTIAL: Good  CLINICAL DECISION MAKING: Stable/uncomplicated  EVALUATION COMPLEXITY: Moderate   GOALS: Goals reviewed with patient? Yes  SHORT TERM GOALS: Target date: 07/23/2021  Pt will be independent with HEP to improve strength and decrease back pain to improve pain-free function at home and work. Baseline:  Goal status: INITIAL   LONG TERM GOALS: Target date: 08/13/2021  Pt will increase FOTO to at least 58 to demonstrate significant improvement in function at home and work related to back pain  Baseline: 07/02/21: 47 Goal status: INITIAL  2.  Pt will decrease worst back pain by at least 2 points on the NPRS in order to demonstrate clinically significant reduction in back pain. Baseline: 07/02/21: worst: 7/10 Goal status: INITIAL  3.  Pt will decrease mODI score by at least 13 points in order demonstrate clinically significant reduction in back pain/disability.       Baseline: 07/02/21: 40% Goal status: INITIAL   PLAN: PT FREQUENCY: 2x/week  PT DURATION: 6 weeks  PLANNED INTERVENTIONS: Therapeutic exercises, Therapeutic activity, Neuromuscular re-education, Balance training, Gait training, Patient/Family education, Joint manipulation, Joint mobilization, Canalith repositioning, Aquatic Therapy, Dry Needling, Cognitive remediation, Electrical stimulation, Spinal manipulation, Spinal mobilization, Cryotherapy, Moist heat, Traction, Ultrasound, Ionotophoresis '4mg'$ /ml Dexamethasone, and Manual therapy  PLAN FOR NEXT SESSION: Thomas test to assess hip flexors, initiate manual techniques, initiate stretching/strengthening, issue HEP   Lyndel Safe Iyesha Such PT, DPT, GCS  Christianne Zacher 07/03/2021, 9:20 PM

## 2021-07-02 NOTE — Assessment & Plan Note (Signed)
Acute, improving; denies systemic side effects or recent travel or illness Has stopped her metformin Currently is taking imodium to assist Will slowing add back metformin with imodium to see if it can be tolerated Advised mid-meal dosing

## 2021-07-03 ENCOUNTER — Ambulatory Visit (INDEPENDENT_AMBULATORY_CARE_PROVIDER_SITE_OTHER): Payer: Medicare Other | Admitting: Licensed Clinical Social Worker

## 2021-07-03 ENCOUNTER — Ambulatory Visit: Payer: Medicare Other | Admitting: Speech Pathology

## 2021-07-03 DIAGNOSIS — R4189 Other symptoms and signs involving cognitive functions and awareness: Secondary | ICD-10-CM

## 2021-07-03 DIAGNOSIS — M545 Low back pain, unspecified: Secondary | ICD-10-CM | POA: Diagnosis not present

## 2021-07-03 DIAGNOSIS — F4323 Adjustment disorder with mixed anxiety and depressed mood: Secondary | ICD-10-CM

## 2021-07-03 DIAGNOSIS — R2681 Unsteadiness on feet: Secondary | ICD-10-CM | POA: Diagnosis not present

## 2021-07-03 DIAGNOSIS — R413 Other amnesia: Secondary | ICD-10-CM | POA: Diagnosis not present

## 2021-07-03 DIAGNOSIS — R41841 Cognitive communication deficit: Secondary | ICD-10-CM | POA: Diagnosis not present

## 2021-07-03 DIAGNOSIS — M5459 Other low back pain: Secondary | ICD-10-CM | POA: Diagnosis not present

## 2021-07-03 NOTE — Progress Notes (Signed)
  THERAPIST PROGRESS NOTE  Session Time: 310-4p  ARPA in office visit for patient and LCSW clinician  Participation Level: Active  Behavioral Response: NeatAlertAnxious and Depressed  Type of Therapy: Individual Therapy  Treatment Goals addressed: Problem: Depression CCP Problem  1 Decrease depressive symptoms and improve levels of effective functioning  Goal: LTG: Reduce frequency, intensity, and duration of depression symptoms as evidenced by: SSB input needed on appropriate metric--pt self report Outcome: Progressing Note: Pt reports fewer depression symptoms than at last session Intervention: REVIEW PLEASE SKILLS (TREAT PHYSICAL ILLNESS, BALANCE EATING, AVOID MOOD-ALTERING SUBSTANCES, BALANCE SLEEP AND GET EXERCISE) WITH Manuela Schwartz Intervention: Encourage verbalization of feelings/concerns/expectations   Problem: Anxiety Disorder CCP Problem  1 Reduce overall frequency, intensity, and duration of the anxiety so that daily functioning is not impaired.  Goal: LTG: Patient will score less than 5 on the Generalized Anxiety Disorder 7 Scale (GAD-7) Note: Pt reports fewer anxiety symptoms than at last session Intervention: Encourage patient to identify triggers Intervention: Assist with relaxation techniques, as appropriate (deep breathing exercises, meditation, guided imagery)   ProgressTowards Goals: Progressing  Interventions: CBT (see above)  Summary: Melissa Arroyo is a 71 y.o. female who presents with improving symptoms related to depression. Pt reports that overall mood has been stable recently.     Allowed pt to explore and express thoughts and feelings associated with recent life situations and external stressors. Discussed stress associated with relationship w/ husband.  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 Pt states that her husband isn't really helping out with the bills and is controlling because  they only have one car. Pt feels that there is imbalance within their relationship.  Discussed ways to maintain reciprocity and balance within a relationship.   Discussed pts physical/medical concerns and current supports pt is engaged with.   Continued recommendations are as follows: self care behaviors, positive social engagements, focusing on overall work/home/life balance, and focusing on positive physical and emotional wellness.  .   Suicidal/Homicidal: No  Therapist Response: Pt is continuing to apply interventions learned in session into daily life situations. Pt is currently on track to meet goals utilizing interventions mentioned above. Personal growth and progress noted. Treatment to continue as indicated.   Plan: Return again in 4 weeks.  Diagnosis: No diagnosis found.  Collaboration of Care: Other Pt encouraged to continue psychiatric care with Dr. Shea Evans and continue with neuropsychiatry follow ups  Patient/Guardian was advised Release of Information must be obtained prior to any record release in order to collaborate their care with an outside provider. Patient/Guardian was advised if they have not already done so to contact the registration department to sign all necessary forms in order for [place] to release information regarding their care.   Consent: Patient/Guardian gives verbal consent for treatment and assignment of benefits for services provided during this visit. Patient/Guardian expressed understanding and agreed to proceed.   [place], LCSW 07/03/2021

## 2021-07-04 ENCOUNTER — Ambulatory Visit: Payer: Medicare Other

## 2021-07-04 DIAGNOSIS — M5459 Other low back pain: Secondary | ICD-10-CM | POA: Diagnosis not present

## 2021-07-04 DIAGNOSIS — M545 Low back pain, unspecified: Secondary | ICD-10-CM | POA: Diagnosis not present

## 2021-07-04 DIAGNOSIS — R4189 Other symptoms and signs involving cognitive functions and awareness: Secondary | ICD-10-CM | POA: Diagnosis not present

## 2021-07-04 DIAGNOSIS — R413 Other amnesia: Secondary | ICD-10-CM | POA: Diagnosis not present

## 2021-07-04 DIAGNOSIS — R41841 Cognitive communication deficit: Secondary | ICD-10-CM | POA: Diagnosis not present

## 2021-07-04 DIAGNOSIS — R2681 Unsteadiness on feet: Secondary | ICD-10-CM | POA: Diagnosis not present

## 2021-07-04 NOTE — Patient Instructions (Signed)
Continue completing the [facility] Therapy App assignments

## 2021-07-04 NOTE — Therapy (Signed)
OUTPATIENT PHYSICAL THERAPY THORACOLUMBAR TREATMENT   Patient Name: Melissa Arroyo MRN: [id number] DOB:1950/09/02, 71 y.o., female Today's Date: 07/04/2021   PT End of Session - 07/04/21 1436     Visit Number 2    Number of Visits 17    Date for PT Re-Evaluation 08/27/21    Authorization Type eval: 07/02/21    PT Start Time 4034    PT Stop Time 1438    PT Time Calculation (min) 41 min    Activity Tolerance Patient tolerated treatment well    Behavior During Therapy [facility] for tasks assessed/performed               Past Medical History:  Diagnosis Date   Anxiety    Depression    Diabetes mellitus ([place])    Dyspnea    Heart attack ([place]) 04/11/2007   Heart attack ([place])    Hyperlipidemia    Hypertension    Obesity    Recurrent UTI    Renal lesion    Urinary incontinence in female    Vaginal atrophy    Past Surgical History:  Procedure Laterality Date   BREAST BIOPSY Right 03/29/2021   stereo bx-calcs, "RIBBON" clip-path pending   BUNIONECTOMY  2001   CARDIAC CATHETERIZATION     COLONOSCOPY WITH PROPOFOL N/A 11/27/2020   Procedure: COLONOSCOPY WITH PROPOFOL;  Surgeon: Virgel Manifold, MD;  Location: [facility] ENDOSCOPY;  Service: Endoscopy;  Laterality: N/A;   DENTAL SURGERY     NECK SURGERY     ROTATOR CUFF REPAIR Left    Patient Active Problem List   Diagnosis Date Noted   Diarrhea due to malabsorption 07/02/2021   Type 2 diabetes mellitus with microalbuminuria, without long-term current use of insulin ([place]) 06/06/2021   Depression, recurrent ([place]) 06/06/2021   Cognitive impairment 04/24/2021   Memory deficits 03/27/2021   Panic attack due to post traumatic stress disorder (PTSD) 03/27/2021   Thickened nails 02/19/2021   Nail fungus 02/19/2021   Medicare annual wellness visit, subsequent 02/19/2021   Abnormal mammogram 02/19/2021   Acquired hypothyroidism 02/19/2021   Need for shingles vaccine 02/19/2021   Cough due to bronchospasm 02/19/2021    Hypertension associated with diabetes ([place]) 02/19/2021   Chronic bilateral low back pain without sciatica 11/09/2020   Hyperlipidemia associated with type 2 diabetes mellitus ([place]) 07/24/2014    PCP: [name], FNP  REFERRING PROVIDER: [name], FNP  REFERRING DIAGNOSIS: M54.50,G89.29 (ICD-10-CM) - Chronic bilateral low back pain without sciatica   THERAPY DIAG: Other low back pain  RATIONALE FOR EVALUATION AND TREATMENT: Rehabilitation  ONSET DATE: 12/11/21 (approximate)  FOLLOW UP APPT WITH PROVIDER: No. Pt advised to call if needed for this issue   FROM INITIAL EVALUATION  SUBJECTIVE:  Chief Complaint: Chronic bilateral low back pain  Pertinent History Pt reports ongoing chronic low back pain for the last 6 months. Insidious onset without any known trauma however onset did coincide with patient's decline in activity/function. Pain has been unchanged since onset. She reports that her back has always been painful with gardening and heavy lifting however it has become more problematic recently. She is unable to garden, ride her bicycle, or do household chores because of the pain. She reports history of chronic R hip pain and feels like it wants to "give out" on her occasionally. She denies any plain films or MRI of her lumbar spine. Pain does not radiate and she denies LE numbness or tingling.   Pain:  Pain Intensity: Present: 0/10, Best: 0/10, Worst: 7/10 Pain location: bilateral low back Pain Quality: aching  Radiating: No  Numbness/Tingling: No Focal Weakness: No Aggravating factors: Doing the dishes, sweeping, carrying groceries; Relieving factors: sitting, stretches (forward and side-bending), ibuprofen (never filled Mobic), hasn't tried Tylenol, never filled the Flexeril;  24-hour  pain behavior: No pattern How long can you sit: no restriction How long can you stand: 2-3 minutes; History of prior back injury, pain, surgery, or therapy: No, however pt does report intermittent low back pain in the past with heavy lifting.  Falls: Has patient fallen in last 6 months? No Follow-up appointment with MD: No, advised to call if needed Dominant hand: right Imaging: No  Prior level of function: Independent Occupational demands: Not working Hobbies: gardening, walking, riding bicycle,  Red flags (bowel/bladder changes, saddle paresthesia, personal history of cancer, h/o spinal tumors, h/o compression fx, h/o abdominal aneurysm, abdominal pain, chills/fever, night sweats, nausea, vomiting, unrelenting pain, first onset of insidious LBP <20 y/o): Negative  Precautions: None  Weight Bearing Restrictions: No  Patient Goals: Complete housework, lift boxes, and garden without back pain;     OBJECTIVE:   Patient Surveys  Modified Oswestry 40%  FOTO 47, predicted improvement to 55  Cognition Patient is oriented to person, place, and time.  Recent memory is intact.  Remote memory is intact.  Attention span and concentration are intact.  Expressive speech is intact.  Facial expressions are very flat. She is currently undergoing cognitive rehabilitation with SLP; Patient's fund of knowledge is within normal limits for educational level.     Gross Musculoskeletal Assessment Tremor: None Bulk: Normal Tone: Normal No visible step-off along spinal column, no signs of scoliosis  GAIT: Pt ambulates with wide base of support and decreased floor to toe clearance with some mild foot slap LLE. Forward head and rounded shoulders with forward trunk flexion during gait.   Posture: Lumbar lordosis: WNL Iliac crest height: Equal bilaterally Lumbar lateral shift: Negative Forward head and rounded shoulders in sitting and standing   AROM  AROM (Normal range in degrees) AROM   07/04/2021  Lumbar   Flexion (65) 50% loss  Extension (30) 50% loss  Right lateral flexion (25) 50% loss  Left lateral flexion (25) 50% loss  Right rotation (30) 50% loss  Left rotation (30) 50% loss      Hip Right Left  Flexion (125) WNL WNL  Extension (15) Limited Limited  Abduction (40) WNL WNL  Adduction  WNL WNL  Internal Rotation (45) 25* 20*  External Rotation (45) 50 50      Knee    Flexion (135) WNL WNL  Extension (0) WNL WNL      Ankle    Dorsiflexion (20)    Plantarflexion (50)  Inversion (35)    Eversion (15    (* = pain; Blank rows = not tested)   LE MMT:  MMT (out of 5) Right 05/28/2021 Left 05/28/2021  Hip flexion 4 4  Hip extension 4- 4-  Hip abduction  4- 4-  Hip adduction  4 4  Hip internal rotation 5 5  Hip external rotation 5 5  Knee flexion 4- 4-  Knee extension  5 5  Ankle dorsiflexion 4+ 4+  Ankle plantarflexion Active Active  Ankle inversion    Ankle eversion    (* = pain; Blank rows = not tested)  Sensation Grossly intact to light touch bilateral LEs as determined by testing dermatomes L2-S2. Proprioception, and hot/cold testing deferred on this date.   Reflexes Deferred   Muscle Length Hamstrings: R: Positive for restriction at 60 degrees L: Positive for restriction at 60 degrees E[place] (quadriceps): R: Positive for restriction L: Positive for restriction Thomas (hip flexors): R: Not examined L: Not examined Ober: R: Not examined L: Not examined   Palpation  Location LEFT  RIGHT           Lumbar paraspinals 2 2  Quadratus Lumborum 2 2  Gluteus Maximus 0 0  Gluteus Medius 0 0  Deep hip external rotators 0 0  PSIS 1 0  Fortin's Area (SIJ) 0 0  Greater Trochanter 0 0  (Blank rows = not tested) Graded on 0-4 scale (0 = no pain, 1 = pain, 2 = pain with wincing/grimacing/flinching, 3 = pain with withdrawal, 4 = unwilling to allow palpation), (Blank rows = not tested)   Passive Accessory Intervertebral Motion Pt denies  reproduction of back pain with CPA L1-L5. Mild reproduction of back pain with UPA bilaterally L1-L5 but hard to assess mobility due to pain with submaximal pressure. Generally, hypomobile throughout lumbar spine with CPA.     SPECIAL TESTS Lumbar Radiculopathy and Discogenic: Centralization and Peripheralization (SN 92, -LR 0.12): Negative Slump (SN 83, -LR 0.32): R: Negative L: Negative SLR (SN 92, -LR 0.29): R: Negative L:  Negative Crossed SLR (SP 90): R: Negative L: Negative  Facet Joint: Extension-Rotation (SN 100, -LR 0.0): R: Negative L: Negative  Lumbar Foraminal Stenosis: Lumbar quadrant (SN 70): R: Negative L: Negative  Hip: FABER (SN 81): R: Negative L: Negative FADIR (SN 94): R: Negative L: Negative Hip scour (SN 50): R: Positive L: Positive Figure 4: R: Negative; L: Positive  SIJ:  Thigh Thrust (SN 88, -LR 0.18) : R: Not examined L: Not examined  Piriformis Syndrome: FAIR Test (SN 88, SP 83): R: Not examined L: Not examined  Functional Tasks: Deferred  Beighton scale: Deferred   TREATMENT  SUBJECTIVE: Pt reports that she is doing well today. She denies any low back pain upon arrival. She swept the floor yesterday and states that she was very stiff afterwards. No specific questions or concerns currently.   PAIN: None   Manual Therapy  Prone with moist heat pack on lumbars spine during interval history x 5 minutes (3 minutes unbilled); Prone CPA mobilizations L1-L5, grade II, 20s/bout x 3 bouts/level; Prone STM to lumbar paraspinals with effleurage and with Hypervolt; Hamstring stretch 2 x 45s BLE; Attempted single knee to chest stretch however pt is unable to localize stretch in the low back;   Ther-ex  Prone alternating hip extensions 2 x 10 BLE; Hooklying alternating lumbar rocking 2 x 60s; Hooklying ant/post pelvic tilts 2s hold 2 x 10 each direction; Hooklying pelvic tilt with transverse abdominis  contraction and alternating marches x 10; Seated  forward lumbar flexion stretch 3 x 30s;   PATIENT EDUCATION:  Education details: Plan of care  Person educated: Patient Education method: Explanation Education comprehension: verbalized understanding   HOME EXERCISE PROGRAM: Access Code: [email or web address] URL: [email or web address] Date: 07/04/2021 Prepared by: Roxana Hires  Exercises - Supine Pelvic Tilt  - 2 x daily - 7 x weekly - 2 sets - 10 reps - 2s hold - Supine March  - 2 x daily - 7 x weekly - 2 sets - 10 reps - 2s hold - Hooklying Lumbar Rotation  - 2 x daily - 7 x weekly - 2 sets - 10 reps - Seated Flexion Stretch  - 2 x daily - 7 x weekly - 3 reps - 30s hold   ASSESSMENT:  CLINICAL IMPRESSION: Pt demonstrates excellent motivation during session today. Initiated manual techniques for lumbar spine and pt reports decreased pain at the end of the session. Initiated strengthening exercises and HEP issued. Pt encouraged to follow-up as scheduled. She will benefit from PT services to address deficits in range of motion and pain in order to return to full function at home.    REHAB POTENTIAL: Good  CLINICAL DECISION MAKING: Stable/uncomplicated  EVALUATION COMPLEXITY: Moderate   GOALS: Goals reviewed with patient? Yes  SHORT TERM GOALS: Target date: 07/23/2021  Pt will be independent with HEP to improve strength and decrease back pain to improve pain-free function at home and work. Baseline:  Goal status: INITIAL   LONG TERM GOALS: Target date: 08/13/2021  Pt will increase FOTO to at least 58 to demonstrate significant improvement in function at home and work related to back pain  Baseline: 07/02/21: 47 Goal status: INITIAL  2.  Pt will decrease worst back pain by at least 2 points on the NPRS in order to demonstrate clinically significant reduction in back pain. Baseline: 07/02/21: worst: 7/10 Goal status: INITIAL  3.  Pt will decrease mODI score by at least 13 points in order demonstrate clinically  significant reduction in back pain/disability.       Baseline: 07/02/21: 40% Goal status: INITIAL   PLAN: PT FREQUENCY: 2x/week  PT DURATION: 6 weeks  PLANNED INTERVENTIONS: Therapeutic exercises, Therapeutic activity, Neuromuscular re-education, Balance training, Gait training, Patient/Family education, Joint manipulation, Joint mobilization, Canalith repositioning, Aquatic Therapy, Dry Needling, Cognitive remediation, Electrical stimulation, Spinal manipulation, Spinal mobilization, Cryotherapy, Moist heat, Traction, Ultrasound, Ionotophoresis '4mg'$ /ml Dexamethasone, and Manual therapy  PLAN FOR NEXT SESSION: Continue manual techniques, stretching, and strengthening; review and modify HEP   Lyndel Safe Talmadge Ganas PT, DPT, GCS  Kaydyn Sayas 07/04/2021, 2:39 PM

## 2021-07-04 NOTE — Therapy (Signed)
[place] MAIN [facility] SERVICES [address] [place], [place], [address] Phone: [phone]   Fax:  [phone]  Speech Language Pathology Treatment  Patient Details  Name: Melissa Arroyo MRN: [id number] Date of Birth: November 15, 1950 Referring Provider (SLP): Maudry Diego Candyce Churn   Encounter Date: 07/03/2021   End of Session - 07/04/21 1218     Visit Number 6    Number of Visits 25    Date for SLP Re-Evaluation 09/10/21    Authorization Type Medicare A/B    Authorization Time Period 06/12/2021 thru 09/10/2021    Authorization - Visit Number 5    Progress Note Due on Visit 10    SLP Start Time 1400    SLP Stop Time  1500    SLP Time Calculation (min) 60 min    Activity Tolerance Patient tolerated treatment well             Past Medical History:  Diagnosis Date   Anxiety    Depression    Diabetes mellitus ([place])    Dyspnea    Heart attack ([place]) 04/11/2007   Heart attack ([place])    Hyperlipidemia    Hypertension    Obesity    Recurrent UTI    Renal lesion    Urinary incontinence in female    Vaginal atrophy     Past Surgical History:  Procedure Laterality Date   BREAST BIOPSY Right 03/29/2021   stereo bx-calcs, "RIBBON" clip-path pending   BUNIONECTOMY  2001   CARDIAC CATHETERIZATION     COLONOSCOPY WITH PROPOFOL N/A 11/27/2020   Procedure: COLONOSCOPY WITH PROPOFOL;  Surgeon: Virgel Manifold, MD;  Location: [facility] ENDOSCOPY;  Service: Endoscopy;  Laterality: N/A;   DENTAL SURGERY     NECK SURGERY     ROTATOR CUFF REPAIR Left     There were no vitals filed for this visit.   Subjective Assessment - 07/04/21 1158     Subjective "I feel better"    Currently in Pain? No/denies                   ADULT SLP TREATMENT - 07/04/21 0001       Cognitive-Linquistic Treatment   Treatment focused on Cognition;Patient/family/caregiver education    Skilled Treatment Skilled treatment session focused on pt's cognitive  impairments with focus on recall, visuospatial organization and strategy generation for problem solving.       SLP facilitated session by providing the following interventions:       Constant Therapy Clinician App utilized to target auditory processing, Specifically, FOLLOW INSTRUCTIONS THAT YOU HEAR. Level 2 - 90% with intermittent replay of instructions; Level 3 - 25% improving to 80% with max faded to moderate cues to employ strategy such as visualization.   Pt also able to recall rules of previously learned card game [facility] in [employer]) with rare minimum cues.              SLP Education - 07/04/21 1217     Education Details divided attention, visualization as a problem solving strategy    Person(s) Educated Patient    Methods Explanation;Demonstration;Verbal cues    Comprehension Verbalized understanding;Returned demonstration;Need further instruction              SLP Short Term Goals - 06/18/21 2137       SLP SHORT TERM GOAL #1   Title With minimal assistance, pt will plan, schedule, and execute activity/assignment/appointment with 80% accuracy.    Baseline new  goal    Time 10    Period --   sessions   Status New      SLP SHORT TERM GOAL #2   Title Pt will recall information after given delay of increasing length (5 to 60 seconds) with 80% accuracy given min cues.    Baseline new goal    Time 10    Period --   sessions   Status New      SLP SHORT TERM GOAL #3   Title With minimal assistance, pt will use external memory aids and compensatory strategies to aid short term recall with 80% accuracy.    Baseline new goal    Time 10    Period --   sessions   Status New              SLP Long Term Goals - 06/18/21 2141       SLP LONG TERM GOAL #1   Title Pt will a) recall and b) demonstrate use of at least three external memory strategies.    Baseline new goal    Time 12    Period Weeks    Status New    Target Date 09/10/21      SLP LONG TERM GOAL #2    Title Patient will demonstrate improved cognitive linguistic function for IND completion of ADLs tasks in home/community environments.    Baseline new goal    Time 12    Period Weeks    Status New    Target Date 09/10/21              Plan - 07/04/21 1228     Clinical Impression Statement Pt presents with improving higher level cognitive deficits both within structured tasks and functional higher level tasks. Today, pt struggled with dividing attention between task and an emotional situation that occurred immediately prior to session. However, prognosis for continued improvement is good, therefore skilled ST intervention continues to be appropriate to improve her deficits to increase functional independence as well as return to work, leisure and community activities.    Speech Therapy Frequency 2x / week    Duration 12 weeks    Treatment/Interventions Cueing hierarchy;Functional tasks;Patient/family education;Compensatory strategies;Internal/external aids;SLP instruction and feedback    Potential to Achieve Goals Good    Potential Considerations Previous level of function;Severity of impairments;Cooperation/participation level    SLP Home Exercise Plan provided, see pt instructions section    Consulted and Agree with Plan of Care Patient             Patient will benefit from skilled therapeutic intervention in order to improve the following deficits and impairments:   Cognitive communication deficit  Cognitive impairment  Memory deficits    Problem List Patient Active Problem List   Diagnosis Date Noted   Diarrhea due to malabsorption 07/02/2021   Type 2 diabetes mellitus with microalbuminuria, without long-term current use of insulin ([place]) 06/06/2021   Depression, recurrent ([place]) 06/06/2021   Cognitive impairment 04/24/2021   Memory deficits 03/27/2021   Panic attack due to post traumatic stress disorder (PTSD) 03/27/2021   Thickened nails 02/19/2021   Nail fungus  02/19/2021   Medicare annual wellness visit, subsequent 02/19/2021   Abnormal mammogram 02/19/2021   Acquired hypothyroidism 02/19/2021   Need for shingles vaccine 02/19/2021   Cough due to bronchospasm 02/19/2021   Hypertension associated with diabetes ([place]) 02/19/2021   Chronic bilateral low back pain without sciatica 11/09/2020   Hyperlipidemia associated with type 2 diabetes mellitus (  [place]) 07/24/2014   Willy Pinkerton B. Rutherford Nail M.S., CCC-SLP, [place] Pathologist Certified Brain Injury [place]  [facility] [phone] Ascom [phone] Fax [phone]  Clarisa Fling 07/04/2021, 12:29 PM  [place] MAIN [facility] SERVICES [address] [place], [place], [address] Phone: [phone]   Fax:  [phone]   Name: KAROLE OO MRN: [id number] Date of Birth: 05/09/1950

## 2021-07-09 ENCOUNTER — Ambulatory Visit (INDEPENDENT_AMBULATORY_CARE_PROVIDER_SITE_OTHER): Payer: Medicare Other | Admitting: *Deleted

## 2021-07-09 DIAGNOSIS — F4310 Post-traumatic stress disorder, unspecified: Secondary | ICD-10-CM

## 2021-07-09 DIAGNOSIS — E1129 Type 2 diabetes mellitus with other diabetic kidney complication: Secondary | ICD-10-CM

## 2021-07-09 DIAGNOSIS — E1159 Type 2 diabetes mellitus with other circulatory complications: Secondary | ICD-10-CM

## 2021-07-09 DIAGNOSIS — I152 Hypertension secondary to endocrine disorders: Secondary | ICD-10-CM

## 2021-07-09 DIAGNOSIS — Z0000 Encounter for general adult medical examination without abnormal findings: Secondary | ICD-10-CM

## 2021-07-09 DIAGNOSIS — F339 Major depressive disorder, recurrent, unspecified: Secondary | ICD-10-CM

## 2021-07-09 NOTE — Chronic Care Management (AMB) (Cosign Needed Addendum)
Chronic Care Management    Clinical Social Work Note  07/09/2021 Name: Melissa Arroyo MRN: [id number] DOB: [birth year]  Melissa Arroyo is a 71 y.o. year old female who is a primary care patient of [name], FNP. The CCM team was consulted to assist the patient with chronic disease management and/or care coordination needs related to: Mental Health Counseling and Resources.   Engaged with patient by telephone for initial visit in response to provider referral for social work chronic care management and care coordination services.   Consent to Services:  The patient was given the following information about Chronic Care Management services today, agreed to services, and gave verbal consent: 1. CCM service includes personalized support from designated clinical staff supervised by the primary care provider, including individualized plan of care and coordination with other care providers 2. 24/7 contact phone numbers for assistance for urgent and routine care needs. 3. Service will only be billed when office clinical staff spend 20 minutes or more in a month to coordinate care. 4. Only one practitioner may furnish and bill the service in a calendar month. 5.The patient may stop CCM services at any time (effective at the end of the month) by phone call to the office staff. 6. The patient will be responsible for cost sharing (co-pay) of up to 20% of the service fee (after annual deductible is met). Patient agreed to services and consent obtained.  Patient agreed to services and consent obtained.   Assessment: Review of patient past medical history, allergies, medications, and health status, including review of relevant consultants reports was performed today as part of a comprehensive evaluation and provision of chronic care management and care coordination services.     SDOH (Social Determinants of Health) assessments and interventions performed:  SDOH Interventions    Flowsheet Row Most Recent  Value  SDOH Interventions   SDOH Interventions for the Following Domains Depression  Depression Interventions/Treatment  Currently on Treatment        Advanced Directives Status: Not addressed in this encounter.  CCM Care Plan  Allergies  Allergen Reactions   Vesicare [Solifenacin] Anaphylaxis    Throat Closed   Penicillins Rash    Outpatient Encounter Medications as of 07/09/2021  Medication Sig   ACCU-CHEK GUIDE test strip USE TO TEST ONCE DAILY   Alcohol Swabs (ALCOHOL PREP) PADS Use 1 pad prior to checking blood sugar to clean skin   atorvastatin (LIPITOR) 40 MG tablet TAKE 1 TABLET(40 MG) BY MOUTH DAILY   b complex vitamins capsule Take 1 capsule by mouth daily.   blood glucose meter kit and supplies Dispense based on patient and insurance preference. Check glucose once daily. (FOR ICD-10 E11.29).   buPROPion (WELLBUTRIN XL) 300 MG 24 hr tablet Take 300 mg by mouth every morning.   chlorhexidine (PERIDEX) 0.12 % solution SMARTSIG:By Mouth   Cholecalciferol (VITAMIN D3) 10 MCG (400 UNIT) tablet Take by mouth daily. Dose unknown   cyclobenzaprine (FLEXERIL) 5 MG tablet Take 1 tablet (5 mg total) by mouth 3 (three) times daily as needed for muscle spasms (back pain, start with PM dosing).   dicyclomine (BENTYL) 10 MG capsule Take 1 capsule (10 mg total) by mouth 4 (four) times daily -  before meals and at bedtime.   donepezil (ARICEPT) 5 MG tablet Take 1 tablet by mouth daily.   estradiol (ESTRACE) 0.1 MG/GM vaginal cream Place 1 Applicatorful vaginally at bedtime.   FLUoxetine (PROZAC) 20 MG capsule Take 3 capsules (60 mg  total) by mouth daily.   FLUoxetine (PROZAC) 40 MG capsule Take 40 mg by mouth daily.   fluticasone (FLONASE SENSIMIST) 27.5 MCG/SPRAY nasal spray Place 1-2 sprays into the nose daily.   glipiZIDE (GLUCOTROL XL) 10 MG 24 hr tablet TAKE 1 TABLET(10 MG) BY MOUTH DAILY WITH BREAKFAST   Glucosamine-Chondroitin (MOVE FREE PO) Take 1 tablet by mouth daily.    ibuprofen (ADVIL,MOTRIN) 200 MG tablet Take 400 mg by mouth at bedtime. As needed   lisinopril (ZESTRIL) 5 MG tablet Take 1-2 tablets PO daily  based on home BP readings; Goal BP is <130/<80; Please let your provider know if your blood pressure is consistently above this goal.   meloxicam (MOBIC) 7.5 MG tablet Take 2 tablets (15 mg total) by mouth daily. Avoid other NSAIDs, including Ibuprofen and Naproxen. Take with food/milk.   metFORMIN (GLUCOPHAGE-XR) 500 MG 24 hr tablet Take 2 tablets (1,000 mg total) by mouth 2 (two) times daily after a meal. (Patient not taking: Reported on 07/02/2021)   Multiple Vitamins-Minerals (CENTRUM SILVER PO) Take 1 tablet by mouth daily.   OLANZapine (ZYPREXA) 5 MG tablet Take 7.5 mg by mouth at bedtime.   pioglitazone (ACTOS) 30 MG tablet TAKE 1 TABLET(30 MG) BY MOUTH DAILY   propranolol (INDERAL) 10 MG tablet TAKE 1 TABLET(10 MG) BY MOUTH TWICE DAILY   zinc gluconate 50 MG tablet Take by mouth daily. Dose unknown   No facility-administered encounter medications on file as of 07/09/2021.    Patient Active Problem List   Diagnosis Date Noted   Diarrhea due to malabsorption 07/02/2021   Type 2 diabetes mellitus with microalbuminuria, without long-term current use of insulin ([place]) 06/06/2021   Depression, recurrent ([place]) 06/06/2021   Cognitive impairment 04/24/2021   Memory deficits 03/27/2021   Panic attack due to post traumatic stress disorder (PTSD) 03/27/2021   Thickened nails 02/19/2021   Nail fungus 02/19/2021   Medicare annual wellness visit, subsequent 02/19/2021   Abnormal mammogram 02/19/2021   Acquired hypothyroidism 02/19/2021   Need for shingles vaccine 02/19/2021   Cough due to bronchospasm 02/19/2021   Hypertension associated with diabetes ([place]) 02/19/2021   Chronic bilateral low back pain without sciatica 11/09/2020   Hyperlipidemia associated with type 2 diabetes mellitus ([place]) 07/24/2014    Conditions to be addressed/monitored: Anxiety  and Depression; Mental Health Concerns   Care Plan : General Social Work (Adult)  Updates made by Vern Claude, LCSW since 07/09/2021 12:00 AM     Problem: CHL AMB "PATIENT-SPECIFIC PROBLEM"   Note:   CARE PLAN ENTRY (see longitudinal plan of care for additional care plan information)  Current Barriers:  Knowledge deficits related to accessing mental health provider in patient with Anxiety and Depression  Patient is experiencing symptoms of  depression and anxiety  which seem to be exacerbated by workplace trauma, relationship challenges and grief.     Patient needs Support, Education, and Care Coordination in order to meet unmet mental health needs  Mental Health Concerns   Clinical Social Work Goal(s):  Over the next 90 days, patient will work with SW bi-weekly by telephone or in person to reduce or manage symptoms of anxiety and depression until connected for ongoing counseling resources. (Patient currently in treatment, however only being seen every 6 weeks(approximately) Patient would like to see of there are available services in the community that can follow up with Patient will continue to implement clinical interventions discussed with regular therapist to decreases symptoms of anxiety and depression and increase  knowledge and/or ability of: coping skills.  Interventions:  Assessed patient's understanding, education, previous treatment and care coordination needs  Patient interviewed and appropriate assessments performed: PHQ 2 PHQ 9 Provided basic mental health support, education and interventions related to her anxiety and depression Patient confirmed that work stress has improved with the approval of long term disability-patient also discussed receiving PT and Speech therapy finding it beneficial Discussed being currently active with [facility] and plans on continuing, however would like additional options for outpatient therapy to be seen bi-weekly  Follow up with therapist currently scheduled for  08/29/21 and Psychiatrist 07/30/21 Collaborated with appropriate clinical care team members regarding patient needs Discussed options for long term counseling based on need and insurance, including remaining with current therapist-this social worker agreeable to identifying options if needed in the future Other interventions include: Motivational Interviewing employed Solution-Focused Strategies employed:  Active listening / Reflection utilized  Emotional Support Provided   Patient Self Care Activities & Deficits:  Patient is unable to independently navigate community resource options without care coordination support Patient is able to implement clinical interventions discussed today and is motivated for treatment  Patient will select one of the agencies from the list provided and call to schedule an appointmen Performs ADL's independently Performs IADL's independently  Initial goal documentation       Follow Up Plan: SW will follow up with patient by phone over the next 14 business days       Bray, [place] Worker  [facility] Care Management [phone]

## 2021-07-09 NOTE — Patient Instructions (Addendum)
Visit Information  Thank you for taking time to visit with me today. Please don't hesitate to contact me if I can be of assistance to you before our next scheduled telephone appointment.  Following are the goals we discussed today:   - continue active participation in personal counseling - talk about feelings with a friend, family or spiritual advisor - practice positive thinking and self-talk     Our next appointment is by telephone on 07/16/21 at [facility]  Please call the care guide team at [phone] if you need to cancel or reschedule your appointment.   If you are experiencing a Mental Health or [place] or need someone to talk to, please call the Suicide and Crisis Lifeline: 988   Following is a copy of your full plan of care:  Care Plan : General Social Work (Adult)  Updates made by [employer], Darla Lesches, LCSW since 07/09/2021 12:00 AM     Problem: CHL AMB "PATIENT-SPECIFIC PROBLEM"   Note:   CARE PLAN ENTRY (see longitudinal plan of care for additional care plan information)  Current Barriers:  Knowledge deficits related to accessing mental health provider in patient with Anxiety and Depression  Patient is experiencing symptoms of  depression and anxiety  which seem to be exacerbated by workplace trauma, relationship challenges and grief.     Patient needs Support, Education, and Care Coordination in order to meet unmet mental health needs  Mental Health Concerns   Clinical Social Work Goal(s):  Over the next 90 days, patient will work with SW bi-weekly by telephone or in person to reduce or manage symptoms of anxiety and depression until connected for ongoing counseling resources. (Patient currently in treatment, however only being seen every 6 weeks(approximately) Patient would like to see of there are available services in the community that can follow up with Patient will continue to implement clinical interventions discussed with regular therapist to decreases  symptoms of anxiety and depression and increase knowledge and/or ability of: coping skills.  Interventions:  Assessed patient's understanding, education, previous treatment and care coordination needs  Patient interviewed and appropriate assessments performed: PHQ 2 PHQ 9 Provided basic mental health support, education and interventions related to her anxiety and depression Patient confirmed that work stress has improved with the approval of long term disability-patient also discussed receiving PT and Speech therapy finding it beneficial Discussed being currently active with [facility] and plans on continuing, however would like additional options for outpatient therapy to be seen bi-weekly Follow up with therapist currently scheduled for  08/29/21 and Psychiatrist 07/30/21 Collaborated with appropriate clinical care team members regarding patient needs Discussed options for long term counseling based on need and insurance, including remaining with current therapist-this social worker agreeable to identifying options if needed in the future Other interventions include: Motivational Interviewing employed Solution-Focused Strategies employed:  Active listening / Reflection utilized  Emotional Support Provided   Patient Self Care Activities & Deficits:  Patient is unable to independently navigate community resource options without care coordination support Patient is able to implement clinical interventions discussed today and is motivated for treatment  Patient will select one of the agencies from the list provided and call to schedule an appointmen Performs ADL's independently Performs IADL's independently  Initial goal documentation      Ms. Hiscox was given information about Care Management services by the embedded care coordination team including:  Care Management services include personalized support from designated clinical staff supervised by her physician,  including individualized  plan of care and coordination with other care providers 24/7 contact phone numbers for assistance for urgent and routine care needs. The patient may stop CCM services at any time (effective at the end of the month) by phone call to the office staff.  Patient agreed to services and verbal consent obtained.   Patient verbalizes understanding of instructions and care plan provided today and agrees to view in [facility]. Active MyChart status and patient understanding of how to access instructions and care plan via MyChart confirmed with patient.     Telephone follow up appointment with care management team member scheduled for: 07/16/21   Elliot Gurney, [place] Worker  [facility] Care [facility] [phone]

## 2021-07-10 ENCOUNTER — Ambulatory Visit: Payer: Medicare Other | Admitting: Speech Pathology

## 2021-07-10 DIAGNOSIS — E1129 Type 2 diabetes mellitus with other diabetic kidney complication: Secondary | ICD-10-CM

## 2021-07-10 DIAGNOSIS — M545 Low back pain, unspecified: Secondary | ICD-10-CM | POA: Diagnosis not present

## 2021-07-10 DIAGNOSIS — R4189 Other symptoms and signs involving cognitive functions and awareness: Secondary | ICD-10-CM

## 2021-07-10 DIAGNOSIS — R41841 Cognitive communication deficit: Secondary | ICD-10-CM | POA: Diagnosis not present

## 2021-07-10 DIAGNOSIS — R809 Proteinuria, unspecified: Secondary | ICD-10-CM

## 2021-07-10 DIAGNOSIS — F339 Major depressive disorder, recurrent, unspecified: Secondary | ICD-10-CM

## 2021-07-10 DIAGNOSIS — I152 Hypertension secondary to endocrine disorders: Secondary | ICD-10-CM

## 2021-07-10 DIAGNOSIS — R2681 Unsteadiness on feet: Secondary | ICD-10-CM | POA: Diagnosis not present

## 2021-07-10 DIAGNOSIS — E1159 Type 2 diabetes mellitus with other circulatory complications: Secondary | ICD-10-CM

## 2021-07-10 DIAGNOSIS — R413 Other amnesia: Secondary | ICD-10-CM | POA: Diagnosis not present

## 2021-07-10 DIAGNOSIS — M5459 Other low back pain: Secondary | ICD-10-CM | POA: Diagnosis not present

## 2021-07-10 NOTE — Therapy (Addendum)
[place] MAIN [facility] SERVICES [address] [place], [place], [address] Phone: [phone]   Fax:  [phone]  Speech Language Pathology Treatment  Patient Details  Name: Melissa Arroyo MRN: [id number] Date of Birth: Jun 28, 1950 Referring Provider (SLP): Maudry Diego Candyce Churn   Encounter Date: 07/10/2021   End of Session - 07/10/21 2110     Visit Number 7             Past Medical History:  Diagnosis Date   Anxiety    Depression    Diabetes mellitus ([place])    Dyspnea    Heart attack ([place]) 04/11/2007   Heart attack ([place])    Hyperlipidemia    Hypertension    Obesity    Recurrent UTI    Renal lesion    Urinary incontinence in female    Vaginal atrophy     Past Surgical History:  Procedure Laterality Date   BREAST BIOPSY Right 03/29/2021   stereo bx-calcs, "RIBBON" clip-path pending   BUNIONECTOMY  2001   CARDIAC CATHETERIZATION     COLONOSCOPY WITH PROPOFOL N/A 11/27/2020   Procedure: COLONOSCOPY WITH PROPOFOL;  Surgeon: Virgel Manifold, MD;  Location: [facility] ENDOSCOPY;  Service: Endoscopy;  Laterality: N/A;   DENTAL SURGERY     NECK SURGERY     ROTATOR CUFF REPAIR Left     There were no vitals filed for this visit.   Subjective Assessment - 07/10/21 2109     Subjective pt pleasant, eager, motivated    Currently in Pain? No/denies                   ADULT SLP TREATMENT - 07/10/21 0001       Cognitive-Linquistic Treatment   Treatment focused on Cognition;Patient/family/caregiver education    Skilled Treatment Skilled treatment session focused on pt's cognitive impairments with focus on recall, visuospatial organization and strategy generation for problem solving.       SLP facilitated session by providing the following interventions:       Moderate assistance to improve task tolerance and work through rather than skip difficult or complex activities on [employer].              SLP Education  - 07/10/21 2109     Education Details cognitive function    Person(s) Educated Patient    Methods Explanation;Demonstration;Verbal cues    Comprehension Verbalized understanding;Returned demonstration;Need further instruction              SLP Short Term Goals - 06/18/21 2137       SLP SHORT TERM GOAL #1   Title With minimal assistance, pt will plan, schedule, and execute activity/assignment/appointment with 80% accuracy.    Baseline new goal    Time 10    Period --   sessions   Status New      SLP SHORT TERM GOAL #2   Title Pt will recall information after given delay of increasing length (5 to 60 seconds) with 80% accuracy given min cues.    Baseline new goal    Time 10    Period --   sessions   Status New      SLP SHORT TERM GOAL #3   Title With minimal assistance, pt will use external memory aids and compensatory strategies to aid short term recall with 80% accuracy.    Baseline new goal    Time 10    Period --   sessions   Status New  SLP Long Term Goals - 06/18/21 2141       SLP LONG TERM GOAL #1   Title Pt will a) recall and b) demonstrate use of at least three external memory strategies.    Baseline new goal    Time 12    Period Weeks    Status New    Target Date 09/10/21      SLP LONG TERM GOAL #2   Title Patient will demonstrate improved cognitive linguistic function for IND completion of ADLs tasks in home/community environments.    Baseline new goal    Time 12    Period Weeks    Status New    Target Date 09/10/21               Patient will benefit from skilled therapeutic intervention in order to improve the following deficits and impairments:   Cognitive impairment    Problem List Patient Active Problem List   Diagnosis Date Noted   Diarrhea due to malabsorption 07/02/2021   Type 2 diabetes mellitus with microalbuminuria, without long-term current use of insulin ([place]) 06/06/2021   Depression, recurrent ([place])  06/06/2021   Cognitive impairment 04/24/2021   Memory deficits 03/27/2021   Panic attack due to post traumatic stress disorder (PTSD) 03/27/2021   Thickened nails 02/19/2021   Arroyo fungus 02/19/2021   Medicare annual wellness visit, subsequent 02/19/2021   Abnormal mammogram 02/19/2021   Acquired hypothyroidism 02/19/2021   Need for shingles vaccine 02/19/2021   Cough due to bronchospasm 02/19/2021   Hypertension associated with diabetes ([place]) 02/19/2021   Chronic bilateral low back pain without sciatica 11/09/2020   Hyperlipidemia associated with type 2 diabetes mellitus ([place]) 07/24/2014   Melissa Arroyo M.S., CCC-SLP, [place] Pathologist Certified Brain Injury [place]  [facility] [phone] Ascom [phone] Fax [phone]  Melissa Arroyo 07/10/2021, 9:49 PM  [place] MAIN [facility] SERVICES [address] [place], [place], [address] Phone: [phone]   Fax:  [phone]   Name: Melissa Arroyo MRN: [id number] Date of Birth: [birth year]

## 2021-07-11 ENCOUNTER — Ambulatory Visit: Payer: Medicare Other | Attending: Family Medicine

## 2021-07-11 DIAGNOSIS — R41841 Cognitive communication deficit: Secondary | ICD-10-CM | POA: Diagnosis not present

## 2021-07-11 DIAGNOSIS — R413 Other amnesia: Secondary | ICD-10-CM | POA: Diagnosis not present

## 2021-07-11 DIAGNOSIS — R4189 Other symptoms and signs involving cognitive functions and awareness: Secondary | ICD-10-CM | POA: Diagnosis not present

## 2021-07-11 DIAGNOSIS — M5459 Other low back pain: Secondary | ICD-10-CM | POA: Insufficient documentation

## 2021-07-11 NOTE — Therapy (Signed)
OUTPATIENT PHYSICAL THERAPY THORACOLUMBAR TREATMENT   Patient Name: Melissa Arroyo MRN: [id number] DOB:12-Nov-1950, 71 y.o., female Today's Date: 07/11/2021   PT End of Session - 07/11/21 1402     Visit Number 3    Number of Visits 17    Date for PT Re-Evaluation 08/27/21    Authorization Type eval: 07/02/21    PT Start Time 1400    PT Stop Time 1445    PT Time Calculation (min) 45 min    Activity Tolerance Patient tolerated treatment well    Behavior During Therapy [facility] for tasks assessed/performed             Past Medical History:  Diagnosis Date   Anxiety    Depression    Diabetes mellitus ([place])    Dyspnea    Heart attack ([place]) 04/11/2007   Heart attack ([place])    Hyperlipidemia    Hypertension    Obesity    Recurrent UTI    Renal lesion    Urinary incontinence in female    Vaginal atrophy    Past Surgical History:  Procedure Laterality Date   BREAST BIOPSY Right 03/29/2021   stereo bx-calcs, "RIBBON" clip-path pending   BUNIONECTOMY  2001   CARDIAC CATHETERIZATION     COLONOSCOPY WITH PROPOFOL N/A 11/27/2020   Procedure: COLONOSCOPY WITH PROPOFOL;  Surgeon: Virgel Manifold, MD;  Location: [facility] ENDOSCOPY;  Service: Endoscopy;  Laterality: N/A;   DENTAL SURGERY     NECK SURGERY     ROTATOR CUFF REPAIR Left     Patient Active Problem List   Diagnosis Date Noted   Diarrhea due to malabsorption 07/02/2021   Type 2 diabetes mellitus with microalbuminuria, without long-term current use of insulin ([place]) 06/06/2021   Depression, recurrent ([place]) 06/06/2021   Cognitive impairment 04/24/2021   Memory deficits 03/27/2021   Panic attack due to post traumatic stress disorder (PTSD) 03/27/2021   Thickened nails 02/19/2021   Nail fungus 02/19/2021   Medicare annual wellness visit, subsequent 02/19/2021   Abnormal mammogram 02/19/2021   Acquired hypothyroidism 02/19/2021   Need for shingles vaccine 02/19/2021   Cough due to bronchospasm 02/19/2021   Hypertension  associated with diabetes ([place]) 02/19/2021   Chronic bilateral low back pain without sciatica 11/09/2020   Hyperlipidemia associated with type 2 diabetes mellitus ([place]) 07/24/2014    PCP: [name], FNP  REFERRING PROVIDER: [name], FNP  REFERRING DIAGNOSIS: M54.50,G89.29 (ICD-10-CM) - Chronic bilateral low back pain without sciatica   THERAPY DIAG: Other low back pain  RATIONALE FOR EVALUATION AND TREATMENT: Rehabilitation  ONSET DATE: 12/11/21 (approximate)  FOLLOW UP APPT WITH PROVIDER: No. Pt advised to call if needed for this issue   FROM INITIAL EVALUATION  SUBJECTIVE:  Chief Complaint: Chronic bilateral low back pain  Pertinent History Pt reports ongoing chronic low back pain for the last 6 months. Insidious onset without any known trauma however onset did coincide with patient's decline in activity/function. Pain has been unchanged since onset. She reports that her back has always been painful with gardening and heavy lifting however it has become more problematic recently. She is unable to garden, ride her bicycle, or do household chores because of the pain. She reports history of chronic R hip pain and feels like it wants to "give out" on her occasionally. She denies any plain films or MRI of her lumbar spine. Pain does not radiate and she denies LE numbness or tingling.   Pain:  Pain Intensity: Present: 0/10, Best: 0/10, Worst: 7/10 Pain location: bilateral low back Pain Quality: aching  Radiating: No  Numbness/Tingling: No Focal Weakness: No Aggravating factors: Doing the dishes, sweeping, carrying groceries; Relieving factors: sitting, stretches (forward and side-bending), ibuprofen (never filled Mobic), hasn't tried Tylenol, never filled the Flexeril;  24-hour pain behavior:  No pattern How long can you sit: no restriction How long can you stand: 2-3 minutes; History of prior back injury, pain, surgery, or therapy: No, however pt does report intermittent low back pain in the past with heavy lifting.  Falls: Has patient fallen in last 6 months? No Follow-up appointment with MD: No, advised to call if needed Dominant hand: right Imaging: No  Prior level of function: Independent Occupational demands: Not working Hobbies: gardening, walking, riding bicycle,  Red flags (bowel/bladder changes, saddle paresthesia, personal history of cancer, h/o spinal tumors, h/o compression fx, h/o abdominal aneurysm, abdominal pain, chills/fever, night sweats, nausea, vomiting, unrelenting pain, first onset of insidious LBP <20 y/o): Negative  Precautions: None  Weight Bearing Restrictions: No  Patient Goals: Complete housework, lift boxes, and garden without back pain;     OBJECTIVE:   Patient Surveys  Modified Oswestry 40%  FOTO 47, predicted improvement to 93  Cognition Patient is oriented to person, place, and time.  Recent memory is intact.  Remote memory is intact.  Attention span and concentration are intact.  Expressive speech is intact.  Facial expressions are very flat. She is currently undergoing cognitive rehabilitation with SLP; Patient's fund of knowledge is within normal limits for educational level.     Gross Musculoskeletal Assessment Tremor: None Bulk: Normal Tone: Normal No visible step-off along spinal column, no signs of scoliosis  GAIT: Pt ambulates with wide base of support and decreased floor to toe clearance with some mild foot slap LLE. Forward head and rounded shoulders with forward trunk flexion during gait.   Posture: Lumbar lordosis: WNL Iliac crest height: Equal bilaterally Lumbar lateral shift: Negative Forward head and rounded shoulders in sitting and standing   AROM  AROM (Normal range in degrees) AROM  07/11/2021   Lumbar   Flexion (65) 50% loss  Extension (30) 50% loss  Right lateral flexion (25) 50% loss  Left lateral flexion (25) 50% loss  Right rotation (30) 50% loss  Left rotation (30) 50% loss      Hip Right Left  Flexion (125) WNL WNL  Extension (15) Limited Limited  Abduction (40) WNL WNL  Adduction  WNL WNL  Internal Rotation (45) 25* 20*  External Rotation (45) 50 50      Knee    Flexion (135) WNL WNL  Extension (0) WNL WNL      Ankle    Dorsiflexion (20)    Plantarflexion (50)  Inversion (35)    Eversion (15    (* = pain; Blank rows = not tested)   LE MMT:  MMT (out of 5) Right 05/28/2021 Left 05/28/2021  Hip flexion 4 4  Hip extension 4- 4-  Hip abduction  4- 4-  Hip adduction  4 4  Hip internal rotation 5 5  Hip external rotation 5 5  Knee flexion 4- 4-  Knee extension  5 5  Ankle dorsiflexion 4+ 4+  Ankle plantarflexion Active Active  Ankle inversion    Ankle eversion    (* = pain; Blank rows = not tested)  Sensation Grossly intact to light touch bilateral LEs as determined by testing dermatomes L2-S2. Proprioception, and hot/cold testing deferred on this date.   Reflexes Deferred   Muscle Length Hamstrings: R: Positive for restriction at 60 degrees L: Positive for restriction at 60 degrees [place] (quadriceps): R: Positive for restriction L: Positive for restriction Thomas (hip flexors): R: Not examined L: Not examined Ober: R: Not examined L: Not examined   Palpation  Location LEFT  RIGHT           Lumbar paraspinals 2 2  Quadratus Lumborum 2 2  Gluteus Maximus 0 0  Gluteus Medius 0 0  Deep hip external rotators 0 0  PSIS 1 0  Fortin's Area (SIJ) 0 0  Greater Trochanter 0 0  (Blank rows = not tested) Graded on 0-4 scale (0 = no pain, 1 = pain, 2 = pain with wincing/grimacing/flinching, 3 = pain with withdrawal, 4 = unwilling to allow palpation), (Blank rows = not tested)   Passive Accessory Intervertebral Motion Pt denies reproduction  of back pain with CPA L1-L5. Mild reproduction of back pain with UPA bilaterally L1-L5 but hard to assess mobility due to pain with submaximal pressure. Generally, hypomobile throughout lumbar spine with CPA.     SPECIAL TESTS Lumbar Radiculopathy and Discogenic: Centralization and Peripheralization (SN 92, -LR 0.12): Negative Slump (SN 83, -LR 0.32): R: Negative L: Negative SLR (SN 92, -LR 0.29): R: Negative L:  Negative Crossed SLR (SP 90): R: Negative L: Negative  Facet Joint: Extension-Rotation (SN 100, -LR 0.0): R: Negative L: Negative  Lumbar Foraminal Stenosis: Lumbar quadrant (SN 70): R: Negative L: Negative  Hip: FABER (SN 81): R: Negative L: Negative FADIR (SN 94): R: Negative L: Negative Hip scour (SN 50): R: Positive L: Positive Figure 4: R: Negative; L: Positive  SIJ:  Thigh Thrust (SN 88, -LR 0.18) : R: Not examined L: Not examined  Piriformis Syndrome: FAIR Test (SN 88, SP 83): R: Not examined L: Not examined  Functional Tasks: Deferred  Beighton scale: Deferred   TREATMENT  SUBJECTIVE: Pt reports that she is doing well today. She denies any low back pain upon arrival. She swept the floor yesterday and states that she was very stiff afterwards. No specific questions or concerns currently.   PAIN: None   Manual Therapy  Prone CPA mobilizations L1-L5, grade II, 20s/bout x 2 bouts/level; Prone STM to lumbar paraspinals with effleurage and with Hypervolt; R hip FABER and FADIR stretch x 45s each;   Ther-ex  NuStep L1 x 5 minutes for warm-up during interval history with moist heat pack on lumbar spine; Prone alternating hip extensions 2 x 10 BLE; Hooklying alternating lumbar rocking 2 x 60s; Hooklying ant/post pelvic tilts 2s hold x 10 each direction; Supine alternating heel taps 2 x 5 BLE;  Seated forward lumbar flexion stretch 3 x 30s with added L lateral  flexion bias to stretch R low back;   Trigger Point Dry Needling (TDN), unbilled Education  performed with patient regarding potential benefit of TDN. Reviewed precautions and risks with patient. Pt provided verbal consent to treatment. In prone using clean technique TDN performed to R lumbar multifidi at L3 with 1, 0.30 x 60 single needle placements with deep ache reported. Pistoning technique utilized.   PATIENT EDUCATION:  Education details: Plan of care, Pt educated throughout session about proper posture and technique with exercises. Improved exercise technique, movement at target joints, use of target muscles after min to mod verbal, visual, tactile cues.  Person educated: Patient Education method: Explanation Education comprehension: verbalized understanding   HOME EXERCISE PROGRAM: Access Code: [email or web address] URL: [email or web address] Date: 07/04/2021 Prepared by: Roxana Hires  Exercises - Supine Pelvic Tilt  - 2 x daily - 7 x weekly - 2 sets - 10 reps - 2s hold - Supine March  - 2 x daily - 7 x weekly - 2 sets - 10 reps - 2s hold - Hooklying Lumbar Rotation  - 2 x daily - 7 x weekly - 2 sets - 10 reps - Seated Flexion Stretch  - 2 x daily - 7 x weekly - 3 reps - 30s hold   ASSESSMENT:  CLINICAL IMPRESSION: Pt demonstrates excellent motivation during session today. Continued manual techniques for lumbar spine and performed TDN to R lumbar multifidi. Progressed strengthening exercises and HEP reinforced. Pt encouraged to follow-up as scheduled. She will benefit from PT services to address deficits in range of motion and pain in order to return to full function at home.    REHAB POTENTIAL: Good  CLINICAL DECISION MAKING: Stable/uncomplicated  EVALUATION COMPLEXITY: Moderate   GOALS: Goals reviewed with patient? Yes  SHORT TERM GOALS: Target date: 07/23/2021  Pt will be independent with HEP to improve strength and decrease back pain to improve pain-free function at home and work. Baseline:  Goal status: INITIAL   LONG TERM GOALS: Target date:  08/13/2021  Pt will increase FOTO to at least 58 to demonstrate significant improvement in function at home and work related to back pain  Baseline: 07/02/21: 47 Goal status: INITIAL  2.  Pt will decrease worst back pain by at least 2 points on the NPRS in order to demonstrate clinically significant reduction in back pain. Baseline: 07/02/21: worst: 7/10 Goal status: INITIAL  3.  Pt will decrease mODI score by at least 13 points in order demonstrate clinically significant reduction in back pain/disability.       Baseline: 07/02/21: 40% Goal status: INITIAL   PLAN: PT FREQUENCY: 2x/week  PT DURATION: 6 weeks  PLANNED INTERVENTIONS: Therapeutic exercises, Therapeutic activity, Neuromuscular re-education, Balance training, Gait training, Patient/Family education, Joint manipulation, Joint mobilization, Canalith repositioning, Aquatic Therapy, Dry Needling, Cognitive remediation, Electrical stimulation, Spinal manipulation, Spinal mobilization, Cryotherapy, Moist heat, Traction, Ultrasound, Ionotophoresis '4mg'$ /ml Dexamethasone, and Manual therapy  PLAN FOR NEXT SESSION: Continue manual techniques, stretching, and strengthening; review and modify HEP as needed.   Lyndel Safe Montel Vanderhoof PT, DPT, GCS  Nevia Henkin 07/11/2021, 4:34 PM

## 2021-07-15 ENCOUNTER — Ambulatory Visit: Payer: Medicare Other | Admitting: Speech Pathology

## 2021-07-15 DIAGNOSIS — R4189 Other symptoms and signs involving cognitive functions and awareness: Secondary | ICD-10-CM | POA: Diagnosis not present

## 2021-07-15 DIAGNOSIS — M5459 Other low back pain: Secondary | ICD-10-CM | POA: Diagnosis not present

## 2021-07-15 DIAGNOSIS — R41841 Cognitive communication deficit: Secondary | ICD-10-CM | POA: Diagnosis not present

## 2021-07-15 DIAGNOSIS — R413 Other amnesia: Secondary | ICD-10-CM

## 2021-07-16 ENCOUNTER — Ambulatory Visit (INDEPENDENT_AMBULATORY_CARE_PROVIDER_SITE_OTHER): Payer: Medicare Other | Admitting: *Deleted

## 2021-07-16 ENCOUNTER — Telehealth: Payer: Medicare Other

## 2021-07-16 ENCOUNTER — Ambulatory Visit: Payer: Medicare Other

## 2021-07-16 DIAGNOSIS — E1129 Type 2 diabetes mellitus with other diabetic kidney complication: Secondary | ICD-10-CM

## 2021-07-16 DIAGNOSIS — R41841 Cognitive communication deficit: Secondary | ICD-10-CM | POA: Diagnosis not present

## 2021-07-16 DIAGNOSIS — F339 Major depressive disorder, recurrent, unspecified: Secondary | ICD-10-CM

## 2021-07-16 DIAGNOSIS — M5459 Other low back pain: Secondary | ICD-10-CM

## 2021-07-16 DIAGNOSIS — R413 Other amnesia: Secondary | ICD-10-CM | POA: Diagnosis not present

## 2021-07-16 DIAGNOSIS — E1159 Type 2 diabetes mellitus with other circulatory complications: Secondary | ICD-10-CM

## 2021-07-16 DIAGNOSIS — R4189 Other symptoms and signs involving cognitive functions and awareness: Secondary | ICD-10-CM | POA: Diagnosis not present

## 2021-07-16 NOTE — Patient Instructions (Signed)
Visit Information  Thank you for taking time to visit with me today. Please don't hesitate to contact me if I can be of assistance to you before our next scheduled telephone appointment.  Following are the goals we discussed today:  ontinue active participation in personal counseling - talk about feelings with a friend, family or spiritual advisor - practice positive thinking and self-talk     Please call the care guide team at [phone] if you need to cancel or reschedule your appointment.   If you are experiencing a Mental Health or [place] or need someone to talk to, please call the Suicide and Crisis Lifeline: 988   Patient verbalizes understanding of instructions and care plan provided today and agrees to view in [place]. Active MyChart status and patient understanding of how to access instructions and care plan via MyChart confirmed with patient.     No further follow up required: patient will contact this Education officer, museum with any additional community resource needs   [employer], [place] Worker  [facility] Care Management [phone]

## 2021-07-16 NOTE — Therapy (Signed)
OUTPATIENT PHYSICAL THERAPY THORACOLUMBAR TREATMENT/DISCHARGE   Patient Name: Melissa Arroyo MRN: [id number] DOB:Jan 15, 1951, 71 y.o., female Today's Date: 07/16/2021   PT End of Session - 07/16/21 1356     Visit Number 4    Number of Visits 17    Date for PT Re-Evaluation 08/27/21    Authorization Type eval: 07/02/21    PT Start Time 1400    PT Stop Time 1445    PT Time Calculation (min) 45 min    Activity Tolerance Patient tolerated treatment well    Behavior During Therapy [facility] for tasks assessed/performed              Past Medical History:  Diagnosis Date   Anxiety    Depression    Diabetes mellitus ([place])    Dyspnea    Heart attack ([place]) 04/11/2007   Heart attack ([place])    Hyperlipidemia    Hypertension    Obesity    Recurrent UTI    Renal lesion    Urinary incontinence in female    Vaginal atrophy    Past Surgical History:  Procedure Laterality Date   BREAST BIOPSY Right 03/29/2021   stereo bx-calcs, "RIBBON" clip-path pending   BUNIONECTOMY  2001   CARDIAC CATHETERIZATION     COLONOSCOPY WITH PROPOFOL N/A 11/27/2020   Procedure: COLONOSCOPY WITH PROPOFOL;  Surgeon: Virgel Manifold, MD;  Location: [facility] ENDOSCOPY;  Service: Endoscopy;  Laterality: N/A;   DENTAL SURGERY     NECK SURGERY     ROTATOR CUFF REPAIR Left     Patient Active Problem List   Diagnosis Date Noted   Diarrhea due to malabsorption 07/02/2021   Type 2 diabetes mellitus with microalbuminuria, without long-term current use of insulin ([place]) 06/06/2021   Depression, recurrent ([place]) 06/06/2021   Cognitive impairment 04/24/2021   Memory deficits 03/27/2021   Panic attack due to post traumatic stress disorder (PTSD) 03/27/2021   Thickened nails 02/19/2021   Nail fungus 02/19/2021   Medicare annual wellness visit, subsequent 02/19/2021   Abnormal mammogram 02/19/2021   Acquired hypothyroidism 02/19/2021   Need for shingles vaccine 02/19/2021   Cough due to bronchospasm 02/19/2021    Hypertension associated with diabetes ([place]) 02/19/2021   Chronic bilateral low back pain without sciatica 11/09/2020   Hyperlipidemia associated with type 2 diabetes mellitus ([place]) 07/24/2014    PCP: [name], FNP  REFERRING PROVIDER: [name], FNP  REFERRING DIAGNOSIS: M54.50,G89.29 (ICD-10-CM) - Chronic bilateral low back pain without sciatica   THERAPY DIAG: Other low back pain  RATIONALE FOR EVALUATION AND TREATMENT: Rehabilitation  ONSET DATE: 12/11/21 (approximate)  FOLLOW UP APPT WITH PROVIDER: No. Pt advised to call if needed for this issue   FROM INITIAL EVALUATION  SUBJECTIVE:  Chief Complaint: Chronic bilateral low back pain  Pertinent History Pt reports ongoing chronic low back pain for the last 6 months. Insidious onset without any known trauma however onset did coincide with patient's decline in activity/function. Pain has been unchanged since onset. She reports that her back has always been painful with gardening and heavy lifting however it has become more problematic recently. She is unable to garden, ride her bicycle, or do household chores because of the pain. She reports history of chronic R hip pain and feels like it wants to "give out" on her occasionally. She denies any plain films or MRI of her lumbar spine. Pain does not radiate and she denies LE numbness or tingling.   Pain:  Pain Intensity: Present: 0/10, Best: 0/10, Worst: 7/10 Pain location: bilateral low back Pain Quality: aching  Radiating: No  Numbness/Tingling: No Focal Weakness: No Aggravating factors: Doing the dishes, sweeping, carrying groceries; Relieving factors: sitting, stretches (forward and side-bending), ibuprofen (never filled Mobic), hasn't tried Tylenol, never filled the Flexeril;  24-hour  pain behavior: No pattern How long can you sit: no restriction How long can you stand: 2-3 minutes; History of prior back injury, pain, surgery, or therapy: No, however pt does report intermittent low back pain in the past with heavy lifting.  Falls: Has patient fallen in last 6 months? No Follow-up appointment with MD: No, advised to call if needed Dominant hand: right Imaging: No  Prior level of function: Independent Occupational demands: Not working Hobbies: gardening, walking, riding bicycle,  Red flags (bowel/bladder changes, saddle paresthesia, personal history of cancer, h/o spinal tumors, h/o compression fx, h/o abdominal aneurysm, abdominal pain, chills/fever, night sweats, nausea, vomiting, unrelenting pain, first onset of insidious LBP <20 y/o): Negative  Precautions: None  Weight Bearing Restrictions: No  Patient Goals: Complete housework, lift boxes, and garden without back pain;     OBJECTIVE:   Patient Surveys  Modified Oswestry 40%  FOTO 47, predicted improvement to 48  Cognition Patient is oriented to person, place, and time.  Recent memory is intact.  Remote memory is intact.  Attention span and concentration are intact.  Expressive speech is intact.  Facial expressions are very flat. She is currently undergoing cognitive rehabilitation with SLP; Patient's fund of knowledge is within normal limits for educational level.     Gross Musculoskeletal Assessment Tremor: None Bulk: Normal Tone: Normal No visible step-off along spinal column, no signs of scoliosis  GAIT: Pt ambulates with wide base of support and decreased floor to toe clearance with some mild foot slap LLE. Forward head and rounded shoulders with forward trunk flexion during gait.   Posture: Lumbar lordosis: WNL Iliac crest height: Equal bilaterally Lumbar lateral shift: Negative Forward head and rounded shoulders in sitting and standing   AROM  AROM (Normal range in degrees) AROM   07/16/2021  Lumbar   Flexion (65) 50% loss  Extension (30) 50% loss  Right lateral flexion (25) 50% loss  Left lateral flexion (25) 50% loss  Right rotation (30) 50% loss  Left rotation (30) 50% loss      Hip Right Left  Flexion (125) WNL WNL  Extension (15) Limited Limited  Abduction (40) WNL WNL  Adduction  WNL WNL  Internal Rotation (45) 25* 20*  External Rotation (45) 50 50      Knee    Flexion (135) WNL WNL  Extension (0) WNL WNL      Ankle    Dorsiflexion (20)    Plantarflexion (50)  Inversion (35)    Eversion (15    (* = pain; Blank rows = not tested)   LE MMT:  MMT (out of 5) Right 05/28/2021 Left 05/28/2021  Hip flexion 4 4  Hip extension 4- 4-  Hip abduction  4- 4-  Hip adduction  4 4  Hip internal rotation 5 5  Hip external rotation 5 5  Knee flexion 4- 4-  Knee extension  5 5  Ankle dorsiflexion 4+ 4+  Ankle plantarflexion Active Active  Ankle inversion    Ankle eversion    (* = pain; Blank rows = not tested)  Sensation Grossly intact to light touch bilateral LEs as determined by testing dermatomes L2-S2. Proprioception, and hot/cold testing deferred on this date.   Reflexes Deferred   Muscle Length Hamstrings: R: Positive for restriction at 60 degrees L: Positive for restriction at 60 degrees [place] (quadriceps): R: Positive for restriction L: Positive for restriction Thomas (hip flexors): R: Not examined L: Not examined Ober: R: Not examined L: Not examined   Palpation  Location LEFT  RIGHT           Lumbar paraspinals 2 2  Quadratus Lumborum 2 2  Gluteus Maximus 0 0  Gluteus Medius 0 0  Deep hip external rotators 0 0  PSIS 1 0  Fortin's Area (SIJ) 0 0  Greater Trochanter 0 0  (Blank rows = not tested) Graded on 0-4 scale (0 = no pain, 1 = pain, 2 = pain with wincing/grimacing/flinching, 3 = pain with withdrawal, 4 = unwilling to allow palpation), (Blank rows = not tested)   Passive Accessory Intervertebral Motion Pt denies  reproduction of back pain with CPA L1-L5. Mild reproduction of back pain with UPA bilaterally L1-L5 but hard to assess mobility due to pain with submaximal pressure. Generally, hypomobile throughout lumbar spine with CPA.     SPECIAL TESTS Lumbar Radiculopathy and Discogenic: Centralization and Peripheralization (SN 92, -LR 0.12): Negative Slump (SN 83, -LR 0.32): R: Negative L: Negative SLR (SN 92, -LR 0.29): R: Negative L:  Negative Crossed SLR (SP 90): R: Negative L: Negative  Facet Joint: Extension-Rotation (SN 100, -LR 0.0): R: Negative L: Negative  Lumbar Foraminal Stenosis: Lumbar quadrant (SN 70): R: Negative L: Negative  Hip: FABER (SN 81): R: Negative L: Negative FADIR (SN 94): R: Negative L: Negative Hip scour (SN 50): R: Positive L: Positive Figure 4: R: Negative; L: Positive  SIJ:  Thigh Thrust (SN 88, -LR 0.18) : R: Not examined L: Not examined  Piriformis Syndrome: FAIR Test (SN 88, SP 83): R: Not examined L: Not examined  Functional Tasks: Deferred  Beighton scale: Deferred   TREATMENT  SUBJECTIVE: Pt reports that she is doing well today. She denies any low back pain upon arrival. She has not had any back pain since the last therapy session and reports that she feels ready to discharge from therapy. Pt reports approximately 80% improvement in symptoms since starting with therapy. She has been attentive to improving her posture while doing dishes at home and has noticed a significant improvement. No specific questions or concerns currently.   PAIN: None   Manual Therapy  Prone lumbar PAM assessment with no pain reported STM to lumbar paraspinals with effleurage;   Ther-ex  NuStep L1-2 x 5 minutes for warm-up during interval history with moist heat pack on lumbar spine;  Updated outcome measure with patient: Pt reports 80% improvement in symptoms since starting with therapy; FOTO: 61 mODI: 34% Worst pain:  6/10  Prone alternating hip extensions x 10  BLE; Hooklying anterior/posterior tilts x 10 each direction; Hooklying alternating lumbar rocking x 60s; Hooklying bridges with segmental flexion x 10; Education about proper lifting technique; 20# box lifts from 6" stool to waist height x 7, x 9;    PATIENT EDUCATION:  Education details: Plan of care, Pt educated throughout session about proper posture and technique with exercises. Improved exercise technique, movement at target joints, use of target muscles after min to mod verbal, visual, tactile cues, lifting mechanics, discharge Person educated: Patient Education method: Explanation Education comprehension: verbalized understanding   HOME EXERCISE PROGRAM: Access Code: [email or web address] URL: [email or web address] Date: 07/16/2021 Prepared by: Roxana Hires  Exercises - Supine Pelvic Tilt  - 2 x daily - 7 x weekly - 2 sets - 10 reps - 2s hold - Supine March  - 2 x daily - 7 x weekly - 2 sets - 10 reps - 2s hold - Hooklying Lumbar Rotation  - 2 x daily - 7 x weekly - 2 sets - 10 reps - Mini Squat with Counter Support  - 2 x daily - 7 x weekly - 2 sets - 10 reps - Seated Flexion Stretch  - 2 x daily - 7 x weekly - 3 reps - 30s hold   ASSESSMENT:  CLINICAL IMPRESSION: Pt demonstrates excellent motivation during session today. Overall she reports approximately 80% improvement in her back symptoms since starting with therapy. Update outcome measures with patient today. Her mODI decreased from 40% at initial evaluation to 34% today. Her FOTO score increased from 47 initially to 61 today. Continued manual techniques for lumbar spine but majority of session today focused on strenghtening. Progressed strengthening exercises and education/practice provided for lifting mechanics. HEP updated and reinforced. Pt discharged on this date reporting adequate resolution of her back symptoms.    REHAB POTENTIAL: Good  CLINICAL DECISION MAKING: Stable/uncomplicated  EVALUATION COMPLEXITY:  Moderate   GOALS: Goals reviewed with patient? Yes  SHORT TERM GOALS: Target date: 07/23/2021  Pt will be independent with HEP to improve strength and decrease back pain to improve pain-free function at home and work. Baseline:  Goal status: ACHIEVED   LONG TERM GOALS: Target date: 08/13/2021  Pt will increase FOTO to at least 58 to demonstrate significant improvement in function at home and work related to back pain  Baseline: 07/02/21: 47; 07/16/21: 61 Goal status: ACHIEVED  2.  Pt will decrease worst back pain by at least 2 points on the NPRS in order to demonstrate clinically significant reduction in back pain. Baseline: 07/02/21: worst: 7/10; 07/16/21: worst: 6/10; Goal status: PARTIALLY MET  3.  Pt will decrease mODI score by at least 13 points in order demonstrate clinically significant reduction in back pain/disability.       Baseline: 07/02/21: 40%; 07/16/21: 34% Goal status:  PARTIALLY MET   PLAN: PT FREQUENCY: 2x/week  PT DURATION: 6 weeks  PLANNED INTERVENTIONS: Therapeutic exercises, Therapeutic activity, Neuromuscular re-education, Balance training, Gait training, Patient/Family education, Joint manipulation, Joint mobilization, Canalith repositioning, Aquatic Therapy, Dry Needling, Cognitive remediation, Electrical stimulation, Spinal manipulation, Spinal mobilization, Cryotherapy, Moist heat, Traction, Ultrasound, Ionotophoresis 62m/ml Dexamethasone, and Manual therapy  PLAN FOR NEXT SESSION: Discharge   JLyndel SafeHuprich PT, DPT, GCS  Sherril Heyward 07/16/2021, 3:13 PM

## 2021-07-16 NOTE — Chronic Care Management (AMB) (Signed)
Chronic Care Management    Clinical Social Work Note  07/16/2021 Name: Melissa Arroyo MRN: [id number] DOB: 07/12/1950  Melissa Arroyo is a 71 y.o. year old female who is a primary care patient of [name], FNP. The CCM team was consulted to assist the patient with chronic disease management and/or care coordination needs related to: Mental Health Counseling and Resources.   Engaged with patient by telephone for follow up visit in response to provider referral for social work chronic care management and care coordination services.   Consent to Services:  The patient was given information about Chronic Care Management services, agreed to services, and gave verbal consent prior to initiation of services.  Please see initial visit note for detailed documentation.   Patient agreed to services and consent obtained.   Assessment: Review of patient past medical history, allergies, medications, and health status, including review of relevant consultants reports was performed today as part of a comprehensive evaluation and provision of chronic care management and care coordination services.     SDOH (Social Determinants of Health) assessments and interventions performed:    Advanced Directives Status: Not addressed in this encounter.  CCM Care Plan  Allergies  Allergen Reactions   Vesicare [Solifenacin] Anaphylaxis    Throat Closed   Penicillins Rash    Outpatient Encounter Medications as of 07/16/2021  Medication Sig   ACCU-CHEK GUIDE test strip USE TO TEST ONCE DAILY   Alcohol Swabs (ALCOHOL PREP) PADS Use 1 pad prior to checking blood sugar to clean skin   atorvastatin (LIPITOR) 40 MG tablet TAKE 1 TABLET(40 MG) BY MOUTH DAILY   b complex vitamins capsule Take 1 capsule by mouth daily.   blood glucose meter kit and supplies Dispense based on patient and insurance preference. Check glucose once daily. (FOR ICD-10 E11.29).   buPROPion (WELLBUTRIN XL) 300 MG 24 hr tablet Take 300 mg by  mouth every morning.   chlorhexidine (PERIDEX) 0.12 % solution SMARTSIG:By Mouth   Cholecalciferol (VITAMIN D3) 10 MCG (400 UNIT) tablet Take by mouth daily. Dose unknown   cyclobenzaprine (FLEXERIL) 5 MG tablet Take 1 tablet (5 mg total) by mouth 3 (three) times daily as needed for muscle spasms (back pain, start with PM dosing).   dicyclomine (BENTYL) 10 MG capsule Take 1 capsule (10 mg total) by mouth 4 (four) times daily -  before meals and at bedtime.   donepezil (ARICEPT) 5 MG tablet Take 1 tablet by mouth daily.   estradiol (ESTRACE) 0.1 MG/GM vaginal cream Place 1 Applicatorful vaginally at bedtime.   FLUoxetine (PROZAC) 20 MG capsule Take 3 capsules (60 mg total) by mouth daily.   FLUoxetine (PROZAC) 40 MG capsule Take 40 mg by mouth daily.   fluticasone (FLONASE SENSIMIST) 27.5 MCG/SPRAY nasal spray Place 1-2 sprays into the nose daily.   glipiZIDE (GLUCOTROL XL) 10 MG 24 hr tablet TAKE 1 TABLET(10 MG) BY MOUTH DAILY WITH BREAKFAST   Glucosamine-Chondroitin (MOVE FREE PO) Take 1 tablet by mouth daily.   ibuprofen (ADVIL,MOTRIN) 200 MG tablet Take 400 mg by mouth at bedtime. As needed   lisinopril (ZESTRIL) 5 MG tablet Take 1-2 tablets PO daily  based on home BP readings; Goal BP is <130/<80; Please let your provider know if your blood pressure is consistently above this goal.   meloxicam (MOBIC) 7.5 MG tablet Take 2 tablets (15 mg total) by mouth daily. Avoid other NSAIDs, including Ibuprofen and Naproxen. Take with food/milk.   metFORMIN (GLUCOPHAGE-XR) 500 MG 24  hr tablet Take 2 tablets (1,000 mg total) by mouth 2 (two) times daily after a meal. (Patient not taking: Reported on 07/02/2021)   Multiple Vitamins-Minerals (CENTRUM SILVER PO) Take 1 tablet by mouth daily.   OLANZapine (ZYPREXA) 5 MG tablet Take 7.5 mg by mouth at bedtime.   pioglitazone (ACTOS) 30 MG tablet TAKE 1 TABLET(30 MG) BY MOUTH DAILY   propranolol (INDERAL) 10 MG tablet TAKE 1 TABLET(10 MG) BY MOUTH TWICE DAILY    zinc gluconate 50 MG tablet Take by mouth daily. Dose unknown   No facility-administered encounter medications on file as of 07/16/2021.    Patient Active Problem List   Diagnosis Date Noted   Diarrhea due to malabsorption 07/02/2021   Type 2 diabetes mellitus with microalbuminuria, without long-term current use of insulin ([place]) 06/06/2021   Depression, recurrent ([place]) 06/06/2021   Cognitive impairment 04/24/2021   Memory deficits 03/27/2021   Panic attack due to post traumatic stress disorder (PTSD) 03/27/2021   Thickened nails 02/19/2021   Nail fungus 02/19/2021   Medicare annual wellness visit, subsequent 02/19/2021   Abnormal mammogram 02/19/2021   Acquired hypothyroidism 02/19/2021   Need for shingles vaccine 02/19/2021   Cough due to bronchospasm 02/19/2021   Hypertension associated with diabetes ([place]) 02/19/2021   Chronic bilateral low back pain without sciatica 11/09/2020   Hyperlipidemia associated with type 2 diabetes mellitus ([place]) 07/24/2014    Conditions to be addressed/monitored:  Anxiety and Depression; Mental Health Concerns  Care Plan : General Social Work (Adult)  Updates made by Vern Claude, LCSW since 07/16/2021 12:00 AM     Problem: CHL AMB "PATIENT-SPECIFIC PROBLEM"   Note:   CARE PLAN ENTRY (see longitudinal plan of care for additional care plan information)  Current Barriers:  Knowledge deficits related to accessing mental health provider in patient with Anxiety and Depression  Patient is experiencing symptoms of  depression and anxiety  which seem to be exacerbated by workplace trauma, relationship challenges and grief.     Patient needs Support, Education, and Care Coordination in order to meet unmet mental health needs  Mental Health Concerns   Clinical Social Work Goal(s):  Over the next 90 days, patient will work with SW bi-weekly by telephone or in person to reduce or manage symptoms of anxiety and depression until connected for ongoing  counseling resources. (Patient currently in treatment, however only being seen every 6 weeks(approximately) Patient would like to see of there are available services in the community that can follow up with Patient will continue to implement clinical interventions discussed with regular therapist to decreases symptoms of anxiety and depression and increase knowledge and/or ability of: coping skills.  Interventions:  Continued to assess patient's understanding, education, previous treatment and care coordination needs  Provided basic mental health support, education and interventions related to her anxiety and depression Patient confirmed that relationship concerns are beginning to improve, however continues to need ongoing mental health support Discussed current plan to remain active with [place] and plans on continuing, however provided patient with additional options for outpatient therapy to be seen bi-weekly-[facility] [phone], [facility] [phone], [facility] [phone]. Follow up with therapist currently scheduled for  08/29/21 and Psychiatrist 07/30/21 Collaborated with appropriate clinical care team members regarding patient needs Patient agreeable to contacting one of these agencies for ongoing follow up if needed Other interventions include: Solution-Focused Strategies employed:  Active listening / Reflection utilized  Positive reinforcement provided for motivation to continue ongoing mental health support  Patient  Self Care Activities & Deficits:  Patient is unable to independently navigate community resource options without care coordination support Patient is able to implement clinical interventions discussed today and is motivated for treatment  Patient will select one of the agencies from the list provided and call to schedule an appointmen Performs ADL's independently Performs IADL's independently  Please see past updates  related to this goal by clicking on the "Past Updates" button in the selected goal        Follow Up Plan: Client will follow up with this social worker  with any additional community resource needs in the future       [place], [place] Worker  [place] [facility] [phone]

## 2021-07-17 ENCOUNTER — Ambulatory Visit: Payer: Medicare Other | Admitting: Speech Pathology

## 2021-07-17 NOTE — Patient Instructions (Signed)
Continue engaging in cognitively stimulating tasks

## 2021-07-17 NOTE — Therapy (Signed)
[place] MAIN [facility] SERVICES [address] [place], [place], [address] Phone: [phone]   Fax:  [phone]  Speech Language Pathology Treatment  Patient Details  Name: Melissa Arroyo MRN: [id number] Date of Birth: [birth year] Referring Provider (SLP): Hemang Candyce Churn   Encounter Date: 07/15/2021   End of Session - 07/17/21 1810     Visit Number 8    Number of Visits 25    Date for SLP Re-Evaluation 09/10/21    Authorization Type Medicare A/B    Authorization Time Period 06/12/2021 thru 09/10/2021    Authorization - Visit Number 6    Authorization - Number of Visits 24    Progress Note Due on Visit 10    SLP Start Time 1400    SLP Stop Time  1500    SLP Time Calculation (min) 60 min    Activity Tolerance Patient tolerated treatment well             Past Medical History:  Diagnosis Date   Anxiety    Depression    Diabetes mellitus ([place])    Dyspnea    Heart attack ([place]) 04/11/2007   Heart attack ([place])    Hyperlipidemia    Hypertension    Obesity    Recurrent UTI    Renal lesion    Urinary incontinence in female    Vaginal atrophy     Past Surgical History:  Procedure Laterality Date   BREAST BIOPSY Right 03/29/2021   stereo bx-calcs, "RIBBON" clip-path pending   BUNIONECTOMY  2001   CARDIAC CATHETERIZATION     COLONOSCOPY WITH PROPOFOL N/A 11/27/2020   Procedure: COLONOSCOPY WITH PROPOFOL;  Surgeon: Virgel Manifold, MD;  Location: [facility] ENDOSCOPY;  Service: Endoscopy;  Laterality: N/A;   DENTAL SURGERY     NECK SURGERY     ROTATOR CUFF REPAIR Left     There were no vitals filed for this visit.   Subjective Assessment - 07/17/21 1808     Subjective pt pleasant, eager, motivated    Currently in Pain? No/denies                   ADULT SLP TREATMENT - 07/17/21 0001       Cognitive-Linquistic Treatment   Treatment focused on Cognition;Patient/family/caregiver education    Skilled Treatment  Skilled treatment session focused on pt's cognitive impairments with focus on recall, visuospatial organization and strategy generation for problem solving.       SLP facilitated session by providing the following interventions:       Instruction in basic problem solving deductive reasoning task, specifically task organization and generation of strategies. After instruction, pt able to carryover use of strategies with rare min assistance for successful completion of task.              SLP Education - 07/17/21 1809     Education Details mal-adaptive behaviors    Person(s) Educated Patient    Methods Explanation;Demonstration;Verbal cues    Comprehension Verbalized understanding              SLP Short Term Goals - 06/18/21 2137       SLP SHORT TERM GOAL #1   Title With minimal assistance, pt will plan, schedule, and execute activity/assignment/appointment with 80% accuracy.    Baseline new goal    Time 10    Period --   sessions   Status New      SLP SHORT TERM GOAL #2  Title Pt will recall information after given delay of increasing length (5 to 60 seconds) with 80% accuracy given min cues.    Baseline new goal    Time 10    Period --   sessions   Status New      SLP SHORT TERM GOAL #3   Title With minimal assistance, pt will use external memory aids and compensatory strategies to aid short term recall with 80% accuracy.    Baseline new goal    Time 10    Period --   sessions   Status New              SLP Long Term Goals - 06/18/21 2141       SLP LONG TERM GOAL #1   Title Pt will a) recall and b) demonstrate use of at least three external memory strategies.    Baseline new goal    Time 12    Period Weeks    Status New    Target Date 09/10/21      SLP LONG TERM GOAL #2   Title Patient will demonstrate improved cognitive linguistic function for IND completion of ADLs tasks in home/community environments.    Baseline new goal    Time 12    Period  Weeks    Status New    Target Date 09/10/21              Plan - 07/17/21 1811     Clinical Impression Statement While pt appears eager to engage in tasks, however when faced with a moderately challenging task, pt displays avoidance behaviors. Pt continued to ask for a "easier activity." Unfortunately pt's cognitive abilities are worsened by her avoidance of tasks that she is able to complete. She exhibits negative thought processes that are a barrier to progress. Pt states that she will be seeing a counselor this week. Will continue brief course of skilled ST intervention to further assess for potential ability to make progress towards goals of increased functional independence.    Speech Therapy Frequency 2x / week    Duration 12 weeks    Treatment/Interventions Cueing hierarchy;Functional tasks;Patient/family education;Compensatory strategies;Internal/external aids;SLP instruction and feedback    Potential to Achieve Goals Fair    Potential Considerations Previous level of function;Severity of impairments;Cooperation/participation level    SLP Home Exercise Plan provided, see pt instructions section    Consulted and Agree with Plan of Care Patient             Patient will benefit from skilled therapeutic intervention in order to improve the following deficits and impairments:   Cognitive communication deficit  Cognitive impairment  Memory deficits    Problem List Patient Active Problem List   Diagnosis Date Noted   Diarrhea due to malabsorption 07/02/2021   Type 2 diabetes mellitus with microalbuminuria, without long-term current use of insulin ([place]) 06/06/2021   Depression, recurrent ([place]) 06/06/2021   Cognitive impairment 04/24/2021   Memory deficits 03/27/2021   Panic attack due to post traumatic stress disorder (PTSD) 03/27/2021   Thickened nails 02/19/2021   Nail fungus 02/19/2021   Medicare annual wellness visit, subsequent 02/19/2021   Abnormal mammogram  02/19/2021   Acquired hypothyroidism 02/19/2021   Need for shingles vaccine 02/19/2021   Cough due to bronchospasm 02/19/2021   Hypertension associated with diabetes ([place]) 02/19/2021   Chronic bilateral low back pain without sciatica 11/09/2020   Hyperlipidemia associated with type 2 diabetes mellitus ([place]) 07/24/2014   Carmen Vallecillo B. Rutherford Nail, M.S.,  CCC-SLP, Administrator, [employer] [place] Office [phone] [place] [phone] Fax [phone]  Stormy Fabian, Layne Benton 07/17/2021, 6:18 PM  [place] MAIN [facility] SERVICES [address] [place], [place], [address] Phone: [phone]   Fax:  [phone]   Name: Melissa Arroyo MRN: [id number] Date of Birth: Nov 26, 1950

## 2021-07-22 ENCOUNTER — Telehealth: Payer: Self-pay | Admitting: Family Medicine

## 2021-07-22 ENCOUNTER — Ambulatory Visit: Payer: Medicare Other | Admitting: Speech Pathology

## 2021-07-22 NOTE — Telephone Encounter (Signed)
[facility] faxed refill request for the following medications:   lovastatin (MEVACOR) 40 MG tablet     Please advise.  

## 2021-07-23 ENCOUNTER — Telehealth: Payer: Self-pay | Admitting: Family Medicine

## 2021-07-23 NOTE — Telephone Encounter (Signed)
Provider discontinued this medication.

## 2021-07-23 NOTE — Telephone Encounter (Signed)
[facility] faxed refill request for the following medications:   lovastatin (MEVACOR) 40 MG tablet     Please advise.  

## 2021-07-24 ENCOUNTER — Encounter: Payer: Medicare Other | Admitting: Speech Pathology

## 2021-07-24 DIAGNOSIS — F4011 Social phobia, generalized: Secondary | ICD-10-CM | POA: Diagnosis not present

## 2021-07-24 DIAGNOSIS — F3341 Major depressive disorder, recurrent, in partial remission: Secondary | ICD-10-CM | POA: Diagnosis not present

## 2021-07-24 DIAGNOSIS — F410 Panic disorder [episodic paroxysmal anxiety] without agoraphobia: Secondary | ICD-10-CM | POA: Diagnosis not present

## 2021-07-24 DIAGNOSIS — F3176 Bipolar disorder, in full remission, most recent episode depressed: Secondary | ICD-10-CM | POA: Diagnosis not present

## 2021-07-24 DIAGNOSIS — F900 Attention-deficit hyperactivity disorder, predominantly inattentive type: Secondary | ICD-10-CM | POA: Diagnosis not present

## 2021-07-26 ENCOUNTER — Encounter: Payer: Medicare Other | Admitting: Speech Pathology

## 2021-07-29 ENCOUNTER — Encounter: Payer: Medicare Other | Admitting: Speech Pathology

## 2021-07-30 ENCOUNTER — Ambulatory Visit: Payer: Medicare Other | Admitting: Psychiatry

## 2021-07-31 ENCOUNTER — Encounter: Payer: Medicare Other | Admitting: Speech Pathology

## 2021-08-05 ENCOUNTER — Telehealth: Payer: Self-pay | Admitting: Family Medicine

## 2021-08-05 ENCOUNTER — Other Ambulatory Visit: Payer: Self-pay

## 2021-08-05 ENCOUNTER — Encounter: Payer: Medicare Other | Admitting: Speech Pathology

## 2021-08-05 DIAGNOSIS — E1129 Type 2 diabetes mellitus with other diabetic kidney complication: Secondary | ICD-10-CM

## 2021-08-05 MED ORDER — PIOGLITAZONE HCL 30 MG PO TABS: ORAL_TABLET | ORAL | 0 refills | Status: DC

## 2021-08-07 ENCOUNTER — Encounter: Payer: Medicare Other | Admitting: Speech Pathology

## 2021-08-09 DIAGNOSIS — I152 Hypertension secondary to endocrine disorders: Secondary | ICD-10-CM

## 2021-08-09 DIAGNOSIS — R809 Proteinuria, unspecified: Secondary | ICD-10-CM

## 2021-08-09 DIAGNOSIS — F339 Major depressive disorder, recurrent, unspecified: Secondary | ICD-10-CM

## 2021-08-09 DIAGNOSIS — E1159 Type 2 diabetes mellitus with other circulatory complications: Secondary | ICD-10-CM

## 2021-08-09 DIAGNOSIS — E1129 Type 2 diabetes mellitus with other diabetic kidney complication: Secondary | ICD-10-CM

## 2021-08-12 ENCOUNTER — Encounter: Payer: Medicare Other | Admitting: Speech Pathology

## 2021-08-14 ENCOUNTER — Encounter: Payer: Medicare Other | Admitting: Speech Pathology

## 2021-08-16 ENCOUNTER — Other Ambulatory Visit: Payer: Self-pay | Admitting: Family Medicine

## 2021-08-16 ENCOUNTER — Telehealth: Payer: Self-pay

## 2021-08-16 DIAGNOSIS — Z1231 Encounter for screening mammogram for malignant neoplasm of breast: Secondary | ICD-10-CM

## 2021-08-16 DIAGNOSIS — R922 Inconclusive mammogram: Secondary | ICD-10-CM

## 2021-08-16 NOTE — Telephone Encounter (Signed)
Copied from [place] [phone]. Topic: Referral - Status >> Aug 16, 2021  1:22 PM Ja-Kwan M wrote: Reason for CRM: Pt request referral to [facility]iology for a follow up of mammogram and ultrasound.

## 2021-08-29 ENCOUNTER — Ambulatory Visit (INDEPENDENT_AMBULATORY_CARE_PROVIDER_SITE_OTHER): Payer: Medicare Other | Admitting: Licensed Clinical Social Worker

## 2021-08-29 DIAGNOSIS — F4323 Adjustment disorder with mixed anxiety and depressed mood: Secondary | ICD-10-CM

## 2021-08-29 NOTE — Progress Notes (Signed)
  THERAPIST PROGRESS NOTE  Session Time: 310-4p  [facility] in office visit for patient and LCSW clinician  Participation Level: Active  Behavioral Response: NeatAlertAnxious and Depressed  Type of Therapy: Individual Therapy  Treatment Goals addressed: Problem: Depression CCP Problem  1 Decrease depressive symptoms and improve levels of effective functioning  Goal: LTG: Reduce frequency, intensity, and duration of depression symptoms as evidenced by: SSB input needed on appropriate metric--pt self report Outcome: Progressing Note: Pt reports fewer depression symptoms than at last session Intervention: REVIEW PLEASE SKILLS (TREAT PHYSICAL ILLNESS, BALANCE EATING, AVOID MOOD-ALTERING SUBSTANCES, BALANCE SLEEP AND GET EXERCISE) WITH Manuela Schwartz Intervention: Encourage verbalization of feelings/concerns/expectations   Problem: Anxiety Disorder CCP Problem  1 Reduce overall frequency, intensity, and duration of the anxiety so that daily functioning is not impaired.  Goal: LTG: Patient will score less than 5 on the Generalized Anxiety Disorder 7 Scale (GAD-7) Note: Pt reports fewer anxiety symptoms than at last session Intervention: Encourage patient to identify triggers Intervention: Assist with relaxation techniques, as appropriate (deep breathing exercises, meditation, guided imagery)   ProgressTowards Goals: Progressing  Interventions: CBT (see above)  Summary: Melissa Arroyo is a 71 y.o. female who presents with improving symptoms related to depression. Pt reports that overall mood has been stable recently.     Allowed pt to explore and express thoughts and feelings associated with recent life situations and external stressors. Discussed stress associated with relationship w/ husband.  Explored intimacy and overall concerns pt has about the relationship. Discussed negative cognitions pt has about self and encouraged pt to reframe. Provided pt with psychoeducational materials.  Discussed 5 love  languages.  Discussed pts physical/medical concerns and current supports pt is engaged with.   Continued recommendations are as follows: self care behaviors, positive social engagements, focusing on overall work/home/life balance, and focusing on positive physical and emotional wellness.  .   Suicidal/Homicidal: No  Therapist Response: Pt is continuing to apply interventions learned in session into daily life situations. Pt is currently on track to meet goals utilizing interventions mentioned above. Personal growth and progress noted. Treatment to continue as indicated.   Plan: Return again in 4 weeks.  Diagnosis: No diagnosis found.  Collaboration of Care: Other Pt encouraged to continue psychiatric care with Dr. Shea Evans and continue with neuropsychiatry follow ups  Patient/Guardian was advised Release of Information must be obtained prior to any record release in order to collaborate their care with an outside provider. Patient/Guardian was advised if they have not already done so to contact the registration department to sign all necessary forms in order for [place] to release information regarding their care.   Consent: Patient/Guardian gives verbal consent for treatment and assignment of benefits for services provided during this visit. Patient/Guardian expressed understanding and agreed to proceed.   [place], LCSW 08/29/2021

## 2021-08-30 NOTE — Plan of Care (Signed)
  Problem: Depression CCP Problem  1 Decrease depressive symptoms and improve levels of effective functioning  Goal: LTG: Reduce frequency, intensity, and duration of depression symptoms as evidenced by: SSB input needed on appropriate metric--pt self report Outcome: Progressing Goal: STG: Melissa Arroyo WILL PARTICIPATE IN AT LEAST 80% OF SCHEDULED INDIVIDUAL PSYCHOTHERAPY SESSIONS Outcome: Progressing   Problem: Anxiety Disorder CCP Problem  1 Reduce overall frequency, intensity, and duration of the anxiety so that daily functioning is not impaired.  Goal: LTG: Patient will score less than 5 on the Generalized Anxiety Disorder 7 Scale (GAD-7) Outcome: Progressing

## 2021-09-03 NOTE — Progress Notes (Unsigned)
I,Roshena L Chambers,acting as a scribe for [name], FNP.,have documented all relevant documentation on the behalf of [name], FNP,as directed by  [name], FNP while in the presence of [name], FNP.   Established patient visit   Patient: Melissa Arroyo   DOB: 1950-02-24   71 y.o. Female  MRN: [id number] Visit Date: 09/04/2021  Today's healthcare provider: [name], FNP  Re Introduced to nurse practitioner role and practice setting.  All questions answered.  Discussed provider/patient relationship and expectations.   No chief complaint on file.  Subjective    HPI  Diabetes Mellitus Type II, Follow-up  Lab Results  Component Value Date   HGBA1C 7.1 (H) 02/19/2021   HGBA1C 6.7 (H) 11/19/2020   HGBA1C 7.1 (H) 05/15/2020   Wt Readings from Last 3 Encounters:  09/04/21 208 lb (94.3 kg)  07/02/21 206 lb 3.2 oz (93.5 kg)  06/06/21 207 lb (93.9 kg)   Last seen for diabetes 2 months ago.  Management since then includes: continue to recommend balanced, lower carb meals. Smaller meal size, adding snacks. Choosing water as drink of choice and increasing purposeful exercise. Continue same medication. She reports good compliance with treatment. She is not having side effects.  Symptoms: No fatigue No foot ulcerations  No appetite changes No nausea  No paresthesia of the feet  No polydipsia  No polyuria No visual disturbances   No vomiting     Home blood sugar records: fasting range: 130's  Episodes of hypoglycemia? No    Current insulin regiment: none Most Recent Eye Exam: <1 year ago Current exercise: walking Current diet habits: well balanced  Pertinent Labs: Lab Results  Component Value Date   CHOL 169 02/19/2021   HDL 51 02/19/2021   LDLCALC 95 02/19/2021   TRIG 130 02/19/2021   CHOLHDL 3.3 02/19/2021   Lab Results  Component Value Date   NA 138 02/19/2021   K 4.9 02/19/2021   CREATININE 0.89 02/19/2021   EGFR 70 02/19/2021    MICROALBUR 20 04/18/2019   LABMICR <3.0 02/19/2021     ---------------------------------------------------------------------------------------------------   Hypertension, follow-up  BP Readings from Last 3 Encounters:  09/04/21 (!) 113/52  07/02/21 140/62  06/06/21 (!) 132/56   Wt Readings from Last 3 Encounters:  09/04/21 208 lb (94.3 kg)  07/02/21 206 lb 3.2 oz (93.5 kg)  06/06/21 207 lb (93.9 kg)     She was last seen for hypertension 3 months ago.  BP at that visit was 132/56. Management since that visit includes continuing same medication.  She reports good compliance with treatment. She is not having side effects.  She is following a Regular diet. She is exercising. She does not smoke.  Use of agents associated with hypertension: none.   Outside blood pressures are occasionally checked. Symptoms: No chest pain No chest pressure  No palpitations No syncope  No dyspnea No orthopnea  No paroxysmal nocturnal dyspnea No lower extremity edema   Pertinent labs Lab Results  Component Value Date   CHOL 169 02/19/2021   HDL 51 02/19/2021   LDLCALC 95 02/19/2021   TRIG 130 02/19/2021   CHOLHDL 3.3 02/19/2021   Lab Results  Component Value Date   NA 138 02/19/2021   K 4.9 02/19/2021   CREATININE 0.89 02/19/2021   EGFR 70 02/19/2021   GLUCOSE 130 (H) 02/19/2021   TSH 4.270 02/19/2021     The 10-year ASCVD risk score (Arnett DK,  et al., 2019) is: 16%  ---------------------------------------------------------------------------------------------------   Medications: Outpatient Medications Prior to Visit  Medication Sig   ACCU-CHEK GUIDE test strip USE TO TEST ONCE DAILY   Alcohol Swabs (ALCOHOL PREP) PADS Use 1 pad prior to checking blood sugar to clean skin   b complex vitamins capsule Take 1 capsule by mouth daily.   blood glucose meter kit and supplies Dispense based on patient and insurance preference. Check glucose once daily. (FOR ICD-10 E11.29).    buPROPion (WELLBUTRIN XL) 300 MG 24 hr tablet Take 300 mg by mouth every morning.   chlorhexidine (PERIDEX) 0.12 % solution SMARTSIG:By Mouth   Cholecalciferol (VITAMIN D3) 10 MCG (400 UNIT) tablet Take by mouth daily. Dose unknown   cyclobenzaprine (FLEXERIL) 5 MG tablet Take 1 tablet (5 mg total) by mouth 3 (three) times daily as needed for muscle spasms (back pain, start with PM dosing).   dicyclomine (BENTYL) 10 MG capsule Take 1 capsule (10 mg total) by mouth 4 (four) times daily -  before meals and at bedtime.   donepezil (ARICEPT) 5 MG tablet Take 1 tablet by mouth daily.   estradiol (ESTRACE) 0.1 MG/GM vaginal cream Place 1 Applicatorful vaginally at bedtime.   FLUoxetine (PROZAC) 20 MG capsule Take 3 capsules (60 mg total) by mouth daily.   FLUoxetine (PROZAC) 40 MG capsule Take 40 mg by mouth daily.   fluticasone (FLONASE SENSIMIST) 27.5 MCG/SPRAY nasal spray Place 1-2 sprays into the nose daily.   glipiZIDE (GLUCOTROL XL) 10 MG 24 hr tablet TAKE 1 TABLET(10 MG) BY MOUTH DAILY WITH BREAKFAST   Glucosamine-Chondroitin (MOVE FREE PO) Take 1 tablet by mouth daily.   ibuprofen (ADVIL,MOTRIN) 200 MG tablet Take 400 mg by mouth at bedtime. As needed   meloxicam (MOBIC) 7.5 MG tablet Take 2 tablets (15 mg total) by mouth daily. Avoid other NSAIDs, including Ibuprofen and Naproxen. Take with food/milk.   Multiple Vitamins-Minerals (CENTRUM SILVER PO) Take 1 tablet by mouth daily.   OLANZapine (ZYPREXA) 5 MG tablet Take 7.5 mg by mouth at bedtime.   propranolol (INDERAL) 10 MG tablet TAKE 1 TABLET(10 MG) BY MOUTH TWICE DAILY   zinc gluconate 50 MG tablet Take by mouth daily. Dose unknown   [DISCONTINUED] atorvastatin (LIPITOR) 40 MG tablet TAKE 1 TABLET(40 MG) BY MOUTH DAILY   [DISCONTINUED] lisinopril (ZESTRIL) 5 MG tablet Take 1-2 tablets PO daily  based on home BP readings; Goal BP is <130/<80; Please let your provider know if your blood pressure is consistently above this goal.    [DISCONTINUED] metFORMIN (GLUCOPHAGE-XR) 500 MG 24 hr tablet Take 2 tablets (1,000 mg total) by mouth 2 (two) times daily after a meal.   [DISCONTINUED] pioglitazone (ACTOS) 30 MG tablet TAKE 1 TABLET(30 MG) BY MOUTH DAILY   No facility-administered medications prior to visit.    Review of Systems    Objective    BP (!) 113/52 (BP Location: Right Arm, Patient Position: Sitting, Cuff Size: Large)   Pulse (!) 55   Temp 97.8 F (36.6 C) (Oral)   Resp 14   Wt 208 lb (94.3 kg)   SpO2 98% Comment: room air  BMI 34.61 kg/m    Physical Exam Vitals and nursing note reviewed.  Constitutional:      General: She is not in acute distress.    Appearance: Normal appearance. She is obese. She is not ill-appearing, toxic-appearing or diaphoretic.  HENT:     Head: Normocephalic and atraumatic.  Cardiovascular:     Rate and  Rhythm: Normal rate and regular rhythm.     Pulses: Normal pulses.     Heart sounds: Normal heart sounds. No murmur heard.    No friction rub. No gallop.  Pulmonary:     Effort: Pulmonary effort is normal. No respiratory distress.     Breath sounds: Normal breath sounds. No stridor. No wheezing, rhonchi or rales.  Chest:     Chest wall: No tenderness.  Abdominal:     General: Bowel sounds are normal.     Palpations: Abdomen is soft.  Musculoskeletal:        General: No swelling, tenderness, deformity or signs of injury. Normal range of motion.     Right lower leg: No edema.     Left lower leg: No edema.  Skin:    General: Skin is warm and dry.     Capillary Refill: Capillary refill takes less than 2 seconds.     Coloration: Skin is not jaundiced or pale.     Findings: No bruising, erythema, lesion or rash.  Neurological:     General: No focal deficit present.     Mental Status: She is alert and oriented to person, place, and time. Mental status is at baseline.     Cranial Nerves: No cranial nerve deficit.     Sensory: No sensory deficit.     Motor: No weakness.      Coordination: Coordination normal.  Psychiatric:        Mood and Affect: Mood normal.        Behavior: Behavior normal.        Thought Content: Thought content normal.        Judgment: Judgment normal.     No results found for any visits on 09/04/21.  Assessment & Plan     Problem List Items Addressed This Visit       Cardiovascular and Mediastinum   Hypertension associated with diabetes ([place])    Chronic, stable At goal of 113/52 Continue 5 mg of lisinopril for kidney protection and HTN control       Relevant Medications   atorvastatin (LIPITOR) 40 MG tablet   lisinopril (ZESTRIL) 5 MG tablet   metFORMIN (GLUCOPHAGE-XR) 500 MG 24 hr tablet   pioglitazone (ACTOS) 30 MG tablet     Endocrine   Type 2 diabetes mellitus with microalbuminuria, without long-term current use of insulin ([facility])    Chronic, previously stable at 7.1% Continue to recommend balanced, lower carb meals. Smaller meal size, adding snacks. Choosing water as drink of choice and increasing purposeful exercise. Repeat A1c today 3 month fu encouraged      Relevant Medications   atorvastatin (LIPITOR) 40 MG tablet   lisinopril (ZESTRIL) 5 MG tablet   metFORMIN (GLUCOPHAGE-XR) 500 MG 24 hr tablet   pioglitazone (ACTOS) 30 MG tablet   Other Relevant Orders   Hemoglobin A1c     Return in about 3 months (around 12/05/2021) for chonic disease management.      Vonna Kotyk, FNP, have reviewed all documentation for this visit. The documentation on 09/04/21 for the exam, diagnosis, procedures, and orders are all accurate and complete.    [name], [place] [phone] (phone) [phone] (fax)  [place]

## 2021-09-04 ENCOUNTER — Telehealth: Payer: Self-pay | Admitting: Family Medicine

## 2021-09-04 ENCOUNTER — Ambulatory Visit: Payer: Medicare Other | Admitting: Family Medicine

## 2021-09-04 ENCOUNTER — Encounter: Payer: Self-pay | Admitting: Family Medicine

## 2021-09-04 ENCOUNTER — Ambulatory Visit (INDEPENDENT_AMBULATORY_CARE_PROVIDER_SITE_OTHER): Payer: Medicare Other | Admitting: Family Medicine

## 2021-09-04 ENCOUNTER — Other Ambulatory Visit: Payer: Self-pay | Admitting: Family Medicine

## 2021-09-04 DIAGNOSIS — I152 Hypertension secondary to endocrine disorders: Secondary | ICD-10-CM

## 2021-09-04 DIAGNOSIS — E1129 Type 2 diabetes mellitus with other diabetic kidney complication: Secondary | ICD-10-CM | POA: Diagnosis not present

## 2021-09-04 DIAGNOSIS — R809 Proteinuria, unspecified: Secondary | ICD-10-CM | POA: Diagnosis not present

## 2021-09-04 DIAGNOSIS — E1159 Type 2 diabetes mellitus with other circulatory complications: Secondary | ICD-10-CM

## 2021-09-04 MED ORDER — LISINOPRIL 5 MG PO TABS: 5.0000 mg | ORAL_TABLET | Freq: Every day | ORAL | 3 refills | Status: DC

## 2021-09-04 MED ORDER — METFORMIN HCL ER 500 MG PO TB24: 500.0000 mg | ORAL_TABLET | Freq: Two times a day (BID) | ORAL | 3 refills | Status: DC

## 2021-09-04 MED ORDER — PIOGLITAZONE HCL 30 MG PO TABS: ORAL_TABLET | ORAL | 3 refills | Status: DC

## 2021-09-04 MED ORDER — ATORVASTATIN CALCIUM 40 MG PO TABS: ORAL_TABLET | ORAL | 3 refills | Status: DC

## 2021-09-04 NOTE — Assessment & Plan Note (Signed)
Chronic, previously stable at 7.1% Continue to recommend balanced, lower carb meals. Smaller meal size, adding snacks. Choosing water as drink of choice and increasing purposeful exercise. Repeat A1c today 3 month fu encouraged

## 2021-09-04 NOTE — Telephone Encounter (Signed)
Pharmacy requesting clarification on directions for Lisinopril. Please advise. They will need a new prescription sent in with the correct instructions. Please review and advise.

## 2021-09-04 NOTE — Telephone Encounter (Signed)
lisinopril (ZESTRIL) 5 MG tablet 90 tablet 3 09/04/2021    Sig - Route: Take 1 tablet (5 mg total) by mouth daily. Take 1-2 tablets PO daily  based on home BP readings; Goal BP is <130/<80; Please let your provider know if your blood pressure is consistently above this goal. - Oral   Per Pharmacy, please note issue with script " 1 tablet daily." Then also reads "1-2 daily based on BP readings." Send new script [id number] [address], [address]  [place] [address]  Phone: [phone] Fax: [phone]  Hours: Not open 24 hours

## 2021-09-04 NOTE — Assessment & Plan Note (Signed)
Chronic, stable At goal of 113/52 Continue 5 mg of lisinopril for kidney protection and HTN control

## 2021-09-05 LAB — HEMOGLOBIN A1C
Est. average glucose Bld gHb Est-mCnc: 143 mg/dL
Hgb A1c MFr Bld: 6.6 % — ABNORMAL HIGH (ref 4.8–5.6)

## 2021-09-05 NOTE — Progress Notes (Signed)
Hi Melissa Arroyo,  It was a pleasure to see you in the office the other day.  A1c continues to improve; now at 6.6%.   Continue to recommend balanced, lower carb meals. Smaller meal size, adding snacks. Choosing water as drink of choice and increasing purposeful exercise.  Please let us know if you have any questions.  Thank you, [name], [address] [place], [place] [address] [phone] (phone) [phone] (fax) [place]

## 2021-09-20 ENCOUNTER — Ambulatory Visit: Payer: Self-pay

## 2021-09-27 ENCOUNTER — Ambulatory Visit
Admission: RE | Admit: 2021-09-27 | Discharge: 2021-09-27 | Disposition: A | Discharge status: Home / Self Care | Payer: Medicare Other | Source: Ambulatory Visit | Attending: Family Medicine | Admitting: Family Medicine

## 2021-09-27 DIAGNOSIS — R922 Inconclusive mammogram: Secondary | ICD-10-CM | POA: Diagnosis not present

## 2021-09-27 DIAGNOSIS — Z1231 Encounter for screening mammogram for malignant neoplasm of breast: Secondary | ICD-10-CM | POA: Insufficient documentation

## 2021-09-27 DIAGNOSIS — R921 Mammographic calcification found on diagnostic imaging of breast: Secondary | ICD-10-CM | POA: Diagnosis not present

## 2021-09-30 NOTE — Progress Notes (Signed)
Hi Melissa Arroyo  Normal mammogram; repeat in 1 year.  Please let us know if you have any questions.  Thank you,  Tally Joe, FNP

## 2021-10-09 DIAGNOSIS — F332 Major depressive disorder, recurrent severe without psychotic features: Secondary | ICD-10-CM | POA: Diagnosis not present

## 2021-10-09 DIAGNOSIS — F4312 Post-traumatic stress disorder, chronic: Secondary | ICD-10-CM | POA: Diagnosis not present

## 2021-10-10 ENCOUNTER — Ambulatory Visit (INDEPENDENT_AMBULATORY_CARE_PROVIDER_SITE_OTHER): Payer: Medicare Other | Admitting: Licensed Clinical Social Worker

## 2021-10-10 DIAGNOSIS — F4323 Adjustment disorder with mixed anxiety and depressed mood: Secondary | ICD-10-CM | POA: Diagnosis not present

## 2021-10-11 NOTE — Progress Notes (Unsigned)
THERAPIST PROGRESS NOTE  Session Time: [id number]  ARPA in office visit for patient and LCSW clinician  Participation Level: Active  Behavioral Response: NeatAlertAnxious and Depressed  Type of Therapy: Individual Therapy  Treatment Goals addressed: Problem: Depression CCP Problem  1 Decrease depressive symptoms and improve levels of effective functioning  Goal: LTG: Reduce frequency, intensity, and duration of depression symptoms as evidenced by: SSB input needed on appropriate metric--pt self report Outcome: Progressing Note: Pt reports fewer depression symptoms than at last session Intervention: REVIEW PLEASE SKILLS (TREAT PHYSICAL ILLNESS, BALANCE EATING, AVOID MOOD-ALTERING SUBSTANCES, BALANCE SLEEP AND GET EXERCISE) WITH Manuela Schwartz Intervention: Encourage verbalization of feelings/concerns/expectations   Problem: Anxiety Disorder CCP Problem  1 Reduce overall frequency, intensity, and duration of the anxiety so that daily functioning is not impaired.  Goal: LTG: Patient will score less than 5 on the Generalized Anxiety Disorder 7 Scale (GAD-7) Note: Pt reports fewer anxiety symptoms than at last session Intervention: Encourage patient to identify triggers Intervention: Assist with relaxation techniques, as appropriate (deep breathing exercises, meditation, guided imagery)   ProgressTowards Goals: Progressing  Interventions: CBT (see above)  Summary: Melissa Arroyo is a 71 y.o. female who presents with improving symptoms related to depression. Pt reports that overall mood has been stable recently.     Allowed pt to explore and express thoughts and feelings associated with recent life situations and external stressors. Patient reports that she is continuing with the "day trading" activities. Patient reports that her husband has also made the decision that he is going to rent out each bedroom in his home to individuals. Patient reports that he also wants to buy an RV, park the RV and  the driveway of his house, and also rent that out. Allowed patient to explore her thoughts and feelings associated with this decision and the overall impact that it could have on their relationship. Allow patient to explore previous jobs, and behavior patterns both in herself and others. Discussed journaling and reflections of reading previous journal entries. Patient reports that she feels that she has ADHD, and wants to get assessment for that. Discussed importance of time management, organizational skills in managing ADHD or any deficit in executive functioning skills. Allow patient to identify how she feels that she is doing with time management and organizational skills. Discussed daily structure, and ways that patient can improve in all areas.  Patient reflects understanding and willingness to cooperate at time of session.  Continued recommendations are as follows: self care behaviors, positive social engagements, focusing on overall work/home/life balance, and focusing on positive physical and emotional wellness.  .   Suicidal/Homicidal: No  Therapist Response: Pt is continuing to apply interventions learned in session into daily life situations. Pt is currently on track to meet goals utilizing interventions mentioned above. Personal growth and progress noted. Treatment to continue as indicated.   Plan: Return again in 4 weeks.  Diagnosis: Adjustment disorder with mixed anxiety and depressed mood  Collaboration of Care: Other Pt encouraged to continue psychiatric care with Dr. Shea Evans and continue with neuropsychiatry follow ups  Patient/Guardian was advised Release of Information must be obtained prior to any record release in order to collaborate their care with an outside provider. Patient/Guardian was advised if they have not already done so to contact the registration department to sign all necessary forms in order for [place] to release information regarding their care.   Consent:  Patient/Guardian gives verbal consent for treatment and assignment of benefits for services provided during this  visit. Patient/Guardian expressed understanding and agreed to proceed.   [place], LCSW 10/11/2021

## 2021-10-12 NOTE — Plan of Care (Signed)
  Problem: Depression CCP Problem  1 Decrease depressive symptoms and improve levels of effective functioning  Goal: LTG: Reduce frequency, intensity, and duration of depression symptoms as evidenced by: SSB input needed on appropriate metric--pt self report Outcome: Progressing Goal: STG: Jeyli WILL PARTICIPATE IN AT LEAST 80% OF SCHEDULED INDIVIDUAL PSYCHOTHERAPY SESSIONS Outcome: Progressing   Problem: Anxiety Disorder CCP Problem  1 Reduce overall frequency, intensity, and duration of the anxiety so that daily functioning is not impaired.  Goal: LTG: Patient will score less than 5 on the Generalized Anxiety Disorder 7 Scale (GAD-7) Outcome: Progressing

## 2021-10-23 DIAGNOSIS — F4312 Post-traumatic stress disorder, chronic: Secondary | ICD-10-CM | POA: Diagnosis not present

## 2021-10-23 DIAGNOSIS — F332 Major depressive disorder, recurrent severe without psychotic features: Secondary | ICD-10-CM | POA: Diagnosis not present

## 2021-10-24 DIAGNOSIS — F900 Attention-deficit hyperactivity disorder, predominantly inattentive type: Secondary | ICD-10-CM | POA: Diagnosis not present

## 2021-10-24 DIAGNOSIS — F3341 Major depressive disorder, recurrent, in partial remission: Secondary | ICD-10-CM | POA: Diagnosis not present

## 2021-10-24 DIAGNOSIS — F4011 Social phobia, generalized: Secondary | ICD-10-CM | POA: Diagnosis not present

## 2021-10-24 DIAGNOSIS — F3176 Bipolar disorder, in full remission, most recent episode depressed: Secondary | ICD-10-CM | POA: Diagnosis not present

## 2021-10-24 DIAGNOSIS — F410 Panic disorder [episodic paroxysmal anxiety] without agoraphobia: Secondary | ICD-10-CM | POA: Diagnosis not present

## 2021-10-30 ENCOUNTER — Other Ambulatory Visit: Payer: Self-pay | Admitting: Family Medicine

## 2021-10-30 DIAGNOSIS — I152 Hypertension secondary to endocrine disorders: Secondary | ICD-10-CM

## 2021-11-04 DIAGNOSIS — F4312 Post-traumatic stress disorder, chronic: Secondary | ICD-10-CM | POA: Diagnosis not present

## 2021-11-04 DIAGNOSIS — F332 Major depressive disorder, recurrent severe without psychotic features: Secondary | ICD-10-CM | POA: Diagnosis not present

## 2021-11-05 DIAGNOSIS — G44209 Tension-type headache, unspecified, not intractable: Secondary | ICD-10-CM | POA: Diagnosis not present

## 2021-11-05 DIAGNOSIS — M545 Low back pain, unspecified: Secondary | ICD-10-CM | POA: Diagnosis not present

## 2021-11-05 DIAGNOSIS — F419 Anxiety disorder, unspecified: Secondary | ICD-10-CM | POA: Diagnosis not present

## 2021-11-05 DIAGNOSIS — G8929 Other chronic pain: Secondary | ICD-10-CM | POA: Diagnosis not present

## 2021-11-05 DIAGNOSIS — M5136 Other intervertebral disc degeneration, lumbar region: Secondary | ICD-10-CM | POA: Diagnosis not present

## 2021-11-05 DIAGNOSIS — R4189 Other symptoms and signs involving cognitive functions and awareness: Secondary | ICD-10-CM | POA: Diagnosis not present

## 2021-11-05 DIAGNOSIS — F319 Bipolar disorder, unspecified: Secondary | ICD-10-CM | POA: Diagnosis not present

## 2021-11-05 DIAGNOSIS — M4316 Spondylolisthesis, lumbar region: Secondary | ICD-10-CM | POA: Diagnosis not present

## 2021-11-10 ENCOUNTER — Other Ambulatory Visit: Payer: Self-pay | Admitting: Family Medicine

## 2021-11-10 DIAGNOSIS — E1159 Type 2 diabetes mellitus with other circulatory complications: Secondary | ICD-10-CM

## 2021-11-10 DIAGNOSIS — I152 Hypertension secondary to endocrine disorders: Secondary | ICD-10-CM

## 2021-11-15 ENCOUNTER — Other Ambulatory Visit: Payer: Self-pay | Admitting: Family Medicine

## 2021-11-15 DIAGNOSIS — E1129 Type 2 diabetes mellitus with other diabetic kidney complication: Secondary | ICD-10-CM

## 2021-11-19 DIAGNOSIS — F410 Panic disorder [episodic paroxysmal anxiety] without agoraphobia: Secondary | ICD-10-CM | POA: Diagnosis not present

## 2021-11-19 DIAGNOSIS — G4701 Insomnia due to medical condition: Secondary | ICD-10-CM | POA: Diagnosis not present

## 2021-11-19 DIAGNOSIS — F39 Unspecified mood [affective] disorder: Secondary | ICD-10-CM | POA: Diagnosis not present

## 2021-11-19 DIAGNOSIS — M533 Sacrococcygeal disorders, not elsewhere classified: Secondary | ICD-10-CM | POA: Diagnosis not present

## 2021-11-19 DIAGNOSIS — F4312 Post-traumatic stress disorder, chronic: Secondary | ICD-10-CM | POA: Diagnosis not present

## 2021-11-19 DIAGNOSIS — M5459 Other low back pain: Secondary | ICD-10-CM | POA: Diagnosis not present

## 2021-11-29 DIAGNOSIS — F332 Major depressive disorder, recurrent severe without psychotic features: Secondary | ICD-10-CM | POA: Diagnosis not present

## 2021-11-29 DIAGNOSIS — F4312 Post-traumatic stress disorder, chronic: Secondary | ICD-10-CM | POA: Diagnosis not present

## 2021-12-04 ENCOUNTER — Ambulatory Visit (INDEPENDENT_AMBULATORY_CARE_PROVIDER_SITE_OTHER): Payer: Medicare Other | Admitting: Licensed Clinical Social Worker

## 2021-12-04 DIAGNOSIS — F4323 Adjustment disorder with mixed anxiety and depressed mood: Secondary | ICD-10-CM

## 2021-12-05 DIAGNOSIS — F4312 Post-traumatic stress disorder, chronic: Secondary | ICD-10-CM | POA: Diagnosis not present

## 2021-12-05 DIAGNOSIS — F39 Unspecified mood [affective] disorder: Secondary | ICD-10-CM | POA: Diagnosis not present

## 2021-12-05 DIAGNOSIS — F410 Panic disorder [episodic paroxysmal anxiety] without agoraphobia: Secondary | ICD-10-CM | POA: Diagnosis not present

## 2021-12-05 NOTE — Progress Notes (Signed)
THERAPIST PROGRESS NOTE  Session Time: [place] in office visit for patient and LCSW clinician  Participation Level: Active  Behavioral Response: NeatAlertAnxious and Depressed  Type of Therapy: Individual Therapy  Treatment Goals addressed: Problem: Depression CCP Problem  1 Decrease depressive symptoms and improve levels of effective functioning  Goal: LTG: Reduce frequency, intensity, and duration of depression symptoms as evidenced by: SSB input needed on appropriate metric--pt self report Outcome: Progressing Goal: STG: Melissa Arroyo WILL PARTICIPATE IN AT LEAST 80% OF SCHEDULED INDIVIDUAL PSYCHOTHERAPY SESSIONS Outcome: Progressing Intervention: Encourage verbalization of feelings/concerns/expectations   Problem: Anxiety Disorder CCP Problem  1 Reduce overall frequency, intensity, and duration of the anxiety so that daily functioning is not impaired.  Goal: LTG: Patient will score less than 5 on the Generalized Anxiety Disorder 7 Scale (GAD-7) Outcome: Progressing Intervention: Assist with relaxation techniques, as appropriate (deep breathing exercises, meditation, guided imagery)  ProgressTowards Goals: Progressing  Interventions: CBT  Summary: Melissa Arroyo is a 71 y.o. female who presents with improving symptoms related to depression. Pt reports that overall mood has been stable recently. Pt reports she is currently under the psychiatric care of Dr. Nicolasa Ducking in [place]. Pt reports that she is going to start psychotherapy with Melissa Arroyo in [place] due to her ability to see pt every two weeks.   Pt reports that her new psychiatrist has switched up her medication--took her off wellbutrin and put her on cymbalta. Pt feels like she's in a "brain fog" and has to take naps in the afternoon.   Allowed pt to explore and express thoughts and feelings associated with recent life situations and external stressors. Patient reports that she is  still experiencing stress associated with continuing chronic pain, and financial stress. Patient reports that her husband is still in the process of fixing up a family home to rent out, and already has one renter for the home. 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 Patient reports that she is still on continued leave of absence through [employer]. Patient states that she had to go back because she thought that there were paperwork that she needed to sign, but when she ended up going to the facility, there was no papers to sign. 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 Patient reports that she doesn't know where the lab coats are currently, but we'll turn them in when she can locate them.  Allowed patient to explore her work related situation, and the overall psychological impact of herself making the decision to go on a leave of absence.   Patient reflects understanding and willingness to cooperate at time of session.  Continued recommendations are as follows: self care behaviors, positive social engagements, focusing on overall work/home/life balance, and focusing on positive physical and emotional wellness.  .  Suicidal/Homicidal: No  Therapist Response: Pt is continuing to apply interventions learned in session into daily life situations. Pt is currently on track to meet goals utilizing interventions mentioned above. Personal growth and progress noted. Treatment to continue as indicated.   Plan: Return again PRN--pt has new psychotherapist  Diagnosis: Adjustment disorder with mixed anxiety and depressed mood  Collaboration of Care: Other Pt encouraged to continue psychiatric care with Dr. Shea Evans and continue with neuropsychiatry follow ups  Patient/Guardian was advised Release of Information must be obtained prior to any record release in order  to collaborate their  care with an outside provider. Patient/Guardian was advised if they have not already done so to contact the registration department to sign all necessary forms in order for [place] to release information regarding their care.   Consent: Patient/Guardian gives verbal consent for treatment and assignment of benefits for services provided during this visit. Patient/Guardian expressed understanding and agreed to proceed.   [place], LCSW 12/05/2021

## 2021-12-06 NOTE — Plan of Care (Signed)
  Problem: Depression CCP Problem  1 Decrease depressive symptoms and improve levels of effective functioning  Goal: LTG: Reduce frequency, intensity, and duration of depression symptoms as evidenced by: SSB input needed on appropriate metric--pt self report Outcome: Progressing Goal: STG: Melissa Arroyo WILL PARTICIPATE IN AT LEAST 80% OF SCHEDULED INDIVIDUAL PSYCHOTHERAPY SESSIONS Outcome: Progressing Intervention: Encourage verbalization of feelings/concerns/expectations   Problem: Anxiety Disorder CCP Problem  1 Reduce overall frequency, intensity, and duration of the anxiety so that daily functioning is not impaired.  Goal: LTG: Patient will score less than 5 on the Generalized Anxiety Disorder 7 Scale (GAD-7) Outcome: Progressing Intervention: Assist with relaxation techniques, as appropriate (deep breathing exercises, meditation, guided imagery)

## 2021-12-12 DIAGNOSIS — F4312 Post-traumatic stress disorder, chronic: Secondary | ICD-10-CM | POA: Diagnosis not present

## 2021-12-12 DIAGNOSIS — F332 Major depressive disorder, recurrent severe without psychotic features: Secondary | ICD-10-CM | POA: Diagnosis not present

## 2021-12-16 DIAGNOSIS — M533 Sacrococcygeal disorders, not elsewhere classified: Secondary | ICD-10-CM | POA: Diagnosis not present

## 2021-12-16 DIAGNOSIS — M5459 Other low back pain: Secondary | ICD-10-CM | POA: Diagnosis not present

## 2021-12-16 DIAGNOSIS — M6281 Muscle weakness (generalized): Secondary | ICD-10-CM | POA: Diagnosis not present

## 2021-12-19 DIAGNOSIS — F4312 Post-traumatic stress disorder, chronic: Secondary | ICD-10-CM | POA: Diagnosis not present

## 2021-12-19 DIAGNOSIS — F39 Unspecified mood [affective] disorder: Secondary | ICD-10-CM | POA: Diagnosis not present

## 2021-12-19 DIAGNOSIS — G4701 Insomnia due to medical condition: Secondary | ICD-10-CM | POA: Diagnosis not present

## 2021-12-19 DIAGNOSIS — F410 Panic disorder [episodic paroxysmal anxiety] without agoraphobia: Secondary | ICD-10-CM | POA: Diagnosis not present

## 2021-12-20 ENCOUNTER — Telehealth: Payer: Self-pay | Admitting: Family Medicine

## 2021-12-20 NOTE — Telephone Encounter (Signed)
[employer] faxed over disability forms on 12/16/2021 and patient is checking on the status. Please follow up to inform patient if forms were received.

## 2021-12-26 DIAGNOSIS — F332 Major depressive disorder, recurrent severe without psychotic features: Secondary | ICD-10-CM | POA: Diagnosis not present

## 2021-12-26 DIAGNOSIS — F4312 Post-traumatic stress disorder, chronic: Secondary | ICD-10-CM | POA: Diagnosis not present

## 2021-12-30 DIAGNOSIS — M5459 Other low back pain: Secondary | ICD-10-CM | POA: Diagnosis not present

## 2021-12-30 NOTE — Telephone Encounter (Signed)
Pt called to see if her disability paper work was completed / please advise asap / deadline is Dec 2nd /

## 2021-12-31 NOTE — Telephone Encounter (Signed)
Called patient to let her know we faxed this 2 weeks ago.

## 2022-01-07 NOTE — Telephone Encounter (Signed)
The patient has called to follow up on the completion of their paperwork   Please contact further when possible

## 2022-01-07 NOTE — Telephone Encounter (Signed)
Pt called in to follow up on paperwork. Pt says that she had Manufacturing engineer to resend paperwork to PCP because they have not received the initial paperwork that was faxed. This should have been faxed to PCP on Wednesday 11/22.  Please advise/ assist further.

## 2022-01-08 NOTE — Telephone Encounter (Signed)
Spoke with patient and advised that it was sent 3 weeks ago, but I will print what we have scanned into her chart for her as I do not see another form that was sent last week.

## 2022-01-09 DIAGNOSIS — F332 Major depressive disorder, recurrent severe without psychotic features: Secondary | ICD-10-CM | POA: Diagnosis not present

## 2022-01-09 DIAGNOSIS — F4312 Post-traumatic stress disorder, chronic: Secondary | ICD-10-CM | POA: Diagnosis not present

## 2022-01-13 DIAGNOSIS — M5459 Other low back pain: Secondary | ICD-10-CM | POA: Diagnosis not present

## 2022-01-14 NOTE — Telephone Encounter (Signed)
Pt called for updates on her disability claim with [employer]. She says she will come in this afternoon to resolve.   Best contact: [phone]

## 2022-01-16 NOTE — Telephone Encounter (Signed)
Patient called in yesterday stating [facility] received the forms.

## 2022-01-23 DIAGNOSIS — F4312 Post-traumatic stress disorder, chronic: Secondary | ICD-10-CM | POA: Diagnosis not present

## 2022-01-23 DIAGNOSIS — F332 Major depressive disorder, recurrent severe without psychotic features: Secondary | ICD-10-CM | POA: Diagnosis not present

## 2022-01-27 DIAGNOSIS — M5459 Other low back pain: Secondary | ICD-10-CM | POA: Diagnosis not present

## 2022-01-30 DIAGNOSIS — F3341 Major depressive disorder, recurrent, in partial remission: Secondary | ICD-10-CM | POA: Diagnosis not present

## 2022-01-30 DIAGNOSIS — F410 Panic disorder [episodic paroxysmal anxiety] without agoraphobia: Secondary | ICD-10-CM | POA: Diagnosis not present

## 2022-01-30 DIAGNOSIS — F900 Attention-deficit hyperactivity disorder, predominantly inattentive type: Secondary | ICD-10-CM | POA: Diagnosis not present

## 2022-01-30 DIAGNOSIS — F3176 Bipolar disorder, in full remission, most recent episode depressed: Secondary | ICD-10-CM | POA: Diagnosis not present

## 2022-01-30 DIAGNOSIS — F4011 Social phobia, generalized: Secondary | ICD-10-CM | POA: Diagnosis not present

## 2022-02-11 DIAGNOSIS — F4312 Post-traumatic stress disorder, chronic: Secondary | ICD-10-CM | POA: Diagnosis not present

## 2022-02-11 DIAGNOSIS — F39 Unspecified mood [affective] disorder: Secondary | ICD-10-CM | POA: Diagnosis not present

## 2022-02-11 DIAGNOSIS — G4701 Insomnia due to medical condition: Secondary | ICD-10-CM | POA: Diagnosis not present

## 2022-02-11 DIAGNOSIS — F410 Panic disorder [episodic paroxysmal anxiety] without agoraphobia: Secondary | ICD-10-CM | POA: Diagnosis not present

## 2022-02-13 ENCOUNTER — Telehealth: Payer: Self-pay

## 2022-02-13 ENCOUNTER — Other Ambulatory Visit: Payer: Self-pay | Admitting: Family Medicine

## 2022-02-13 DIAGNOSIS — I10 Essential (primary) hypertension: Secondary | ICD-10-CM

## 2022-02-13 NOTE — Telephone Encounter (Signed)
Rx 05/06/21 #180 3RF- too soon Requested Prescriptions  Pending Prescriptions Disp Refills   propranolol (INDERAL) 10 MG tablet [Pharmacy Med Name: PROPRANOLOL '10MG'$  TABLETS] 180 tablet 3    Sig: TAKE 1 TABLET(10 MG) BY MOUTH TWICE DAILY     Cardiovascular:  Beta Blockers Passed - 02/13/2022  7:05 AM      Passed - Last BP in normal range    BP Readings from Last 1 Encounters:  09/04/21 (!) 113/52         Passed - Last Heart Rate in normal range    Pulse Readings from Last 1 Encounters:  09/04/21 (!) 55         Passed - Valid encounter within last 6 months    Recent Outpatient Visits           5 months ago Hypertension associated with diabetes [facility])   [facility] Tally Joe T, FNP   7 months ago Diarrhea due to malabsorption   [facility] Tally Joe T, FNP   8 months ago Type 2 diabetes mellitus with microalbuminuria, without long-term current use of insulin [facility])   [facility] [name], FNP   9 months ago Cognitive impairment   [facility] [name], FNP   10 months ago Memory deficits   [facility] [name], [place]       Future Appointments             In 6 days [name], [place], [place]

## 2022-02-13 NOTE — Telephone Encounter (Signed)
Copied from [place] [id number]. Topic: General - Other >> Feb 13, 2022  2:41 PM Everette C wrote: Reason for CRM: The patient has made contact to follow up on their previous refill request of propranolol (INDERAL) 10 MG tablet [[id number]]  Please contact the patient further when possible to discuss their refill further

## 2022-02-14 DIAGNOSIS — F419 Anxiety disorder, unspecified: Secondary | ICD-10-CM | POA: Diagnosis not present

## 2022-02-14 DIAGNOSIS — F319 Bipolar disorder, unspecified: Secondary | ICD-10-CM | POA: Diagnosis not present

## 2022-02-14 DIAGNOSIS — R4189 Other symptoms and signs involving cognitive functions and awareness: Secondary | ICD-10-CM | POA: Diagnosis not present

## 2022-02-14 DIAGNOSIS — M545 Low back pain, unspecified: Secondary | ICD-10-CM | POA: Diagnosis not present

## 2022-02-14 DIAGNOSIS — G8929 Other chronic pain: Secondary | ICD-10-CM | POA: Diagnosis not present

## 2022-02-17 ENCOUNTER — Ambulatory Visit: Payer: Medicare Other | Admitting: Family Medicine

## 2022-02-18 NOTE — Progress Notes (Unsigned)
Established patient vis Patient: Melissa Arroyo   DOB: [birth year]   72 y.o. Female  MRN: [id number] Visit Date:02/19/2022  Today's healthcare provider: [name], FNP  Introduced to nurse practitioner role and practice setting.  All questions answered.  Discussed provider/patient relationship and expectations.  Chief Complaint  Patient presents with   Hypertension   Subjective    HPI HPI     Hypertension   This is a chronic problem.  Recent episode started more than a year ago.  The problem is controlled.  Compliance with treatment is excellent.      Last edited by Clista Bernhardt, CMA on 02/19/2022  9:03 AM.      Diabetes Mellitus Type II, follow-up  Lab Results  Component Value Date   HGBA1C 6.6 (H) 09/04/2021   HGBA1C 7.1 (H) 02/19/2021   HGBA1C 6.7 (H) 11/19/2020   Last seen for diabetes 6 months ago.  Management since then includes continuing the same treatment. She reports excellent compliance with treatment. She is not having side effects.   Home blood sugar records: fasting range: 140s ; anticipated elevated BG levels since start of Cymbalta to assist mental health concerns (followed by Nicolasa Ducking MD)  Episodes of hypoglycemia? No     Current insulin regiment: none Most Recent Eye Exam: UTD  --------------------------------------------------------------------------------------------------- Hypertension, follow-up  BP Readings from Last 3 Encounters:  02/19/22 128/61  09/04/21 (!) 113/52  07/02/21 140/62   Wt Readings from Last 3 Encounters:  02/19/22 208 lb (94.3 kg)  09/04/21 208 lb (94.3 kg)  07/02/21 206 lb 3.2 oz (93.5 kg)     She was last seen for hypertension 6 months ago.  BP at that visit was 113/52. Management since that visit includes continue current treatment. She reports excellent compliance with treatment. She is not having side effects.  She is not exercising. She is not adherent to low salt diet.   Outside blood pressures  are not being checked.  She does not smoke.  Use of agents associated with hypertension: none.   --------------------------------------------------------------------------------------------------- Lipid/Cholesterol, follow-up  Last Lipid Panel: Lab Results  Component Value Date   CHOL 169 02/19/2021   LDLCALC 95 02/19/2021   HDL 51 02/19/2021   TRIG 130 02/19/2021    She was last seen for this 6 months ago.  Management since that visit includes continue current treatment.  She reports good compliance with treatment. She is not having side effects.   Symptoms: No appetite changes No foot ulcerations  No chest pain No chest pressure/discomfort  No dyspnea No orthopnea  No fatigue No lower extremity edema  No palpitations No paroxysmal nocturnal dyspnea  No nausea No numbness or tingling of extremity  No polydipsia No polyuria  No speech difficulty No syncope   She is following a Regular diet. Current exercise: none  Last metabolic panel Lab Results  Component Value Date   GLUCOSE 130 (H) 02/19/2021   NA 138 02/19/2021   K 4.9 02/19/2021   BUN 13 02/19/2021   CREATININE 0.89 02/19/2021   EGFR 70 02/19/2021   GFRNONAA 74 04/18/2019   CALCIUM 10.1 02/19/2021   AST 68 (H) 02/19/2021   ALT 45 (H) 02/19/2021   The 10-year ASCVD risk score (Arnett DK, et al., 2019) is: 22.2%  ---------------------------------------------------------------------------------------------------   Medications: Outpatient Medications Prior to Visit  Medication Sig   ACCU-CHEK GUIDE test strip USE TO TEST ONCE DAILY   Alcohol Swabs (ALCOHOL PREP) PADS Use 1 pad prior to  checking blood sugar to clean skin   atorvastatin (LIPITOR) 40 MG tablet TAKE 1 TABLET(40 MG) BY MOUTH DAILY   b complex vitamins capsule Take 1 capsule by mouth daily.   blood glucose meter kit and supplies Dispense based on patient and insurance preference. Check glucose once daily. (FOR ICD-10 E11.29).   chlorhexidine  (PERIDEX) 0.12 % solution SMARTSIG:By Mouth   Cholecalciferol (VITAMIN D3) 10 MCG (400 UNIT) tablet Take by mouth daily. Dose unknown   cyclobenzaprine (FLEXERIL) 5 MG tablet Take 1 tablet (5 mg total) by mouth 3 (three) times daily as needed for muscle spasms (back pain, start with PM dosing).   DULoxetine (CYMBALTA) 60 MG capsule Take 60 mg by mouth daily.   estradiol (ESTRACE) 0.1 MG/GM vaginal cream Place 1 Applicatorful vaginally at bedtime.   FLUoxetine (PROZAC) 20 MG capsule Take 3 capsules (60 mg total) by mouth daily.   FLUoxetine (PROZAC) 40 MG capsule Take 40 mg by mouth daily.   fluticasone (FLONASE SENSIMIST) 27.5 MCG/SPRAY nasal spray Place 1-2 sprays into the nose daily.   glipiZIDE (GLUCOTROL XL) 10 MG 24 hr tablet TAKE 1 TABLET(10 MG) BY MOUTH DAILY WITH BREAKFAST   Glucosamine-Chondroitin (MOVE FREE PO) Take 1 tablet by mouth daily.   ibuprofen (ADVIL,MOTRIN) 200 MG tablet Take 400 mg by mouth at bedtime. As needed   lisinopril (ZESTRIL) 5 MG tablet TAKE 1 TO 2 TABLETS DAILY BASED ON BLOOD PRESSURE. GOAL IS< 130/80. PLEASE LET PROVIDER KNOW IF BLOOD PRESSURE IS CONSISENTLY ABOVE THIS GOAL.   meloxicam (MOBIC) 7.5 MG tablet Take 2 tablets (15 mg total) by mouth daily. Avoid other NSAIDs, including Ibuprofen and Naproxen. Take with food/milk.   metFORMIN (GLUCOPHAGE-XR) 500 MG 24 hr tablet Take 1 tablet (500 mg total) by mouth 2 (two) times daily after a meal.   Multiple Vitamins-Minerals (CENTRUM SILVER PO) Take 1 tablet by mouth daily.   OLANZapine (ZYPREXA) 5 MG tablet Take 7.5 mg by mouth at bedtime.   pioglitazone (ACTOS) 30 MG tablet TAKE 1 TABLET(30 MG) BY MOUTH DAILY   propranolol (INDERAL) 10 MG tablet TAKE 1 TABLET(10 MG) BY MOUTH TWICE DAILY   zinc gluconate 50 MG tablet Take by mouth daily. Dose unknown   [DISCONTINUED] buPROPion (WELLBUTRIN XL) 300 MG 24 hr tablet Take 300 mg by mouth every morning.   [DISCONTINUED] dicyclomine (BENTYL) 10 MG capsule Take 1 capsule  (10 mg total) by mouth 4 (four) times daily -  before meals and at bedtime.   [DISCONTINUED] donepezil (ARICEPT) 5 MG tablet Take 1 tablet by mouth daily.   No facility-administered medications prior to visit.    Review of Systems  Last CBC Lab Results  Component Value Date   WBC 7.1 05/15/2020   HGB 13.2 05/15/2020   HCT 39.7 05/15/2020   MCV 94 05/15/2020   MCH 31.4 05/15/2020   RDW 13.2 05/15/2020   PLT 282 76/22/6333   Last metabolic panel Lab Results  Component Value Date   GLUCOSE 130 (H) 02/19/2021   NA 138 02/19/2021   K 4.9 02/19/2021   CL 96 02/19/2021   CO2 27 02/19/2021   BUN 13 02/19/2021   CREATININE 0.89 02/19/2021   EGFR 70 02/19/2021   CALCIUM 10.1 02/19/2021   PROT 7.5 02/19/2021   ALBUMIN 4.1 02/19/2021   LABGLOB 3.4 02/19/2021   AGRATIO 1.2 02/19/2021   BILITOT 0.2 02/19/2021   ALKPHOS 147 (H) 02/19/2021   AST 68 (H) 02/19/2021   ALT 45 (H) 02/19/2021  ANIONGAP 10 07/31/2018   Last lipids Lab Results  Component Value Date   CHOL 169 02/19/2021   HDL 51 02/19/2021   LDLCALC 95 02/19/2021   TRIG 130 02/19/2021   CHOLHDL 3.3 02/19/2021   Last hemoglobin A1c Lab Results  Component Value Date   HGBA1C 6.6 (H) 09/04/2021   Last thyroid functions Lab Results  Component Value Date   TSH 4.270 02/19/2021   Last vitamin D Lab Results  Component Value Date   VD25OH 37 11/21/2016   Last vitamin B12 and Folate Lab Results  Component Value Date   VITAMINB12 389 11/19/2020       Objective    BP 128/61   Pulse 75   Ht '5\' 5"'$  (1.651 m)   Wt 208 lb (94.3 kg)   SpO2 97%   BMI 34.61 kg/m   BP Readings from Last 3 Encounters:  02/19/22 128/61  09/04/21 (!) 113/52  07/02/21 140/62   Wt Readings from Last 3 Encounters:  02/19/22 208 lb (94.3 kg)  09/04/21 208 lb (94.3 kg)  07/02/21 206 lb 3.2 oz (93.5 kg)   SpO2 Readings from Last 3 Encounters:  02/19/22 97%  09/04/21 98%  06/06/21 100%   Physical Exam Vitals and nursing  note reviewed.  Constitutional:      General: She is not in acute distress.    Appearance: Normal appearance. She is obese. She is not ill-appearing, toxic-appearing or diaphoretic.  HENT:     Head: Normocephalic and atraumatic.  Cardiovascular:     Rate and Rhythm: Normal rate and regular rhythm.     Pulses: Normal pulses.     Heart sounds: Normal heart sounds. No murmur heard.    No friction rub. No gallop.  Pulmonary:     Effort: Pulmonary effort is normal. No respiratory distress.     Breath sounds: Normal breath sounds. No stridor. No wheezing, rhonchi or rales.  Chest:     Chest wall: No tenderness.  Musculoskeletal:        General: No swelling, tenderness, deformity or signs of injury. Normal range of motion.     Right lower leg: Edema present.     Left lower leg: Edema present.     Comments: Slight ankle edema; stable per pt report   Skin:    General: Skin is warm and dry.     Capillary Refill: Capillary refill takes less than 2 seconds.     Coloration: Skin is not jaundiced or pale.     Findings: No bruising, erythema, lesion or rash.  Neurological:     General: No focal deficit present.     Mental Status: She is alert and oriented to person, place, and time. Mental status is at baseline.     Cranial Nerves: No cranial nerve deficit.     Sensory: No sensory deficit.     Motor: No weakness.     Coordination: Coordination normal.  Psychiatric:        Mood and Affect: Mood normal.        Behavior: Behavior normal.        Thought Content: Thought content normal.        Judgment: Judgment normal.     No results found for any visits on 02/19/22.  Assessment & Plan     Problem List Items Addressed This Visit       Cardiovascular and Mediastinum   Hypertension associated with diabetes ([place]) - Primary    Chronic, stable Goal <130/<80 Continue Lisinopril  10 mg Continue propranolol 10 mg BID      Relevant Orders   Comprehensive Metabolic Panel (CMET)   CBC      Endocrine   Hyperlipidemia associated with type 2 diabetes mellitus (HCC)    Chronic, previously elevated Last LDL of 95 Goal <70 LDL On lipitor 40      Relevant Orders   Lipid panel   Type 2 diabetes mellitus with microalbuminuria, without long-term current use of insulin (HCC)    Chronic, previously stable <7% Repeat A1c Continue to recommend balanced, lower carb meals. Smaller meal size, adding snacks. Choosing water as drink of choice and increasing purposeful exercise. On glipizide 10, On Metformin 500 BID, On Actos 30       Relevant Orders   Urine Microalbumin w/creat. ratio   Hemoglobin A1c     Other   Depression, recurrent (HCC)    Chronic, well controlled Followed by Nicolasa Ducking MD      Relevant Medications   DULoxetine (CYMBALTA) 60 MG capsule   Screening for colon cancer    Due for repeat cologuard 4/24      Relevant Orders   Cologuard   Return in about 3 months (around 05/21/2022) for annual examination.     Vonna Kotyk, FNP, have reviewed all documentation for this visit. The documentation on 02/19/22 for the exam, diagnosis, procedures, and orders are all accurate and complete.  [name], [place] [phone] (phone) [phone] (fax)  [place]

## 2022-02-19 ENCOUNTER — Ambulatory Visit (INDEPENDENT_AMBULATORY_CARE_PROVIDER_SITE_OTHER): Payer: Medicare Other | Admitting: Family Medicine

## 2022-02-19 ENCOUNTER — Encounter: Payer: Self-pay | Admitting: Family Medicine

## 2022-02-19 VITALS — BP 128/61 | HR 75 | Ht 65.0 in | Wt 208.0 lb

## 2022-02-19 DIAGNOSIS — E785 Hyperlipidemia, unspecified: Secondary | ICD-10-CM

## 2022-02-19 DIAGNOSIS — F339 Major depressive disorder, recurrent, unspecified: Secondary | ICD-10-CM

## 2022-02-19 DIAGNOSIS — E1159 Type 2 diabetes mellitus with other circulatory complications: Secondary | ICD-10-CM

## 2022-02-19 DIAGNOSIS — E1169 Type 2 diabetes mellitus with other specified complication: Secondary | ICD-10-CM | POA: Diagnosis not present

## 2022-02-19 DIAGNOSIS — E1129 Type 2 diabetes mellitus with other diabetic kidney complication: Secondary | ICD-10-CM | POA: Diagnosis not present

## 2022-02-19 DIAGNOSIS — R809 Proteinuria, unspecified: Secondary | ICD-10-CM | POA: Diagnosis not present

## 2022-02-19 DIAGNOSIS — I152 Hypertension secondary to endocrine disorders: Secondary | ICD-10-CM | POA: Diagnosis not present

## 2022-02-19 DIAGNOSIS — Z1211 Encounter for screening for malignant neoplasm of colon: Secondary | ICD-10-CM | POA: Diagnosis not present

## 2022-02-19 NOTE — Assessment & Plan Note (Signed)
Chronic, well controlled Followed by Nicolasa Ducking MD

## 2022-02-19 NOTE — Assessment & Plan Note (Signed)
Due for repeat cologuard 4/24

## 2022-02-19 NOTE — Assessment & Plan Note (Addendum)
Chronic, previously elevated Last LDL of 95 Goal <70 LDL On lipitor 40

## 2022-02-19 NOTE — Assessment & Plan Note (Signed)
Chronic, stable Goal <130/<80 Continue Lisinopril 10 mg Continue propranolol 10 mg BID

## 2022-02-19 NOTE — Assessment & Plan Note (Addendum)
Chronic, previously stable <7% Repeat A1c Continue to recommend balanced, lower carb meals. Smaller meal size, adding snacks. Choosing water as drink of choice and increasing purposeful exercise. On glipizide 10, On Metformin 500 BID, On Actos 30

## 2022-02-20 DIAGNOSIS — F4312 Post-traumatic stress disorder, chronic: Secondary | ICD-10-CM | POA: Diagnosis not present

## 2022-02-20 DIAGNOSIS — F332 Major depressive disorder, recurrent severe without psychotic features: Secondary | ICD-10-CM | POA: Diagnosis not present

## 2022-02-20 LAB — MICROALBUMIN / CREATININE URINE RATIO
Creatinine, Urine: 87.5 mg/dL
Microalb/Creat Ratio: <3 mg/g creat (ref 0–29)
Microalbumin, Urine: <3 ug/mL

## 2022-02-20 LAB — COMPREHENSIVE METABOLIC PANEL
ALT: 55 IU/L — ABNORMAL HIGH (ref 0–32)
AST: 38 IU/L (ref 0–40)
Albumin/Globulin Ratio: 1.5 (ref 1.2–2.2)
Albumin: 4.1 g/dL (ref 3.9–4.9)
Alkaline Phosphatase: 121 IU/L (ref 44–121)
BUN/Creatinine Ratio: 20 (ref 12–28)
BUN: 17 mg/dL (ref 8–27)
Bilirubin Total: 0.3 mg/dL (ref 0.0–1.2)
CO2: 25 mmol/L (ref 20–29)
Calcium: 9.7 mg/dL (ref 8.7–10.3)
Chloride: 101 mmol/L (ref 96–106)
Creatinine, Ser: 0.84 mg/dL (ref 0.57–1.00)
Globulin, Total: 2.7 g/dL (ref 1.5–4.5)
Glucose: 168 mg/dL — ABNORMAL HIGH (ref 70–99)
Potassium: 4.7 mmol/L (ref 3.5–5.2)
Sodium: 140 mmol/L (ref 134–144)
Total Protein: 6.8 g/dL (ref 6.0–8.5)
eGFR: 75 mL/min/{1.73_m2} (ref >59)

## 2022-02-20 LAB — CBC
Hematocrit: 37.4 % (ref 34.0–46.6)
Hemoglobin: 12.1 g/dL (ref 11.1–15.9)
MCH: 31 pg (ref 26.6–33.0)
MCHC: 32.4 g/dL (ref 31.5–35.7)
MCV: 96 fL (ref 79–97)
Platelets: 292 10*3/uL (ref 150–450)
RBC: 3.9 x10E6/uL (ref 3.77–5.28)
RDW: 12.8 % (ref 11.7–15.4)
WBC: 7.8 10*3/uL (ref 3.4–10.8)

## 2022-02-20 LAB — LIPID PANEL
Chol/HDL Ratio: 2.3 ratio (ref 0.0–4.4)
Cholesterol, Total: 158 mg/dL (ref 100–199)
HDL: 68 mg/dL (ref >39)
LDL Chol Calc (NIH): 72 mg/dL (ref 0–99)
Triglycerides: 100 mg/dL (ref 0–149)
VLDL Cholesterol Cal: 18 mg/dL (ref 5–40)

## 2022-02-20 LAB — HEMOGLOBIN A1C
Est. average glucose Bld gHb Est-mCnc: 163 mg/dL
Hgb A1c MFr Bld: 7.3 % — ABNORMAL HIGH (ref 4.8–5.6)

## 2022-02-20 NOTE — Progress Notes (Signed)
Slight increase in blood glucose/sugar levels and ALT, liver enzyme. Continue to recommend balanced, lower carb meals. Smaller meal size, adding snacks. Choosing water as drink of choice and increasing purposeful exercise. A1c is increased to 7.3%, Goal <7%. Cholesterol looks great, normal cell count. Urine Pending

## 2022-03-06 DIAGNOSIS — F4312 Post-traumatic stress disorder, chronic: Secondary | ICD-10-CM | POA: Diagnosis not present

## 2022-03-06 DIAGNOSIS — F332 Major depressive disorder, recurrent severe without psychotic features: Secondary | ICD-10-CM | POA: Diagnosis not present

## 2022-03-20 DIAGNOSIS — F332 Major depressive disorder, recurrent severe without psychotic features: Secondary | ICD-10-CM | POA: Diagnosis not present

## 2022-03-20 DIAGNOSIS — F4312 Post-traumatic stress disorder, chronic: Secondary | ICD-10-CM | POA: Diagnosis not present

## 2022-03-20 DIAGNOSIS — F900 Attention-deficit hyperactivity disorder, predominantly inattentive type: Secondary | ICD-10-CM | POA: Diagnosis not present

## 2022-04-10 DIAGNOSIS — F900 Attention-deficit hyperactivity disorder, predominantly inattentive type: Secondary | ICD-10-CM | POA: Diagnosis not present

## 2022-04-10 DIAGNOSIS — F332 Major depressive disorder, recurrent severe without psychotic features: Secondary | ICD-10-CM | POA: Diagnosis not present

## 2022-04-10 DIAGNOSIS — F4312 Post-traumatic stress disorder, chronic: Secondary | ICD-10-CM | POA: Diagnosis not present

## 2022-04-16 DIAGNOSIS — F39 Unspecified mood [affective] disorder: Secondary | ICD-10-CM | POA: Diagnosis not present

## 2022-04-16 DIAGNOSIS — F900 Attention-deficit hyperactivity disorder, predominantly inattentive type: Secondary | ICD-10-CM | POA: Diagnosis not present

## 2022-04-16 DIAGNOSIS — F4312 Post-traumatic stress disorder, chronic: Secondary | ICD-10-CM | POA: Diagnosis not present

## 2022-04-16 DIAGNOSIS — F410 Panic disorder [episodic paroxysmal anxiety] without agoraphobia: Secondary | ICD-10-CM | POA: Diagnosis not present

## 2022-04-24 DIAGNOSIS — F332 Major depressive disorder, recurrent severe without psychotic features: Secondary | ICD-10-CM | POA: Diagnosis not present

## 2022-04-24 DIAGNOSIS — F900 Attention-deficit hyperactivity disorder, predominantly inattentive type: Secondary | ICD-10-CM | POA: Diagnosis not present

## 2022-04-24 DIAGNOSIS — F4312 Post-traumatic stress disorder, chronic: Secondary | ICD-10-CM | POA: Diagnosis not present

## 2022-05-02 ENCOUNTER — Telehealth: Payer: Self-pay | Admitting: Family Medicine

## 2022-05-02 NOTE — Telephone Encounter (Signed)
Long Term Disability paperwork was received on 05/02/2022 and placed in provider's mailbox. Please allow 7-10 business days to be completed. Thank you

## 2022-05-06 ENCOUNTER — Other Ambulatory Visit: Payer: Self-pay | Admitting: Family Medicine

## 2022-05-06 DIAGNOSIS — R809 Proteinuria, unspecified: Secondary | ICD-10-CM

## 2022-05-06 NOTE — Telephone Encounter (Signed)
Unable to reach pt regarding rx request

## 2022-05-07 ENCOUNTER — Other Ambulatory Visit: Payer: Self-pay | Admitting: Family Medicine

## 2022-05-07 ENCOUNTER — Ambulatory Visit: Payer: Self-pay | Admitting: *Deleted

## 2022-05-07 DIAGNOSIS — F410 Panic disorder [episodic paroxysmal anxiety] without agoraphobia: Secondary | ICD-10-CM | POA: Diagnosis not present

## 2022-05-07 DIAGNOSIS — F39 Unspecified mood [affective] disorder: Secondary | ICD-10-CM | POA: Diagnosis not present

## 2022-05-07 DIAGNOSIS — G4701 Insomnia due to medical condition: Secondary | ICD-10-CM | POA: Diagnosis not present

## 2022-05-07 DIAGNOSIS — F4312 Post-traumatic stress disorder, chronic: Secondary | ICD-10-CM | POA: Diagnosis not present

## 2022-05-07 DIAGNOSIS — E1129 Type 2 diabetes mellitus with other diabetic kidney complication: Secondary | ICD-10-CM

## 2022-05-07 NOTE — Telephone Encounter (Signed)
Attempted to call patient on alternate # [phone] listed as spouse on DPR. No answer, LVMTCB.

## 2022-05-07 NOTE — Telephone Encounter (Signed)
Medication Refill - Medication: blood glucose meter kit and supplies /Metformin 500mg   Has the patient contacted their pharmacy? yes (Agent: If no, request that the patient contact the pharmacy for the refill. If patient does not wish to contact the pharmacy document the reason why and proceed with request.) (Agent: If yes, when and what did the pharmacy advise?)contact pcp  Preferred Pharmacy (with phone number or street name):  [facility] STORE [place], [address] Phone: [phone]  Fax: [phone]     Has the patient been seen for an appointment in the last year OR does the patient have an upcoming appointment? yes  Agent: Please be advised that RX refills may take up to 3 business days. We ask that you follow-up with your pharmacy.

## 2022-05-07 NOTE — Telephone Encounter (Signed)
Patient called in states not sure of what dosage of Metformin she is supposed to be taking, 500mg  or 1000   Attempted to call patient- recording"call can not be completed as dialed please try again"- x 3

## 2022-05-08 ENCOUNTER — Other Ambulatory Visit: Payer: Self-pay

## 2022-05-08 ENCOUNTER — Other Ambulatory Visit: Payer: Self-pay | Admitting: Family Medicine

## 2022-05-08 DIAGNOSIS — R809 Proteinuria, unspecified: Secondary | ICD-10-CM

## 2022-05-08 DIAGNOSIS — F900 Attention-deficit hyperactivity disorder, predominantly inattentive type: Secondary | ICD-10-CM | POA: Diagnosis not present

## 2022-05-08 DIAGNOSIS — F4312 Post-traumatic stress disorder, chronic: Secondary | ICD-10-CM | POA: Diagnosis not present

## 2022-05-08 DIAGNOSIS — F332 Major depressive disorder, recurrent severe without psychotic features: Secondary | ICD-10-CM | POA: Diagnosis not present

## 2022-05-08 MED ORDER — BLOOD GLUCOSE METER KIT: PACK | 0 refills | Status: DC

## 2022-05-08 NOTE — Telephone Encounter (Signed)
Requested medication (s) are due for refill today:   Yes for metformin depending on dose 500 mg or 1,000 mg.   Not sure about the glucose meter kit and supplies this was issued on 10/26/2020  1 each, 0 refills.   Provider to review  Requested medication (s) are on the active medication list:   Yes  Future visit scheduled:   Yes 05/21/2022 with Daneil Dan   Last ordered: Glucose meter kit and supplies see above;   Metformin 500 mg 09/04/2021 #180, 3 refills  Returned because pt needing clarification on the dose of the metformin.   500 mg or 1,000 mg?    Several attempts made to contact pt however been unsuccessful.      Requested Prescriptions  Pending Prescriptions Disp Refills   blood glucose meter kit and supplies 1 each 0    Sig: Dispense based on patient and insurance preference. Check glucose once daily. (FOR ICD-10 E11.29).     Endocrinology: Diabetes - Testing Supplies Passed - 05/07/2022 11:47 AM      Passed - Valid encounter within last 12 months    Recent Outpatient Visits           2 months ago Hypertension associated with diabetes [facility])   [place] Tally Joe T, FNP   8 months ago Hypertension associated with diabetes [facility])   [place] Tally Joe T, FNP   10 months ago Diarrhea due to malabsorption   [facility] Tally Joe T, FNP   11 months ago Type 2 diabetes mellitus with microalbuminuria, without long-term current use of insulin ([place])   [place] Tally Joe T, FNP   1 year ago Cognitive impairment   [facility] Tally Joe T, FNP               metFORMIN (GLUCOPHAGE-XR) 500 MG 24 hr tablet 180 tablet 3    Sig: Take 1 tablet (500 mg total) by mouth 2 (two) times daily after a meal.     Endocrinology:  Diabetes - Biguanides Failed - 05/07/2022 11:47 AM      Failed - CBC within normal limits and completed in the last 12  months    WBC  Date Value Ref Range Status  02/19/2022 7.8 3.4 - 10.8 x10E3/uL Final  07/31/2018 8.9 4.0 - 10.5 K/uL Final   RBC  Date Value Ref Range Status  02/19/2022 3.90 3.77 - 5.28 x10E6/uL Final  07/31/2018 4.25 3.87 - 5.11 MIL/uL Final   Hemoglobin  Date Value Ref Range Status  02/19/2022 12.1 11.1 - 15.9 g/dL Final   Hematocrit  Date Value Ref Range Status  02/19/2022 37.4 34.0 - 46.6 % Final   MCHC  Date Value Ref Range Status  02/19/2022 32.4 31.5 - 35.7 g/dL Final  07/31/2018 32.8 30.0 - 36.0 g/dL Final   [facility]  Date Value Ref Range Status  02/19/2022 31.0 26.6 - 33.0 pg Final  07/31/2018 31.1 26.0 - 34.0 pg Final   MCV  Date Value Ref Range Status  02/19/2022 96 79 - 97 fL Final   No results found for: "PLTCOUNTKUC", "LABPLAT", "POCPLA" RDW  Date Value Ref Range Status  02/19/2022 12.8 11.7 - 15.4 % Final         Passed - Cr in normal range and within 360 days    Creat  Date Value Ref Range Status  11/21/2016 0.76 0.50 - 0.99 mg/dL Final  Comment:    For patients >71 years of age, the reference limit for Creatinine is approximately 13% higher for people identified as African-American. .    Creatinine, Ser  Date Value Ref Range Status  02/19/2022 0.84 0.57 - 1.00 mg/dL Final   Creatinine, POC  Date Value Ref Range Status  09/27/2015 NA mg/dL Final         Passed - HBA1C is between 0 and 7.9 and within 180 days    Hgb A1c MFr Bld  Date Value Ref Range Status  02/19/2022 7.3 (H) 4.8 - 5.6 % Final    Comment:             Prediabetes: 5.7 - 6.4          Diabetes: >6.4          Glycemic control for adults with diabetes: <7.0          Passed - eGFR in normal range and within 360 days    GFR, Est African American  Date Value Ref Range Status  11/21/2016 96 > OR = 60 mL/min/1.94m2 Final   GFR calc Af Amer  Date Value Ref Range Status  04/18/2019 86 >59 mL/min/1.73 Final   GFR, Est Non African American  Date Value Ref Range Status   11/21/2016 83 > OR = 60 mL/min/1.35m2 Final   GFR calc non Af Amer  Date Value Ref Range Status  04/18/2019 74 >59 mL/min/1.73 Final   eGFR  Date Value Ref Range Status  02/19/2022 75 >59 mL/min/1.73 Final         Passed - B12 Level in normal range and within 720 days    Vitamin B-12  Date Value Ref Range Status  11/19/2020 389 232 - 1,245 pg/mL Final         Passed - Valid encounter within last 6 months    Recent Outpatient Visits           2 months ago Hypertension associated with diabetes [facility])   [place] Tally Joe T, FNP   8 months ago Hypertension associated with diabetes [facility])   [place] Tally Joe T, FNP   10 months ago Diarrhea due to malabsorption   [facility] Tally Joe T, FNP   11 months ago Type 2 diabetes mellitus with microalbuminuria, without long-term current use of insulin [facility])   [place] Tally Joe T, FNP   1 year ago Cognitive impairment   [facility] [name], [place]

## 2022-05-09 ENCOUNTER — Telehealth: Payer: Self-pay | Admitting: Family Medicine

## 2022-05-09 NOTE — Telephone Encounter (Signed)
Medication Refill - Medication: metFORMIN (GLUCOPHAGE-XR) 500 MG 24 hr tablet  and  blood glucose meter kit and supplies   Has the patient contacted their pharmacy? Yes.    Preferred Pharmacy (with phone number or street name):  [facility] [id number] - [address]  [place] [address]  Phone: [phone] Fax: [phone]  Hours: Not open 24 hours    Has the patient been seen for an appointment in the last year OR does the patient have an upcoming appointment? Yes.    Agent: Please be advised that RX refills may take up to 3 business days. We ask that you follow-up with your pharmacy.

## 2022-05-09 NOTE — Telephone Encounter (Signed)
Per patient she is on the 1000 mg BID of Metformin and per last office notes she is on 500 mg BID. Patient states that she currently on the 1000 mg BID but if Daneil Dan wants her to do 500 mg BID she can do that. Please review and advise. Patient is aware provider not in the office and she verbalized understanding and will wait until next week for the provider.

## 2022-05-09 NOTE — Telephone Encounter (Signed)
Patient states that she has been taking metFORMIN (GLUCOPHAGE) 1000 MG tablet 2x daily not 500mg .

## 2022-05-09 NOTE — Telephone Encounter (Signed)
Unable to refill per protocol, Rx expired. Discontinued 06/06/21.  Requested Prescriptions  Pending Prescriptions Disp Refills   metFORMIN (GLUCOPHAGE) 1000 MG tablet [Pharmacy Med Name: METFORMIN 1000MG  TABLETS] 180 tablet 3    Sig: TAKE 1 TABLET(1000 MG) BY MOUTH TWICE DAILY WITH A MEAL     Endocrinology:  Diabetes - Biguanides Failed - 05/08/2022  4:20 PM      Failed - CBC within normal limits and completed in the last 12 months    WBC  Date Value Ref Range Status  02/19/2022 7.8 3.4 - 10.8 x10E3/uL Final  07/31/2018 8.9 4.0 - 10.5 K/uL Final   RBC  Date Value Ref Range Status  02/19/2022 3.90 3.77 - 5.28 x10E6/uL Final  07/31/2018 4.25 3.87 - 5.11 MIL/uL Final   Hemoglobin  Date Value Ref Range Status  02/19/2022 12.1 11.1 - 15.9 g/dL Final   Hematocrit  Date Value Ref Range Status  02/19/2022 37.4 34.0 - 46.6 % Final   MCHC  Date Value Ref Range Status  02/19/2022 32.4 31.5 - 35.7 g/dL Final  07/31/2018 32.8 30.0 - 36.0 g/dL Final   [facility]  Date Value Ref Range Status  02/19/2022 31.0 26.6 - 33.0 pg Final  07/31/2018 31.1 26.0 - 34.0 pg Final   MCV  Date Value Ref Range Status  02/19/2022 96 79 - 97 fL Final   No results found for: "PLTCOUNTKUC", "LABPLAT", "POCPLA" RDW  Date Value Ref Range Status  02/19/2022 12.8 11.7 - 15.4 % Final         Passed - Cr in normal range and within 360 days    Creat  Date Value Ref Range Status  11/21/2016 0.76 0.50 - 0.99 mg/dL Final    Comment:    For patients >46 years of age, the reference limit for Creatinine is approximately 13% higher for people identified as African-American. .    Creatinine, Ser  Date Value Ref Range Status  02/19/2022 0.84 0.57 - 1.00 mg/dL Final   Creatinine, POC  Date Value Ref Range Status  09/27/2015 NA mg/dL Final         Passed - HBA1C is between 0 and 7.9 and within 180 days    Hgb A1c MFr Bld  Date Value Ref Range Status  02/19/2022 7.3 (H) 4.8 - 5.6 % Final    Comment:              Prediabetes: 5.7 - 6.4          Diabetes: >6.4          Glycemic control for adults with diabetes: <7.0          Passed - eGFR in normal range and within 360 days    GFR, Est African American  Date Value Ref Range Status  11/21/2016 96 > OR = 60 mL/min/1.91m2 Final   GFR calc Af Amer  Date Value Ref Range Status  04/18/2019 86 >59 mL/min/1.73 Final   GFR, Est Non African American  Date Value Ref Range Status  11/21/2016 83 > OR = 60 mL/min/1.12m2 Final   GFR calc non Af Amer  Date Value Ref Range Status  04/18/2019 74 >59 mL/min/1.73 Final   eGFR  Date Value Ref Range Status  02/19/2022 75 >59 mL/min/1.73 Final         Passed - B12 Level in normal range and within 720 days    Vitamin B-12  Date Value Ref Range Status  11/19/2020 389 232 - 1,245 pg/mL Final  Passed - Valid encounter within last 6 months    Recent Outpatient Visits           2 months ago Hypertension associated with diabetes [facility])   [place] Tally Joe T, FNP   8 months ago Hypertension associated with diabetes [facility])   [place] Tally Joe T, FNP   10 months ago Diarrhea due to malabsorption   [facility] Tally Joe T, FNP   11 months ago Type 2 diabetes mellitus with microalbuminuria, without long-term current use of insulin [facility])   [place] [name], FNP   1 year ago Cognitive impairment   [facility] [name], FNP

## 2022-05-12 NOTE — Telephone Encounter (Signed)
Left detailed message per DPR. CRM created. OK for PEC to advise if patient returns call.

## 2022-05-14 ENCOUNTER — Other Ambulatory Visit: Payer: Self-pay | Admitting: Family Medicine

## 2022-05-14 DIAGNOSIS — E1129 Type 2 diabetes mellitus with other diabetic kidney complication: Secondary | ICD-10-CM

## 2022-05-14 NOTE — Telephone Encounter (Signed)
Requested Prescriptions  Pending Prescriptions Disp Refills   glipiZIDE (GLUCOTROL XL) 10 MG 24 hr tablet [Pharmacy Med Name: GLIPIZIDE ER 10MG  TABLETS] 90 tablet 1    Sig: TAKE 1 TABLET(10 MG) BY MOUTH DAILY WITH BREAKFAST     Endocrinology:  Diabetes - Sulfonylureas Passed - 05/14/2022  7:03 AM      Passed - HBA1C is between 0 and 7.9 and within 180 days    Hgb A1c MFr Bld  Date Value Ref Range Status  02/19/2022 7.3 (H) 4.8 - 5.6 % Final    Comment:             Prediabetes: 5.7 - 6.4          Diabetes: >6.4          Glycemic control for adults with diabetes: <7.0          Passed - Cr in normal range and within 360 days    Creat  Date Value Ref Range Status  11/21/2016 0.76 0.50 - 0.99 mg/dL Final    Comment:    For patients >68 years of age, the reference limit for Creatinine is approximately 13% higher for people identified as African-American. .    Creatinine, Ser  Date Value Ref Range Status  02/19/2022 0.84 0.57 - 1.00 mg/dL Final   Creatinine, POC  Date Value Ref Range Status  09/27/2015 NA mg/dL Final         Passed - Valid encounter within last 6 months    Recent Outpatient Visits           2 months ago Hypertension associated with diabetes [facility])   [place] Tally Joe T, FNP   8 months ago Hypertension associated with diabetes [facility])   [place] Tally Joe T, FNP   10 months ago Diarrhea due to malabsorption   [facility] Tally Joe T, FNP   11 months ago Type 2 diabetes mellitus with microalbuminuria, without long-term current use of insulin [facility])   [place] Tally Joe T, FNP   1 year ago Cognitive impairment   [facility] [name], [place]

## 2022-05-19 DIAGNOSIS — F4312 Post-traumatic stress disorder, chronic: Secondary | ICD-10-CM | POA: Diagnosis not present

## 2022-05-19 DIAGNOSIS — F39 Unspecified mood [affective] disorder: Secondary | ICD-10-CM | POA: Diagnosis not present

## 2022-05-19 DIAGNOSIS — F410 Panic disorder [episodic paroxysmal anxiety] without agoraphobia: Secondary | ICD-10-CM | POA: Diagnosis not present

## 2022-05-19 DIAGNOSIS — F900 Attention-deficit hyperactivity disorder, predominantly inattentive type: Secondary | ICD-10-CM | POA: Diagnosis not present

## 2022-05-20 DIAGNOSIS — I152 Hypertension secondary to endocrine disorders: Secondary | ICD-10-CM | POA: Diagnosis not present

## 2022-05-20 DIAGNOSIS — E1159 Type 2 diabetes mellitus with other circulatory complications: Secondary | ICD-10-CM | POA: Diagnosis not present

## 2022-05-20 DIAGNOSIS — F419 Anxiety disorder, unspecified: Secondary | ICD-10-CM | POA: Diagnosis not present

## 2022-05-20 DIAGNOSIS — M542 Cervicalgia: Secondary | ICD-10-CM | POA: Diagnosis not present

## 2022-05-20 DIAGNOSIS — R4189 Other symptoms and signs involving cognitive functions and awareness: Secondary | ICD-10-CM | POA: Diagnosis not present

## 2022-05-20 DIAGNOSIS — F319 Bipolar disorder, unspecified: Secondary | ICD-10-CM | POA: Diagnosis not present

## 2022-05-20 DIAGNOSIS — G8929 Other chronic pain: Secondary | ICD-10-CM | POA: Diagnosis not present

## 2022-05-20 DIAGNOSIS — M545 Low back pain, unspecified: Secondary | ICD-10-CM | POA: Diagnosis not present

## 2022-05-20 NOTE — Progress Notes (Signed)
I,J'ya E Hunter,acting as a scribe for Jacky Kindle, FNP.,have documented all relevant documentation on the behalf of Jacky Kindle, FNP,as directed by  Jacky Kindle, FNP while in the presence of Jacky Kindle, FNP.   Annual Wellness Visit    Patient: Melissa Arroyo, Female    DOB: [birth year], 72 y.o.   MRN: [id number] Visit Date: 05/21/2022  Today's Provider: Jacky Kindle, FNP  Introduced to nurse practitioner role and practice setting.  All questions answered.  Discussed provider/patient relationship and expectations.  Chief Complaint  Patient presents with   Annual Exam   Subjective    Melissa Arroyo is a 72 y.o. female who presents today for her Annual Wellness Visit. She reports consuming a general diet. Walks for exercise, 120 mins/week. She generally feels fairly well. She reports sleeping poorly. She does not have additional problems to discuss today.   Patient requesting glucose kit, a handicap sticker reports that her current one is expiring in July. Patient is inquiring about (Long Term Disability Paper) work.  Patient reports that she has moles on her back, requesting an examination or a referral to a dermatologist. She reports that there is an "un-drainable" cyst on her abdomen.   HPI  Medications: Outpatient Medications Prior to Visit  Medication Sig   Alcohol Swabs (ALCOHOL PREP) PADS Use 1 pad prior to checking blood sugar to clean skin   atorvastatin (LIPITOR) 40 MG tablet TAKE 1 TABLET(40 MG) BY MOUTH DAILY   b complex vitamins capsule Take 1 capsule by mouth daily.   blood glucose meter kit and supplies Dispense based on patient and insurance preference. Check glucose once daily. (FOR ICD-10 E11.29).Dispense based on patient and insurance preference. Check glucose once daily. (FOR ICD-10 E11.29).   chlorhexidine (PERIDEX) 0.12 % solution SMARTSIG:By Mouth   Cholecalciferol (VITAMIN D3) 10 MCG (400 UNIT) tablet Take by mouth daily. Dose unknown    cyclobenzaprine (FLEXERIL) 5 MG tablet Take 1 tablet (5 mg total) by mouth 3 (three) times daily as needed for muscle spasms (back pain, start with PM dosing).   DULoxetine (CYMBALTA) 60 MG capsule Take 60 mg by mouth daily.   estradiol (ESTRACE) 0.1 MG/GM vaginal cream Place 1 Applicatorful vaginally at bedtime.   FLUoxetine (PROZAC) 20 MG capsule Take 3 capsules (60 mg total) by mouth daily.   FLUoxetine (PROZAC) 40 MG capsule Take 40 mg by mouth daily.   fluticasone (FLONASE SENSIMIST) 27.5 MCG/SPRAY nasal spray Place 1-2 sprays into the nose daily.   glipiZIDE (GLUCOTROL XL) 10 MG 24 hr tablet TAKE 1 TABLET(10 MG) BY MOUTH DAILY WITH BREAKFAST   Glucosamine-Chondroitin (MOVE FREE PO) Take 1 tablet by mouth daily.   ibuprofen (ADVIL,MOTRIN) 200 MG tablet Take 400 mg by mouth at bedtime. As needed   LYCOPENE PO Take 300 mcg by mouth 1 day or 1 dose.   meloxicam (MOBIC) 7.5 MG tablet Take 2 tablets (15 mg total) by mouth daily. Avoid other NSAIDs, including Ibuprofen and Naproxen. Take with food/milk.   metFORMIN (GLUCOPHAGE-XR) 500 MG 24 hr tablet Take 1 tablet (500 mg total) by mouth 2 (two) times daily after a meal.   Multiple Vitamins-Minerals (CENTRUM SILVER PO) Take 1 tablet by mouth daily.   OLANZapine (ZYPREXA) 5 MG tablet Take 7.5 mg by mouth at bedtime.   pioglitazone (ACTOS) 30 MG tablet TAKE 1 TABLET(30 MG) BY MOUTH DAILY   propranolol (INDERAL) 10 MG tablet TAKE 1 TABLET(10 MG) BY MOUTH TWICE DAILY  zinc gluconate 50 MG tablet Take by mouth daily. Dose unknown   [DISCONTINUED] ACCU-CHEK GUIDE test strip USE TO TEST ONCE DAILY   [DISCONTINUED] lisinopril (ZESTRIL) 5 MG tablet TAKE 1 TO 2 TABLETS DAILY BASED ON BLOOD PRESSURE. GOAL IS< 130/80. PLEASE LET PROVIDER KNOW IF BLOOD PRESSURE IS CONSISENTLY ABOVE THIS GOAL.   No facility-administered medications prior to visit.    Allergies  Allergen Reactions   Vesicare [Solifenacin] Anaphylaxis    Throat Closed   Penicillins Rash     Patient Care Team: Jacky KindlePayne, Taryll Reichenberger T, FNP as PCP - General (Family Medicine) Lamar BlinksKowalski, Bruce J, MD as Consulting Physician (Cardiology) Atha StarksGualtieri, Camillo T, MD as Referring Physician (Psychiatry) Eli PhillipsKatsoudas, George, OD (Optometry) Juanell FairlyMcCray, Felecia, RN as Case Manager  Review of Systems  Constitutional:  Positive for fatigue. Negative for appetite change.    Last CBC Lab Results  Component Value Date   WBC 7.8 02/19/2022   HGB 12.1 02/19/2022   HCT 37.4 02/19/2022   MCV 96 02/19/2022   MCH 31.0 02/19/2022   RDW 12.8 02/19/2022   PLT 292 02/19/2022   Last metabolic panel Lab Results  Component Value Date   GLUCOSE 184 (H) 05/21/2022   NA 143 05/21/2022   K 5.1 05/21/2022   CL 101 05/21/2022   CO2 25 05/21/2022   BUN 17 05/21/2022   CREATININE 0.84 05/21/2022   EGFR 75 05/21/2022   CALCIUM 10.1 05/21/2022   PROT 7.3 05/21/2022   ALBUMIN 4.5 05/21/2022   LABGLOB 2.8 05/21/2022   AGRATIO 1.6 05/21/2022   BILITOT 0.3 05/21/2022   ALKPHOS 107 05/21/2022   AST 26 05/21/2022   ALT 34 (H) 05/21/2022   ANIONGAP 10 07/31/2018   Last lipids Lab Results  Component Value Date   CHOL 158 02/19/2022   HDL 68 02/19/2022   LDLCALC 72 02/19/2022   TRIG 100 02/19/2022   CHOLHDL 2.3 02/19/2022   Last hemoglobin A1c Lab Results  Component Value Date   HGBA1C 7.3 (H) 05/21/2022   Last thyroid functions Lab Results  Component Value Date   TSH 4.270 02/19/2021   Last vitamin D Lab Results  Component Value Date   VD25OH 37 11/21/2016   Last vitamin B12 and Folate Lab Results  Component Value Date   VITAMINB12 389 11/19/2020        Objective    Vitals: BP (!) 133/53 (BP Location: Left Arm, Patient Position: Sitting, Cuff Size: Large)   Pulse 78   Temp 98.1 F (36.7 C) (Oral)   Resp 14   Ht 5\' 5"  (1.651 m)   Wt 217 lb (98.4 kg)   SpO2 100%   BMI 36.11 kg/m  BP Readings from Last 3 Encounters:  05/21/22 (!) 133/53  02/19/22 128/61  09/04/21 (!) 113/52    Wt Readings from Last 3 Encounters:  05/21/22 217 lb (98.4 kg)  02/19/22 208 lb (94.3 kg)  09/04/21 208 lb (94.3 kg)   SpO2 Readings from Last 3 Encounters:  05/21/22 100%  02/19/22 97%  09/04/21 98%       Physical Exam Vitals and nursing note reviewed.  Constitutional:      General: She is awake. She is not in acute distress.    Appearance: Normal appearance. She is well-developed and well-groomed. She is obese. She is not ill-appearing, toxic-appearing or diaphoretic.  HENT:     Head: Normocephalic and atraumatic.     Jaw: There is normal jaw occlusion. No trismus, tenderness, swelling or pain on movement.  Right Ear: Hearing, tympanic membrane, ear canal and external ear normal. There is no impacted cerumen.     Left Ear: Hearing, tympanic membrane, ear canal and external ear normal. There is no impacted cerumen.     Nose: Nose normal. No congestion or rhinorrhea.     Right Turbinates: Not enlarged, swollen or pale.     Left Turbinates: Not enlarged, swollen or pale.     Right Sinus: No maxillary sinus tenderness or frontal sinus tenderness.     Left Sinus: No maxillary sinus tenderness or frontal sinus tenderness.     Mouth/Throat:     Lips: Pink.     Mouth: Mucous membranes are moist. No injury.     Tongue: No lesions.     Pharynx: Oropharynx is clear. Uvula midline. No pharyngeal swelling, oropharyngeal exudate, posterior oropharyngeal erythema or uvula swelling.     Tonsils: No tonsillar exudate or tonsillar abscesses.  Eyes:     General: Lids are normal. Lids are everted, no foreign bodies appreciated. Vision grossly intact. Gaze aligned appropriately. No allergic shiner or visual field deficit.       Right eye: No discharge.        Left eye: No discharge.     Extraocular Movements: Extraocular movements intact.     Conjunctiva/sclera: Conjunctivae normal.     Right eye: Right conjunctiva is not injected. No exudate.    Left eye: Left conjunctiva is not  injected. No exudate.    Pupils: Pupils are equal, round, and reactive to light.  Neck:     Thyroid: No thyroid mass, thyromegaly or thyroid tenderness.     Vascular: No carotid bruit.     Trachea: Trachea normal.  Cardiovascular:     Rate and Rhythm: Normal rate and regular rhythm.     Pulses: Normal pulses.          Carotid pulses are 2+ on the right side and 2+ on the left side.      Radial pulses are 2+ on the right side and 2+ on the left side.       Dorsalis pedis pulses are 2+ on the right side and 2+ on the left side.       Posterior tibial pulses are 2+ on the right side and 2+ on the left side.     Heart sounds: Normal heart sounds, S1 normal and S2 normal. No murmur heard.    No friction rub. No gallop.  Pulmonary:     Effort: Pulmonary effort is normal. No respiratory distress.     Breath sounds: Normal breath sounds and air entry. No stridor. No wheezing, rhonchi or rales.  Chest:     Chest wall: No tenderness.  Abdominal:     General: Abdomen is flat. Bowel sounds are normal. There is no distension.     Palpations: Abdomen is soft. There is no mass.     Tenderness: There is no abdominal tenderness. There is no right CVA tenderness, left CVA tenderness, guarding or rebound.     Hernia: No hernia is present.  Genitourinary:    Comments: Exam deferred; denies complaints Musculoskeletal:        General: No swelling, tenderness, deformity or signs of injury. Normal range of motion.     Cervical back: Full passive range of motion without pain, normal range of motion and neck supple. No edema, rigidity or tenderness. No muscular tenderness.     Right lower leg: No edema.     Left lower  leg: No edema.  Lymphadenopathy:     Cervical: No cervical adenopathy.     Right cervical: No superficial, deep or posterior cervical adenopathy.    Left cervical: No superficial, deep or posterior cervical adenopathy.  Skin:    General: Skin is warm and dry.     Capillary Refill: Capillary  refill takes less than 2 seconds.     Coloration: Skin is not jaundiced or pale.     Findings: No bruising, erythema, lesion or rash.     Comments: Recommend derm consult for AK with atypical nevi and small cyst; without irritation  Neurological:     General: No focal deficit present.     Mental Status: She is alert and oriented to person, place, and time. Mental status is at baseline.     GCS: GCS eye subscore is 4. GCS verbal subscore is 5. GCS motor subscore is 6.     Sensory: Sensation is intact. No sensory deficit.     Motor: Motor function is intact. No weakness.     Coordination: Coordination is intact. Coordination normal.     Gait: Gait is intact. Gait normal.     Comments: Recommend following return of paperwork from [employer]; re-certification can be completed  Psychiatric:        Attention and Perception: Attention and perception normal.        Mood and Affect: Mood and affect normal.        Speech: Speech normal.        Behavior: Behavior normal. Behavior is cooperative.        Thought Content: Thought content normal.        Cognition and Memory: Cognition and memory normal.        Judgment: Judgment normal.     Comments: [employer] paperwork to be completed by Felecia Jan and Caryn Section as primary given strong mental health component.     Most recent functional status assessment:    05/21/2022    9:26 AM  In your present state of health, do you have any difficulty performing the following activities:  Hearing? 0  Vision? 0  Difficulty concentrating or making decisions? 1  Walking or climbing stairs? 0  Dressing or bathing? 0  Doing errands, shopping? 0   Most recent fall risk assessment:    05/21/2022    9:25 AM  Fall Risk   Falls in the past year? 0  Number falls in past yr: 0  Injury with Fall? 0  Risk for fall due to : No Fall Risks    Most recent depression screenings:    05/21/2022    9:25 AM 02/19/2022    9:14 AM  PHQ 2/9 Scores  PHQ - 2 Score 2 2   PHQ- 9 Score 6 8   Most recent cognitive screening:    11/19/2020    1:44 PM  6CIT Screen  What Year? 0 points  What month? 0 points  What time? 0 points  Count back from 20 0 points  Months in reverse 0 points  Repeat phrase 0 points  Total Score 0 points   Most recent Audit-C alcohol use screening    05/21/2022    9:28 AM  Alcohol Use Disorder Test (AUDIT)  1. How often do you have a drink containing alcohol? 0  3. How often do you have six or more drinks on one occasion? 0   A score of 3 or more in women, and 4 or more in men  indicates increased risk for alcohol abuse, EXCEPT if all of the points are from question 1   Results for orders placed or performed in visit on 05/21/22  Comprehensive Metabolic Panel (CMET)  Result Value Ref Range   Glucose 184 (H) 70 - 99 mg/dL   BUN 17 8 - 27 mg/dL   Creatinine, Ser 1.61 0.57 - 1.00 mg/dL   eGFR 75 >09 UE/AVW/0.98   BUN/Creatinine Ratio 20 12 - 28   Sodium 143 134 - 144 mmol/L   Potassium 5.1 3.5 - 5.2 mmol/L   Chloride 101 96 - 106 mmol/L   CO2 25 20 - 29 mmol/L   Calcium 10.1 8.7 - 10.3 mg/dL   Total Protein 7.3 6.0 - 8.5 g/dL   Albumin 4.5 3.9 - 4.9 g/dL   Globulin, Total 2.8 1.5 - 4.5 g/dL   Albumin/Globulin Ratio 1.6 1.2 - 2.2   Bilirubin Total 0.3 0.0 - 1.2 mg/dL   Alkaline Phosphatase 107 44 - 121 IU/L   AST 26 0 - 40 IU/L   ALT 34 (H) 0 - 32 IU/L  Hemoglobin A1c  Result Value Ref Range   Hgb A1c MFr Bld 7.3 (H) 4.8 - 5.6 %   Est. average glucose Bld gHb Est-mCnc 163 mg/dL    Assessment & Plan     Annual wellness visit done today including the all of the following: Reviewed patient's Family Medical History Reviewed and updated list of patient's medical providers Assessment of cognitive impairment was done Assessed patient's functional ability Established a written schedule for health screening services Health Risk Assessent Completed and Reviewed  Exercise Activities and Dietary recommendations  Goals       DIET - INCREASE WATER INTAKE     Recommend to drink at least 6-8 8oz glasses of water per day.     Manage My Emotions     Timeframe:  Long-Range Goal Priority:  Medium Start Date:    07/09/21                       Expected End Date:  07/16/21                   Follow Up Date 07/16/21    - continue active participation in personal counseling - talk about feelings with a friend, family or spiritual advisor - practice positive thinking and self-talk    Why is this important?   When you are stressed, down or upset, your body reacts too.  For example, your blood pressure may get higher; you may have a headache or stomachache.  When your emotions get the best of you, your body's ability to fight off cold and flu gets weak.  These steps will help you manage your emotions.     Notes:         Immunization History  Administered Date(s) Administered   Fluad Quad(high Dose 65+) 12/28/2018, 10/20/2019, 11/19/2020   Influenza, High Dose Seasonal PF 10/19/2017   Influenza,inj,Quad PF,6+ Mos 12/03/2016   Influenza-Unspecified 12/01/2015   Moderna Covid-19 Vaccine Bivalent Booster 59yrs & up 12/01/2020   PFIZER Comirnaty(Gray Top)Covid-19 Tri-Sucrose Vaccine 04/25/2019, 05/16/2019   Pneumococcal Conjugate-13 12/01/2015   Pneumococcal Polysaccharide-23 03/30/2017   Tdap 03/30/2017   Zoster Recombinat (Shingrix) 02/28/2021, 06/09/2021    Health Maintenance  Topic Date Due   COVID-19 Vaccine (4 - 2023-24 season) 10/11/2021   FOOT EXAM  05/01/2022   OPHTHALMOLOGY EXAM  06/04/2022   INFLUENZA VACCINE  09/11/2022  HEMOGLOBIN A1C  11/20/2022   Diabetic kidney evaluation - Urine ACR  02/20/2023   Diabetic kidney evaluation - eGFR measurement  05/21/2023   Medicare Annual Wellness (AWV)  05/21/2023   MAMMOGRAM  09/28/2023   COLONOSCOPY (Pts 45-289yrs Insurance coverage will need to be confirmed)  11/27/2025   DTaP/Tdap/Td (2 - Td or Tdap) 03/31/2027   Pneumonia Vaccine 5865+ Years old   Completed   DEXA SCAN  Completed   Hepatitis C Screening  Completed   Zoster Vaccines- Shingrix  Completed   HPV VACCINES  Aged Out   Fecal DNA (Cologuard)  Discontinued     Discussed health benefits of physical activity, and encouraged her to engage in regular exercise appropriate for her age and condition.    Problem List Items Addressed This Visit       Cardiovascular and Mediastinum   Hypertension associated with diabetes    Chronic, borderline Goal of <130/<80 Slightly low DBP on frequent checks; defer further medication changes at this time Previously on lisinopril 10 mg to assist; continue for kidney protection      Relevant Medications   lisinopril (ZESTRIL) 10 MG tablet   Other Relevant Orders   Comprehensive Metabolic Panel (CMET) (Completed)   Hemoglobin A1c (Completed)     Endocrine   Type 2 diabetes mellitus with microalbuminuria, without long-term current use of insulin    Chronic, previously variable with strong association between HTN, HLD and mood disorder/chronic, recurrent, severe depression Repeat A1c Discussed use of metformin vs secondary agents to assist in meeting A1c goal of <7%. Could add second tablet of glipizide to assist. Continue to recommend balanced, lower carb meals. Smaller meal size, adding snacks. Choosing water as drink of choice and increasing purposeful exercise. Patient continues to wish to defer start of an injectable medication d/t cost despite proven benefit for multi systems       Relevant Medications   lisinopril (ZESTRIL) 10 MG tablet     Other   Depression, recurrent    Chronic, severe, depression with psychological components including cyclic thought processes and overgeneralization and inability to work on interpersonal relationships including with partner, and coworkers Denies SI or HI Followed by Felecia Janina Thompson and Caryn SectionAarti Kapur Request for further LDT paperwork completion at this time to reflect connect between physical  and mental wellbeing      Medicare annual wellness visit, subsequent - Primary    Things to do to keep yourself healthy  - Exercise at least 30-45 minutes a day, 3-4 days a week.  - Eat a low-fat diet with lots of fruits and vegetables, up to 7-9 servings per day.  - Seatbelts can save your life. Wear them always.  - Smoke detectors on every level of your home, check batteries every year.  - Eye Doctor - have an eye exam every 1-2 years  - Safe sex - if you may be exposed to STDs, use a condom.  - Alcohol -  If you drink, do it moderately, less than 2 drinks per day.  - Health Care Power of Attorney. Choose someone to speak for you if you are not able.  - Depression is common in our stressful world.If you're feeling down or losing interest in things you normally enjoy, please come in for a visit.  - Violence - If anyone is threatening or hurting you, please call immediately.       Return in about 3 months (around 08/20/2022) for chonic disease management.    Clovis FredricksonI, Cornelius Schuitema T  Suzie Portela, FNP, have reviewed all documentation for this visit. The documentation on 05/25/22 for the exam, diagnosis, procedures, and orders are all accurate and complete.  Jacky Kindle, FNP  [facility] [phone] (phone) [phone] (fax)  [facility]

## 2022-05-21 ENCOUNTER — Telehealth: Payer: Self-pay

## 2022-05-21 ENCOUNTER — Ambulatory Visit (INDEPENDENT_AMBULATORY_CARE_PROVIDER_SITE_OTHER): Payer: Medicare Other | Admitting: Family Medicine

## 2022-05-21 VITALS — BP 133/53 | HR 78 | Temp 98.1°F | Resp 14 | Ht 65.0 in | Wt 217.0 lb

## 2022-05-21 DIAGNOSIS — R809 Proteinuria, unspecified: Secondary | ICD-10-CM | POA: Diagnosis not present

## 2022-05-21 DIAGNOSIS — E1159 Type 2 diabetes mellitus with other circulatory complications: Secondary | ICD-10-CM

## 2022-05-21 DIAGNOSIS — Z0000 Encounter for general adult medical examination without abnormal findings: Secondary | ICD-10-CM | POA: Diagnosis not present

## 2022-05-21 DIAGNOSIS — F339 Major depressive disorder, recurrent, unspecified: Secondary | ICD-10-CM

## 2022-05-21 DIAGNOSIS — I152 Hypertension secondary to endocrine disorders: Secondary | ICD-10-CM | POA: Diagnosis not present

## 2022-05-21 DIAGNOSIS — E1129 Type 2 diabetes mellitus with other diabetic kidney complication: Secondary | ICD-10-CM | POA: Diagnosis not present

## 2022-05-21 DIAGNOSIS — Z7984 Long term (current) use of oral hypoglycemic drugs: Secondary | ICD-10-CM | POA: Diagnosis not present

## 2022-05-21 MED ORDER — LANCETS MISC. MISC: 1.0000 | Freq: Every day | 0 refills | Status: AC

## 2022-05-21 MED ORDER — BLOOD GLUCOSE MONITORING SUPPL DEVI: 1.0000 | Freq: Every day | 0 refills | Status: DC

## 2022-05-21 MED ORDER — BLOOD GLUCOSE TEST VI STRP: 1.0000 | ORAL_STRIP | Freq: Every day | 0 refills | Status: AC

## 2022-05-21 MED ORDER — LANCET DEVICE MISC: 1.0000 | Freq: Every day | 0 refills | Status: AC

## 2022-05-21 MED ORDER — LISINOPRIL 10 MG PO TABS
10.0000 mg | ORAL_TABLET | Freq: Every day | ORAL | 3 refills | Status: DC
Start: 2022-05-21 — End: 2022-07-09

## 2022-05-21 NOTE — Telephone Encounter (Unsigned)
Copied from CRM [id number]. Topic: General - Other >> May 21, 2022 12:30 PM Ja-Kwan M wrote: Reason for CRM: Pt reports that her insurance will not cover Ozempic. Pt also stated that the long term disability information needs to be sent to Shiela Mayer. Pt did not have contact information for Shiela Mayer she stated Robynn Pane already has it

## 2022-05-22 DIAGNOSIS — F4312 Post-traumatic stress disorder, chronic: Secondary | ICD-10-CM | POA: Diagnosis not present

## 2022-05-22 DIAGNOSIS — F900 Attention-deficit hyperactivity disorder, predominantly inattentive type: Secondary | ICD-10-CM | POA: Diagnosis not present

## 2022-05-22 DIAGNOSIS — F332 Major depressive disorder, recurrent severe without psychotic features: Secondary | ICD-10-CM | POA: Diagnosis not present

## 2022-05-22 LAB — COMPREHENSIVE METABOLIC PANEL
ALT: 34 IU/L — ABNORMAL HIGH (ref 0–32)
AST: 26 IU/L (ref 0–40)
Albumin/Globulin Ratio: 1.6 (ref 1.2–2.2)
Albumin: 4.5 g/dL (ref 3.9–4.9)
Alkaline Phosphatase: 107 IU/L (ref 44–121)
BUN/Creatinine Ratio: 20 (ref 12–28)
BUN: 17 mg/dL (ref 8–27)
Bilirubin Total: 0.3 mg/dL (ref 0.0–1.2)
CO2: 25 mmol/L (ref 20–29)
Calcium: 10.1 mg/dL (ref 8.7–10.3)
Chloride: 101 mmol/L (ref 96–106)
Creatinine, Ser: 0.84 mg/dL (ref 0.57–1.00)
Globulin, Total: 2.8 g/dL (ref 1.5–4.5)
Glucose: 184 mg/dL — ABNORMAL HIGH (ref 70–99)
Potassium: 5.1 mmol/L (ref 3.5–5.2)
Sodium: 143 mmol/L (ref 134–144)
Total Protein: 7.3 g/dL (ref 6.0–8.5)
eGFR: 75 mL/min/{1.73_m2} (ref >59)

## 2022-05-22 LAB — HEMOGLOBIN A1C
Est. average glucose Bld gHb Est-mCnc: 163 mg/dL
Hgb A1c MFr Bld: 7.3 % — ABNORMAL HIGH (ref 4.8–5.6)

## 2022-05-22 NOTE — Progress Notes (Signed)
Labs are improving in ALT and stable A1c. Continue to recommend balanced, lower carb meals. Smaller meal size, adding snacks. Choosing water as drink of choice and increasing purposeful exercise.

## 2022-05-25 ENCOUNTER — Encounter: Payer: Self-pay | Admitting: Family Medicine

## 2022-05-25 ENCOUNTER — Other Ambulatory Visit: Payer: Self-pay | Admitting: Family Medicine

## 2022-05-25 NOTE — Assessment & Plan Note (Signed)
Chronic, borderline Goal of <130/<80 Slightly low DBP on frequent checks; defer further medication changes at this time Previously on lisinopril 10 mg to assist; continue for kidney protection

## 2022-05-25 NOTE — Assessment & Plan Note (Signed)

## 2022-05-25 NOTE — Assessment & Plan Note (Signed)
Chronic, previously variable with strong association between HTN, HLD and mood disorder/chronic, recurrent, severe depression Repeat A1c Discussed use of metformin vs secondary agents to assist in meeting A1c goal of <7%. Could add second tablet of glipizide to assist. Continue to recommend balanced, lower carb meals. Smaller meal size, adding snacks. Choosing water as drink of choice and increasing purposeful exercise. Patient continues to wish to defer start of an injectable medication d/t cost despite proven benefit for multi systems

## 2022-05-25 NOTE — Assessment & Plan Note (Signed)
Chronic, severe, depression with psychological components including cyclic thought processes and overgeneralization and inability to work on interpersonal relationships including with partner, and coworkers Denies SI or HI Followed by Felecia Jan and Caryn Section Request for further LDT paperwork completion at this time to reflect connect between physical and mental wellbeing

## 2022-05-26 ENCOUNTER — Telehealth: Payer: Self-pay | Admitting: Family Medicine

## 2022-05-26 ENCOUNTER — Other Ambulatory Visit: Payer: Self-pay | Admitting: Family Medicine

## 2022-05-26 DIAGNOSIS — E1129 Type 2 diabetes mellitus with other diabetic kidney complication: Secondary | ICD-10-CM

## 2022-05-26 NOTE — Telephone Encounter (Signed)
  Called patient & informed her that the Disability paperwork was completed and faxed on & a copy would be left at the front office for pick up. Patient declined to pick up.

## 2022-05-27 ENCOUNTER — Other Ambulatory Visit: Payer: Self-pay | Admitting: Family Medicine

## 2022-05-27 DIAGNOSIS — E1129 Type 2 diabetes mellitus with other diabetic kidney complication: Secondary | ICD-10-CM

## 2022-05-27 NOTE — Telephone Encounter (Signed)
Medication Refill - Medication: metFORMIN (GLUCOPHAGE) 1000 MG tablet   Has the patient contacted their pharmacy? Yes.   Pharmacy advised the script was not set up to be refilled and she should contact her provider  Preferred Pharmacy (with phone number or street name):  [id number] [place]ree D[place], [place] - [address] AT [facility] OF [address] [facility] Phone: [phone]  Fax: [phone]     Has the patient been seen for an appointment in the last year OR does the patient have an upcoming appointment? Yes.    Agent: Please be advised that RX refills may take up to 3 business days. We ask that you follow-up with your pharmacy.  Patient said she has no more medicine and has been out for 3 days now.Please advise why she can't get her refill

## 2022-05-27 NOTE — Telephone Encounter (Signed)
Requested Prescriptions  Refused Prescriptions Disp Refills   metFORMIN (GLUCOPHAGE) 1000 MG tablet [Pharmacy Med Name: METFORMIN  TABLETS] 180 tablet 3    Sig: TAKE 1 TABLET(1000 MG) BY MOUTH TWICE DAILY WITH A MEAL     Endocrinology:  Diabetes - Biguanides Failed - 05/26/2022  8:46 AM      Failed - CBC within normal limits and completed in the last 12 months    WBC  Date Value Ref Range Status  02/19/2022 7.8 3.4 - 10.8 x10E3/uL Final  07/31/2018 8.9 4.0 - 10.5 K/uL Final   RBC  Date Value Ref Range Status  02/19/2022 3.90 3.77 - 5.28 x10E6/uL Final  07/31/2018 4.25 3.87 - 5.11 MIL/uL Final   Hemoglobin  Date Value Ref Range Status  02/19/2022 12.1 11.1 - 15.9 g/dL Final   Hematocrit  Date Value Ref Range Status  02/19/2022 37.4 34.0 - 46.6 % Final   MCHC  Date Value Ref Range Status  02/19/2022 32.4 31.5 - 35.7 g/dL Final  16/11/9602 54.0 30.0 - 36.0 g/dL Final   [facility]  Date Value Ref Range Status  02/19/2022 31.0 26.6 - 33.0 pg Final  07/31/2018 31.1 26.0 - 34.0 pg Final   MCV  Date Value Ref Range Status  02/19/2022 96 79 - 97 fL Final   No results found for: "PLTCOUNTKUC", "LABPLAT", "POCPLA" RDW  Date Value Ref Range Status  02/19/2022 12.8 11.7 - 15.4 % Final         Passed - Cr in normal range and within 360 days    Creat  Date Value Ref Range Status  11/21/2016 0.76 0.50 - 0.99 mg/dL Final    Comment:    For patients >33 years of age, the reference limit for Creatinine is approximately 13% higher for people identified as African-American. .    Creatinine, Ser  Date Value Ref Range Status  05/21/2022 0.84 0.57 - 1.00 mg/dL Final   Creatinine, POC  Date Value Ref Range Status  09/27/2015 NA mg/dL Final         Passed - HBA1C is between 0 and 7.9 and within 180 days    Hgb A1c MFr Bld  Date Value Ref Range Status  05/21/2022 7.3 (H) 4.8 - 5.6 % Final    Comment:             Prediabetes: 5.7 - 6.4          Diabetes: >6.4           Glycemic control for adults with diabetes: <7.0          Passed - eGFR in normal range and within 360 days    GFR, Est African American  Date Value Ref Range Status  11/21/2016 96 > OR = 60 mL/min/1.78m2 Final   GFR calc Af Amer  Date Value Ref Range Status  04/18/2019 86 >59 mL/min/1.73 Final   GFR, Est Non African American  Date Value Ref Range Status  11/21/2016 83 > OR = 60 mL/min/1.38m2 Final   GFR calc non Af Amer  Date Value Ref Range Status  04/18/2019 74 >59 mL/min/1.73 Final   eGFR  Date Value Ref Range Status  05/21/2022 75 >59 mL/min/1.73 Final         Passed - B12 Level in normal range and within 720 days    Vitamin B-12  Date Value Ref Range Status  11/19/2020 389 232 - 1,245 pg/mL Final         Passed - Valid encounter  within last 6 months    Recent Outpatient Visits           3 months ago Hypertension associated with diabetes [facility])   [place] [facility] Merita Norton T, FNP   8 months ago Hypertension associated with diabetes [facility])   [facility] Merita Norton T, FNP   10 months ago Diarrhea due to malabsorption   [facility] Merita Norton T, FNP   11 months ago Type 2 diabetes mellitus with microalbuminuria, without long-term current use of insulin [facility])   [facility] Jacky Kindle, FNP   1 year ago Cognitive impairment   [facility] Jacky Kindle, FNP

## 2022-05-27 NOTE — Telephone Encounter (Signed)
[facility] called and spoke to [place], [facility] about the refill(s) metformin requested. Advised it was sent on 09/04/21 #180/3 refill(s). She says it looks like it was closed out at the patient request, the last time it was refilled for the patient was in July, 2023. She says most times when it's closed out is when the patient requests because they are no longer taking. She says she doesn't know if the patient was getting it refilled somewhere else. Will route to the provider as the patient is requesting 1000 mg and only 500 mg on profile.

## 2022-05-29 ENCOUNTER — Other Ambulatory Visit: Payer: Self-pay | Admitting: Family Medicine

## 2022-05-29 DIAGNOSIS — E1129 Type 2 diabetes mellitus with other diabetic kidney complication: Secondary | ICD-10-CM

## 2022-05-29 MED ORDER — METFORMIN HCL ER 500 MG PO TB24
1000.0000 mg | ORAL_TABLET | Freq: Two times a day (BID) | ORAL | 3 refills | Status: AC
Start: 2022-05-29 — End: ?

## 2022-05-29 NOTE — Telephone Encounter (Addendum)
Pt is calling back re Metformin. Pt was frustrated as waiting to here back from [place] as she was not in office. She has been out of the Metformin for a week. She states that her GI issues were resolved and that she feels she will be ok with the 1000 mg. Please call her if you have any questions, otherwise please send in the script as been out. [phone].

## 2022-06-02 DIAGNOSIS — M79602 Pain in left arm: Secondary | ICD-10-CM | POA: Diagnosis not present

## 2022-06-02 DIAGNOSIS — M79601 Pain in right arm: Secondary | ICD-10-CM | POA: Diagnosis not present

## 2022-06-05 DIAGNOSIS — F332 Major depressive disorder, recurrent severe without psychotic features: Secondary | ICD-10-CM | POA: Diagnosis not present

## 2022-06-05 DIAGNOSIS — F900 Attention-deficit hyperactivity disorder, predominantly inattentive type: Secondary | ICD-10-CM | POA: Diagnosis not present

## 2022-06-05 DIAGNOSIS — F4312 Post-traumatic stress disorder, chronic: Secondary | ICD-10-CM | POA: Diagnosis not present

## 2022-06-06 ENCOUNTER — Other Ambulatory Visit: Payer: Self-pay | Admitting: Family Medicine

## 2022-06-06 DIAGNOSIS — E1129 Type 2 diabetes mellitus with other diabetic kidney complication: Secondary | ICD-10-CM

## 2022-06-13 MED ORDER — BLOOD GLUCOSE METER KIT: PACK | 0 refills | Status: DC

## 2022-06-15 DIAGNOSIS — Z1211 Encounter for screening for malignant neoplasm of colon: Secondary | ICD-10-CM | POA: Diagnosis not present

## 2022-06-18 DIAGNOSIS — F4312 Post-traumatic stress disorder, chronic: Secondary | ICD-10-CM | POA: Diagnosis not present

## 2022-06-18 DIAGNOSIS — F332 Major depressive disorder, recurrent severe without psychotic features: Secondary | ICD-10-CM | POA: Diagnosis not present

## 2022-06-18 DIAGNOSIS — F900 Attention-deficit hyperactivity disorder, predominantly inattentive type: Secondary | ICD-10-CM | POA: Diagnosis not present

## 2022-06-19 DIAGNOSIS — F39 Unspecified mood [affective] disorder: Secondary | ICD-10-CM | POA: Diagnosis not present

## 2022-06-19 DIAGNOSIS — F4312 Post-traumatic stress disorder, chronic: Secondary | ICD-10-CM | POA: Diagnosis not present

## 2022-06-19 DIAGNOSIS — F900 Attention-deficit hyperactivity disorder, predominantly inattentive type: Secondary | ICD-10-CM | POA: Diagnosis not present

## 2022-06-19 DIAGNOSIS — F410 Panic disorder [episodic paroxysmal anxiety] without agoraphobia: Secondary | ICD-10-CM | POA: Diagnosis not present

## 2022-06-30 ENCOUNTER — Other Ambulatory Visit: Payer: Self-pay | Admitting: Family Medicine

## 2022-06-30 DIAGNOSIS — I10 Essential (primary) hypertension: Secondary | ICD-10-CM

## 2022-06-30 LAB — COLOGUARD: COLOGUARD: NEGATIVE

## 2022-06-30 NOTE — Telephone Encounter (Signed)
Pt stated that she is completely out of her medication.  Medication Refill - Medication: propranolol (INDERAL) 10 MG tablet  Has the patient contacted their pharmacy? Yes.   Pt told to contact provider  Preferred Pharmacy (with phone number or street name): [id number] - [place], [place] - [address] AT [facility] [address] [facility] Phone: [phone]  Fax: [phone]   Has the patient been seen for an appointment in the last year OR does the patient have an upcoming appointment? Yes.    Agent: Please be advised that RX refills may take up to 3 business days. We ask that you follow-up with your pharmacy.

## 2022-07-01 ENCOUNTER — Encounter: Payer: Self-pay | Admitting: Physician Assistant

## 2022-07-01 ENCOUNTER — Ambulatory Visit (INDEPENDENT_AMBULATORY_CARE_PROVIDER_SITE_OTHER): Payer: Medicare Other | Admitting: Physician Assistant

## 2022-07-01 VITALS — BP 117/52 | HR 84 | Ht 65.0 in | Wt 210.0 lb

## 2022-07-01 DIAGNOSIS — R809 Proteinuria, unspecified: Secondary | ICD-10-CM | POA: Diagnosis not present

## 2022-07-01 DIAGNOSIS — E1129 Type 2 diabetes mellitus with other diabetic kidney complication: Secondary | ICD-10-CM

## 2022-07-01 LAB — POCT GLUCOSE (DEVICE FOR HOME USE): POC Glucose: 117 mg/dl — AB (ref 70–99)

## 2022-07-01 MED ORDER — PROPRANOLOL HCL 10 MG PO TABS
ORAL_TABLET | ORAL | 0 refills | Status: DC
Start: 2022-07-01 — End: 2022-07-09

## 2022-07-01 NOTE — Progress Notes (Signed)
Negative cologuard; repeat in 3 years if desired.

## 2022-07-01 NOTE — Telephone Encounter (Signed)
Requested Prescriptions  Pending Prescriptions Disp Refills   propranolol (INDERAL) 10 MG tablet 180 tablet 0    Sig: TAKE 1 TABLET(10 MG) BY MOUTH TWICE DAILY     Cardiovascular:  Beta Blockers Passed - 06/30/2022  1:50 PM      Passed - Last BP in normal range    BP Readings from Last 1 Encounters:  05/21/22 (!) 133/53         Passed - Last Heart Rate in normal range    Pulse Readings from Last 1 Encounters:  05/21/22 78         Passed - Valid encounter within last 6 months    Recent Outpatient Visits           4 months ago Hypertension associated with diabetes [facility])   [place] Merita Norton T, FNP   10 months ago Hypertension associated with diabetes [facility])   [facility] Merita Norton T, FNP   12 months ago Diarrhea due to malabsorption   [facility] Merita Norton T, FNP   1 year ago Type 2 diabetes mellitus with microalbuminuria, without long-term current use of insulin [facility])   [place] Merita Norton T, FNP   1 year ago Cognitive impairment   [facility] Jacky Kindle, [place]

## 2022-07-01 NOTE — Progress Notes (Signed)
Established patient visit  Patient: Melissa Arroyo   DOB: 27-Mar-1950   72 y.o. Female  MRN: [id number] Visit Date: 07/01/2022  Today's healthcare provider: Debera Lat, PA-C   Chief Complaint  Patient presents with   Medical Management of Chronic Issues    DM FU   Subjective    HPI   Comments   Glucose reading:Lowest-156, highest 210-----dizziness, shaking, concentrating, heart palpitation-1 month. Pt is on mediterranean diet.        Diabetes Mellitus Type II, Follow-up  Lab Results  Component Value Date   HGBA1C 7.3 (H) 05/21/2022   HGBA1C 7.3 (H) 02/19/2022   HGBA1C 6.6 (H) 09/04/2021   Wt Readings from Last 3 Encounters:  07/01/22 210 lb (95.3 kg)  05/21/22 217 lb (98.4 kg)  02/19/22 208 lb (94.3 kg)   Last seen for diabetes 4 months ago.  Management since then includes glipizide 10 metformin 500 Bid actos 30. She reports good compliance with treatment. She is not having side effects.  Symptoms: No fatigue No foot ulcerations  No appetite changes No nausea  No paresthesia of the feet  No polydipsia  No polyuria No visual disturbances   No vomiting     Home blood sugar records: Lowest-156, highest 210  Episodes of hypoglycemia? Yes see above  Sunday night and yesterday Current insulin regiment: none Most Recent Eye Exam: June 6 of 2024 Current exercise: walking Current diet habits: low carb diet:  8 - breakfast, 11:30 orange, salad at lunch, dinner - around 5 pm  Pertinent Labs: Lab Results  Component Value Date   CHOL 158 02/19/2022   HDL 68 02/19/2022   LDLCALC 72 02/19/2022   TRIG 100 02/19/2022   CHOLHDL 2.3 02/19/2022   Lab Results  Component Value Date   NA 143 05/21/2022   K 5.1 05/21/2022   CREATININE 0.84 05/21/2022   EGFR 75 05/21/2022   LABMICR <3.0 02/19/2022   MICRALBCREAT <3 02/19/2022     ---------------------------------------------------------------------------------------------------      07/01/2022    1:56 PM  05/21/2022    9:25 AM 02/19/2022    9:14 AM  Depression screen PHQ 2/9  Decreased Interest 1 1 1  Down, Depressed, Hopeless 1 1 1  PHQ - 2 Score 2 2 2  Altered sleeping 2 1 2  Tired, decreased energy 1 2 1  Change in appetite 0 0 1  Feeling bad or failure about yourself  1 1 1  Trouble concentrating 1 0 0  Moving slowly or fidgety/restless 0 0 1  Suicidal thoughts 0 0 0  PHQ-9 Score 7 6 8  Difficult doing work/chores Not difficult at all Somewhat difficult Somewhat difficult      10 /24/2022    8:51 AM 11/19/2020    1:43 PM  GAD 7 : Generalized Anxiety Score  Nervous, Anxious, on Edge 1 3  Control/stop worrying 1 3  Worry too much - different things 1 3  Trouble relaxing 1 1  Restless 0 0  Easily annoyed or irritable 1 1  Afraid - awful might happen 0 3  Total GAD 7 Score 5 14  Anxiety Difficulty Somewhat difficult Extremely difficult    Medications: Outpatient Medications Prior to Visit  Medication Sig   Alcohol Swabs (ALCOHOL PREP) PADS Use 1 pad prior to checking blood sugar to clean skin   atorvastatin (LIPITOR) 40 MG tablet TAKE 1 TABLET(40 MG) BY MOUTH DAILY   b complex vitamins capsule Take 1 capsule by mouth daily.   blood  glucose meter kit and supplies Dispense based on patient and insurance preference. Check glucose once daily. (FOR ICD-10 E11.29).Dispense based on patient and insurance preference. Check glucose once daily. (FOR ICD-10 E11.29).   Blood Glucose Monitoring Suppl DEVI 1 each by Does not apply route daily. May substitute to any manufacturer covered by patient's insurance.   chlorhexidine (PERIDEX) 0.12 % solution SMARTSIG:By Mouth   Cholecalciferol (VITAMIN D3) 10 MCG (400 UNIT) tablet Take by mouth daily. Dose unknown   cyclobenzaprine (FLEXERIL) 5 MG tablet Take 1 tablet (5 mg total) by mouth 3 (three) times daily as needed for muscle spasms (back pain, start with PM dosing).   DULoxetine (CYMBALTA) 60 MG capsule Take 60 mg by mouth daily.    estradiol (ESTRACE) 0.1 MG/GM vaginal cream Place 1 Applicatorful vaginally at bedtime.   FLUoxetine (PROZAC) 20 MG capsule Take 3 capsules (60 mg total) by mouth daily.   FLUoxetine (PROZAC) 40 MG capsule Take 40 mg by mouth daily.   fluticasone (FLONASE SENSIMIST) 27.5 MCG/SPRAY nasal spray Place 1-2 sprays into the nose daily.   glipiZIDE (GLUCOTROL XL) 10 MG 24 hr tablet TAKE 1 TABLET(10 MG) BY MOUTH DAILY WITH BREAKFAST   Glucosamine-Chondroitin (MOVE FREE PO) Take 1 tablet by mouth daily.   Glucose Blood (BLOOD GLUCOSE TEST STRIPS) STRP 1 each by In Vitro route daily. May substitute to any manufacturer covered by patient's insurance.   ibuprofen (ADVIL,MOTRIN) 200 MG tablet Take 400 mg by mouth at bedtime. As needed   lisinopril (ZESTRIL) 10 MG tablet Take 1 tablet (10 mg total) by mouth daily.   LYCOPENE PO Take 300 mcg by mouth 1 day or 1 dose.   meloxicam (MOBIC) 7.5 MG tablet Take 2 tablets (15 mg total) by mouth daily. Avoid other NSAIDs, including Ibuprofen and Naproxen. Take with food/milk.   metFORMIN (GLUCOPHAGE-XR) 500 MG 24 hr tablet Take 2 tablets (1,000 mg total) by mouth 2 (two) times daily with a meal.   Multiple Vitamins-Minerals (CENTRUM SILVER PO) Take 1 tablet by mouth daily.   OLANZapine (ZYPREXA) 5 MG tablet Take 7.5 mg by mouth at bedtime.   pioglitazone (ACTOS) 30 MG tablet TAKE 1 TABLET(30 MG) BY MOUTH DAILY   propranolol (INDERAL) 10 MG tablet TAKE 1 TABLET(10 MG) BY MOUTH TWICE DAILY   zinc gluconate 50 MG tablet Take by mouth daily. Dose unknown   No facility-administered medications prior to visit.    Review of Systems  All other systems reviewed and are negative.  Except see HPI      Objective    BP (!) 117/52 (BP Location: Right Arm, Patient Position: Sitting, Cuff Size: Normal)   Pulse 84   Ht 5\' 5"  (1.651 m)   Wt 210 lb (95.3 kg)   SpO2 99%   BMI 34.95 kg/m    Physical Exam Vitals reviewed.  Constitutional:      General: She is not in  acute distress.    Appearance: Normal appearance. She is well-developed. She is not diaphoretic.  HENT:     Head: Normocephalic and atraumatic.  Eyes:     General: No scleral icterus.    Conjunctiva/sclera: Conjunctivae normal.  Neck:     Thyroid: No thyromegaly.  Cardiovascular:     Rate and Rhythm: Normal rate and regular rhythm.     Pulses: Normal pulses.     Heart sounds: Normal heart sounds. No murmur heard. Pulmonary:     Effort: Pulmonary effort is normal. No respiratory distress.  Breath sounds: Normal breath sounds. No wheezing, rhonchi or rales.  Musculoskeletal:     Cervical back: Neck supple.     Right lower leg: No edema.     Left lower leg: No edema.  Lymphadenopathy:     Cervical: No cervical adenopathy.  Skin:    General: Skin is warm and dry.     Findings: No rash.  Neurological:     Mental Status: She is alert and oriented to person, place, and time. Mental status is at baseline.  Psychiatric:        Mood and Affect: Mood normal.        Behavior: Behavior normal.      Results for orders placed or performed in visit on 07/01/22  POCT Glucose (Device for Home Use)  Result Value Ref Range   Glucose Fasting, POC     POC Glucose 117 (A) 70 - 99 mg/dl    Assessment & Plan    1. Type 2 diabetes mellitus with microalbuminuria, without long-term current use of insulin (HCC) Chronic and unstable with a few episodes of hypoglycemia - POCT Glucose (Device for Home Use) 117 Continue with her current medications but Decrease glipizide to 1/2 tab and increase metformin to 2000 mg daily. We discussed with pt these changes in her treatment plan and pt agreed. Continue her current dietary restrictions and daily exercise Pt lost 7 pounds since her last visit with [place]. Continue strictly monitor her BS at home. Pt was provided with libre sample. If she wants to continue with Josephine Igo, an order will be placed for a sensor. Advised to scheduled her FU with her PCP in 2 weeks  or in a mo    The patient was advised to call back or seek an in-person evaluation if the symptoms worsen or if the condition fails to improve as anticipated.  I discussed the assessment and treatment plan with the patient. The patient was provided an opportunity to ask questions and all were answered. The patient agreed with the plan and demonstrated an understanding of the instructions.  I, Debera Lat, PA-C have reviewed all documentation for this visit. The documentation on  07/01/22 for the exam, diagnosis, procedures, and orders are all accurate and complete.  Debera Lat, [facility], MMS [facility] [phone] (phone) [phone] (fax)  [facility]

## 2022-07-02 DIAGNOSIS — F332 Major depressive disorder, recurrent severe without psychotic features: Secondary | ICD-10-CM | POA: Diagnosis not present

## 2022-07-02 DIAGNOSIS — F900 Attention-deficit hyperactivity disorder, predominantly inattentive type: Secondary | ICD-10-CM | POA: Diagnosis not present

## 2022-07-02 DIAGNOSIS — F4312 Post-traumatic stress disorder, chronic: Secondary | ICD-10-CM | POA: Diagnosis not present

## 2022-07-08 ENCOUNTER — Encounter: Payer: Self-pay | Admitting: Physician Assistant

## 2022-07-09 ENCOUNTER — Ambulatory Visit (INDEPENDENT_AMBULATORY_CARE_PROVIDER_SITE_OTHER): Payer: Medicare Other | Admitting: Family Medicine

## 2022-07-09 ENCOUNTER — Other Ambulatory Visit: Payer: Self-pay | Admitting: Family Medicine

## 2022-07-09 ENCOUNTER — Encounter: Payer: Self-pay | Admitting: Family Medicine

## 2022-07-09 ENCOUNTER — Other Ambulatory Visit: Payer: Self-pay | Admitting: Physician Assistant

## 2022-07-09 VITALS — BP 92/42 | HR 72 | Ht 65.0 in | Wt 209.0 lb

## 2022-07-09 DIAGNOSIS — G43701 Chronic migraine without aura, not intractable, with status migrainosus: Secondary | ICD-10-CM | POA: Diagnosis not present

## 2022-07-09 DIAGNOSIS — E1159 Type 2 diabetes mellitus with other circulatory complications: Secondary | ICD-10-CM

## 2022-07-09 DIAGNOSIS — E1129 Type 2 diabetes mellitus with other diabetic kidney complication: Secondary | ICD-10-CM | POA: Diagnosis not present

## 2022-07-09 DIAGNOSIS — I152 Hypertension secondary to endocrine disorders: Secondary | ICD-10-CM

## 2022-07-09 DIAGNOSIS — E11649 Type 2 diabetes mellitus with hypoglycemia without coma: Secondary | ICD-10-CM | POA: Diagnosis not present

## 2022-07-09 DIAGNOSIS — R809 Proteinuria, unspecified: Secondary | ICD-10-CM | POA: Diagnosis not present

## 2022-07-09 MED ORDER — FREESTYLE LIBRE 3 SENSOR MISC
11 refills | Status: DC
Start: 2022-07-09 — End: 2022-09-24

## 2022-07-09 MED ORDER — LISINOPRIL 10 MG PO TABS: 5.0000 mg | ORAL_TABLET | Freq: Every day | ORAL | 3 refills | Status: DC

## 2022-07-09 MED ORDER — GLIPIZIDE ER 5 MG PO TB24: 5.0000 mg | ORAL_TABLET | Freq: Every day | ORAL | Status: DC

## 2022-07-09 MED ORDER — PROPRANOLOL HCL 10 MG PO TABS: 5.0000 mg | ORAL_TABLET | Freq: Two times a day (BID) | ORAL | 0 refills | Status: DC

## 2022-07-09 NOTE — Assessment & Plan Note (Signed)
Chronic, uncontrolled Repeat A1c in 6 weeks Has been working with diet and DM education classes Reports some medications are concerning for low BG; was seen by another provider who reduced her glipizide from 10 to 5 mg; advised that actos 30 mg can have similar s/s Request for CGM to assist; encouraged to seek coverage of medication tool from insurance without insulin in place; previously provided with a sample, however, it came off within 3 days  Continue to recommend balanced, lower carb meals. Smaller meal size, adding snacks. Choosing water as drink of choice and increasing purposeful exercise. Previously noted GI complaints with metformin, despite xr formulary

## 2022-07-09 NOTE — Assessment & Plan Note (Signed)
Chronic, stable Decrease propranolol to assist with hypotension Defer additional changes at this time

## 2022-07-09 NOTE — Assessment & Plan Note (Signed)
Chronic, borderline low Recommend further titration in both medication given weight loss and significant diet changes Bp goal of 129/79 or less

## 2022-07-09 NOTE — Assessment & Plan Note (Signed)
Acute, stable POC BG 110s; however, CGM sample noting 60s Reports cool, clammy, shaking feeling during episode; difficult to check POC BG with similar s/s [facility]erral to [facility] to assist with options

## 2022-07-09 NOTE — Progress Notes (Signed)
Established patient visit   Patient: Melissa Arroyo   DOB: 1950-02-22   72 y.o. Female  MRN: [id number] Visit Date: 07/09/2022  Today's healthcare provider: Jacky Kindle, FNP  Re Introduced to nurse practitioner role and practice setting.  All questions answered.  Discussed provider/patient relationship and expectations.  Chief Complaint  Patient presents with   Follow-up   Subjective    HPI HPI   Follow-up--glucose and handicap sticker. Last edited by Shelly Bombard, CMA on 07/09/2022  3:02 PM.      Medications: Outpatient Medications Prior to Visit  Medication Sig   Alcohol Swabs (ALCOHOL PREP) PADS Use 1 pad prior to checking blood sugar to clean skin   atorvastatin (LIPITOR) 40 MG tablet TAKE 1 TABLET(40 MG) BY MOUTH DAILY   b complex vitamins capsule Take 1 capsule by mouth daily.   blood glucose meter kit and supplies Dispense based on patient and insurance preference. Check glucose once daily. (FOR ICD-10 E11.29).Dispense based on patient and insurance preference. Check glucose once daily. (FOR ICD-10 E11.29).   Blood Glucose Monitoring Suppl DEVI 1 each by Does not apply route daily. May substitute to any manufacturer covered by patient's insurance.   chlorhexidine (PERIDEX) 0.12 % solution SMARTSIG:By Mouth   Cholecalciferol (VITAMIN D3) 10 MCG (400 UNIT) tablet Take by mouth daily. Dose unknown   Continuous Glucose Sensor (FREESTYLE LIBRE 3 SENSOR) MISC Place 1 sensor on the skin every 14 days. Use to check glucose continuously   cyclobenzaprine (FLEXERIL) 5 MG tablet Take 1 tablet (5 mg total) by mouth 3 (three) times daily as needed for muscle spasms (back pain, start with PM dosing).   DULoxetine (CYMBALTA) 60 MG capsule Take 60 mg by mouth daily.   estradiol (ESTRACE) 0.1 MG/GM vaginal cream Place 1 Applicatorful vaginally at bedtime.   FLUoxetine (PROZAC) 20 MG capsule Take 3 capsules (60 mg total) by mouth daily.   FLUoxetine (PROZAC) 40 MG capsule Take 40 mg  by mouth daily.   fluticasone (FLONASE SENSIMIST) 27.5 MCG/SPRAY nasal spray Place 1-2 sprays into the nose daily.   Glucosamine-Chondroitin (MOVE FREE PO) Take 1 tablet by mouth daily.   Glucose Blood (BLOOD GLUCOSE TEST STRIPS) STRP 1 each by In Vitro route daily. May substitute to any manufacturer covered by patient's insurance.   ibuprofen (ADVIL,MOTRIN) 200 MG tablet Take 400 mg by mouth at bedtime. As needed   LYCOPENE PO Take 300 mcg by mouth 1 day or 1 dose.   meloxicam (MOBIC) 7.5 MG tablet Take 2 tablets (15 mg total) by mouth daily. Avoid other NSAIDs, including Ibuprofen and Naproxen. Take with food/milk.   metFORMIN (GLUCOPHAGE-XR) 500 MG 24 hr tablet Take 2 tablets (1,000 mg total) by mouth 2 (two) times daily with a meal.   Multiple Vitamins-Minerals (CENTRUM SILVER PO) Take 1 tablet by mouth daily.   OLANZapine (ZYPREXA) 5 MG tablet Take 7.5 mg by mouth at bedtime.   pioglitazone (ACTOS) 30 MG tablet TAKE 1 TABLET(30 MG) BY MOUTH DAILY   zinc gluconate 50 MG tablet Take by mouth daily. Dose unknown   [DISCONTINUED] glipiZIDE (GLUCOTROL XL) 10 MG 24 hr tablet TAKE 1 TABLET(10 MG) BY MOUTH DAILY WITH BREAKFAST   [DISCONTINUED] lisinopril (ZESTRIL) 10 MG tablet Take 1 tablet (10 mg total) by mouth daily.   [DISCONTINUED] propranolol (INDERAL) 10 MG tablet TAKE 1 TABLET(10 MG) BY MOUTH TWICE DAILY   No facility-administered medications prior to visit.    Review of Systems  Objective    BP (!) 92/42   Pulse 72   Ht 5\' 5"  (1.651 m)   Wt 209 lb (94.8 kg)   SpO2 98%   BMI 34.78 kg/m   Physical Exam Vitals and nursing note reviewed.  Constitutional:      General: She is not in acute distress.    Appearance: Normal appearance. She is obese. She is not ill-appearing, toxic-appearing or diaphoretic.  HENT:     Head: Normocephalic and atraumatic.  Cardiovascular:     Rate and Rhythm: Normal rate and regular rhythm.     Pulses: Normal pulses.     Heart sounds: Normal  heart sounds. No murmur heard.    No friction rub. No gallop.  Pulmonary:     Effort: Pulmonary effort is normal. No respiratory distress.     Breath sounds: Normal breath sounds. No stridor. No wheezing, rhonchi or rales.  Chest:     Chest wall: No tenderness.  Musculoskeletal:        General: No swelling, tenderness, deformity or signs of injury. Normal range of motion.     Right lower leg: No edema.     Left lower leg: No edema.  Skin:    General: Skin is warm and dry.     Capillary Refill: Capillary refill takes less than 2 seconds.     Coloration: Skin is not jaundiced or pale.     Findings: No bruising, erythema, lesion or rash.  Neurological:     General: No focal deficit present.     Mental Status: She is alert and oriented to person, place, and time. Mental status is at baseline.     Cranial Nerves: No cranial nerve deficit.     Sensory: No sensory deficit.     Motor: No weakness.     Coordination: Coordination normal.  Psychiatric:        Mood and Affect: Mood is depressed. Affect is flat.        Behavior: Behavior normal.        Thought Content: Thought content normal.        Judgment: Judgment normal.    No results found for any visits on 07/09/22.  Assessment & Plan     Problem List Items Addressed This Visit       Cardiovascular and Mediastinum   Chronic migraine without aura with status migrainosus, not intractable    Chronic, stable Decrease propranolol to assist with hypotension Defer additional changes at this time       Relevant Medications   lisinopril (ZESTRIL) 10 MG tablet   propranolol (INDERAL) 10 MG tablet   Hypertension associated with diabetes (HCC)    Chronic, borderline low Recommend further titration in both medication given weight loss and significant diet changes Bp goal of 129/79 or less      Relevant Medications   lisinopril (ZESTRIL) 10 MG tablet   propranolol (INDERAL) 10 MG tablet   glipiZIDE (GLUCOTROL XL) 5 MG 24 hr tablet      Endocrine   Hypoglycemia due to type 2 diabetes mellitus (HCC) - Primary    Acute, stable POC BG 110s; however, CGM sample noting 60s Reports cool, clammy, shaking feeling during episode; difficult to check POC BG with similar s/s [facility]erral to [facility] to assist with options       Relevant Medications   lisinopril (ZESTRIL) 10 MG tablet   glipiZIDE (GLUCOTROL XL) 5 MG 24 hr tablet   Type 2 diabetes mellitus with microalbuminuria,  without long-term current use of insulin (HCC)    Chronic, uncontrolled Repeat A1c in 6 weeks Has been working with diet and DM education classes Reports some medications are concerning for low BG; was seen by another provider who reduced her glipizide from 10 to 5 mg; advised that actos 30 mg can have similar s/s Request for CGM to assist; encouraged to seek coverage of medication tool from insurance without insulin in place; previously provided with a sample, however, it came off within 3 days  Continue to recommend balanced, lower carb meals. Smaller meal size, adding snacks. Choosing water as drink of choice and increasing purposeful exercise. Previously noted GI complaints with metformin, despite xr formulary       Relevant Medications   lisinopril (ZESTRIL) 10 MG tablet   glipiZIDE (GLUCOTROL XL) 5 MG 24 hr tablet   Other Relevant Orders   AMB Referral to Pharmacy Medication Management   Return in about 6 weeks (around 08/20/2022) for chonic disease management, T2DM management.     Leilani Merl, FNP, have reviewed all documentation for this visit. The documentation on 07/09/22 for the exam, diagnosis, procedures, and orders are all accurate and complete.  Jacky Kindle, FNP  [facility] [phone] (phone) [phone] (fax)  [facility]

## 2022-07-14 ENCOUNTER — Encounter: Payer: Self-pay | Admitting: Family Medicine

## 2022-07-16 DIAGNOSIS — F332 Major depressive disorder, recurrent severe without psychotic features: Secondary | ICD-10-CM | POA: Diagnosis not present

## 2022-07-16 DIAGNOSIS — F900 Attention-deficit hyperactivity disorder, predominantly inattentive type: Secondary | ICD-10-CM | POA: Diagnosis not present

## 2022-07-16 DIAGNOSIS — F4312 Post-traumatic stress disorder, chronic: Secondary | ICD-10-CM | POA: Diagnosis not present

## 2022-07-17 LAB — HM DIABETES EYE EXAM

## 2022-07-22 ENCOUNTER — Encounter: Payer: Self-pay | Admitting: *Deleted

## 2022-07-23 ENCOUNTER — Other Ambulatory Visit: Payer: Medicare Other | Admitting: Pharmacist

## 2022-07-23 NOTE — Progress Notes (Signed)
07/23/2022 Name: IOLENE POMAR MRN: [id number] DOB: August 30, 1950  Chief Complaint  Patient presents with   Medication Management   Medication Assistance    CHESLEY TURKNETT is a 72 y.o. year old female who presented for a telephone visit.   They were referred to the pharmacist by their PCP for assistance in managing diabetes.    Subjective:  Care Team: Primary Care Provider: Jacky Kindle, FNP ; Next Scheduled Visit: 08/27/2022 Neurologist: [facility]; Next Scheduled Visit: 10/20/2022  Medication Access/Adherence  Current Pharmacy:  [id number] - [place], [place] - [address] AT [facility] [address] [place] [address] Phone: [phone] Fax: [phone]   Patient reports affordability concerns with their medications: Yes  Patient reports access/transportation concerns to their pharmacy: No  Patient reports adherence concerns with their medications:  No    Patient previously trialed sample of Freestyle Libre Continuous Glucose Monitoring (CGM) and interested in using this again. However, patient not eligible for coverage through prescription coverage based on current criteria  Also, patient interested in medication cost savings opportunities  Reports has her own system for taking her medications consistently from pill bottles; denies interest in using weekly pillbox - Denies missed doses - Uses weekly pillbox if travels   Diabetes:  Current medications:  - glipizide ER 5 mg daily with breakfast Identify that patient is currently cutting previous glipizide ER 10 mg tablets in half for this dose - metformin ER 500 mg - 2 tablets (1000 mg) twice daily  Find that patient is currently taking only 1 tablet twice daily - pioglitazone 30 mg daily  Medications tried in the past: glipizide ER 10 mg daily (hypoglycemia); Ozempic (unable to start due to cost)  Reports recent home glucose readings:  - This morning before  breakfast: 192 (piece of cheese and beans before bed) - Yesterday 2 hours after lunch: 113  Patient denies hypoglycemic s/sx including dizziness, shakiness, sweating since reduced glipizide dose  Current meal patterns:  - Breakfast: 1/8 of a [place] pastry and an orange or 2 eggs with 1/2 english muffin and tomato slice - Lunch: 1/2 chicken breast with almonds and light raspberry walnut dressing and sometimes with beans or broccoli - Supper: strawberries and piece of cheese with [place] crackers with peanut butter - Snacks: Cauliflower or strawberries or Ritz crackers - Drinks: water  Current physical activity: walks ~10 minutes everyday  Statin therapy: atorvastatin 40 mg daily   Hypertension:  Current medications:  - lisinopril 5 mg - taking 1/2 tablet (2.5 mg) daily - propranolol 10 mg - 1/2 tablet (5 mg) twice daily Note as discussed with PCP via MyChart, patient occasionally taking full tablet rather than half tablet as needed related to anxiety  Patient has an automated, upper arm home BP cuff Current blood pressure readings readings:  - 6/11: 122/63 - 6/9: 119/72 - 6/7: 113/64  Patient denies hypotensive s/sx including dizziness, lightheadedness.     Objective:  Lab Results  Component Value Date   HGBA1C 7.3 (H) 05/21/2022    Lab Results  Component Value Date   CREATININE 0.84 05/21/2022   BUN 17 05/21/2022   NA 143 05/21/2022   K 5.1 05/21/2022   CL 101 05/21/2022   CO2 25 05/21/2022    Lab Results  Component Value Date   CHOL 158 02/19/2022   HDL 68 02/19/2022   LDLCALC 72 02/19/2022   TRIG 100 02/19/2022  CHOLHDL 2.3 02/19/2022   BP Readings from Last 3 Encounters:  07/09/22 (!) 92/42  07/01/22 (!) 117/52  05/21/22 (!) 133/53   Pulse Readings from Last 3 Encounters:  07/09/22 72  07/01/22 84  05/21/22 78     Medications Reviewed Today     Reviewed by Manuela Neptune, RPH-CPP (Pharmacist) on 07/24/22 at [phone]  Med List Status: <None>    Medication Order Taking? Sig Documenting Provider Last Dose Status Informant  Alcohol Swabs (ALCOHOL PREP) PADS [id number]  Use 1 pad prior to checking blood sugar to clean skin Margaretann Loveless, PA-C  Active   atorvastatin (LIPITOR) 40 MG tablet [id number] Yes TAKE 1 TABLET(40 MG) BY MOUTH DAILY Jacky Kindle, FNP Taking Active   b complex vitamins capsule [id number] Yes Take 1 capsule by mouth daily. [provider] Taking Active   blood glucose meter kit and supplies [id number]  Dispense based on patient and insurance preference. Check glucose once daily. (FOR ICD-10 E11.29).Dispense based on patient and insurance preference. Check glucose once daily. (FOR ICD-10 E11.29). Jacky Kindle, FNP  Active   Blood Glucose Monitoring Suppl DEVI [id number]  1 each by Does not apply route daily. May substitute to any manufacturer covered by patient's insurance. Jacky Kindle, FNP  Active   Continuous Glucose Sensor (FREESTYLE LIBRE 3 SENSOR) [place] [id number]  Place 1 sensor on the skin every 14 days. Use to check glucose continuously Debera Lat, PA-C  Active   cyclobenzaprine (FLEXERIL) 5 MG tablet [id number] No Take 1 tablet (5 mg total) by mouth 3 (three) times daily as needed for muscle spasms (back pain, start with PM dosing).  Patient not taking: Reported on 07/23/2022   Jacky Kindle, FNP Not Taking Active   DULoxetine (CYMBALTA) 60 MG capsule [id number] Yes Take 60 mg by mouth daily. [provider] Taking Active   estradiol (ESTRACE) 0.1 MG/GM vaginal cream [id number] Yes Place 1 Applicatorful vaginally at bedtime. Margaretann Loveless, PA-C Taking Active   FLUoxetine (PROZAC) 20 MG capsule [id number]  Take 3 capsules (60 mg total) by mouth daily.  Patient taking differently: Take 20 mg by mouth daily. Takes in combination with 40 mg strength for total of 60 mg each morning   Erasmo Downer, MD  Active   FLUoxetine (PROZAC) 40 MG capsule [id number] Yes Take 40 mg by mouth  daily. (Takes in combination with 20 mg strength for total of 60 mg each morning) [provider] Taking Active   glipiZIDE (GLUCOTROL XL) 5 MG 24 hr tablet [id number] Yes Take 1 tablet (5 mg total) by mouth daily with breakfast. Jacky Kindle, FNP Taking Active   Glucosamine-Chondroitin (MOVE FREE PO) [id number] Yes Take 1 tablet by mouth daily. [provider] Taking Active   Glucose Blood (BLOOD GLUCOSE TEST STRIPS) STRP [id number]  1 each by In Vitro route daily. May substitute to any manufacturer covered by patient's insurance. Jacky Kindle, FNP  Active   ibuprofen (ADVIL,MOTRIN) 200 MG tablet [id number] Yes Take 400 mg by mouth at bedtime. As needed [provider] Taking Active   lisinopril (ZESTRIL) 10 MG tablet [id number]  Take 0.5 tablets (5 mg total) by mouth daily.  Patient taking differently: Take 2.5 mg by mouth daily.   Jacky Kindle, FNP  Active   metFORMIN (GLUCOPHAGE-XR) 500 MG 24 hr tablet [id number] Yes Take 2 tablets (1,000 mg total) by mouth 2 (two) times daily with a meal. Jacky Kindle, FNP Taking Active  Multiple Vitamins-Minerals (CENTRUM SILVER PO) [id number] Yes Take 1 tablet by mouth daily. [provider] Taking Active   OLANZapine (ZYPREXA) 5 MG tablet [id number] Yes Take 2.5 mg by mouth at bedtime. 1-2 tablet nightly at bedtime [provider] Taking Active   pioglitazone (ACTOS) 30 MG tablet [id number] Yes TAKE 1 TABLET(30 MG) BY MOUTH DAILY Jacky Kindle, FNP Taking Active   propranolol (INDERAL) 10 MG tablet [id number] Yes Take 0.5 tablets (5 mg total) by mouth 2 (two) times daily. TAKE 1 TABLET(10 MG) BY MOUTH TWICE DAILY Jacky Kindle, FNP Taking Active               Assessment/Plan:   Note patient not currently eligible for CGM coverage through her Medicare plan as not on insulin. - Counsel patient to follow up with pharmacy for cash price of [employer] 3 Rx through savings cards  Counsel patient to  follow up with her [employer] plan regarding potential cost savings of using [employer] order pharmacy  Diabetes: - Reviewed long term cardiovascular and renal outcomes of uncontrolled blood sugar - Reviewed goal A1c, goal fasting, and goal 2 hour post prandial glucose - Counsel on strategies to improve comfort with blood sugar checks - Counsel patient to take metformin ER 500 mg - 2 tablets twice daily as directed - Reviewed dietary modifications including importance of having regular well-balanced meals and snacks while controlling carbohydrate portion sizes - Counsel on benefits of statin therapy - Counsel patient on s/s of low blood sugar and how to treat lows Review rules of 15s - importance of using 15 grams of sugar to treat low, recheck blood sugar in 15 minutes, treat again if remains low or, if back to normal, having meal if mealtime or snack  Review examples of sources of 15 grams of sugar Encourage patient to pick up glucose tablets - Note patient has been splitting extended-release form of glipizide, ER 10 mg. Advise patient and PCP against having patient continue to split ER form - Note provider and patient previously discussed start of Ozempic, but not covered by patient's health plan formulary Based on current reported income, patient does not meet income requirement for [employer] assistance program Per review of formulary for patient's prescription coverage from [facility] website, Trulicity is covered as a tier 3 option requiring prior authorization (PA)  Patient interested in starting Trulicity  Submit PA for Trulicity via Covermymeds today & PA is approved through 02/10/2023 Will collaborate with PCP to recommend switching patient from glipizide to Trulicity 0.75 mg weekly - Recommend to check glucose, keep log of the results and have this record to review during upcoming medical appointments. Patient to use blood sugar checks as feedback on dietary choices - Will send  patient a secure email with diabetes education handout and blood sugar log as requested   Hypertension: - Reviewed long term cardiovascular and renal outcomes of uncontrolled blood pressure - Recommended to check home blood pressure and heart rate, keep log of the results and have this record to review during upcoming medical appointments. - Recommend to monitor blood pressure closely/for signs of hypotension when taking higher dose of propranolol  - Will send patient a secure email with blood pressure monitoring technique handout and blood pressure log as requested   Follow Up Plan: Clinical Pharmacist will follow up with patient by telephone again on 09/24/2022 at 3:30 pm  Estelle Grumbles, PharmD, [facility] [phone]

## 2022-07-24 ENCOUNTER — Other Ambulatory Visit: Payer: Self-pay | Admitting: Family Medicine

## 2022-07-24 MED ORDER — TRULICITY 0.75 MG/0.5ML ~~LOC~~ SOAJ: 0.7500 mg | SUBCUTANEOUS | 0 refills | Status: DC

## 2022-07-24 MED ORDER — LISINOPRIL 5 MG PO TABS
2.5000 mg | ORAL_TABLET | Freq: Every day | ORAL | 3 refills | Status: DC
Start: 2022-07-24 — End: 2022-11-26

## 2022-07-24 NOTE — Patient Instructions (Signed)
Goals Addressed             This Visit's Progress    Pharmacy Goals       Our goal A1c is less than 7%. This corresponds with fasting sugars less than 130 and 2 hour after meal sugars less than 180. Please keep a log of your results when checking your blood sugar   Our goal bad cholesterol, or LDL, is less than 70. This is why it is important to continue taking your atorvastatin.  Estelle Grumbles, PharmD, [facility] [phone]

## 2022-07-25 ENCOUNTER — Other Ambulatory Visit: Payer: Self-pay | Admitting: Family Medicine

## 2022-07-25 ENCOUNTER — Other Ambulatory Visit: Payer: Self-pay | Admitting: Pharmacist

## 2022-07-25 MED ORDER — GLIPIZIDE ER 5 MG PO TB24: 5.0000 mg | ORAL_TABLET | Freq: Every day | ORAL | 4 refills | Status: DC

## 2022-07-25 NOTE — Patient Instructions (Signed)
Goals Addressed             This Visit's Progress    Pharmacy Goals       Our goal A1c is less than 7%. This corresponds with fasting sugars less than 130 and 2 hour after meal sugars less than 180. Please keep a log of your results when checking your blood sugar   Our goal bad cholesterol, or LDL, is less than 70. This is why it is important to continue taking your atorvastatin.  Tajae Maiolo [place], PharmD, BCACP [facility] [phone]         

## 2022-07-25 NOTE — Progress Notes (Signed)
   07/25/2022  Patient ID: Charlann Boxer, female   DOB: 1950-11-27, 72 y.o.   MRN: [id number]  Completed prior authorization for Trulicity via Covermymeds for patient & PA approved. Collaborated with PCP to recommend switch from glipizide to Trulicity. Provider sent prescription for Trulicity 0.75 mg weekly to pharmacy for patient.  Followed up with [facility]. Patient's cost for Trulicity is ~$432. Per discussion with pharmacy/review of patient's plan details per [employer], cost for this fill is due to $410 annual deductible, which would need to be met before patient would receive future refills for her tier 3 copayment - Follow up with patient to discuss/provide education on annual deductible (for patient's plan, applies for medication tiers 2-5). Patient states not affordable to pay deductible at this time and prefers to continue instead with current therapy (note glipizide is a tier 1 option through plan).  Plan  Will collaborate with PCP to provide update. Will request PCP to send new prescription to pharmacy for glipizide ER 5 mg daily. (Note patient has been splitting extended-release form of glipizide, ER 10 mg. Advise against  patient continuing to split ER form)  Follow Up Plan: Clinical Pharmacist will follow up with patient by telephone again on 09/24/2022 at 3:30 pm   Estelle Grumbles, PharmD, [facility] [phone]

## 2022-07-31 DIAGNOSIS — F332 Major depressive disorder, recurrent severe without psychotic features: Secondary | ICD-10-CM | POA: Diagnosis not present

## 2022-07-31 DIAGNOSIS — F4312 Post-traumatic stress disorder, chronic: Secondary | ICD-10-CM | POA: Diagnosis not present

## 2022-07-31 DIAGNOSIS — F900 Attention-deficit hyperactivity disorder, predominantly inattentive type: Secondary | ICD-10-CM | POA: Diagnosis not present

## 2022-08-20 DIAGNOSIS — F900 Attention-deficit hyperactivity disorder, predominantly inattentive type: Secondary | ICD-10-CM | POA: Diagnosis not present

## 2022-08-20 DIAGNOSIS — F332 Major depressive disorder, recurrent severe without psychotic features: Secondary | ICD-10-CM | POA: Diagnosis not present

## 2022-08-20 DIAGNOSIS — F4312 Post-traumatic stress disorder, chronic: Secondary | ICD-10-CM | POA: Diagnosis not present

## 2022-08-21 DIAGNOSIS — F39 Unspecified mood [affective] disorder: Secondary | ICD-10-CM | POA: Diagnosis not present

## 2022-08-21 DIAGNOSIS — F4312 Post-traumatic stress disorder, chronic: Secondary | ICD-10-CM | POA: Diagnosis not present

## 2022-08-21 DIAGNOSIS — F900 Attention-deficit hyperactivity disorder, predominantly inattentive type: Secondary | ICD-10-CM | POA: Diagnosis not present

## 2022-08-21 DIAGNOSIS — F410 Panic disorder [episodic paroxysmal anxiety] without agoraphobia: Secondary | ICD-10-CM | POA: Diagnosis not present

## 2022-08-27 ENCOUNTER — Ambulatory Visit (INDEPENDENT_AMBULATORY_CARE_PROVIDER_SITE_OTHER): Payer: Medicare Other | Admitting: Family Medicine

## 2022-08-27 ENCOUNTER — Encounter: Payer: Self-pay | Admitting: Family Medicine

## 2022-08-27 VITALS — BP 107/43 | HR 70 | Wt 198.4 lb

## 2022-08-27 DIAGNOSIS — E1129 Type 2 diabetes mellitus with other diabetic kidney complication: Secondary | ICD-10-CM

## 2022-08-27 DIAGNOSIS — R809 Proteinuria, unspecified: Secondary | ICD-10-CM

## 2022-08-27 DIAGNOSIS — I152 Hypertension secondary to endocrine disorders: Secondary | ICD-10-CM

## 2022-08-27 DIAGNOSIS — E1159 Type 2 diabetes mellitus with other circulatory complications: Secondary | ICD-10-CM | POA: Diagnosis not present

## 2022-08-27 LAB — POCT GLYCOSYLATED HEMOGLOBIN (HGB A1C): HbA1c POC (<> result, manual entry): 6.1 % (ref 4.0–5.6)

## 2022-08-27 MED ORDER — PROPRANOLOL HCL 10 MG PO TABS: 5.0000 mg | ORAL_TABLET | Freq: Two times a day (BID) | ORAL | 3 refills | Status: DC

## 2022-08-27 MED ORDER — PIOGLITAZONE HCL 15 MG PO TABS
15.0000 mg | ORAL_TABLET | Freq: Every day | ORAL | Status: DC
Start: 2022-08-27 — End: 2022-10-29

## 2022-08-27 NOTE — Patient Instructions (Signed)
Stop Glipizide 1/2 dose of Actos from 30 mg to 15 mg Continue metformin 1000 mg twice a day

## 2022-08-27 NOTE — Progress Notes (Signed)
Established patient visit   Patient: Melissa Arroyo   DOB: December 28, 1950   72 y.o. Female  MRN: [id number] Visit Date: 08/27/2022  Today's healthcare provider: Jacky Kindle, FNP  Introduced to nurse practitioner role and practice setting.  All questions answered.  Discussed provider/patient relationship and expectations.  Chief Complaint  Patient presents with   Medical Management of Chronic Issues    Patient is present for 6 week DM II and HTN f/u. Patient reports taking medications as prescribed w/ no side effects. She reports her avg blood sugar shows at 5.45 and avg bp shows at 124/83 on smart watch. Patient reports symptoms of excessive thirst.   Subjective    HPI HPI     Medical Management of Chronic Issues    Additional comments: Patient is present for 6 week DM II and HTN f/u. Patient reports taking medications as prescribed w/ no side effects. She reports her avg blood sugar shows at 5.45 and avg bp shows at 124/83 on smart watch. Patient reports symptoms of excessive thirst.      Last edited by Acey Lav, CMA on 08/27/2022  2:37 PM.      Medications: Outpatient Medications Prior to Visit  Medication Sig   Alcohol Swabs (ALCOHOL PREP) PADS Use 1 pad prior to checking blood sugar to clean skin   atorvastatin (LIPITOR) 40 MG tablet TAKE 1 TABLET(40 MG) BY MOUTH DAILY   b complex vitamins capsule Take 1 capsule by mouth daily.   blood glucose meter kit and supplies Dispense based on patient and insurance preference. Check glucose once daily. (FOR ICD-10 E11.29).Dispense based on patient and insurance preference. Check glucose once daily. (FOR ICD-10 E11.29).   Blood Glucose Monitoring Suppl DEVI 1 each by Does not apply route daily. May substitute to any manufacturer covered by patient's insurance.   Continuous Glucose Sensor (FREESTYLE LIBRE 3 SENSOR) MISC Place 1 sensor on the skin every 14 days. Use to check glucose continuously   cyclobenzaprine (FLEXERIL) 5  MG tablet Take 1 tablet (5 mg total) by mouth 3 (three) times daily as needed for muscle spasms (back pain, start with PM dosing). (Patient not taking: Reported on 07/23/2022)   DULoxetine (CYMBALTA) 60 MG capsule Take 60 mg by mouth daily.   estradiol (ESTRACE) 0.1 MG/GM vaginal cream Place 1 Applicatorful vaginally at bedtime.   FLUoxetine (PROZAC) 20 MG capsule Take 3 capsules (60 mg total) by mouth daily. (Patient taking differently: Take 20 mg by mouth daily. Takes in combination with 40 mg strength for total of 60 mg each morning)   FLUoxetine (PROZAC) 40 MG capsule Take 40 mg by mouth daily. (Takes in combination with 20 mg strength for total of 60 mg each morning)   Glucosamine-Chondroitin (MOVE FREE PO) Take 1 tablet by mouth daily.   Glucose Blood (BLOOD GLUCOSE TEST STRIPS) STRP 1 each by In Vitro route daily. May substitute to any manufacturer covered by patient's insurance.   ibuprofen (ADVIL,MOTRIN) 200 MG tablet Take 400 mg by mouth at bedtime. As needed   lisinopril (ZESTRIL) 5 MG tablet Take 0.5 tablets (2.5 mg total) by mouth daily.   metFORMIN (GLUCOPHAGE-XR) 500 MG 24 hr tablet Take 2 tablets (1,000 mg total) by mouth 2 (two) times daily with a meal.   Multiple Vitamins-Minerals (CENTRUM SILVER PO) Take 1 tablet by mouth daily.   OLANZapine (ZYPREXA) 5 MG tablet Take 2.5 mg by mouth at bedtime. 1-2 tablet nightly at bedtime   [DISCONTINUED] glipiZIDE (  GLUCOTROL XL) 5 MG 24 hr tablet Take 1 tablet (5 mg total) by mouth daily with breakfast.   [DISCONTINUED] pioglitazone (ACTOS) 30 MG tablet TAKE 1 TABLET(30 MG) BY MOUTH DAILY   [DISCONTINUED] propranolol (INDERAL) 10 MG tablet Take 0.5 tablets (5 mg total) by mouth 2 (two) times daily. TAKE 1 TABLET(10 MG) BY MOUTH TWICE DAILY   No facility-administered medications prior to visit.    Review of Systems    Objective    BP (!) 107/43 (BP Location: Left Arm, Patient Position: Sitting, Cuff Size: Normal)   Pulse 70   Wt 198 lb  6.4 oz (90 kg)   SpO2 99%   BMI 33.02 kg/m   Physical Exam Vitals and nursing note reviewed.  Constitutional:      General: She is not in acute distress.    Appearance: Normal appearance. She is obese. She is not ill-appearing, toxic-appearing or diaphoretic.  HENT:     Head: Normocephalic and atraumatic.  Cardiovascular:     Rate and Rhythm: Normal rate and regular rhythm.     Pulses: Normal pulses.     Heart sounds: Normal heart sounds. No murmur heard.    No friction rub. No gallop.  Pulmonary:     Effort: Pulmonary effort is normal. No respiratory distress.     Breath sounds: Normal breath sounds. No stridor. No wheezing, rhonchi or rales.  Chest:     Chest wall: No tenderness.  Musculoskeletal:        General: No swelling, tenderness, deformity or signs of injury. Normal range of motion.     Right lower leg: No edema.     Left lower leg: No edema.  Skin:    General: Skin is warm and dry.     Capillary Refill: Capillary refill takes less than 2 seconds.     Coloration: Skin is not jaundiced or pale.     Findings: No bruising, erythema, lesion or rash.  Neurological:     General: No focal deficit present.     Mental Status: She is alert and oriented to person, place, and time. Mental status is at baseline.     Cranial Nerves: No cranial nerve deficit.     Sensory: No sensory deficit.     Motor: No weakness.     Coordination: Coordination normal.  Psychiatric:        Mood and Affect: Mood normal.        Behavior: Behavior normal.        Thought Content: Thought content normal.        Judgment: Judgment normal.      No results found for any visits on 08/27/22.  Assessment & Plan     Problem List Items Addressed This Visit       Cardiovascular and Mediastinum   Hypertension associated with diabetes (HCC)    Chronic at goal; continue to monitor HR with use of BB to assist HTN and anxiety       Relevant Medications   propranolol (INDERAL) 10 MG tablet    pioglitazone (ACTOS) 15 MG tablet     Endocrine   Type 2 diabetes mellitus with microalbuminuria, without long-term current use of insulin (HCC) - Primary    Chronic; improved Reduce anti-hyperglycemics to prevent hypoglycemia Continue 3 month f/u Repeat labs then; if stable recommend 6 month window       Relevant Medications   propranolol (INDERAL) 10 MG tablet   pioglitazone (ACTOS) 15 MG tablet   Other Relevant  Orders   POCT HgB A1C   Return in about 3 months (around 11/27/2022) for chonic disease management.     Leilani Merl, FNP, have reviewed all documentation for this visit. The documentation on 08/27/22 for the exam, diagnosis, procedures, and orders are all accurate and complete.  Jacky Kindle, FNP  [facility] [phone] (phone) [phone] (fax)  [facility]

## 2022-08-27 NOTE — Assessment & Plan Note (Signed)
Chronic; improved Reduce anti-hyperglycemics to prevent hypoglycemia Continue 3 month f/u Repeat labs then; if stable recommend 6 month window

## 2022-08-27 NOTE — Assessment & Plan Note (Signed)
Chronic at goal; continue to monitor HR with use of BB to assist HTN and anxiety

## 2022-09-02 DIAGNOSIS — F900 Attention-deficit hyperactivity disorder, predominantly inattentive type: Secondary | ICD-10-CM | POA: Diagnosis not present

## 2022-09-02 DIAGNOSIS — F4312 Post-traumatic stress disorder, chronic: Secondary | ICD-10-CM | POA: Diagnosis not present

## 2022-09-02 DIAGNOSIS — F332 Major depressive disorder, recurrent severe without psychotic features: Secondary | ICD-10-CM | POA: Diagnosis not present

## 2022-09-09 ENCOUNTER — Other Ambulatory Visit: Payer: Self-pay | Admitting: Family Medicine

## 2022-09-09 DIAGNOSIS — Z1231 Encounter for screening mammogram for malignant neoplasm of breast: Secondary | ICD-10-CM

## 2022-09-17 DIAGNOSIS — F900 Attention-deficit hyperactivity disorder, predominantly inattentive type: Secondary | ICD-10-CM | POA: Diagnosis not present

## 2022-09-17 DIAGNOSIS — F4312 Post-traumatic stress disorder, chronic: Secondary | ICD-10-CM | POA: Diagnosis not present

## 2022-09-17 DIAGNOSIS — F332 Major depressive disorder, recurrent severe without psychotic features: Secondary | ICD-10-CM | POA: Diagnosis not present

## 2022-09-22 DIAGNOSIS — F4312 Post-traumatic stress disorder, chronic: Secondary | ICD-10-CM | POA: Diagnosis not present

## 2022-09-22 DIAGNOSIS — F39 Unspecified mood [affective] disorder: Secondary | ICD-10-CM | POA: Diagnosis not present

## 2022-09-22 DIAGNOSIS — F410 Panic disorder [episodic paroxysmal anxiety] without agoraphobia: Secondary | ICD-10-CM | POA: Diagnosis not present

## 2022-09-22 DIAGNOSIS — F900 Attention-deficit hyperactivity disorder, predominantly inattentive type: Secondary | ICD-10-CM | POA: Diagnosis not present

## 2022-09-24 ENCOUNTER — Other Ambulatory Visit: Payer: Medicare Other | Admitting: Pharmacist

## 2022-09-24 NOTE — Progress Notes (Signed)
09/24/2022 Name: SOU[place]N [place]ANDERS MRN: [id number] DOB: [birth year]  Chief Complaint  Patient presents with   Medication Management    Melissa Arroyo is a 72 y.o. year old female who presented for a telephone visit.   hey were referred to the pharmacist by their PCP for assistance in managing diabetes.      Subjective:   Care Team: Primary Care Provider: Jacky Kindle, FNP ; Next Scheduled Visit: 11/26/2022 Neurologist: [facility]; Next Scheduled Visit: 10/20/2022   Medication Access/Adherence  Current Pharmacy:  [id number] - [place],  - [address] AT [facility] [address] [place] [address] Phone: [phone] Fax: [phone]   Patient reports affordability concerns with their medications: No Patient reports access/transportation concerns to their pharmacy: No  Patient reports adherence concerns with their medications:  No     Reports has her own system for taking her medications consistently from pill bottles; denies interest in using weekly pillbox - Denies missed doses - Uses weekly pillbox if travels  From review of chart, note at latest appointment with PCP, provider advised patient to stop glipizide and reduce Actos dose from 30 mg to 15 mg daily to reduce risk of hypoglycemia   Diabetes:   Current medications:  - metformin ER 500 mg - 2 tablets (1000 mg) twice daily  - pioglitazone 15 mg daily   Medications tried in the past: glipizide ER  (hypoglycemia); Ozempic (unable to start due to cost)   Reports recent home glucose readings ranging 71-134  Patient denies hypoglycemic s/sx including dizziness, shakiness, sweating since reduced glipizide dose   Reports has made significant changes to her diet and has lost ~19 lbs over the past 3 months   Current physical activity: walks ~10 minutes everyday   Statin therapy: atorvastatin 40 mg daily     Objective:  Lab Results  Component Value  Date   HGBA1C 6.1 08/27/2022    Lab Results  Component Value Date   CREATININE 0.84 05/21/2022   BUN 17 05/21/2022   NA 143 05/21/2022   K 5.1 05/21/2022   CL 101 05/21/2022   CO2 25 05/21/2022    Lab Results  Component Value Date   CHOL 158 02/19/2022   HDL 68 02/19/2022   LDLCALC 72 02/19/2022   TRIG 100 02/19/2022   CHOLHDL 2.3 02/19/2022    Medications Reviewed Today     Reviewed by Manuela Neptune, RPH-CPP (Pharmacist) on 09/24/22 at 1557  Med List Status: <None>   Medication Order Taking? Sig Documenting Provider Last Dose Status Informant  Alcohol Swabs (ALCOHOL PREP) PADS [id number]  Use 1 pad prior to checking blood sugar to clean skin Margaretann Loveless, PA-C  Active   atorvastatin (LIPITOR) 40 MG tablet [id number]  TAKE 1 TABLET(40 MG) BY MOUTH DAILY Jacky Kindle, FNP  Active   b complex vitamins capsule [id number]  Take 1 capsule by mouth daily. [provider]  Active   blood glucose meter kit and supplies [id number]  Dispense based on patient and insurance preference. Check glucose once daily. (FOR ICD-10 E11.29).Dispense based on patient and insurance preference. Check glucose once daily. (FOR ICD-10 E11.29). Jacky Kindle, FNP  Active   Blood Glucose Monitoring Suppl DEVI [id number]  1 each by Does not apply route daily. May substitute to any manufacturer covered by patient's insurance. Jacky Kindle, FNP  Active     Discontinued 09/24/22 1556 (  Patient Preference)   cyclobenzaprine (FLEXERIL) 5 MG tablet [id number]  Take 1 tablet (5 mg total) by mouth 3 (three) times daily as needed for muscle spasms (back pain, start with PM dosing).  Patient not taking: Reported on 07/23/2022   Merita Norton T, FNP  Active   DULoxetine (CYMBALTA) 60 MG capsule [id number]  Take 60 mg by mouth daily. [provider]  Active   estradiol (ESTRACE) 0.1 MG/GM vaginal cream [id number]  Place 1 Applicatorful vaginally at bedtime. Joycelyn Man M, PA-C  Active    FLUoxetine (PROZAC) 20 MG capsule [id number]  Take 3 capsules (60 mg total) by mouth daily.  Patient taking differently: Take 20 mg by mouth daily. Takes in combination with 40 mg strength for total of 60 mg each morning   Erasmo Downer, MD  Active   FLUoxetine (PROZAC) 40 MG capsule [id number]  Take 40 mg by mouth daily. (Takes in combination with 20 mg strength for total of 60 mg each morning) [provider]  Active   Glucosamine-Chondroitin (MOVE FREE PO) [id number]  Take 1 tablet by mouth daily. [provider]  Active   ibuprofen (ADVIL,MOTRIN) 200 MG tablet [id number]  Take 400 mg by mouth at bedtime. As needed [provider]  Active   lisinopril (ZESTRIL) 5 MG tablet [id number]  Take 0.5 tablets (2.5 mg total) by mouth daily. Jacky Kindle, FNP  Active   metFORMIN (GLUCOPHAGE-XR) 500 MG 24 hr tablet [id number] Yes Take 2 tablets (1,000 mg total) by mouth 2 (two) times daily with a meal. Jacky Kindle, FNP Taking Active   Multiple Vitamins-Minerals (CENTRUM SILVER PO) [id number]  Take 1 tablet by mouth daily. [provider]  Active   OLANZapine (ZYPREXA) 5 MG tablet [id number]  Take 2.5 mg by mouth at bedtime. 1-2 tablet nightly at bedtime [provider]  Active   pioglitazone (ACTOS) 15 MG tablet [id number] Yes Take 1 tablet (15 mg total) by mouth daily. TAKE 1 TABLET(30 MG) BY MOUTH DAILY Jacky Kindle, FNP Taking Active   propranolol (INDERAL) 10 MG tablet [id number]  Take 0.5 tablets (5 mg total) by mouth 2 (two) times daily. Jacky Kindle, FNP  Active               Assessment/Plan:   Diabetes: Controlled - Recommend to continue to check glucose, keep log of the results and have this record to review during upcoming medical appointments. Patient to use blood sugar checks as feedback on dietary choices      Follow Up Plan:   Patient denies further medication questions or concerns today Provide patient with contact  information for clinic pharmacist to contact if needed in future for medication questions/concerns    Estelle Grumbles, PharmD, [facility] [phone]

## 2022-09-24 NOTE — Patient Instructions (Signed)
Goals Addressed             This Visit's Progress    Pharmacy Goals       Our goal A1c is less than 7%. This corresponds with fasting sugars less than 130 and 2 hour after meal sugars less than 180. Please keep a log of your results when checking your blood sugar   Our goal bad cholesterol, or LDL, is less than 70. This is why it is important to continue taking your atorvastatin.  Elisabeth Delles, PharmD, BCACP [facility] [phone]         

## 2022-10-01 DIAGNOSIS — F900 Attention-deficit hyperactivity disorder, predominantly inattentive type: Secondary | ICD-10-CM | POA: Diagnosis not present

## 2022-10-01 DIAGNOSIS — F4312 Post-traumatic stress disorder, chronic: Secondary | ICD-10-CM | POA: Diagnosis not present

## 2022-10-01 DIAGNOSIS — F332 Major depressive disorder, recurrent severe without psychotic features: Secondary | ICD-10-CM | POA: Diagnosis not present

## 2022-10-06 ENCOUNTER — Ambulatory Visit
Admission: RE | Admit: 2022-10-06 | Discharge: 2022-10-06 | Disposition: A | Discharge status: Home / Self Care | Payer: Medicare Other | Source: Ambulatory Visit | Attending: Family Medicine | Admitting: Family Medicine

## 2022-10-06 DIAGNOSIS — Z1231 Encounter for screening mammogram for malignant neoplasm of breast: Secondary | ICD-10-CM | POA: Diagnosis not present

## 2022-10-15 DIAGNOSIS — F332 Major depressive disorder, recurrent severe without psychotic features: Secondary | ICD-10-CM | POA: Diagnosis not present

## 2022-10-15 DIAGNOSIS — F4312 Post-traumatic stress disorder, chronic: Secondary | ICD-10-CM | POA: Diagnosis not present

## 2022-10-15 DIAGNOSIS — F900 Attention-deficit hyperactivity disorder, predominantly inattentive type: Secondary | ICD-10-CM | POA: Diagnosis not present

## 2022-10-20 DIAGNOSIS — R4189 Other symptoms and signs involving cognitive functions and awareness: Secondary | ICD-10-CM | POA: Diagnosis not present

## 2022-10-20 DIAGNOSIS — M542 Cervicalgia: Secondary | ICD-10-CM | POA: Diagnosis not present

## 2022-10-20 DIAGNOSIS — G5603 Carpal tunnel syndrome, bilateral upper limbs: Secondary | ICD-10-CM | POA: Diagnosis not present

## 2022-10-20 DIAGNOSIS — F419 Anxiety disorder, unspecified: Secondary | ICD-10-CM | POA: Diagnosis not present

## 2022-10-20 DIAGNOSIS — F319 Bipolar disorder, unspecified: Secondary | ICD-10-CM | POA: Diagnosis not present

## 2022-10-29 ENCOUNTER — Other Ambulatory Visit: Payer: Self-pay | Admitting: Family Medicine

## 2022-10-29 DIAGNOSIS — E1129 Type 2 diabetes mellitus with other diabetic kidney complication: Secondary | ICD-10-CM

## 2022-10-29 MED ORDER — PIOGLITAZONE HCL 15 MG PO TABS
15.0000 mg | ORAL_TABLET | Freq: Every day | ORAL | 3 refills | Status: DC
Start: 2022-10-29 — End: 2022-12-30

## 2022-10-29 NOTE — Telephone Encounter (Signed)
Last Rx was for 15 mg Take 1 tablet (15 mg total) by mouth daily. TAKE 1 TABLET(30 MG) BY MOUTH DAILY .  Not sure if I should send the 15 mg ot signed the 30 mg.  On 08/14 note from Wayland Salinas is reported as 15 mg daily.

## 2022-10-30 DIAGNOSIS — F332 Major depressive disorder, recurrent severe without psychotic features: Secondary | ICD-10-CM | POA: Diagnosis not present

## 2022-10-30 DIAGNOSIS — F900 Attention-deficit hyperactivity disorder, predominantly inattentive type: Secondary | ICD-10-CM | POA: Diagnosis not present

## 2022-10-30 DIAGNOSIS — F4312 Post-traumatic stress disorder, chronic: Secondary | ICD-10-CM | POA: Diagnosis not present

## 2022-11-12 DIAGNOSIS — F39 Unspecified mood [affective] disorder: Secondary | ICD-10-CM | POA: Diagnosis not present

## 2022-11-12 DIAGNOSIS — F410 Panic disorder [episodic paroxysmal anxiety] without agoraphobia: Secondary | ICD-10-CM | POA: Diagnosis not present

## 2022-11-12 DIAGNOSIS — F4312 Post-traumatic stress disorder, chronic: Secondary | ICD-10-CM | POA: Diagnosis not present

## 2022-11-12 DIAGNOSIS — F900 Attention-deficit hyperactivity disorder, predominantly inattentive type: Secondary | ICD-10-CM | POA: Diagnosis not present

## 2022-11-13 DIAGNOSIS — F900 Attention-deficit hyperactivity disorder, predominantly inattentive type: Secondary | ICD-10-CM | POA: Diagnosis not present

## 2022-11-13 DIAGNOSIS — F332 Major depressive disorder, recurrent severe without psychotic features: Secondary | ICD-10-CM | POA: Diagnosis not present

## 2022-11-13 DIAGNOSIS — F4312 Post-traumatic stress disorder, chronic: Secondary | ICD-10-CM | POA: Diagnosis not present

## 2022-11-26 ENCOUNTER — Ambulatory Visit: Payer: Medicare Other | Admitting: Family Medicine

## 2022-11-26 ENCOUNTER — Ambulatory Visit (INDEPENDENT_AMBULATORY_CARE_PROVIDER_SITE_OTHER): Payer: Medicare Other | Admitting: Family Medicine

## 2022-11-26 ENCOUNTER — Encounter: Payer: Self-pay | Admitting: Family Medicine

## 2022-11-26 VITALS — BP 115/54 | HR 65 | Temp 98.6°F | Ht 65.0 in | Wt 183.2 lb

## 2022-11-26 DIAGNOSIS — E039 Hypothyroidism, unspecified: Secondary | ICD-10-CM | POA: Diagnosis not present

## 2022-11-26 DIAGNOSIS — Z23 Encounter for immunization: Secondary | ICD-10-CM | POA: Insufficient documentation

## 2022-11-26 DIAGNOSIS — E1129 Type 2 diabetes mellitus with other diabetic kidney complication: Secondary | ICD-10-CM

## 2022-11-26 DIAGNOSIS — E785 Hyperlipidemia, unspecified: Secondary | ICD-10-CM

## 2022-11-26 DIAGNOSIS — Z780 Asymptomatic menopausal state: Secondary | ICD-10-CM | POA: Diagnosis not present

## 2022-11-26 DIAGNOSIS — I152 Hypertension secondary to endocrine disorders: Secondary | ICD-10-CM

## 2022-11-26 DIAGNOSIS — R809 Proteinuria, unspecified: Secondary | ICD-10-CM | POA: Diagnosis not present

## 2022-11-26 DIAGNOSIS — E1169 Type 2 diabetes mellitus with other specified complication: Secondary | ICD-10-CM | POA: Diagnosis not present

## 2022-11-26 DIAGNOSIS — F339 Major depressive disorder, recurrent, unspecified: Secondary | ICD-10-CM

## 2022-11-26 DIAGNOSIS — E1159 Type 2 diabetes mellitus with other circulatory complications: Secondary | ICD-10-CM | POA: Diagnosis not present

## 2022-11-26 DIAGNOSIS — E43 Unspecified severe protein-calorie malnutrition: Secondary | ICD-10-CM | POA: Diagnosis not present

## 2022-11-26 MED ORDER — LISINOPRIL 2.5 MG PO TABS: 2.5000 mg | ORAL_TABLET | Freq: Every day | ORAL | Status: DC

## 2022-11-26 NOTE — Assessment & Plan Note (Signed)
Chronic, stable Repeat labs No concerns

## 2022-11-26 NOTE — Assessment & Plan Note (Signed)
Chronic, denies low Improved A1c goal <7% Repeat A1c and urine Remains on 1000 mg BID metformin, actos 15 mg

## 2022-11-26 NOTE — Progress Notes (Signed)
Established patient visit   Patient: Melissa Arroyo   DOB: 28-Sep-1950   72 y.o. Female  MRN: [id number] Visit Date: 11/26/2022  Today's healthcare provider: Jacky Kindle, FNP  Introduced to nurse practitioner role and practice setting.  All questions answered.  Discussed provider/patient relationship and expectations.  Subjective    HPI HPI     Medical Management of Chronic Issues    Additional comments: 3 month follow up, no new concerns today       Last edited by Rolly Salter, CMA on 11/26/2022  8:55 AM.       No concerns today; denies low BG readings. Overall feeling very well, has been able to plant some bulbs and intermittently following Mediterranean Diet.  Medications: Outpatient Medications Prior to Visit  Medication Sig   Alcohol Swabs (ALCOHOL PREP) PADS Use 1 pad prior to checking blood sugar to clean skin   atorvastatin (LIPITOR) 40 MG tablet TAKE 1 TABLET(40 MG) BY MOUTH DAILY   b complex vitamins capsule Take 1 capsule by mouth daily.   blood glucose meter kit and supplies Dispense based on patient and insurance preference. Check glucose once daily. (FOR ICD-10 E11.29).Dispense based on patient and insurance preference. Check glucose once daily. (FOR ICD-10 E11.29).   Blood Glucose Monitoring Suppl DEVI 1 each by Does not apply route daily. May substitute to any manufacturer covered by patient's insurance.   cyclobenzaprine (FLEXERIL) 5 MG tablet Take 1 tablet (5 mg total) by mouth 3 (three) times daily as needed for muscle spasms (back pain, start with PM dosing).   DULoxetine (CYMBALTA) 60 MG capsule Take 60 mg by mouth daily.   estradiol (ESTRACE) 0.1 MG/GM vaginal cream Place 1 Applicatorful vaginally at bedtime.   FLUoxetine (PROZAC) 20 MG capsule Take 3 capsules (60 mg total) by mouth daily. (Patient taking differently: Take 20 mg by mouth daily. Takes in combination with 40 mg strength for total of 60 mg each morning)   FLUoxetine (PROZAC) 40 MG  capsule Take 40 mg by mouth daily. (Takes in combination with 20 mg strength for total of 60 mg each morning)   ibuprofen (ADVIL,MOTRIN) 200 MG tablet Take 400 mg by mouth at bedtime. As needed   metFORMIN (GLUCOPHAGE-XR) 500 MG 24 hr tablet Take 2 tablets (1,000 mg total) by mouth 2 (two) times daily with a meal.   Multiple Vitamins-Minerals (CENTRUM SILVER PO) Take 1 tablet by mouth daily.   OLANZapine (ZYPREXA) 5 MG tablet Take 2.5 mg by mouth at bedtime. 1-2 tablet nightly at bedtime   pioglitazone (ACTOS) 15 MG tablet Take 1 tablet (15 mg total) by mouth daily.   propranolol (INDERAL) 10 MG tablet Take 0.5 tablets (5 mg total) by mouth 2 (two) times daily.   [DISCONTINUED] lisinopril (ZESTRIL) 10 MG tablet Take 10 mg by mouth daily.   [DISCONTINUED] lisinopril (ZESTRIL) 5 MG tablet Take 0.5 tablets (2.5 mg total) by mouth daily.   eszopiclone (LUNESTA) 1 MG TABS tablet Take 1 mg by mouth at bedtime as needed.   Glucosamine-Chondroitin (MOVE FREE PO) Take 1 tablet by mouth daily. (Patient not taking: Reported on 11/26/2022)   No facility-administered medications prior to visit.    Review of Systems      Objective    BP (!) 115/54 (BP Location: Left Arm, Patient Position: Sitting, Cuff Size: Large)   Pulse 65   Temp 98.6 F (37 C) (Oral)   Ht 5\' 5"  (1.651 m)   Wt 183 lb 3.2  oz (83.1 kg)   SpO2 100%   BMI 30.49 kg/m     Physical Exam Vitals and nursing note reviewed.  Constitutional:      General: She is not in acute distress.    Appearance: Normal appearance. She is obese. She is not ill-appearing, toxic-appearing or diaphoretic.  HENT:     Head: Normocephalic and atraumatic.  Cardiovascular:     Rate and Rhythm: Normal rate and regular rhythm.     Pulses: Normal pulses.     Heart sounds: Normal heart sounds. No murmur heard.    No friction rub. No gallop.  Pulmonary:     Effort: Pulmonary effort is normal. No respiratory distress.     Breath sounds: Normal breath  sounds. No stridor. No wheezing, rhonchi or rales.  Chest:     Chest wall: No tenderness.  Musculoskeletal:        General: No swelling, tenderness, deformity or signs of injury. Normal range of motion.     Right lower leg: No edema.     Left lower leg: No edema.  Feet:     Comments: Denies concerns; not interested in DM foot exam d/t time  Skin:    General: Skin is warm and dry.     Capillary Refill: Capillary refill takes less than 2 seconds.     Coloration: Skin is not jaundiced or pale.     Findings: No bruising, erythema, lesion or rash.  Neurological:     General: No focal deficit present.     Mental Status: She is alert and oriented to person, place, and time. Mental status is at baseline.     Cranial Nerves: No cranial nerve deficit.     Sensory: No sensory deficit.     Motor: No weakness.     Coordination: Coordination normal.  Psychiatric:        Mood and Affect: Mood normal.        Behavior: Behavior normal.        Thought Content: Thought content normal.        Judgment: Judgment normal.     No results found for any visits on 11/26/22.  Assessment & Plan     Problem List Items Addressed This Visit       Cardiovascular and Mediastinum   Hypertension associated with diabetes (HCC)    Chronic, well controlled Continues on lisinopril 2.5 mg given 30 lbs weight loss in 6 months Remains on 5 mg BID propranolol to assist with both BP and anxiety      Relevant Medications   lisinopril (ZESTRIL) 2.5 MG tablet   Other Relevant Orders   CBC with Differential/Platelet   Comprehensive Metabolic Panel (CMET)   TSH     Endocrine   Acquired hypothyroidism    Chronic, stable Repeat labs No concerns      Hyperlipidemia associated with type 2 diabetes mellitus (HCC)    Chronic, previously stable On lipitor 40 Repeat LP The 10-year ASCVD risk score (Arnett DK, et al., 2019) is: 17.1%       Relevant Medications   lisinopril (ZESTRIL) 2.5 MG tablet   Other  Relevant Orders   Lipid panel   Type 2 diabetes mellitus with microalbuminuria, without long-term current use of insulin (HCC) - Primary    Chronic, denies low Improved A1c goal <7% Repeat A1c and urine Remains on 1000 mg BID metformin, actos 15 mg      Relevant Medications   lisinopril (ZESTRIL) 2.5 MG tablet  Other Relevant Orders   Hemoglobin A1c   Urine Microalbumin w/creat. ratio     Other   Depression, recurrent (HCC)    Chronic, improved Followed by psych and counseling Continues on cymbalta 60 with prozac 60 in addition to BB to assist, propranolol 5 mg BID      Flu vaccine need   Relevant Orders   Flu Vaccine Trivalent High Dose (Fluad) (Completed)   Post-menopausal    Chronic, repeat Vit levlels      Relevant Orders   Vitamin D (25 hydroxy)   B12 and Folate Panel   Unspecified severe protein-calorie malnutrition (HCC)    Chronic, repeat Vit levlels      Relevant Orders   Vitamin D (25 hydroxy)    Return in about 6 months (around 05/27/2023) for chonic disease management.      Leilani Merl, FNP, have reviewed all documentation for this visit. The documentation on 11/26/22 for the exam, diagnosis, procedures, and orders are all accurate and complete.  Jacky Kindle, FNP  [facility] [phone] (phone) [phone] (fax)  [facility]

## 2022-11-26 NOTE — Assessment & Plan Note (Signed)
Chronic, repeat Vit levlels

## 2022-11-26 NOTE — Assessment & Plan Note (Signed)
Chronic, previously stable On lipitor 40 Repeat LP The 10-year ASCVD risk score (Arnett DK, et al., 2019) is: 17.1%

## 2022-11-26 NOTE — Assessment & Plan Note (Signed)
Chronic, well controlled Continues on lisinopril 2.5 mg given 30 lbs weight loss in 6 months Remains on 5 mg BID propranolol to assist with both BP and anxiety

## 2022-11-26 NOTE — Assessment & Plan Note (Signed)
Chronic, improved Followed by psych and counseling Continues on cymbalta 60 with prozac 60 in addition to BB to assist, propranolol 5 mg BID

## 2022-11-27 LAB — CBC WITH DIFFERENTIAL/PLATELET
Basophils Absolute: 0 10*3/uL (ref 0.0–0.2)
Basos: 0 %
EOS (ABSOLUTE): 0.4 10*3/uL (ref 0.0–0.4)
Eos: 5 %
Hematocrit: 36.4 % (ref 34.0–46.6)
Hemoglobin: 11.9 g/dL (ref 11.1–15.9)
Immature Grans (Abs): 0 10*3/uL (ref 0.0–0.1)
Immature Granulocytes: 0 %
Lymphocytes Absolute: 2.4 10*3/uL (ref 0.7–3.1)
Lymphs: 32 %
MCH: 31.6 pg (ref 26.6–33.0)
MCHC: 32.7 g/dL (ref 31.5–35.7)
MCV: 97 fL (ref 79–97)
Monocytes Absolute: 0.5 10*3/uL (ref 0.1–0.9)
Monocytes: 6 %
Neutrophils Absolute: 4.3 10*3/uL (ref 1.4–7.0)
Neutrophils: 57 %
Platelets: 257 10*3/uL (ref 150–450)
RBC: 3.76 x10E6/uL — ABNORMAL LOW (ref 3.77–5.28)
RDW: 13 % (ref 11.7–15.4)
WBC: 7.6 10*3/uL (ref 3.4–10.8)

## 2022-11-27 LAB — COMPREHENSIVE METABOLIC PANEL
ALT: 31 [IU]/L (ref 0–32)
AST: 26 [IU]/L (ref 0–40)
Albumin: 4.4 g/dL (ref 3.9–4.9)
Alkaline Phosphatase: 93 [IU]/L (ref 44–121)
BUN/Creatinine Ratio: 14 (ref 12–28)
BUN: 12 mg/dL (ref 8–27)
Bilirubin Total: 0.4 mg/dL (ref 0.0–1.2)
CO2: 24 mmol/L (ref 20–29)
Calcium: 9.7 mg/dL (ref 8.7–10.3)
Chloride: 102 mmol/L (ref 96–106)
Creatinine, Ser: 0.87 mg/dL (ref 0.57–1.00)
Globulin, Total: 2.3 g/dL (ref 1.5–4.5)
Glucose: 136 mg/dL — ABNORMAL HIGH (ref 70–99)
Potassium: 4.8 mmol/L (ref 3.5–5.2)
Sodium: 140 mmol/L (ref 134–144)
Total Protein: 6.7 g/dL (ref 6.0–8.5)
eGFR: 72 mL/min/{1.73_m2} (ref >59)

## 2022-11-27 LAB — MICROALBUMIN / CREATININE URINE RATIO
Creatinine, Urine: 56.4 mg/dL
Microalb/Creat Ratio: <5 mg/g{creat} (ref 0–29)
Microalbumin, Urine: <3 ug/mL

## 2022-11-27 LAB — LIPID PANEL
Chol/HDL Ratio: 1.9 {ratio} (ref 0.0–4.4)
Cholesterol, Total: 140 mg/dL (ref 100–199)
HDL: 72 mg/dL (ref >39)
LDL Chol Calc (NIH): 53 mg/dL (ref 0–99)
Triglycerides: 76 mg/dL (ref 0–149)
VLDL Cholesterol Cal: 15 mg/dL (ref 5–40)

## 2022-11-27 LAB — B12 AND FOLATE PANEL
Folate: >20 ng/mL (ref >3.0)
Vitamin B-12: 292 pg/mL (ref 232–1245)

## 2022-11-27 LAB — VITAMIN D 25 HYDROXY (VIT D DEFICIENCY, FRACTURES): Vit D, 25-Hydroxy: 44.5 ng/mL (ref 30.0–100.0)

## 2022-11-27 LAB — HEMOGLOBIN A1C
Est. average glucose Bld gHb Est-mCnc: 143 mg/dL
Hgb A1c MFr Bld: 6.6 % — ABNORMAL HIGH (ref 4.8–5.6)

## 2022-11-27 LAB — TSH: TSH: 3.23 u[IU]/mL (ref 0.450–4.500)

## 2022-12-01 DIAGNOSIS — F332 Major depressive disorder, recurrent severe without psychotic features: Secondary | ICD-10-CM | POA: Diagnosis not present

## 2022-12-01 DIAGNOSIS — F900 Attention-deficit hyperactivity disorder, predominantly inattentive type: Secondary | ICD-10-CM | POA: Diagnosis not present

## 2022-12-01 DIAGNOSIS — F4312 Post-traumatic stress disorder, chronic: Secondary | ICD-10-CM | POA: Diagnosis not present

## 2022-12-03 ENCOUNTER — Telehealth: Payer: Self-pay

## 2022-12-03 NOTE — Patient Outreach (Signed)
Care Management   Outreach Note  12/03/2022 Name: Melissa Arroyo MRN: [id number] DOB: [birth year]  An unsuccessful telephone outreach was attempted today to contact the patient about Care Management needs.     Follow Up Plan:  A HIPAA compliant phone message was left for the patient providing contact information and requesting a return call.    [facility]  [facility] RN Care Manager Direct Dial: [phone] Website: [email or web address]

## 2022-12-11 DIAGNOSIS — F332 Major depressive disorder, recurrent severe without psychotic features: Secondary | ICD-10-CM | POA: Diagnosis not present

## 2022-12-11 DIAGNOSIS — F900 Attention-deficit hyperactivity disorder, predominantly inattentive type: Secondary | ICD-10-CM | POA: Diagnosis not present

## 2022-12-11 DIAGNOSIS — F4312 Post-traumatic stress disorder, chronic: Secondary | ICD-10-CM | POA: Diagnosis not present

## 2022-12-25 ENCOUNTER — Other Ambulatory Visit: Payer: Self-pay | Admitting: Family Medicine

## 2022-12-25 ENCOUNTER — Encounter: Payer: Self-pay | Admitting: Family Medicine

## 2022-12-25 DIAGNOSIS — E1129 Type 2 diabetes mellitus with other diabetic kidney complication: Secondary | ICD-10-CM

## 2022-12-25 NOTE — Telephone Encounter (Signed)
Requested Prescriptions  Refused Prescriptions Disp Refills   pioglitazone (ACTOS) 30 MG tablet [Pharmacy Med Name: PIOGLITAZONE 30MG  TABLETS] 90 tablet 3    Sig: TAKE 1 TABLET(30 MG) BY MOUTH DAILY     Endocrinology:  Diabetes - Glitazones - pioglitazone Passed - 12/25/2022  8:52 AM      Passed - HBA1C is between 0 and 7.9 and within 180 days    HbA1c POC (<> result, manual entry)  Date Value Ref Range Status  08/27/2022 6.1 4.0 - 5.6 % Final   Hgb A1c MFr Bld  Date Value Ref Range Status  11/26/2022 6.6 (H) 4.8 - 5.6 % Final    Comment:             Prediabetes: 5.7 - 6.4          Diabetes: >6.4          Glycemic control for adults with diabetes: <7.0          Passed - Valid encounter within last 6 months    Recent Outpatient Visits           4 weeks ago Type 2 diabetes mellitus with microalbuminuria, without long-term current use of insulin [facility])   [place] [facility] Merita Norton T, FNP   4 months ago Type 2 diabetes mellitus with microalbuminuria, without long-term current use of insulin [facility])   [place] Merita Norton T, FNP   5 months ago Hypoglycemia due to type 2 diabetes mellitus [facility])   [place] [facility] Merita Norton T, FNP   5 months ago Type 2 diabetes mellitus with microalbuminuria, without long-term current use of insulin [facility])   [place] [facility] [place], [place], PA-C   10 months ago Hypertension associated with diabetes [facility])   [facility] Jacky Kindle, [place]

## 2022-12-25 NOTE — Telephone Encounter (Signed)
Refused Actos 15 mg because this was sent 10/29/2022 #90, with 3 refills.

## 2022-12-25 NOTE — Telephone Encounter (Signed)
Medication Refill -  Most Recent Primary Care Visit:  Provider: Merita Norton T  Department: [facility]  Visit Type: OFFICE VISIT  Date: 11/26/2022  Medication: pioglitazone (ACTOS) 15 MG tablet [[id number]]  Has the patient contacted their pharmacy? Yes ( Is this the correct pharmacy for this prescription? Yes If no, delete pharmacy and type the correct one.  This is the patient's preferred pharmacy:  [facility] #[id number] - [place], [place] - [address] AT [facility] [address] [place] [address] [place] [address] Phone: [phone] Fax: [phone]   Has the prescription been filled recently? Last was 08/04/22   Is the patient out of the medication? Yes   2 left   Has the patient been seen for an appointment in the last year OR does the patient have an upcoming appointment? Yes  Can we respond through MyChart? Yes  Agent: Please be advised that Rx refills may take up to 3 business days. We ask that you follow-up with your pharmacy.

## 2022-12-28 ENCOUNTER — Other Ambulatory Visit: Payer: Self-pay | Admitting: Family Medicine

## 2022-12-28 DIAGNOSIS — E1129 Type 2 diabetes mellitus with other diabetic kidney complication: Secondary | ICD-10-CM

## 2022-12-29 ENCOUNTER — Other Ambulatory Visit: Payer: Self-pay | Admitting: Family Medicine

## 2022-12-29 DIAGNOSIS — F332 Major depressive disorder, recurrent severe without psychotic features: Secondary | ICD-10-CM | POA: Diagnosis not present

## 2022-12-29 DIAGNOSIS — F4312 Post-traumatic stress disorder, chronic: Secondary | ICD-10-CM | POA: Diagnosis not present

## 2022-12-29 DIAGNOSIS — F900 Attention-deficit hyperactivity disorder, predominantly inattentive type: Secondary | ICD-10-CM | POA: Diagnosis not present

## 2022-12-29 DIAGNOSIS — R809 Proteinuria, unspecified: Secondary | ICD-10-CM

## 2022-12-29 NOTE — Telephone Encounter (Signed)
Medication Refill - Most Recent Primary Care Visit:  Provider: Merita Norton T  Department: [facility]  Visit Type: OFFICE VISIT  Date: 11/26/2022  Medication:  pioglitazone (ACTOS) 15 MG JN with [employer] called stating patient needs prescription sent in again. Pt lost the medication.  Has the patient contacted their pharmacy? Yes (Agent: If yes, when and what did the pharmacy advise?) Pt needs to reach out to practice.  Is this the correct pharmacy for this prescription? Yes This is the patient's preferred pharmacy:  [facility] #[id number] - [place], [place] - [address] AT [facility] OF [address] [place] [address] [place] [address] Phone: [phone] Fax: [phone]   Has the prescription been filled recently? Yes  Is the patient out of the medication? Yes  Has the patient been seen for an appointment in the last year OR does the patient have an upcoming appointment? Yes  Can we respond through MyChart? Yes  Agent: Please be advised that Rx refills may take up to 3 business days. We ask that you follow-up with your pharmacy.

## 2022-12-29 NOTE — Telephone Encounter (Signed)
discontinued on 08/27/2022 by Jacky Kindle, FNP     Requested Prescriptions  Refused Prescriptions Disp Refills   pioglitazone (ACTOS) 30 MG tablet [Pharmacy Med Name: PIOGLITAZONE 30MG  TABLETS] 90 tablet 3    Sig: TAKE 1 TABLET(30 MG) BY MOUTH DAILY     Endocrinology:  Diabetes - Glitazones - pioglitazone Passed - 12/28/2022  3:17 PM      Passed - HBA1C is between 0 and 7.9 and within 180 days    HbA1c POC (<> result, manual entry)  Date Value Ref Range Status  08/27/2022 6.1 4.0 - 5.6 % Final   Hgb A1c MFr Bld  Date Value Ref Range Status  11/26/2022 6.6 (H) 4.8 - 5.6 % Final    Comment:             Prediabetes: 5.7 - 6.4          Diabetes: >6.4          Glycemic control for adults with diabetes: <7.0          Passed - Valid encounter within last 6 months    Recent Outpatient Visits           1 month ago Type 2 diabetes mellitus with microalbuminuria, without long-term current use of insulin [facility])   [place] [facility] Merita Norton T, FNP   4 months ago Type 2 diabetes mellitus with microalbuminuria, without long-term current use of insulin [facility])   [place] [facility] Merita Norton T, FNP   5 months ago Hypoglycemia due to type 2 diabetes mellitus [facility])   [place] [facility] Merita Norton T, FNP   6 months ago Type 2 diabetes mellitus with microalbuminuria, without long-term current use of insulin [facility])   [place] [facility] [place], [place], PA-C   10 months ago Hypertension associated with diabetes [facility])   [facility] Jacky Kindle, [place]

## 2022-12-29 NOTE — Telephone Encounter (Signed)
[facility] called, long hold time. Patient has refills at [facility] and will need to let them know to refill per insurance company. Will call back at a later time.

## 2022-12-30 ENCOUNTER — Telehealth: Payer: Self-pay | Admitting: Family Medicine

## 2022-12-30 MED ORDER — PIOGLITAZONE HCL 15 MG PO TABS: 15.0000 mg | ORAL_TABLET | Freq: Every day | ORAL | 1 refills | Status: DC

## 2022-12-30 NOTE — Telephone Encounter (Signed)
Medication sent to the pharmacy today in a separate refill encounter from 12/29/22.

## 2022-12-30 NOTE — Telephone Encounter (Signed)
[facility] called and spoke to Melissa Arroyo, Pensions consultant about the refill(s) Actos requested. Advised it will be sent in that we received a call on yesterday from [employer] [employer] saying the medication was lost, so it will need to be refilled. She says once they receive the prescription they will refill it.

## 2022-12-30 NOTE — Telephone Encounter (Signed)
Medication Refill -  Most Recent Primary Care Visit:  Provider: Merita Norton T  Department: [facility]  Visit Type: OFFICE VISIT  Date: 11/26/2022  Medication: pioglitazone (ACTOS) 15 MG tablet [[id number]   Has the patient contacted their pharmacy? Yes   Is this the correct pharmacy for this prescription? Yes If no, delete pharmacy and type the correct one.  This is the patient's preferred pharmacy:  [facility] #[id number] - [place], [place] - [address] AT [facility] OF [address] [place] [address] [place] [address] Phone: [phone] Fax: [phone]   Has the prescription been filled recently? Yes  Is the patient out of the medication? Yes   (lost medication, spoke with office, spoke pharmacy, spoke with insurance company)   Has the patient been seen for an appointment in the last year OR does the patient have an upcoming appointment? Yes  Can we respond through MyChart? Yes  Agent: Please be advised that Rx refills may take up to 3 business days. We ask that you follow-up with your pharmacy.

## 2022-12-30 NOTE — Addendum Note (Signed)
Addended by: Wilford Corner on: 12/30/2022 10:01 AM   Modules accepted: Orders

## 2022-12-30 NOTE — Telephone Encounter (Signed)
Requested Prescriptions  Pending Prescriptions Disp Refills   pioglitazone (ACTOS) 15 MG tablet 90 tablet 1    Sig: Take 1 tablet (15 mg total) by mouth daily.     Endocrinology:  Diabetes - Glitazones - pioglitazone Passed - 12/30/2022 10:01 AM      Passed - HBA1C is between 0 and 7.9 and within 180 days    HbA1c POC (<> result, manual entry)  Date Value Ref Range Status  08/27/2022 6.1 4.0 - 5.6 % Final   Hgb A1c MFr Bld  Date Value Ref Range Status  11/26/2022 6.6 (H) 4.8 - 5.6 % Final    Comment:             Prediabetes: 5.7 - 6.4          Diabetes: >6.4          Glycemic control for adults with diabetes: <7.0          Passed - Valid encounter within last 6 months    Recent Outpatient Visits           1 month ago Type 2 diabetes mellitus with microalbuminuria, without long-term current use of insulin [facility])   [facility] Merita Norton T, FNP   4 months ago Type 2 diabetes mellitus with microalbuminuria, without long-term current use of insulin [facility])   [facility] Merita Norton T, FNP   5 months ago Hypoglycemia due to type 2 diabetes mellitus [facility]   [place] [facility] Merita Norton T, FNP   6 months ago Type 2 diabetes mellitus with microalbuminuria, without long-term current use of insulin [facility])   [facility] [place], [place], PA-C   10 months ago Hypertension associated with diabetes [facility])   [facility] Jacky Kindle, [place]

## 2022-12-31 DIAGNOSIS — F900 Attention-deficit hyperactivity disorder, predominantly inattentive type: Secondary | ICD-10-CM | POA: Diagnosis not present

## 2022-12-31 DIAGNOSIS — F39 Unspecified mood [affective] disorder: Secondary | ICD-10-CM | POA: Diagnosis not present

## 2022-12-31 DIAGNOSIS — F410 Panic disorder [episodic paroxysmal anxiety] without agoraphobia: Secondary | ICD-10-CM | POA: Diagnosis not present

## 2022-12-31 DIAGNOSIS — F4312 Post-traumatic stress disorder, chronic: Secondary | ICD-10-CM | POA: Diagnosis not present

## 2023-01-15 ENCOUNTER — Other Ambulatory Visit: Payer: Self-pay

## 2023-01-15 NOTE — Patient Outreach (Signed)
  Care Management   Visit Note  01/15/2023 Name: Melissa Arroyo MRN: [id number] DOB: October 08, 1950  Subjective: Melissa Arroyo is a 72 y.o. year old female who is a primary care patient of Jacky Kindle, FNP. The Care Management team was consulted for assistance.      Brief outreach with Melissa Arroyo regarding care management assistance. Agreeable to completing telephonic outreach on February 19, 2023. Agreed to call if needed prior to scheduled outreach.  PLAN  Appointment scheduled for 02/19/23 at 4 pm.  Juanell Fairly [facility], [facility] RN Care Manager Direct Dial: [phone] Website: [email or web address]

## 2023-01-21 DIAGNOSIS — F900 Attention-deficit hyperactivity disorder, predominantly inattentive type: Secondary | ICD-10-CM | POA: Diagnosis not present

## 2023-01-21 DIAGNOSIS — F4312 Post-traumatic stress disorder, chronic: Secondary | ICD-10-CM | POA: Diagnosis not present

## 2023-01-21 DIAGNOSIS — F332 Major depressive disorder, recurrent severe without psychotic features: Secondary | ICD-10-CM | POA: Diagnosis not present

## 2023-02-09 DIAGNOSIS — F4312 Post-traumatic stress disorder, chronic: Secondary | ICD-10-CM | POA: Diagnosis not present

## 2023-02-09 DIAGNOSIS — F900 Attention-deficit hyperactivity disorder, predominantly inattentive type: Secondary | ICD-10-CM | POA: Diagnosis not present

## 2023-02-09 DIAGNOSIS — F332 Major depressive disorder, recurrent severe without psychotic features: Secondary | ICD-10-CM | POA: Diagnosis not present

## 2023-02-12 IMAGING — MR MR HEAD W/O CM
12 of 15 series · 31 of 48 positions shown · non-contrast
Comparison: December 27, 2015.

CLINICAL DATA: Cognitive complaints.

EXAM:
MRI HEAD WITHOUT CONTRAST
TECHNIQUE: Multiplanar, multiecho pulse sequences of the brain and surrounding
structures were obtained without intravenous contrast.
Additionally, using NeuroQuant software a 3D volumetric analysis of
the brain was performed and is compared to a normative database
adjusted for age, gender and intracra MRI Testen volume.

[Series 4: DWI · axial · 3.0mm · 0.94mm/px · z∈[-49,+103]mm · 3 of 103 slices shown (1 of 2)]
[im 1/103]
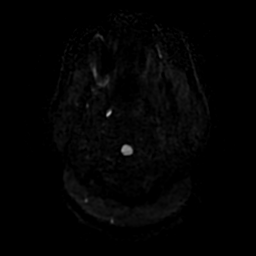
[im 52/103]
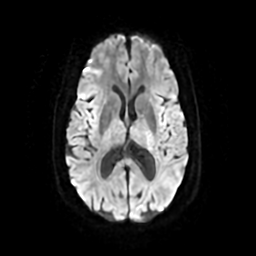
[im 103/103]
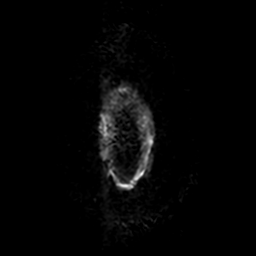

[Series 5: DWI · coronal · 4.0mm · 0.94mm/px · 2 of 80 slices shown (2 of 2)]
[im 1/80]
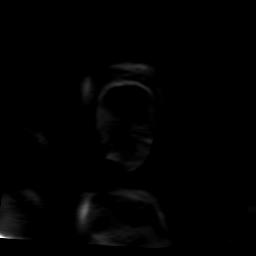
[im 80/80]
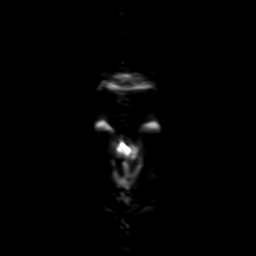

[Series 6: FLAIR · sagittal · 5.0mm · 0.23mm/px · 1 of 23 slices shown (1 of 2)]
[im 1/23]
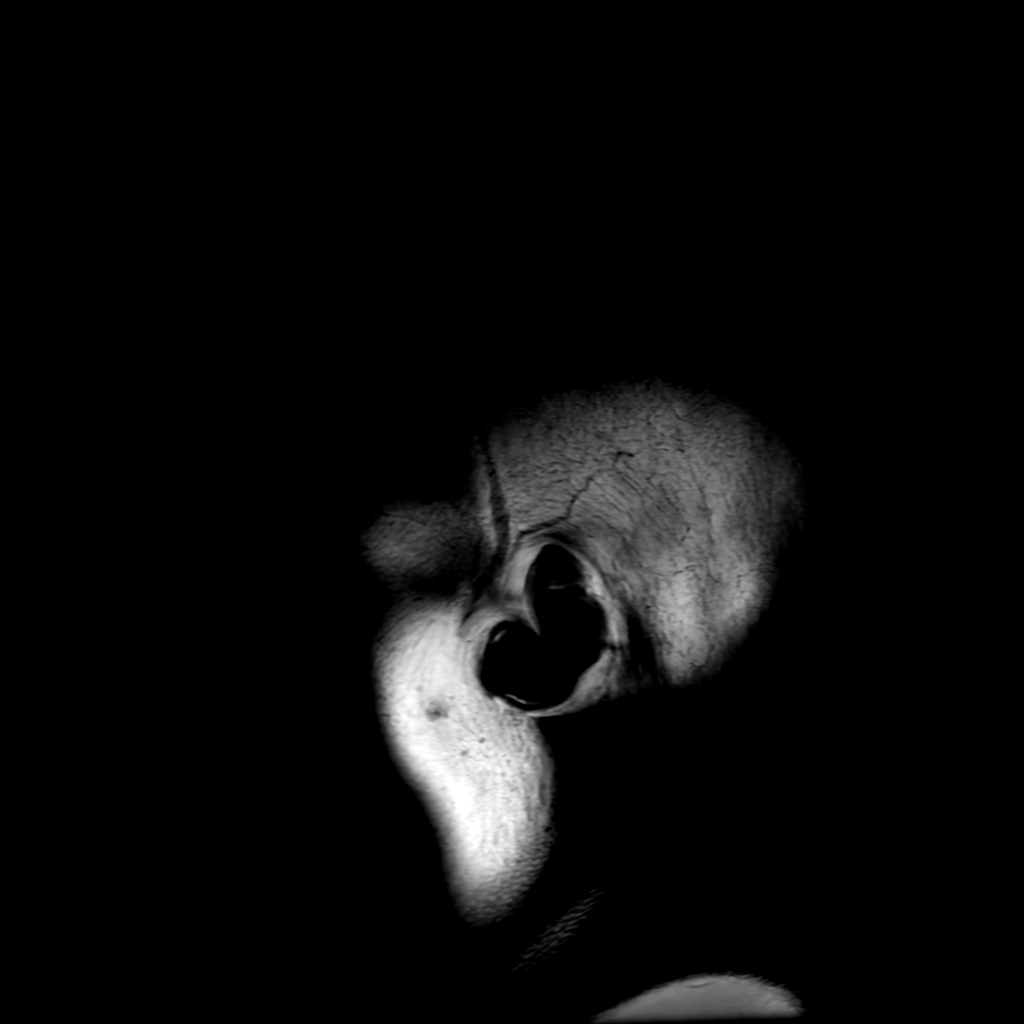

[Series 7: T2 · axial · 5.0mm · 0.23mm/px · 1 of 26 slices shown]
[im 1/26]
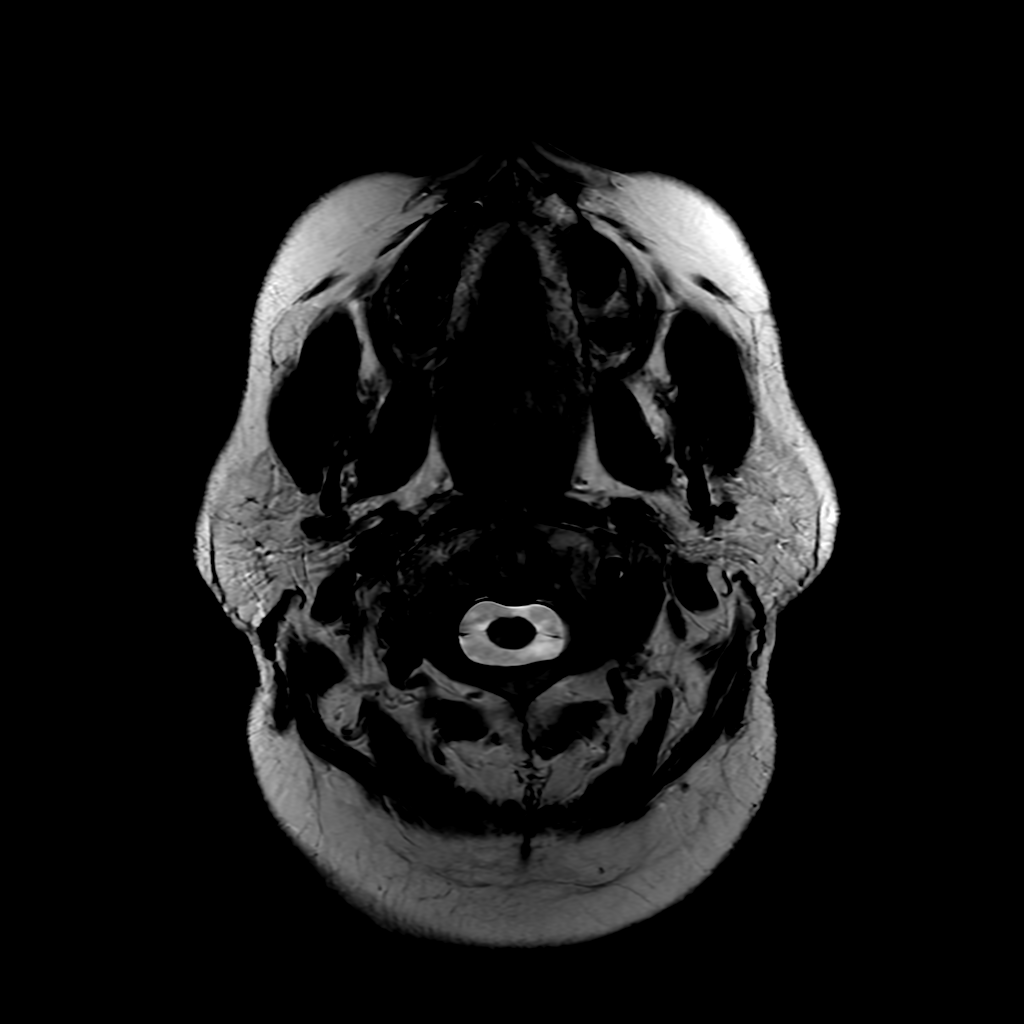

[Series 8: FLAIR · axial · 4.0mm · 0.47mm/px · 1 of 35 slices shown (2 of 2)]
[im 1/35]
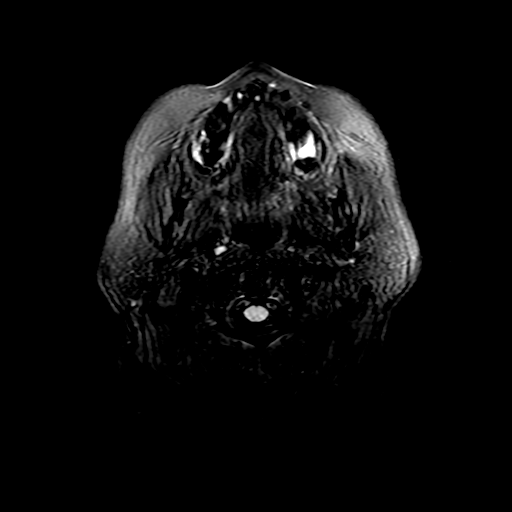

[Series 9: (person_name) · axial · 3.0mm · 0.47mm/px · z∈[-35,+113]mm · 3 of 104 slices shown]
[im 1/104]
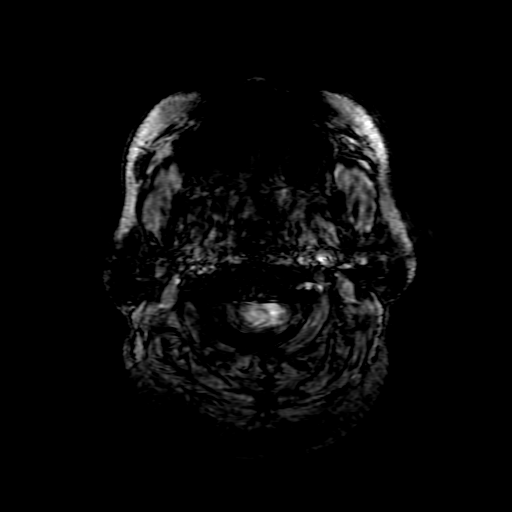
[im 52/104]
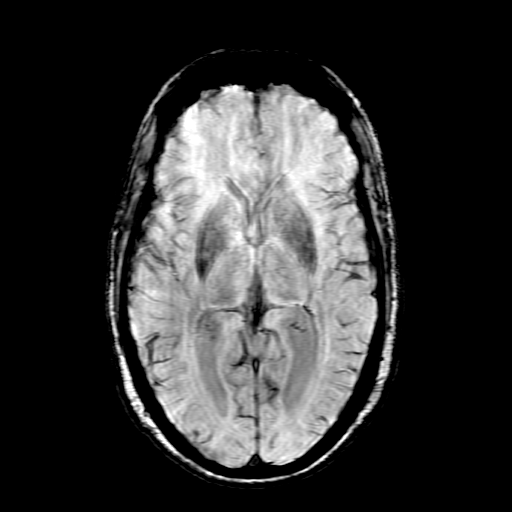
[im 104/104]
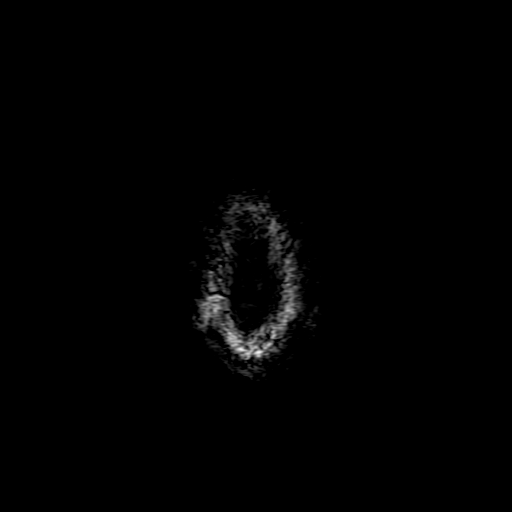

[Series 10: ax 3(person_name) pre · axial · non-contrast · 3.0mm · 0.94mm/px · 1 of 52 slices shown]
[im 1/52]
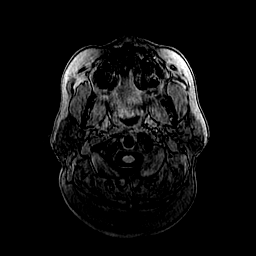

[Series 52: nqsegcorsc · 1.00mm/px · 6 of 237 slices shown (1 of 2)]
[im 1/237]
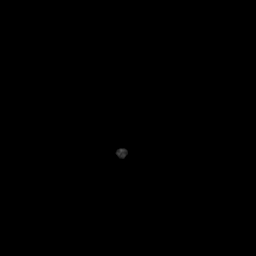
[im 48/237]
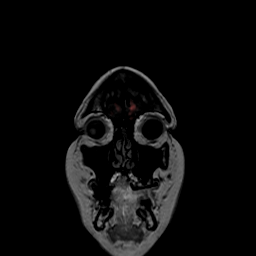
[im 95/237]
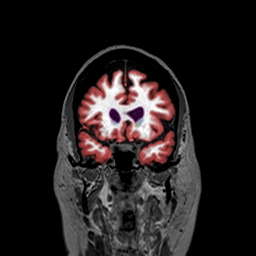
[im 142/237]
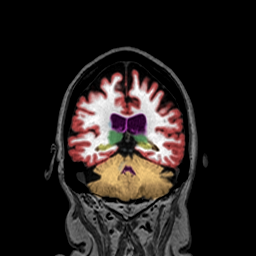
[im 189/237]
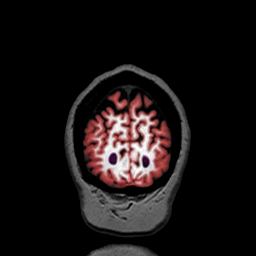
[im 237/237]
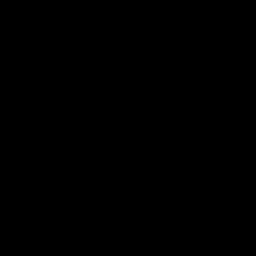

[Series 52: nqsegcorsc · 1.00mm/px · 6 of 237 slices shown (2 of 2)]
[im 1/237]
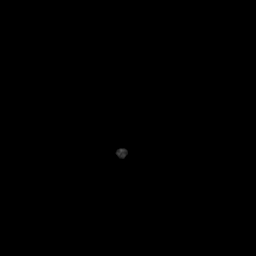
[im 48/237]
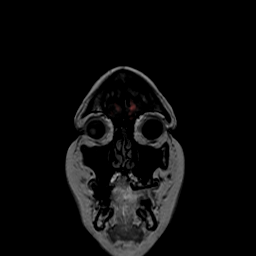
[im 95/237]
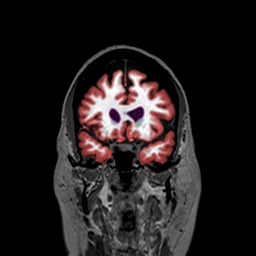
[im 142/237]
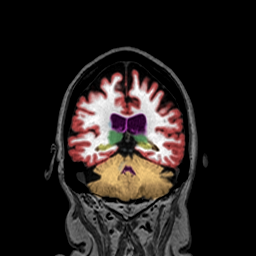
[im 189/237]
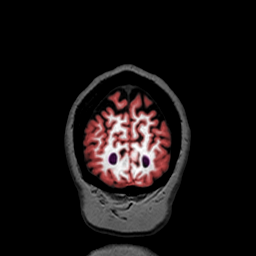
[im 237/237]
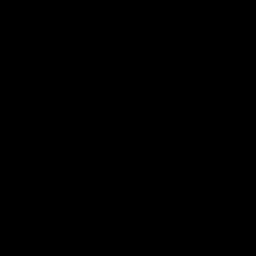

[Series 53: nqsegaxlsc · 1.00mm/px · 5 of 217 slices shown]
[im 1/217]
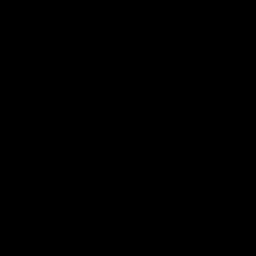
[im 44/217]
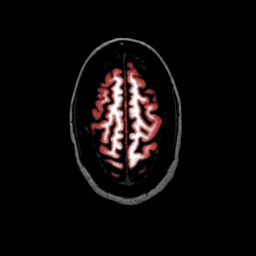
[im 87/217]
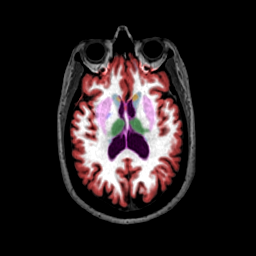
[im 130/217]
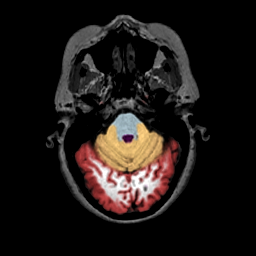
[im 173/217]
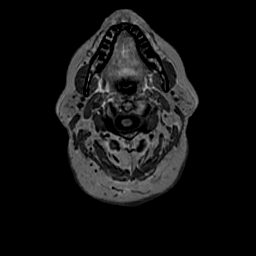

[Series 450: ADC · axial · 3.0mm · 0.94mm/px · 1 of 52 slices shown (1 of 2)]
[im 1/52]
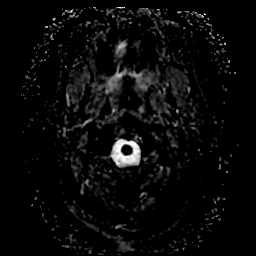

[Series 550: ADC · coronal · 4.0mm · 0.94mm/px · 1 of 40 slices shown (2 of 2)]
[im 1/40]
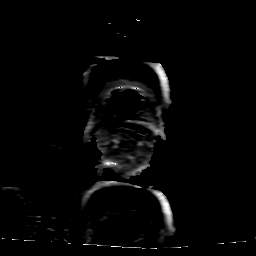

[31 of 48 positions shown; findings below may reference images not displayed]

FINDINGS: Brain: No acute infarction, hemorrhage, hydrocephalus, extra-axial
collection or mass lesion. Mild for age scattered T2/FLAIR
hyperintensities in the white matter, nonspecific but compatible
with chronic microvascular ischemic disease. Cerebral atrophy, as
detailed below.

Vascular: Small right vertebral artery flow void, probably related
to non dominant vertebral artery. Otherwise, major arterial flow
voids are maintained at the skull base.

Skull and upper cervical spine: Normal marrow signal.

Sinuses/Orbits: Clear sinuses.  Unremarkable orbits.

Other: No mastoid effusions.

NeuroQuant Findings:

Volumetric analysis of the brain was performed, with a fully
detailed report in [HOSPITAL] PACS. Briefly, the comparison with age and
gender matched reference reveals the following normative
percentiles:

*Whole brain volume: 5
*Superior lateral ventricles: 86
*Thalamus: 29
*Cortical gray matter: 11
*Cerebral white matter: 17
*Third ventricle: 22
*Hippocampi: 12
*Inferior lateral ventricles: 52
IMPRESSION: 1. No evidence of acute intracranial abnormality.
2. NeuroQuant volumetric analysis of the brain is detailed above
(with full report available on [HOSPITAL] PACS).

## 2023-02-16 DIAGNOSIS — F39 Unspecified mood [affective] disorder: Secondary | ICD-10-CM | POA: Diagnosis not present

## 2023-02-16 DIAGNOSIS — F4312 Post-traumatic stress disorder, chronic: Secondary | ICD-10-CM | POA: Diagnosis not present

## 2023-02-16 DIAGNOSIS — F900 Attention-deficit hyperactivity disorder, predominantly inattentive type: Secondary | ICD-10-CM | POA: Diagnosis not present

## 2023-02-16 DIAGNOSIS — F410 Panic disorder [episodic paroxysmal anxiety] without agoraphobia: Secondary | ICD-10-CM | POA: Diagnosis not present

## 2023-02-19 ENCOUNTER — Other Ambulatory Visit: Payer: Self-pay

## 2023-02-19 NOTE — Patient Outreach (Signed)
  Care Management   Visit Note  02/19/2023 Name: Melissa Arroyo MRN: [id number] DOB: 31-May-1950  Subjective: Melissa Arroyo is a 73 y.o. year old female who is a primary care patient of Ostwalt, Janna, PA-C. The Care Management team was consulted for assistance.      Engaged with Melissa Arroyo via telephone.  Assessment:  Review of patient past medical history, allergies, medications, health status, including review of consultants reports, laboratory and other test data, was performed as part of  evaluation and provision of care management services.    Outpatient Encounter Medications as of 02/19/2023  Medication Sig Note   atorvastatin  (LIPITOR) 40 MG tablet TAKE 1 TABLET(40 MG) BY MOUTH DAILY    b complex vitamins capsule Take 1 capsule by mouth daily.    DULoxetine (CYMBALTA) 60 MG capsule Take 60 mg by mouth daily.    estradiol  (ESTRACE ) 0.1 MG/GM vaginal cream Place 1 Applicatorful vaginally at bedtime.    eszopiclone (LUNESTA) 1 MG TABS tablet Take 1 mg by mouth at bedtime as needed. 02/19/2023: Reports dose was increased to 2mg    Alcohol  Swabs (ALCOHOL  PREP) PADS Use 1 pad prior to checking blood sugar to clean skin    blood glucose meter kit and supplies Dispense based on patient and insurance preference. Check glucose once daily. (FOR ICD-10 E11.29).Dispense based on patient and insurance preference. Check glucose once daily. (FOR ICD-10 E11.29).    Blood Glucose Monitoring Suppl DEVI 1 each by Does not apply route daily. May substitute to any manufacturer covered by patient's insurance.    cyclobenzaprine  (FLEXERIL ) 5 MG tablet Take 1 tablet (5 mg total) by mouth 3 (three) times daily as needed for muscle spasms (back pain, start with PM dosing). (Patient not taking: Reported on 02/19/2023)    FLUoxetine  (PROZAC ) 20 MG capsule Take 3 capsules (60 mg total) by mouth daily. (Patient taking differently: Take 20 mg by mouth daily. Takes in combination with 40 mg strength for total of 60 mg each  morning) 02/19/2023: Reports taking a 20mg  and 40 mg tablet for total of 60 mg   FLUoxetine  (PROZAC ) 40 MG capsule Take 40 mg by mouth daily. (Takes in combination with 20 mg strength for total of 60 mg each morning)    Glucosamine-Chondroitin (MOVE FREE PO) Take 1 tablet by mouth daily. (Patient not taking: Reported on 02/19/2023)    ibuprofen (ADVIL,MOTRIN) 200 MG tablet Take 400 mg by mouth at bedtime. As needed    lisinopril  (ZESTRIL ) 2.5 MG tablet Take 1 tablet (2.5 mg total) by mouth daily.    metFORMIN  (GLUCOPHAGE -XR) 500 MG 24 hr tablet Take 2 tablets (1,000 mg total) by mouth 2 (two) times daily with a meal.    Multiple Vitamins-Minerals (CENTRUM SILVER  PO) Take 1 tablet by mouth daily.    OLANZapine  (ZYPREXA ) 5 MG tablet Take 2.5 mg by mouth at bedtime. 1-2 tablet nightly at bedtime 02/19/2023: Reports taking full 5mg  dose nightly   pioglitazone  (ACTOS ) 15 MG tablet Take 1 tablet (15 mg total) by mouth daily.    propranolol  (INDERAL ) 10 MG tablet Take 0.5 tablets (5 mg total) by mouth 2 (two) times daily.    No facility-administered encounter medications on file as of 02/19/2023.

## 2023-03-30 DIAGNOSIS — F900 Attention-deficit hyperactivity disorder, predominantly inattentive type: Secondary | ICD-10-CM | POA: Diagnosis not present

## 2023-03-30 DIAGNOSIS — F4312 Post-traumatic stress disorder, chronic: Secondary | ICD-10-CM | POA: Diagnosis not present

## 2023-03-30 DIAGNOSIS — F39 Unspecified mood [affective] disorder: Secondary | ICD-10-CM | POA: Diagnosis not present

## 2023-03-30 DIAGNOSIS — F410 Panic disorder [episodic paroxysmal anxiety] without agoraphobia: Secondary | ICD-10-CM | POA: Diagnosis not present

## 2023-06-10 DIAGNOSIS — F900 Attention-deficit hyperactivity disorder, predominantly inattentive type: Secondary | ICD-10-CM | POA: Diagnosis not present

## 2023-06-10 DIAGNOSIS — F4312 Post-traumatic stress disorder, chronic: Secondary | ICD-10-CM | POA: Diagnosis not present

## 2023-06-10 DIAGNOSIS — F39 Unspecified mood [affective] disorder: Secondary | ICD-10-CM | POA: Diagnosis not present

## 2023-06-10 DIAGNOSIS — F410 Panic disorder [episodic paroxysmal anxiety] without agoraphobia: Secondary | ICD-10-CM | POA: Diagnosis not present

## 2023-06-24 DIAGNOSIS — E1142 Type 2 diabetes mellitus with diabetic polyneuropathy: Secondary | ICD-10-CM | POA: Diagnosis not present

## 2023-06-24 DIAGNOSIS — E782 Mixed hyperlipidemia: Secondary | ICD-10-CM | POA: Diagnosis not present

## 2023-06-24 DIAGNOSIS — E1159 Type 2 diabetes mellitus with other circulatory complications: Secondary | ICD-10-CM | POA: Diagnosis not present

## 2023-06-24 DIAGNOSIS — I25118 Atherosclerotic heart disease of native coronary artery with other forms of angina pectoris: Secondary | ICD-10-CM | POA: Diagnosis not present

## 2023-06-24 DIAGNOSIS — Z7984 Long term (current) use of oral hypoglycemic drugs: Secondary | ICD-10-CM | POA: Diagnosis not present

## 2023-06-24 DIAGNOSIS — B351 Tinea unguium: Secondary | ICD-10-CM | POA: Diagnosis not present

## 2023-06-24 DIAGNOSIS — E039 Hypothyroidism, unspecified: Secondary | ICD-10-CM | POA: Diagnosis not present

## 2023-06-24 DIAGNOSIS — E1169 Type 2 diabetes mellitus with other specified complication: Secondary | ICD-10-CM | POA: Diagnosis not present

## 2023-06-24 DIAGNOSIS — I152 Hypertension secondary to endocrine disorders: Secondary | ICD-10-CM | POA: Diagnosis not present

## 2023-06-24 DIAGNOSIS — F3341 Major depressive disorder, recurrent, in partial remission: Secondary | ICD-10-CM | POA: Diagnosis not present

## 2023-06-24 DIAGNOSIS — I10 Essential (primary) hypertension: Secondary | ICD-10-CM | POA: Diagnosis not present

## 2023-06-24 DIAGNOSIS — Z1339 Encounter for screening examination for other mental health and behavioral disorders: Secondary | ICD-10-CM | POA: Diagnosis not present

## 2023-06-29 ENCOUNTER — Telehealth: Payer: Self-pay | Admitting: Physician Assistant

## 2023-06-29 ENCOUNTER — Other Ambulatory Visit: Payer: Self-pay | Admitting: Physician Assistant

## 2023-06-29 DIAGNOSIS — E1129 Type 2 diabetes mellitus with other diabetic kidney complication: Secondary | ICD-10-CM

## 2023-06-29 MED ORDER — PIOGLITAZONE HCL 15 MG PO TABS: 15.0000 mg | ORAL_TABLET | Freq: Every day | ORAL | 0 refills | Status: DC

## 2023-06-29 NOTE — Telephone Encounter (Signed)
 [facility] faxed refill request for the following medications:   pioglitazone  (ACTOS ) 15 MG tablet     Please advise.

## 2023-07-13 DIAGNOSIS — F410 Panic disorder [episodic paroxysmal anxiety] without agoraphobia: Secondary | ICD-10-CM | POA: Diagnosis not present

## 2023-07-13 DIAGNOSIS — F4312 Post-traumatic stress disorder, chronic: Secondary | ICD-10-CM | POA: Diagnosis not present

## 2023-07-13 DIAGNOSIS — F39 Unspecified mood [affective] disorder: Secondary | ICD-10-CM | POA: Diagnosis not present

## 2023-07-13 DIAGNOSIS — F900 Attention-deficit hyperactivity disorder, predominantly inattentive type: Secondary | ICD-10-CM | POA: Diagnosis not present

## 2023-07-23 DIAGNOSIS — F39 Unspecified mood [affective] disorder: Secondary | ICD-10-CM | POA: Diagnosis not present

## 2023-07-23 DIAGNOSIS — F900 Attention-deficit hyperactivity disorder, predominantly inattentive type: Secondary | ICD-10-CM | POA: Diagnosis not present

## 2023-07-23 DIAGNOSIS — F410 Panic disorder [episodic paroxysmal anxiety] without agoraphobia: Secondary | ICD-10-CM | POA: Diagnosis not present

## 2023-07-23 DIAGNOSIS — F4312 Post-traumatic stress disorder, chronic: Secondary | ICD-10-CM | POA: Diagnosis not present

## 2023-07-30 ENCOUNTER — Other Ambulatory Visit: Payer: Self-pay

## 2023-07-30 ENCOUNTER — Emergency Department
Admission: EM | Admit: 2023-07-30 | Discharge: 2023-07-30 | Disposition: A | Discharge status: Home / Self Care | Attending: Emergency Medicine | Admitting: Emergency Medicine

## 2023-07-30 ENCOUNTER — Ambulatory Visit: Payer: Self-pay

## 2023-07-30 ENCOUNTER — Emergency Department

## 2023-07-30 DIAGNOSIS — G479 Sleep disorder, unspecified: Secondary | ICD-10-CM | POA: Diagnosis not present

## 2023-07-30 DIAGNOSIS — R531 Weakness: Secondary | ICD-10-CM | POA: Diagnosis not present

## 2023-07-30 DIAGNOSIS — R29898 Other symptoms and signs involving the musculoskeletal system: Secondary | ICD-10-CM | POA: Diagnosis not present

## 2023-07-30 DIAGNOSIS — G4700 Insomnia, unspecified: Secondary | ICD-10-CM | POA: Diagnosis not present

## 2023-07-30 LAB — COMPREHENSIVE METABOLIC PANEL WITH GFR
ALT: 22 U/L (ref 0–44)
AST: 23 U/L (ref 15–41)
Albumin: 4 g/dL (ref 3.5–5.0)
Alkaline Phosphatase: 78 U/L (ref 38–126)
Anion gap: 9 (ref 5–15)
BUN: 18 mg/dL (ref 8–23)
CO2: 26 mmol/L (ref 22–32)
Calcium: 9.8 mg/dL (ref 8.9–10.3)
Chloride: 101 mmol/L (ref 98–111)
Creatinine, Ser: 0.73 mg/dL (ref 0.44–1.00)
GFR, Estimated: >60 mL/min (ref >60)
Glucose, Bld: 219 mg/dL — ABNORMAL HIGH (ref 70–99)
Potassium: 4 mmol/L (ref 3.5–5.1)
Sodium: 136 mmol/L (ref 135–145)
Total Bilirubin: 0.7 mg/dL (ref 0.0–1.2)
Total Protein: 7.5 g/dL (ref 6.5–8.1)

## 2023-07-30 LAB — CBC
HCT: 36.2 % (ref 36.0–46.0)
Hemoglobin: 12 g/dL (ref 12.0–15.0)
MCH: 31.3 pg (ref 26.0–34.0)
MCHC: 33.1 g/dL (ref 30.0–36.0)
MCV: 94.3 fL (ref 80.0–100.0)
Platelets: 295 K/uL (ref 150–400)
RBC: 3.84 MIL/uL — ABNORMAL LOW (ref 3.87–5.11)
RDW: 13.6 % (ref 11.5–15.5)
WBC: 6.6 K/uL (ref 4.0–10.5)
nRBC: 0 % (ref 0.0–0.2)

## 2023-07-30 LAB — TSH: TSH: 2.487 u[IU]/mL (ref 0.350–4.500)

## 2023-07-30 LAB — TROPONIN I (HIGH SENSITIVITY): Troponin I (High Sensitivity): 3 ng/L (ref <18)

## 2023-07-30 LAB — T4, FREE: Free T4: 0.8 ng/dL (ref 0.61–1.12)

## 2023-07-30 NOTE — Telephone Encounter (Signed)
 FYI Only or Action Required?: FYI only for provider.  Patient was last seen in primary care on 11/26/2022 by Normie Becton, FNP. Called Nurse Triage reporting Neurologic Problem. Symptoms began 6 weeks ago. Interventions attempted: Other: patient's psychiatrist had lowered some of her medications but wanted her to get checked out by a provider for new symptoms. Symptoms are: gradually worsening.  Triage Disposition: Go to ED or PCP/Alternative with Approval  Patient/caregiver understands and will follow disposition?: Yes                     Copied from CRM [id number]. Topic: Clinical - Red Word Triage >> Jul 30, 2023  8:22 AM Melissa Arroyo wrote: Red Word that prompted transfer to Nurse Triage: was wondering if she had a mini stroke. She's been Very fatigue , also had unbalanced walking and high blood pressure. Reason for Disposition  Patient sounds very sick or weak to the triager  Answer Assessment - Initial Assessment Questions 1. SYMPTOM: What is the main symptom you are concerned about? (e.g., weakness, numbness)     Off balance, very tired 2. ONSET: When did this start? (minutes, hours, days; while sleeping)     6 weeks ago 3. LAST NORMAL: When was the last time you (the patient) were normal (no symptoms)?     Over 6 weeks ago 4. PATTERN Does this come and go, or has it been constant since it started?  Is it present now?     Symptoms have been constant for the past 6 weeks 5. CARDIAC SYMPTOMS: Have you had any of the following symptoms: chest pain, difficulty breathing, palpitations?     N/A 6. NEUROLOGIC SYMPTOMS: Have you had any of the following symptoms: headache, dizziness, vision loss, double vision, changes in speech, unsteady on your feet?     Off balance 7. OTHER SYMPTOMS: Do you have any other symptoms?     Very tired, blood pressure fluctuations, three panic attacks in the past two weeks   Patient states that for the past 6 weeks her  blood pressure has been fluctuating up and down and she's been having symptoms with her balance and constantly being tired She also takes antidepressants and medications to help her sleep. Recent lowering of these medications by her psychiatrist Dr Kapur---Dr Bradford Cadet advised patient to also get checked out to rule out any other causes and questioned if the patient might have had a mini stroke She states that she is very tired during the day and has had 3 major panic attacks in the past two weeks. Patient states the the right side of her would be sensitive after those panic attacks for a few days. She states she still feels off balance She is advised that going to the Emergency Room now is advised to have these symptoms evaluated. She states she will get her husband to take her to the Emergency Room. She did not want to take an ambulance to the hospital at this time. She is advised that if anything worsens prior to getting to the Emergency Room to call 911 for an ambulance to take her to the ER. Patient verbalized understanding.  Protocols used: Neurologic Deficit-A-AH

## 2023-07-30 NOTE — ED Provider Notes (Signed)
 [facility] Provider Note    Event Date/Time   First MD Initiated Contact with Patient 07/30/23 1113     (approximate)   History   Weakness   HPI  Melissa Arroyo is a 73 y.o. female with depression who comes in with feeling off for the past 6 weeks patient's was normal at 7 PM woke up around 3 AM with generalized weakness per triage note however according to patient she did not have an acute change this morning.  This has been an ongoing issue for the past 6 weeks, maybe more.  They been trying to come down and adjust some of her psychiatric medications.  She reports some difficulty with sleeping, difficulties focusing, feeling foggy, having to take multiple naps during the day, feeling unbalanced with walking.  She also reports having some panic attacks with the last panic attack a few days ago.   She denies any SI.  No active plan to hurt herself.  She reports that her psychiatrist told her to follow-up with her primary care doctor to ensure there is nothing else medically going on and when she called to make an appointment with her PCP they told her to go to the emergency room.   Physical Exam   Triage Vital Signs: ED Triage Vitals  Encounter Vitals Group     BP 07/30/23 1020 (!) 163/83     Girls Systolic BP Percentile --      Girls Diastolic BP Percentile --      Boys Systolic BP Percentile --      Boys Diastolic BP Percentile --      Pulse Rate 07/30/23 1020 91     Resp 07/30/23 1020 20     Temp 07/30/23 1020 98.4 F (36.9 C)     Temp Source 07/30/23 1020 Oral     SpO2 07/30/23 1020 98 %     Weight 07/30/23 1021 166 lb (75.3 kg)     Height 07/30/23 1021 5' 5 (1.651 m)     Head Circumference --      Peak Flow --      Pain Score 07/30/23 1021 0     Pain Loc --      Pain Education --      Exclude from Growth Chart --     Most recent vital signs: Vitals:   07/30/23 1020  BP: (!) 163/83  Pulse: 91  Resp: 20  Temp: 98.4 F (36.9 C)  SpO2:  98%     General: Awake, no distress.  CV:  Good peripheral perfusion.  Resp:  Normal effort.  Abd:  No distention.  Soft and nontender Other:  Cranial nerves II through XII are intact.  Equal strength in arms and legs.  Finger-nose intact.  Heel-to-shin intact.  She is \\ambulatory  without any ataxia.   ED Results / Procedures / Treatments   Labs (all labs ordered are listed, but only abnormal results are displayed) Labs Reviewed  COMPREHENSIVE METABOLIC PANEL WITH GFR - Abnormal; Notable for the following components:      Result Value   Glucose, Bld 219 (*)    All other components within normal limits  CBC - Abnormal; Notable for the following components:   RBC 3.84 (*)    All other components within normal limits  URINALYSIS, ROUTINE W REFLEX MICROSCOPIC  TSH  T4, FREE  CBG MONITORING, ED  TROPONIN I (HIGH SENSITIVITY)     EKG  My interpretation of EKG:  Normal  sinus rate of 75 without any ST elevation or T wave inversions, left anterior fascicular block  RADIOLOGY I have reviewed the CT head personally interpreted no evidence of intracranial hemorrhage   PROCEDURES:  Critical Care performed: No  Procedures   MEDICATIONS ORDERED IN ED: Medications - No data to display   IMPRESSION / MDM / ASSESSMENT AND PLAN / ED COURSE  I reviewed the triage vital signs and the nursing notes.   Patient's presentation is most consistent with acute presentation with potential threat to life or bodily function.   Patient comes in with concerns for multiple symptoms that been ongoing for some weeks now.  There was not any acute change that happened today.  Her neuroexam is reassuring I do not see any evidence of obvious stroke.  Will get CT imaging given this occurred a few weeks ago if there was a stroke we should be able to see it on the CT head, rule out any type of mass, bleeding.  I do suspect a lot of this could be related to her mental health.  We discussed her  reassuring CT imaging.  We discussed that MRI can be more sensitive for small strokes however her neurological exam at this time is very reassuring and this has been ongoing for at least 6 weeks per patient.  Patient felt comfortable following up outpatient with neurology but will continue to work with her psychiatrist to discuss medication changes.  Troponin was negative and symptoms have been ongoing for more than 3 hours.  CMP reassuring CBC reassuring.  Orthostatics negative  She denies any urinary symptoms so we will hold off on UA as seems less likely UTI and patient would like to be discharged home at this time.  We discussed her reassuring workup we again discussed the MRI but she can follow-up outpatient given this has been ongoing for some weeks and I suspect is more likely medication induced we discussed the preventative baby aspirin which she reports being on previously and not having any GI bleeding.  She understands to return if she develops acute change in neurological symptoms but at this time she is felt comfortable with following up outpatient for MRI.   The patient is on the cardiac monitor to evaluate for evidence of arrhythmia and/or significant heart rate changes.      FINAL CLINICAL IMPRESSION(S) / ED DIAGNOSES   Final diagnoses:  Weakness  Difficulty sleeping     Rx / DC Orders   ED Discharge Orders     None        Note:  This document was prepared using Dragon voice recognition software and may include unintentional dictation errors.   Lubertha Rush, MD 07/30/23 1328

## 2023-07-30 NOTE — Telephone Encounter (Signed)
Patient at the ED

## 2023-07-30 NOTE — Discharge Instructions (Addendum)
 You should call neurology and make a follow-up appointment outpatient.  State that you need an ER follow-up or discuss with PCP ordering a MRI outpatient to rule out an old stroke.  If you develop an acute change in symptoms and return to the ER for evaluation. Take aspirin 81mg  daily to prevent stroke until you have been evaluated by PCP or neurology.   Discuss your medications further with your primary care doctor or your psychiatrist return to the ER for worsening symptoms or any other concerns

## 2023-07-30 NOTE — ED Triage Notes (Addendum)
 Pt to ED via POV from home. Pt reports has been feeling off x6 wks that started with BP issues. Pt reports woke up this morning feeling foggy headed, bilateral hand tingling and generalized weakness. Pt went to bed at 7pm and woke up at 3am. Pt also reports increased panic attacks. Pt states on antidepressants and reports PCP are trying to adjust medications. Pt states works from home and has been wanting to sleep more.

## 2023-08-10 ENCOUNTER — Other Ambulatory Visit: Payer: Self-pay

## 2023-08-10 ENCOUNTER — Telehealth: Payer: Self-pay | Admitting: Physician Assistant

## 2023-08-10 DIAGNOSIS — F4312 Post-traumatic stress disorder, chronic: Secondary | ICD-10-CM | POA: Diagnosis not present

## 2023-08-10 DIAGNOSIS — F39 Unspecified mood [affective] disorder: Secondary | ICD-10-CM | POA: Diagnosis not present

## 2023-08-10 DIAGNOSIS — F900 Attention-deficit hyperactivity disorder, predominantly inattentive type: Secondary | ICD-10-CM | POA: Diagnosis not present

## 2023-08-10 DIAGNOSIS — I152 Hypertension secondary to endocrine disorders: Secondary | ICD-10-CM

## 2023-08-10 DIAGNOSIS — F410 Panic disorder [episodic paroxysmal anxiety] without agoraphobia: Secondary | ICD-10-CM | POA: Diagnosis not present

## 2023-08-10 NOTE — Telephone Encounter (Signed)
 [facility] faxed refill request for the following medications:   lisinopril  (ZESTRIL ) 10 MG tablet   Dose not on current med list  Please advise.

## 2023-08-11 DIAGNOSIS — Z1339 Encounter for screening examination for other mental health and behavioral disorders: Secondary | ICD-10-CM | POA: Diagnosis not present

## 2023-08-11 DIAGNOSIS — Z7984 Long term (current) use of oral hypoglycemic drugs: Secondary | ICD-10-CM | POA: Diagnosis not present

## 2023-08-11 DIAGNOSIS — N951 Menopausal and female climacteric states: Secondary | ICD-10-CM | POA: Diagnosis not present

## 2023-08-11 DIAGNOSIS — Z6829 Body mass index (BMI) 29.0-29.9, adult: Secondary | ICD-10-CM | POA: Diagnosis not present

## 2023-08-11 DIAGNOSIS — E1159 Type 2 diabetes mellitus with other circulatory complications: Secondary | ICD-10-CM | POA: Diagnosis not present

## 2023-08-11 DIAGNOSIS — E1129 Type 2 diabetes mellitus with other diabetic kidney complication: Secondary | ICD-10-CM | POA: Diagnosis not present

## 2023-08-11 DIAGNOSIS — Z1331 Encounter for screening for depression: Secondary | ICD-10-CM | POA: Diagnosis not present

## 2023-08-11 DIAGNOSIS — Z1389 Encounter for screening for other disorder: Secondary | ICD-10-CM | POA: Diagnosis not present

## 2023-08-11 DIAGNOSIS — E46 Unspecified protein-calorie malnutrition: Secondary | ICD-10-CM | POA: Diagnosis not present

## 2023-08-11 DIAGNOSIS — Z1383 Encounter for screening for respiratory disorder NEC: Secondary | ICD-10-CM | POA: Diagnosis not present

## 2023-08-12 ENCOUNTER — Other Ambulatory Visit: Payer: Self-pay

## 2023-08-12 MED ORDER — LISINOPRIL 2.5 MG PO TABS: 2.5000 mg | ORAL_TABLET | Freq: Every day | ORAL | Status: DC

## 2023-09-03 DIAGNOSIS — F39 Unspecified mood [affective] disorder: Secondary | ICD-10-CM | POA: Diagnosis not present

## 2023-09-03 DIAGNOSIS — F4312 Post-traumatic stress disorder, chronic: Secondary | ICD-10-CM | POA: Diagnosis not present

## 2023-09-03 DIAGNOSIS — F410 Panic disorder [episodic paroxysmal anxiety] without agoraphobia: Secondary | ICD-10-CM | POA: Diagnosis not present

## 2023-09-03 DIAGNOSIS — F900 Attention-deficit hyperactivity disorder, predominantly inattentive type: Secondary | ICD-10-CM | POA: Diagnosis not present

## 2023-09-07 ENCOUNTER — Telehealth: Payer: Self-pay | Admitting: Physician Assistant

## 2023-09-07 ENCOUNTER — Other Ambulatory Visit: Payer: Self-pay | Admitting: Physician Assistant

## 2023-09-07 DIAGNOSIS — E1129 Type 2 diabetes mellitus with other diabetic kidney complication: Secondary | ICD-10-CM

## 2023-09-07 NOTE — Telephone Encounter (Signed)
 [facility] faxed refill request for the following medications:  propranolol  (INDERAL ) 10 MG tablet    Please advise

## 2023-09-09 NOTE — Telephone Encounter (Signed)
 Patient has been made aware regarding needs to schedule an appt for any future refills as her last PCP no longer works here. Pt verbalized understanding.

## 2023-10-12 ENCOUNTER — Other Ambulatory Visit: Payer: Self-pay | Admitting: Physician Assistant

## 2023-10-12 DIAGNOSIS — E1129 Type 2 diabetes mellitus with other diabetic kidney complication: Secondary | ICD-10-CM

## 2023-10-27 DIAGNOSIS — E1159 Type 2 diabetes mellitus with other circulatory complications: Secondary | ICD-10-CM | POA: Diagnosis not present

## 2023-10-27 DIAGNOSIS — R6889 Other general symptoms and signs: Secondary | ICD-10-CM | POA: Diagnosis not present

## 2023-10-27 LAB — LAB REPORT - SCANNED
A1c: 6.8
Albumin, Urine POC: 3
Albumin/Creatinine Ratio, Urine, POC: 3
Creatinine, POC: 101.6 mg/dL
EGFR: 75
TSH: 2.67

## 2023-11-04 DIAGNOSIS — F410 Panic disorder [episodic paroxysmal anxiety] without agoraphobia: Secondary | ICD-10-CM | POA: Diagnosis not present

## 2023-11-04 DIAGNOSIS — F900 Attention-deficit hyperactivity disorder, predominantly inattentive type: Secondary | ICD-10-CM | POA: Diagnosis not present

## 2023-11-04 DIAGNOSIS — F4312 Post-traumatic stress disorder, chronic: Secondary | ICD-10-CM | POA: Diagnosis not present

## 2023-11-04 DIAGNOSIS — F39 Unspecified mood [affective] disorder: Secondary | ICD-10-CM | POA: Diagnosis not present

## 2023-11-05 ENCOUNTER — Ambulatory Visit: Payer: Self-pay

## 2023-11-05 NOTE — Telephone Encounter (Signed)
 FYI Only or Action Required?: FYI only for provider.  Patient was last seen in primary care on 11/26/2022 by Emilio Kelly DASEN, FNP.  Called Nurse Triage reporting Cough.  Symptoms began about a month ago.  Interventions attempted: OTC medications: benadryl, mucinex, claritin.  Symptoms are: unchanged.  Triage Disposition: See Physician Within 24 Hours  Patient/caregiver understands and will follow disposition?: Yes  Reason for Disposition  Change in color of sputum (e.g., from white to yellow-green sputum)  Answer Assessment - Initial Assessment Questions Patient reports cough and fatigue since August. Also mentions:  Her psychiatrist thinks oxycodone may be giving patient brain fog. Discontinued in June, states brain fog was improving after discontinuing. Is working with psych for anxiety. States during recent trip, brain fog and anxiety returned and improved with rest.  Patient believes she developed bronchitis and was seen by provider last week. States continues to cough up phlem and is fatigued. Patient is taking mucinex, claritin and benadryl.   Asking if she could have long covid. Scheduled for OV.   1. ONSET: When did the cough begin?      09/30/23 fatigue, cough 10/09/23  2. SPUTUM: Describe the color of your sputum (e.g., none, dry cough; clear, white, yellow, green)     Clear to light yellow  3. HEMOPTYSIS: Are you coughing up any blood? If so ask: How much? (e.g., flecks, streaks, tablespoons, etc.)     Denies  4. DIFFICULTY BREATHING: Are you having difficulty breathing? If Yes, ask: How bad is it? (e.g., mild, moderate, severe)      Previously  5. FEVER: Do you have a fever? If Yes, ask: What is your temperature, how was it measured, and when did it start?     States had a fever that resolved  Protocols used: Cough - Chronic-A-AH Copied from CRM [id number]. Topic: Clinical - Red Word Triage >> Nov 05, 2023  1:33 PM Avram MATSU wrote: Red Word that  prompted transfer to Nurse Triage: brain frog/fatigue/confusing coughing up mucus

## 2023-11-06 ENCOUNTER — Ambulatory Visit: Admitting: Family Medicine

## 2023-11-06 ENCOUNTER — Telehealth: Payer: Self-pay | Admitting: Physician Assistant

## 2023-11-06 ENCOUNTER — Ambulatory Visit
Admission: RE | Admit: 2023-11-06 | Discharge: 2023-11-06 | Disposition: A | Discharge status: Home / Self Care | Attending: Family Medicine | Admitting: Family Medicine

## 2023-11-06 ENCOUNTER — Encounter: Payer: Self-pay | Admitting: Family Medicine

## 2023-11-06 ENCOUNTER — Ambulatory Visit
Admission: RE | Admit: 2023-11-06 | Discharge: 2023-11-06 | Disposition: A | Discharge status: Home / Self Care | Source: Ambulatory Visit | Attending: Family Medicine | Admitting: Family Medicine

## 2023-11-06 VITALS — BP 102/77 | HR 88 | Temp 98.8°F | Resp 16 | Wt 166.0 lb

## 2023-11-06 DIAGNOSIS — J189 Pneumonia, unspecified organism: Secondary | ICD-10-CM

## 2023-11-06 DIAGNOSIS — F339 Major depressive disorder, recurrent, unspecified: Secondary | ICD-10-CM

## 2023-11-06 DIAGNOSIS — F4310 Post-traumatic stress disorder, unspecified: Secondary | ICD-10-CM | POA: Diagnosis not present

## 2023-11-06 DIAGNOSIS — J988 Other specified respiratory disorders: Secondary | ICD-10-CM

## 2023-11-06 DIAGNOSIS — E1129 Type 2 diabetes mellitus with other diabetic kidney complication: Secondary | ICD-10-CM | POA: Diagnosis not present

## 2023-11-06 DIAGNOSIS — J984 Other disorders of lung: Secondary | ICD-10-CM

## 2023-11-06 DIAGNOSIS — R809 Proteinuria, unspecified: Secondary | ICD-10-CM | POA: Diagnosis not present

## 2023-11-06 DIAGNOSIS — F410 Panic disorder [episodic paroxysmal anxiety] without agoraphobia: Secondary | ICD-10-CM

## 2023-11-06 DIAGNOSIS — Z981 Arthrodesis status: Secondary | ICD-10-CM | POA: Diagnosis not present

## 2023-11-06 DIAGNOSIS — R942 Abnormal results of pulmonary function studies: Secondary | ICD-10-CM | POA: Diagnosis not present

## 2023-11-06 MED ORDER — CEFPODOXIME PROXETIL 200 MG PO TABS: 200.0000 mg | ORAL_TABLET | Freq: Two times a day (BID) | ORAL | 0 refills | Status: DC

## 2023-11-06 MED ORDER — AZITHROMYCIN 250 MG PO TABS: ORAL_TABLET | ORAL | 0 refills | Status: AC

## 2023-11-06 MED ORDER — ATORVASTATIN CALCIUM 40 MG PO TABS: ORAL_TABLET | ORAL | 0 refills | Status: DC

## 2023-11-06 NOTE — Telephone Encounter (Signed)
[facility] faxed refill request for the following medications:  atorvastatin (LIPITOR) 40 MG tablet   Please advise.  

## 2023-11-06 NOTE — Progress Notes (Signed)
 Established patient visit   Patient: Melissa Arroyo   DOB: 1950-05-24   73 y.o. Female  MRN: [id number] Visit Date: 11/06/2023  Today's healthcare provider: LAURAINE LOISE BUOY, DO   Chief Complaint  Patient presents with   Cough    Frequency: since September 2nd.  Symptoms: fatigue,sob,cough   Fatigue    Reports feeling very fatigue after having panic attacks   Panic Attack   Subjective    Cough Associated symptoms include shortness of breath and wheezing. Pertinent negatives include no chest pain, chills or fever.    Melissa Arroyo is a 73 year old female who presents with a persistent cough and fatigue.  She has been experiencing a productive cough since September 2nd, accompanied by fatigue, shortness of breath, and brain fog. The cough was severe until yesterday but has slightly improved today. She reports a low-grade fever of 99.70F. No change in appetite, but she reports a significant decrease in activity level, often staying in bed.  She feels weak and tired, often staying in bed due to lack of energy. She has a history of panic attacks, which leave her exhausted, and experiences shortness of breath, which she associates with both her cough and panic attacks. She reports shortness of breath. No nausea, vomiting, abdominal pain, diarrhea, or constipation.  Her past medical history includes a heart attack in 2009. She was previously on olanzapine , which was discontinued at the end of June. She experienced increased energy and productivity from June 5th to June 19th after stopping olanzapine . She is currently taking 40 mg of fluoxetine . She has lost approximately 52 pounds, leading to the discontinuation of lisinopril  due to low blood pressure. Her recent blood glucose level was 111 mg/dL, taken 2-3 hours postprandial, with a previous level of 219 mg/dL in June.  She has a history of seeing a pulmonary specialist for a pulmonary function test in 2018 but did not follow up on  the tests conducted at that time. She had COVID-19 in 2020 and wonders if her current symptoms could be related to long-term effects of the virus. She wants to return to work but feels unable to stand for eight hours due to her current condition.  She has been in talk therapy for 30 years.  She has a history of a rash with penicillin but tolerates cephalosporins. She denied a history of COPD and was unsure about asthma, initially thinking her symptoms might be related to it. No leg swelling.      Medications: Outpatient Medications Prior to Visit  Medication Sig Note   b complex vitamins capsule Take 1 capsule by mouth daily.    DULoxetine (CYMBALTA) 60 MG capsule Take 60 mg by mouth daily.    estradiol  (ESTRACE ) 0.1 MG/GM vaginal cream Place 1 Applicatorful vaginally at bedtime.    eszopiclone (LUNESTA) 1 MG TABS tablet Take 1 mg by mouth at bedtime as needed.    FLUoxetine  (PROZAC ) 40 MG capsule Take 40 mg by mouth daily. (Takes in combination with 20 mg strength for total of 60 mg each morning)    ibuprofen (ADVIL,MOTRIN) 200 MG tablet Take 400 mg by mouth at bedtime. As needed    metFORMIN  (GLUCOPHAGE -XR) 500 MG 24 hr tablet Take 2 tablets (1,000 mg total) by mouth 2 (two) times daily with a meal.    pioglitazone  (ACTOS ) 15 MG tablet TAKE 1 TABLET(15 MG) BY MOUTH DAILY    propranolol  (INDERAL ) 10 MG tablet Take 0.5 tablets (5 mg  total) by mouth 2 (two) times daily.    [DISCONTINUED] atorvastatin  (LIPITOR) 40 MG tablet TAKE 1 TABLET(40 MG) BY MOUTH DAILY    [DISCONTINUED] Alcohol  Swabs (ALCOHOL  PREP) PADS Use 1 pad prior to checking blood sugar to clean skin    [DISCONTINUED] blood glucose meter kit and supplies Dispense based on patient and insurance preference. Check glucose once daily. (FOR ICD-10 E11.29).Dispense based on patient and insurance preference. Check glucose once daily. (FOR ICD-10 E11.29).    [DISCONTINUED] Blood Glucose Monitoring Suppl DEVI 1 each by Does not apply route  daily. May substitute to any manufacturer covered by patient's insurance.    [DISCONTINUED] cyclobenzaprine  (FLEXERIL ) 5 MG tablet Take 1 tablet (5 mg total) by mouth 3 (three) times daily as needed for muscle spasms (back pain, start with PM dosing). (Patient not taking: Reported on 02/19/2023)    [DISCONTINUED] FLUoxetine  (PROZAC ) 20 MG capsule Take 3 capsules (60 mg total) by mouth daily. (Patient taking differently: Take 20 mg by mouth daily. Takes in combination with 40 mg strength for total of 60 mg each morning) 11/06/2023: previous   [DISCONTINUED] Glucosamine-Chondroitin (MOVE FREE PO) Take 1 tablet by mouth daily. (Patient not taking: Reported on 02/19/2023)    [DISCONTINUED] lisinopril  (ZESTRIL ) 2.5 MG tablet Take 1 tablet (2.5 mg total) by mouth daily. 11/06/2023: Low BPs s/p weight loss   [DISCONTINUED] Multiple Vitamins-Minerals (CENTRUM SILVER  PO) Take 1 tablet by mouth daily.    [DISCONTINUED] OLANZapine  (ZYPREXA ) 5 MG tablet Take 2.5 mg by mouth at bedtime. 1-2 tablet nightly at bedtime    No facility-administered medications prior to visit.    Review of Systems  Constitutional:  Positive for fatigue. Negative for activity change, appetite change, chills and fever.  Respiratory:  Positive for cough, shortness of breath and wheezing.   Cardiovascular:  Positive for palpitations (Particularly when anxious). Negative for chest pain.  Psychiatric/Behavioral:  The patient is nervous/anxious (panic attacks).         Objective    BP 102/77 (BP Location: Right Arm, Patient Position: Sitting, Cuff Size: Large)   Pulse 88   Temp 98.8 F (37.1 C) (Oral)   Resp 16   Wt 166 lb (75.3 kg)   SpO2 99%   BMI 27.62 kg/m     Physical Exam Vitals reviewed.  Constitutional:      General: She is not in acute distress.    Appearance: Normal appearance. She is well-developed. She is not diaphoretic.  HENT:     Head: Normocephalic and atraumatic.     Right Ear: Tympanic membrane, ear canal  and external ear normal.     Left Ear: Tympanic membrane, ear canal and external ear normal.     Nose: Nose normal.     Mouth/Throat:     Mouth: Mucous membranes are moist.     Pharynx: Oropharynx is clear. No oropharyngeal exudate.  Eyes:     General: No scleral icterus.    Conjunctiva/sclera: Conjunctivae normal.     Pupils: Pupils are equal, round, and reactive to light.  Cardiovascular:     Rate and Rhythm: Normal rate and regular rhythm.     Pulses: Normal pulses.     Heart sounds: Normal heart sounds. No murmur heard. Pulmonary:     Effort: Pulmonary effort is normal. No respiratory distress.     Breath sounds: Rales (throughout) present. No wheezing.  Musculoskeletal:     Cervical back: Neck supple.     Right lower leg: No edema.  Left lower leg: No edema.  Lymphadenopathy:     Cervical: No cervical adenopathy.  Skin:    General: Skin is warm and dry.     Findings: No rash.  Neurological:     Mental Status: She is alert.      No results found for any visits on 11/06/23.  Assessment & Plan    Community acquired pneumonia, unspecified laterality -     Cefpodoxime  Proxetil; Take 1 tablet (200 mg total) by mouth 2 (two) times daily.  Dispense: 14 tablet; Refill: 0 -     Azithromycin ; Take 2 tablets on day 1, then 1 tablet daily on days 2 through 5  Dispense: 6 tablet; Refill: 0 -     DG Chest 2 View; Future  Pulmonary scarring  Mild airflow obstruction on pulmonary function test  Depression, recurrent  Panic attack due to post traumatic stress disorder (PTSD)  Type 2 diabetes mellitus with microalbuminuria, without long-term current use of insulin (HCC)     Community-acquired pneumonia pneumonia, unspecified laterality Suspected pneumonia with productive cough, shortness of breath, and wheezing. Differential includes bronchitis versus long-term COVID-19 symptoms due to past infection. - Prescribed antibiotics as noted - Ordered chest x-ray at outpatient  imaging center. - Advised to monitor symptoms and seek emergency care if symptoms worsen or do not improve.  Pulmonary scarring, left lung base Pulmonary scarring in the left lung base noted on CTA chest obtained 04/04/2019.  Discuss further at next visit.  Mild airflow obstruction No formal diagnosis of asthma, but history of mild obstruction in past pulmonary tests. Trial of albuterol  suggested in the past.  Discuss further at next visit.  Depression, recurrent; panic attacks due to posttraumatic stress disorder (PTSD) Depressive disorder with panic attacks. Symptoms include fatigue, brain fog, and lack of energy. Current treatment includes fluoxetine  40 mg. Reports exhaustion following panic attacks.  Type 2 diabetes mellitus with microalbuminuria, without current long-term use of insulin Type 2 diabetes mellitus with recent blood glucose level of 111 mg/dL, measured 2-3 hours postprandial. Previous A1c was 219 mg/dL in June, indicating poor control. - Plan to perform A1c test along with routine lab work at next visit.    Return in about 1 week (around 11/13/2023) for Recheck PNA, Chronic f/u.      I discussed the assessment and treatment plan with the patient  The patient was provided an opportunity to ask questions and all were answered. The patient agreed with the plan and demonstrated an understanding of the instructions.   The patient was advised to call back or seek an in-person evaluation if the symptoms worsen or if the condition fails to improve as anticipated.    LAURAINE LOISE BUOY, DO  [facility] [phone] (phone) [phone] (fax)  [facility]

## 2023-11-06 NOTE — Telephone Encounter (Signed)
 Medication sent.

## 2023-11-11 ENCOUNTER — Ambulatory Visit: Payer: Self-pay

## 2023-11-11 NOTE — Telephone Encounter (Signed)
 FYI Only or Action Required?: Action required by provider: request for appointment.  Patient was last seen in primary care on 11/06/2023 by Donzella Lauraine SAILOR, DO.  Called Nurse Triage reporting Cough.  Symptoms began several weeks ago.  Interventions attempted: Prescription medications:  SABRA  Symptoms are: gradually improving. Pt. States she's a little better, but concerned about yellow mucus and continued SOB, wheezing.  Triage Disposition: See PCP When Office is Open (Within 3 Days)  Patient/caregiver understands and will follow disposition?: Yes      Copied from CRM [id number]. Topic: Clinical - Red Word Triage >> Nov 11, 2023 11:24 AM Montie POUR wrote: Red Word that prompted transfer to Nurse Triage:  Her symptoms are getting worse. She has shortness of breath; wheezing, coughing up yellow mucus, sweats with no fever She had an appointment on 11/06/23 an received an antibiotic and she is done with the antibiotic. Answer Assessment - Initial Assessment Questions 1. ONSET: When did the cough begin?      2-3 weeks 2. SEVERITY: How bad is the cough today?      better 3. SPUTUM: Describe the color of your sputum (e.g., none, dry cough; clear, white, yellow, green)     yellow 4. HEMOPTYSIS: Are you coughing up any blood? If Yes, ask: How much? (e.g., flecks, streaks, tablespoons, etc.)     no 5. DIFFICULTY BREATHING: Are you having difficulty breathing? If Yes, ask: How bad is it? (e.g., mild, moderate, severe)      mild 6. FEVER: Do you have a fever? If Yes, ask: What is your temperature, how was it measured, and when did it start?     no 7. CARDIAC HISTORY: Do you have any history of heart disease? (e.g., heart attack, congestive heart failure)      no 8. LUNG HISTORY: Do you have any history of lung disease?  (e.g., pulmonary embolus, asthma, emphysema)     yes 9. PE RISK FACTORS: Do you have a history of blood clots? (or: recent major surgery, recent  prolonged travel, bedridden)     no 10. OTHER SYMPTOMS: Do you have any other symptoms? (e.g., runny nose, wheezing, chest pain)       wheezing 11. PREGNANCY: Is there any chance you are pregnant? When was your last menstrual period?       no 12. TRAVEL: Have you traveled out of the country in the last month? (e.g., travel history, exposures)       no  Protocols used: Cough - Acute Productive-A-AH  Reason for Disposition  Cough has been present for > 3 weeks  Answer Assessment - Initial Assessment Questions 1. ONSET: When did the cough begin?      2-3 weeks 2. SEVERITY: How bad is the cough today?      better 3. SPUTUM: Describe the color of your sputum (e.g., none, dry cough; clear, white, yellow, green)     yellow 4. HEMOPTYSIS: Are you coughing up any blood? If Yes, ask: How much? (e.g., flecks, streaks, tablespoons, etc.)     no 5. DIFFICULTY BREATHING: Are you having difficulty breathing? If Yes, ask: How bad is it? (e.g., mild, moderate, severe)      mild 6. FEVER: Do you have a fever? If Yes, ask: What is your temperature, how was it measured, and when did it start?     no 7. CARDIAC HISTORY: Do you have any history of heart disease? (e.g., heart attack, congestive heart failure)      no  8. LUNG HISTORY: Do you have any history of lung disease?  (e.g., pulmonary embolus, asthma, emphysema)     yes 9. PE RISK FACTORS: Do you have a history of blood clots? (or: recent major surgery, recent prolonged travel, bedridden)     no 10. OTHER SYMPTOMS: Do you have any other symptoms? (e.g., runny nose, wheezing, chest pain)       wheezing 11. PREGNANCY: Is there any chance you are pregnant? When was your last menstrual period?       no 12. TRAVEL: Have you traveled out of the country in the last month? (e.g., travel history, exposures)       no  Protocols used: Cough - Acute Productive-A-AH

## 2023-11-12 ENCOUNTER — Encounter: Payer: Self-pay | Admitting: Physician Assistant

## 2023-11-12 ENCOUNTER — Ambulatory Visit (INDEPENDENT_AMBULATORY_CARE_PROVIDER_SITE_OTHER): Admitting: Physician Assistant

## 2023-11-12 VITALS — BP 113/66 | HR 86 | Resp 16 | Ht 65.0 in | Wt 164.0 lb

## 2023-11-12 DIAGNOSIS — R059 Cough, unspecified: Secondary | ICD-10-CM | POA: Insufficient documentation

## 2023-11-12 DIAGNOSIS — J309 Allergic rhinitis, unspecified: Secondary | ICD-10-CM | POA: Insufficient documentation

## 2023-11-12 DIAGNOSIS — F339 Major depressive disorder, recurrent, unspecified: Secondary | ICD-10-CM | POA: Diagnosis not present

## 2023-11-12 DIAGNOSIS — J3089 Other allergic rhinitis: Secondary | ICD-10-CM

## 2023-11-12 DIAGNOSIS — R058 Other specified cough: Secondary | ICD-10-CM

## 2023-11-12 DIAGNOSIS — R062 Wheezing: Secondary | ICD-10-CM | POA: Insufficient documentation

## 2023-11-12 DIAGNOSIS — F410 Panic disorder [episodic paroxysmal anxiety] without agoraphobia: Secondary | ICD-10-CM | POA: Diagnosis not present

## 2023-11-12 NOTE — Progress Notes (Addendum)
 Established patient visit  Patient: Melissa Arroyo   DOB: 09/15/1950   73 y.o. Female  MRN: [id number] Visit Date: 11/12/2023  Today's healthcare provider: Jolynn Spencer, PA-C   Chief Complaint  Patient presents with   Cough    F/u 9/26; Cough, wheezing, sob, head is foggy at times. X-ray was clear for Pneumonia, thought viral bronchitis. Medication given has helped a little has 1 dose left. Coughing yellow phlegm Ongoing 1 month  Referral to auto immune specialist or long covid/ referral to pulmonologist   Fatigue    Ongoing since aug 19th  Pt states fatigue and sob has been going on for 3 years. Had covid in 2020   Subjective     HPI     Cough    Additional comments: F/u 9/26; Cough, wheezing, sob, head is foggy at times. X-ray was clear for Pneumonia, thought viral bronchitis. Medication given has helped a little has 1 dose left. Coughing yellow phlegm Ongoing 1 month  Referral to auto immune specialist or long covid/ referral to pulmonologist        Fatigue    Additional comments: Ongoing since aug 19th  Pt states fatigue and sob has been going on for 3 years. Had covid in 2020      Last edited by Wilfred Hargis RAMAN, CMA on 11/12/2023 10:07 AM.       Discussed the use of AI scribe software for clinical note transcription with the patient, who gave verbal consent to proceed.  History of Present Illness Melissa Arroyo is a 73 year old female who presents with persistent cough, wheezing, and shortness of breath.  She has experienced these symptoms since October 13, 2023, with the cough producing yellow phlegm. Shortness of breath is exacerbated by walking. She also experiences night sweats and daytime sweating without significant fever, with a maximum temperature of 99.33F. Rapid heartbeats and panic attacks are noted.  Her medical history includes COVID-19 in 2020. She has been on disability for four years due to fatigue and brain fog. She recently found antibiotics  helpful and uses Afrin and Nasacort for nasal congestion. She has a history of post-nasal drainage and congestion.      11/06/2023   10:18 AM 11/26/2022    9:03 AM 08/27/2022    2:37 PM  Depression screen PHQ 2/9  Decreased Interest 3 1 1   Down, Depressed, Hopeless 3 1 1   PHQ - 2 Score 6 2 2   Altered sleeping 3 1 0  Tired, decreased energy 3 1 1   Change in appetite 0 0 0  Feeling bad or failure about yourself  1 0 1  Trouble concentrating 0 0 1  Moving slowly or fidgety/restless 2 0 1  Suicidal thoughts 0 0 0  PHQ-9 Score 15 4 6   Difficult doing work/chores Very difficult  Somewhat difficult      11/06/2023   10:18 AM 11/26/2022    9:03 AM 12/03/2020    8:51 AM 11/19/2020    1:43 PM  GAD 7 : Generalized Anxiety Score  Nervous, Anxious, on Edge 2 1 1 3   Control/stop worrying 2 1 1 3   Worry too much - different things 2 0 1 3  Trouble relaxing 2 0 1 1  Restless 0 0 0 0  Easily annoyed or irritable 2 0 1 1  Afraid - awful might happen 1 0 0 3  Total GAD 7 Score 11 2 5 14   Anxiety Difficulty Very difficult  Somewhat difficult Extremely difficult  Medications: Outpatient Medications Prior to Visit  Medication Sig   atorvastatin  (LIPITOR) 40 MG tablet TAKE 1 TABLET(40 MG) BY MOUTH DAILY   b complex vitamins capsule Take 1 capsule by mouth daily.   cefpodoxime  (VANTIN ) 200 MG tablet Take 1 tablet (200 mg total) by mouth 2 (two) times daily.   DULoxetine (CYMBALTA) 60 MG capsule Take 60 mg by mouth daily.   estradiol  (ESTRACE ) 0.1 MG/GM vaginal cream Place 1 Applicatorful vaginally at bedtime.   eszopiclone (LUNESTA) 1 MG TABS tablet Take 1 mg by mouth at bedtime as needed.   FLUoxetine  (PROZAC ) 40 MG capsule Take 40 mg by mouth daily. (Takes in combination with 20 mg strength for total of 60 mg each morning)   ibuprofen (ADVIL,MOTRIN) 200 MG tablet Take 400 mg by mouth at bedtime. As needed   metFORMIN  (GLUCOPHAGE -XR) 500 MG 24 hr tablet Take 2 tablets (1,000 mg total) by  mouth 2 (two) times daily with a meal.   pioglitazone  (ACTOS ) 15 MG tablet TAKE 1 TABLET(15 MG) BY MOUTH DAILY   propranolol  (INDERAL ) 10 MG tablet Take 0.5 tablets (5 mg total) by mouth 2 (two) times daily.   No facility-administered medications prior to visit.    Review of Systems  All other systems reviewed and are negative.  All negative Except see HPI       Objective    BP 113/66   Pulse 86   Resp 16   Ht 5' 5 (1.651 m)   Wt 164 lb (74.4 kg)   SpO2 100%   BMI 27.29 kg/m     Physical Exam Vitals reviewed.  Constitutional:      General: She is not in acute distress.    Appearance: Normal appearance. She is well-developed. She is not diaphoretic.  HENT:     Head: Normocephalic and atraumatic.  Eyes:     General: No scleral icterus.    Conjunctiva/sclera: Conjunctivae normal.  Neck:     Thyroid : No thyromegaly.  Cardiovascular:     Rate and Rhythm: Normal rate and regular rhythm.     Pulses: Normal pulses.     Heart sounds: Normal heart sounds. No murmur heard. Pulmonary:     Effort: Pulmonary effort is normal. No respiratory distress.     Breath sounds: Normal breath sounds. No wheezing, rhonchi or rales.  Musculoskeletal:     Cervical back: Neck supple.     Right lower leg: No edema.     Left lower leg: No edema.  Lymphadenopathy:     Cervical: No cervical adenopathy.  Skin:    General: Skin is warm and dry.     Findings: No rash.  Neurological:     Mental Status: She is alert and oriented to person, place, and time. Mental status is at baseline.  Psychiatric:        Mood and Affect: Mood normal.        Behavior: Behavior normal.      No results found for any visits on 11/12/23.      Assessment & Plan Chronic cough with postnasal drainage and congestion Chronic cough with postnasal drainage and congestion for over a month. Lungs clear, recent antibiotics helpful, no bacterial infection, possible allergic component. - Recommend antihistamines  such as Allegra or Claritin. - Use nasal saline spray or gel to cleanse nasal passages. - Use Nasacort after saline spray to reduce inflammation. - Advise increased hydration. - Reassess in one week to evaluate symptom persistence. - Consider referral to ENT  or allergy and immunology if symptoms do not improve.  Fatigue and brain fog, chronic Chronic fatigue and brain fog worsened after overexertion. Negative for COVID and flu. Possible autoimmune or long COVID component. Currently on disability due to symptoms. - Consider referral to allergy and immunology if symptoms persist. - Discuss potential referral to pulmonology if lung issues arise.  Sweating and night sweats Intermittent sweating and night sweats without fever, occurring both day and night. No clear etiology identified.  Depression/panic attacks Chronic depression with ongoing mental health treatment. Currently seeing a psychiatrist and in therapy. Seeking a second opinion for psychiatric care. Interested in integrative behavioral health services. - Refer to [facility] for collaborative counseling and psychiatric evaluation. - Advise checking with insurance for coverage of additional psychiatric services. - Continue current psychiatric care with Dr. Chipper. Collaboration of Care: Medication Management AEB  , Primary Care Provider AEB  , Psychiatrist AEB  , and Referral or follow-up with counselor/therapist AEB    Patient/Guardian was advised Release of Information must be obtained prior to any record release in order to collaborate their care with an outside provider. Patient/Guardian was advised if they have not already done so to contact the registration department to sign all necessary forms in order for us  to release information regarding their care.   Consent: Patient/Guardian gives verbal consent for treatment and assignment of benefits for services provided during this visit. Patient/Guardian expressed  understanding and agreed to proceed.   Allergic rhinitis due to other allergic trigger, unspecified seasonality Allergic Rhinitis: - Avoidance measures discussed. - Use nasal saline rinses before nose sprays such as with Neilmed Sinus Rinse bottle.  Use distilled water.   - Use Flonase  2 sprays each nostril daily. Aim upward and outward. - Use Zyrtec 10 mg daily.    No orders of the defined types were placed in this encounter.   No follow-ups on file.   The patient was advised to call back or seek an in-person evaluation if the symptoms worsen or if the condition fails to improve as anticipated.  I discussed the assessment and treatment plan with the patient. The patient was provided an opportunity to ask questions and all were answered. The patient agreed with the plan and demonstrated an understanding of the instructions.  I, Demetric Parslow, PA-C have reviewed all documentation for this visit. The documentation on 11/12/2023  for the exam, diagnosis, procedures, and orders are all accurate and complete.  Jolynn Spencer, [facility], MMS [facility] [phone] (phone) [phone] (fax)  [facility]

## 2023-11-19 ENCOUNTER — Encounter: Payer: Self-pay | Admitting: Physician Assistant

## 2023-11-19 ENCOUNTER — Ambulatory Visit (INDEPENDENT_AMBULATORY_CARE_PROVIDER_SITE_OTHER): Admitting: Physician Assistant

## 2023-11-19 ENCOUNTER — Ambulatory Visit: Payer: Self-pay

## 2023-11-19 VITALS — BP 110/60 | HR 66 | Resp 16 | Ht 65.0 in | Wt 164.0 lb

## 2023-11-19 DIAGNOSIS — G4709 Other insomnia: Secondary | ICD-10-CM | POA: Diagnosis not present

## 2023-11-19 DIAGNOSIS — Z7984 Long term (current) use of oral hypoglycemic drugs: Secondary | ICD-10-CM

## 2023-11-19 DIAGNOSIS — R5383 Other fatigue: Secondary | ICD-10-CM

## 2023-11-19 DIAGNOSIS — E039 Hypothyroidism, unspecified: Secondary | ICD-10-CM | POA: Diagnosis not present

## 2023-11-19 DIAGNOSIS — F339 Major depressive disorder, recurrent, unspecified: Secondary | ICD-10-CM | POA: Diagnosis not present

## 2023-11-19 DIAGNOSIS — J988 Other specified respiratory disorders: Secondary | ICD-10-CM

## 2023-11-19 DIAGNOSIS — F410 Panic disorder [episodic paroxysmal anxiety] without agoraphobia: Secondary | ICD-10-CM

## 2023-11-19 DIAGNOSIS — J984 Other disorders of lung: Secondary | ICD-10-CM

## 2023-11-19 DIAGNOSIS — R0609 Other forms of dyspnea: Secondary | ICD-10-CM

## 2023-11-19 DIAGNOSIS — E1169 Type 2 diabetes mellitus with other specified complication: Secondary | ICD-10-CM | POA: Diagnosis not present

## 2023-11-19 DIAGNOSIS — H9313 Tinnitus, bilateral: Secondary | ICD-10-CM

## 2023-11-19 DIAGNOSIS — E1159 Type 2 diabetes mellitus with other circulatory complications: Secondary | ICD-10-CM | POA: Diagnosis not present

## 2023-11-19 DIAGNOSIS — E1129 Type 2 diabetes mellitus with other diabetic kidney complication: Secondary | ICD-10-CM

## 2023-11-19 LAB — POCT URINALYSIS DIPSTICK
Bilirubin, UA: NEGATIVE
Blood, UA: NEGATIVE
Glucose, UA: NEGATIVE
Ketones, UA: NEGATIVE
Leukocytes, UA: NEGATIVE
Nitrite, UA: NEGATIVE
Protein, UA: NEGATIVE
Spec Grav, UA: 1.02 (ref 1.010–1.025)
Urobilinogen, UA: 0.2 U/dL
pH, UA: 6 (ref 5.0–8.0)

## 2023-11-19 NOTE — Progress Notes (Unsigned)
 Established patient visit  Patient: Melissa Arroyo   DOB: November 13, 1950   73 y.o. Female  MRN: [id number] Visit Date: 11/19/2023  Today's healthcare provider: Jolynn Spencer, PA-C   Chief Complaint  Patient presents with   Follow-up    Still coughing, a little better then before.   Medical Management of Chronic Issues    States of doing good, does not monitor DM at home but pt knows when she is not good.  No eye exam scheduled yet   Subjective     HPI     Follow-up    Additional comments: Still coughing, a little better then before.        Medical Management of Chronic Issues    Additional comments: States of doing good, does not monitor DM at home but pt knows when she is not good.  No eye exam scheduled yet      Last edited by Wilfred Hargis RAMAN, CMA on 11/19/2023  8:10 AM.       Discussed the use of AI scribe software for clinical note transcription with the patient, who gave verbal consent to proceed.  History of Present Illness Melissa Arroyo is a 73 year old female who presents with persistent shortness of breath and cognitive fogginess.  She experiences persistent shortness of breath and cognitive fogginess, with confusion and difficulty processing information. These symptoms have been ongoing since 2019 and are currently the worst she has ever been. No chest pain is present, and she manages her shortness of breath by 'moving through it'.  Her blood pressure is typically around 113/80 mmHg, but she has not been measuring it recently. She had COVID-19 in 2020 and wonders if her symptoms could be related to post-COVID conditions.  Her sleep is disturbed despite taking 1 mg of Lunesta, often lying awake in bed. She feels exhausted and unable to work due to her symptoms. Counseling has been scheduled, but follow-up has been hindered by communication issues.  Nutritionally, she consumes oatmeal with berries and almonds, a latte with a mix of caffeine and non-caffeine, and  soup for lunch. She eats potato chips for salt intake. She denies issues with vitamin D  deficiency and has not been taking vitamin D  supplements.  She feels unbalanced when walking but not when changing positions from lying to sitting or sitting to standing. No rapid heartbeats are noted, and her EKG readings from her watch have been normal.  She is experiencing financial difficulties and is trying to secure a loan. She wishes to return to work but feels unable due to her current health condition.       11/06/2023   10:18 AM 11/26/2022    9:03 AM 08/27/2022    2:37 PM  Depression screen PHQ 2/9  Decreased Interest 3 1 1   Down, Depressed, Hopeless 3 1 1   PHQ - 2 Score 6 2 2   Altered sleeping 3 1 0  Tired, decreased energy 3 1 1   Change in appetite 0 0 0  Feeling bad or failure about yourself  1 0 1  Trouble concentrating 0 0 1  Moving slowly or fidgety/restless 2 0 1  Suicidal thoughts 0 0 0  PHQ-9 Score 15 4 6   Difficult doing work/chores Very difficult  Somewhat difficult      11/06/2023   10:18 AM 11/26/2022    9:03 AM 12/03/2020    8:51 AM 11/19/2020    1:43 PM  GAD 7 : Generalized Anxiety Score  Nervous, Anxious, on Edge  2 1 1 3   Control/stop worrying 2 1 1 3   Worry too much - different things 2 0 1 3  Trouble relaxing 2 0 1 1  Restless 0 0 0 0  Easily annoyed or irritable 2 0 1 1  Afraid - awful might happen 1 0 0 3  Total GAD 7 Score 11 2 5 14   Anxiety Difficulty Very difficult  Somewhat difficult Extremely difficult    Medications: Outpatient Medications Prior to Visit  Medication Sig   atorvastatin  (LIPITOR) 40 MG tablet TAKE 1 TABLET(40 MG) BY MOUTH DAILY   b complex vitamins capsule Take 1 capsule by mouth daily.   DULoxetine (CYMBALTA) 60 MG capsule Take 60 mg by mouth daily.   estradiol  (ESTRACE ) 0.1 MG/GM vaginal cream Place 1 Applicatorful vaginally at bedtime.   eszopiclone (LUNESTA) 1 MG TABS tablet Take 1 mg by mouth at bedtime as needed.    FLUoxetine  (PROZAC ) 40 MG capsule Take 40 mg by mouth daily. (Takes in combination with 20 mg strength for total of 60 mg each morning)   ibuprofen (ADVIL,MOTRIN) 200 MG tablet Take 400 mg by mouth at bedtime. As needed   metFORMIN  (GLUCOPHAGE -XR) 500 MG 24 hr tablet Take 2 tablets (1,000 mg total) by mouth 2 (two) times daily with a meal.   pioglitazone  (ACTOS ) 15 MG tablet TAKE 1 TABLET(15 MG) BY MOUTH DAILY   propranolol  (INDERAL ) 10 MG tablet Take 0.5 tablets (5 mg total) by mouth 2 (two) times daily.   [DISCONTINUED] cefpodoxime  (VANTIN ) 200 MG tablet Take 1 tablet (200 mg total) by mouth 2 (two) times daily.   No facility-administered medications prior to visit.    Review of Systems All negative Except see HPI   {Insert previous labs (optional):23779} {See past labs  Heme  Chem  Endocrine  Serology  Results Review (optional):1}   Objective    BP 110/60   Pulse 66   Resp 16   Ht 5' 5 (1.651 m)   Wt 164 lb (74.4 kg)   SpO2 100%   BMI 27.29 kg/m  {Insert last BP/Wt (optional):23777}{See vitals history (optional):1}   Physical Exam Vitals reviewed.  Constitutional:      General: She is not in acute distress.    Appearance: Normal appearance. She is well-developed. She is not diaphoretic.  HENT:     Head: Normocephalic and atraumatic.  Eyes:     General: No scleral icterus.    Conjunctiva/sclera: Conjunctivae normal.  Neck:     Thyroid : No thyromegaly.  Cardiovascular:     Rate and Rhythm: Normal rate and regular rhythm.     Pulses: Normal pulses.     Heart sounds: Normal heart sounds. No murmur heard. Pulmonary:     Effort: Pulmonary effort is normal. No respiratory distress.     Breath sounds: Normal breath sounds. No wheezing, rhonchi or rales.  Musculoskeletal:     Cervical back: Neck supple.     Right lower leg: No edema.     Left lower leg: No edema.  Lymphadenopathy:     Cervical: No cervical adenopathy.  Skin:    General: Skin is warm and dry.      Findings: No rash.  Neurological:     Mental Status: She is alert and oriented to person, place, and time. Mental status is at baseline.  Psychiatric:        Mood and Affect: Mood normal.        Behavior: Behavior normal.  Results for orders placed or performed in visit on 11/19/23  POCT urinalysis dipstick  Result Value Ref Range   Color, UA Yellow    Clarity, UA Clear    Glucose, UA Negative Negative   Bilirubin, UA Negative    Ketones, UA Negative    Spec Grav, UA 1.020 1.010 - 1.025   Blood, UA Negative    pH, UA 6.0 5.0 - 8.0   Protein, UA Negative Negative   Urobilinogen, UA 0.2 0.2 or 1.0 E.U./dL   Nitrite, UA Negative    Leukocytes, UA Negative Negative   Appearance clear    Odor          Assessment & Plan Shortness of breath and cough Persistent symptoms possibly related to post-COVID-19 condition. No acute pulmonary irregularity on chest x-ray. - Order blood work for anemia, electrolytes, liver and kidney function, cholesterol, thyroid  function, vitamin B12, vitamin D , and diabetes (A1c). - Perform urine sample analysis.  Fatigue and brain fog Ongoing symptoms possibly exacerbated by financial stress and lack of sleep. Differential includes anemia, electrolyte imbalance, thyroid  dysfunction, vitamin deficiencies, and post-COVID-19 condition. - Order blood work for anemia, electrolytes, liver and kidney function, cholesterol, thyroid  function, vitamin B12, vitamin D , and diabetes (A1c). - Refer to behavioral health for counseling support.  Insomnia Difficulty sleeping despite Lunesta, possibly related to stress and anxiety. - Refer to behavioral health for counseling support.  Low blood pressure Low blood pressure with associated brain fog and confusion. Current low blood pressure contributing to symptoms. - Order blood work for anemia, electrolytes, liver and kidney function, cholesterol, thyroid  function, vitamin B12, vitamin D , and diabetes  (A1c).  Hearing disturbance (bilateral)/Tinnitus Chronic, could be age-related? Bilateral hearing disturbance described as ocean-like sounds. No acute findings. Consider a referral to ENT  Chronic rhinitis (post-nasal drip) Chronic post-nasal drip consistent with allergies.  Post COVID-19 condition Symptoms of shortness of breath, fatigue, and brain fog may be related to post-COVID-19 condition. - Order blood work for anemia, electrolytes, liver and kidney function, cholesterol, thyroid  function, vitamin B12, vitamin D , and diabetes (A1c).    Orders Placed This Encounter  Procedures   Hemoglobin A1c   Lipid panel    Has the patient fasted?:   Yes   CBC with Differential/Platelet   Comprehensive metabolic panel with GFR    Has the patient fasted?:   Yes   TSH   Vitamin B12   Urinalysis, microscopic only   Pro b natriuretic peptide (BNP)[id number])   D-Dimer, Quantitative   Troponin T   Ambulatory referral to Pulmonology    Referral Priority:   Routine    Referral Type:   Consultation    Referral Reason:   Specialty Services Required    Requested Specialty:   Pulmonary Disease    Number of Visits Requested:   1   POCT urinalysis dipstick   EKG 12-Lead    No follow-ups on file.   The patient was advised to call back or seek an in-person evaluation if the symptoms worsen or if the condition fails to improve as anticipated.  I discussed the assessment and treatment plan with the patient. The patient was provided an opportunity to ask questions and all were answered. The patient agreed with the plan and demonstrated an understanding of the instructions.  I, Delroy Ordway, PA-C have reviewed all documentation for this visit. The documentation on 11/19/2023  for the exam, diagnosis, procedures, and orders are all accurate and complete.  Anastashia Westerfeld, PAC,  MMS [facility] [phone] (phone) [phone] (fax)  [facility]

## 2023-11-19 NOTE — Telephone Encounter (Signed)
 FYI Only or Action Required?: FYI only for provider.  Patient is followed in Pulmonology for shortness of breath, last seen on n/a.  Called Nurse Triage reporting Shortness of Breath.  Symptoms began several years ago.  Interventions attempted: Other: multiple.  Symptoms are: unchanged.  Triage Disposition: See PCP Within 2 Weeks  Patient/caregiver understands and will follow disposition?: Yes  Pt transferred to Pulm to schedule.      Copied from CRM #[id number]. Topic: Clinical - Red Word Triage >> Nov 19, 2023  4:18 PM Melissa Arroyo wrote: Red Word that prompted transfer to Nurse Triage: Patient [id number] states having shortness of breath, brain confusion/fog, fatigue, dizziness, and balancing issue. Patient denies no pain, nor fever. Patient states saw pcp this moning viral issues for about 6 weeks,  and was advised to contact pulmonologist. Patient has an appointment with cardiologist tomorrow morning. Patient has an upcoming appointment with NP, Cobb 12/09/23 at 8:30 am and wants to be seen sooner. Please advise. Reason for Disposition  [1] MILD longstanding difficulty breathing (e.g., minimal/no SOB at rest, SOB with walking, pulse < 100) AND [2] SAME as normal  Answer Assessment - Initial Assessment Questions Pt states that she has brain fog and shortness of breath. Just getting over a virus, still have slight cough, does have dizziness occasionally.      1. RESPIRATORY STATUS: Describe your breathing? (e.g., wheezing, shortness of breath, unable to speak, severe coughing)      Shortness of breath even with speaking 2. ONSET: When did this breathing problem begin?      Ongoing  4 years 3. PATTERN Does the difficult breathing come and go, or has it been constant since it started?      Some times intermittent and some times constant 4. SEVERITY: How bad is your breathing? (e.g., mild, moderate, severe)      mod 5. RECURRENT SYMPTOM: Have you had difficulty breathing  before? If Yes, ask: When was the last time? and What happened that time?      yes 6. CARDIAC HISTORY: Do you have any history of heart disease? (e.g., heart attack, angina, bypass surgery, angioplasty)      Heart attack 2009, htn 7. LUNG HISTORY: Do you have any history of lung disease?  (e.g., pulmonary embolus, asthma, emphysema)     no 8. CAUSE: What do you think is causing the breathing problem?      Pcp thinks long covid 9. OTHER SYMPTOMS: Do you have any other symptoms? (e.g., chest pain, cough, dizziness, fever, runny nose)     Sweats occasionally 10. O2 SATURATION MONITOR:  Do you use an oxygen saturation monitor (pulse oximeter) at home? If Yes, ask: What is your reading (oxygen level) today? What is your usual oxygen saturation reading? (e.g., 95%)  Protocols used: Breathing Difficulty-A-AH

## 2023-11-20 DIAGNOSIS — R0602 Shortness of breath: Secondary | ICD-10-CM | POA: Diagnosis not present

## 2023-11-20 DIAGNOSIS — E782 Mixed hyperlipidemia: Secondary | ICD-10-CM | POA: Diagnosis not present

## 2023-11-20 DIAGNOSIS — E119 Type 2 diabetes mellitus without complications: Secondary | ICD-10-CM | POA: Diagnosis not present

## 2023-11-20 DIAGNOSIS — G4709 Other insomnia: Secondary | ICD-10-CM | POA: Insufficient documentation

## 2023-11-20 DIAGNOSIS — I10 Essential (primary) hypertension: Secondary | ICD-10-CM | POA: Diagnosis not present

## 2023-11-20 DIAGNOSIS — R0600 Dyspnea, unspecified: Secondary | ICD-10-CM | POA: Insufficient documentation

## 2023-11-20 DIAGNOSIS — H9313 Tinnitus, bilateral: Secondary | ICD-10-CM | POA: Insufficient documentation

## 2023-11-20 LAB — URINALYSIS, MICROSCOPIC ONLY
Bacteria, UA: NONE SEEN
Casts: NONE SEEN /LPF

## 2023-11-23 ENCOUNTER — Encounter: Payer: Self-pay | Admitting: Family Medicine

## 2023-11-23 ENCOUNTER — Ambulatory Visit (INDEPENDENT_AMBULATORY_CARE_PROVIDER_SITE_OTHER): Admitting: Family Medicine

## 2023-11-23 VITALS — BP 126/45 | HR 76 | Resp 21 | Ht 65.0 in | Wt 164.0 lb

## 2023-11-23 DIAGNOSIS — E1169 Type 2 diabetes mellitus with other specified complication: Secondary | ICD-10-CM | POA: Diagnosis not present

## 2023-11-23 DIAGNOSIS — I951 Orthostatic hypotension: Secondary | ICD-10-CM

## 2023-11-23 DIAGNOSIS — E785 Hyperlipidemia, unspecified: Secondary | ICD-10-CM | POA: Diagnosis not present

## 2023-11-23 DIAGNOSIS — R42 Dizziness and giddiness: Secondary | ICD-10-CM | POA: Diagnosis not present

## 2023-11-23 DIAGNOSIS — F419 Anxiety disorder, unspecified: Secondary | ICD-10-CM | POA: Diagnosis not present

## 2023-11-23 DIAGNOSIS — R4189 Other symptoms and signs involving cognitive functions and awareness: Secondary | ICD-10-CM | POA: Diagnosis not present

## 2023-11-23 DIAGNOSIS — E1159 Type 2 diabetes mellitus with other circulatory complications: Secondary | ICD-10-CM | POA: Diagnosis not present

## 2023-11-23 DIAGNOSIS — R809 Proteinuria, unspecified: Secondary | ICD-10-CM | POA: Diagnosis not present

## 2023-11-23 DIAGNOSIS — F39 Unspecified mood [affective] disorder: Secondary | ICD-10-CM | POA: Diagnosis not present

## 2023-11-23 DIAGNOSIS — R0609 Other forms of dyspnea: Secondary | ICD-10-CM | POA: Diagnosis not present

## 2023-11-23 DIAGNOSIS — R5383 Other fatigue: Secondary | ICD-10-CM | POA: Diagnosis not present

## 2023-11-23 DIAGNOSIS — I152 Hypertension secondary to endocrine disorders: Secondary | ICD-10-CM | POA: Diagnosis not present

## 2023-11-23 DIAGNOSIS — E1129 Type 2 diabetes mellitus with other diabetic kidney complication: Secondary | ICD-10-CM | POA: Diagnosis not present

## 2023-11-23 LAB — GLUCOSE, POCT (MANUAL RESULT ENTRY): POC Glucose: 219 mg/dL — AB (ref 70–99)

## 2023-11-23 NOTE — Patient Instructions (Signed)
 Don't take any magnesium until we get all of your labs back

## 2023-11-23 NOTE — Progress Notes (Signed)
 Established patient visit   Patient: Melissa Arroyo   DOB: [birth year]   73 y.o. Female  MRN: [id number] Visit Date: 11/23/2023  Today's healthcare provider: Nancyann Perry, MD   Chief Complaint  Patient presents with   Fatigue   Subjective    Discussed the use of AI scribe software for clinical note transcription with the patient, who gave verbal consent to proceed.  History of Present Illness   Melissa Arroyo is a 73 year old female who presents with cognitive difficulties and fatigue following respiratory illness several weeks ago and medication changes.  She has been experiencing cognitive difficulties, including feeling 'foggy' and having trouble processing information, since she became ill on September 2nd. She describes her brain as not wanting to connect, which has made returning to work challenging.  She reports that after her recent blood draw, she felt increased fatigue and cognitive fog. She has been spending significant time in bed and has had difficulty eating. Her cognitive symptoms persist, and she is uncertain about the need for hospitalization.  She has a history of anxiety and panic attacks, which are sometimes manageable for days or weeks before recurring. She was previously on propranolol  as needed but has tapered off. She stopped taking olanzapine  at the end of June, which she believes was affecting her cognitive function. After discontinuation, she felt better for about six weeks before her symptoms returned.  She has been taking magnesium supplements for about two months, with a dose of 750 mg last night, and also took ibuprofen with the magnesium. She reports difficulty sleeping and has been using magnesium to help, but it has not been effective. She previously took Menest for sleep but stopped due to grogginess the next day.  She recently traveled to [place]  and subsequently developed bronchitis and a viral illness. She has since recovered from the virus  but still has a slight cough. She has been off regular medication since then.  She has been monitoring her blood sugar with a watch but has not been able to use it recently due to a lack of a charger. She finds finger sticks painful and has not been able to check her blood sugar levels.  She has been in therapy for many years and has been with her current therapist for two years. She wants to stop talk therapy, feeling that her issues may be chemical or related to her liver or kidneys. she had been on olanzapine  for several years which she weaned off of a few months ago.       Medications: Outpatient Medications Prior to Visit  Medication Sig   b complex vitamins capsule Take 1 capsule by mouth daily.   Berberine Chloride (BERBERINE HCI PO) Take by mouth.   Cholecalciferol (VITAMIN D3) 250 MCG (10000 UT) capsule Take 10,000 Units by mouth daily.   DHA-EPA-Vitamin E (OMEGA-3 COMPLEX PO) Take by mouth.   eszopiclone (LUNESTA) 1 MG TABS tablet Take 1 mg by mouth at bedtime as needed.   MAGNESIUM PO Take by mouth.   Misc Natural Products (TURMERIC, CURCUMIN, PO) Take by mouth.   Multiple Vitamins-Minerals (ZINC PO) Take by mouth.   atorvastatin  (LIPITOR) 40 MG tablet TAKE 1 TABLET(40 MG) BY MOUTH DAILY (Patient not taking: Reported on 11/23/2023)   DULoxetine (CYMBALTA) 60 MG capsule Take 60 mg by mouth daily. (Patient not taking: Reported on 11/23/2023)   estradiol  (ESTRACE ) 0.1 MG/GM vaginal cream Place 1 Applicatorful vaginally at bedtime. (Patient not  taking: Reported on 11/23/2023)   FLUoxetine  (PROZAC ) 40 MG capsule Take 40 mg by mouth daily. (Takes in combination with 20 mg strength for total of 60 mg each morning) (Patient not taking: Reported on 11/23/2023)   ibuprofen (ADVIL,MOTRIN) 200 MG tablet Take 400 mg by mouth at bedtime. As needed (Patient not taking: Reported on 11/23/2023)   metFORMIN  (GLUCOPHAGE -XR) 500 MG 24 hr tablet Take 2 tablets (1,000 mg total) by mouth 2 (two) times  daily with a meal. (Patient not taking: Reported on 11/23/2023)   pioglitazone  (ACTOS ) 15 MG tablet TAKE 1 TABLET(15 MG) BY MOUTH DAILY (Patient not taking: Reported on 11/23/2023)   propranolol  (INDERAL ) 10 MG tablet Take 0.5 tablets (5 mg total) by mouth 2 (two) times daily. (Patient not taking: Reported on 11/23/2023)   No facility-administered medications prior to visit.   Review of Systems  Constitutional:  Positive for fatigue. Negative for appetite change, chills and fever.  Respiratory:  Positive for shortness of breath. Negative for cough, chest tightness, wheezing and stridor.   Cardiovascular:  Negative for chest pain and palpitations.  Gastrointestinal:  Negative for abdominal pain, nausea and vomiting.  Neurological:  Positive for dizziness. Negative for weakness.       Objective    BP (!) 126/45 (BP Location: Left Arm, Patient Position: Sitting, Cuff Size: Normal)   Pulse 76   Resp (!) 21   Ht 5' 5 (1.651 m)   Wt 164 lb (74.4 kg)   SpO2 100%   BMI 27.29 kg/m  Orthostatic VS for the past 24 hrs (Last 3 readings):  BP- Lying Pulse- Lying BP- Sitting Pulse- Sitting BP- Standing at 0 minutes Pulse- Standing at 0 minutes  11/23/23 0919 136/68 71 121/60 72 119/58 86    Physical Exam   General: Appearance:    Well developed, well nourished female in no acute distress  Eyes:    PERRL, conjunctiva/corneas clear, EOM's intact       Lungs:     Clear to auscultation bilaterally, respirations unlabored  Heart:    Normal heart rate. Normal rhythm. No murmurs, rubs, or gallops.    MS:   All extremities are intact.    Neurologic:   Awake, alert, oriented x 3. No apparent focal neurological defect.        Results for orders placed or performed in visit on 11/23/23  POCT glucose (manual entry)  Result Value Ref Range   POC Glucose 219 (A) 70 - 99 mg/dl     Assessment & Plan        Lightheadedness and cognitive fog, orthostatic hypotension Symptoms include difficulty  with mental processing and fatigue. Possible dehydration indicated by orthostatic hypotension. Recent cessation of Menest and propranolol  may contribute. - Encourage oral hydration with water. - Check blood sugar level.  Excessive magnesium ingestion - Add magnesium level to lab tests drawn prior to todays visit. Advised to not take any magnesium supplements until we review labs.   Recent viral bronchitis (improving) Improvement noted with residual cough present.  Type 2 diabetes mellitus Difficulty monitoring blood sugar due to lack of a charger for her monitoring device. Blood sugar levels currently unknown. - Check current blood sugar level.  Anxiety and panic attacks Panic attacks occur intermittently. Propranolol  was tapered off due to lack of efficacy.  Mood disorder Previous use of olanzapine  discontinued three months ago due to cognitive side effects. Improved cognitive function after stopping olanzapinwhe.   Had visit with cardiology three days ago and is  scheduled for echocardiogram and stress test.   Had visit with Janna Ostwalt four days ago without ordered extensive labs including BNP, troponin, CBC, Met C, A1c thyroid  studies drawn this morning just prior to visit.   Addressed extensive list of chronic and acute medical problems today requiring 45 minutes reviewing her medical record, counseling patient regarding her conditions and coordination of care.           Nancyann Perry, MD  [facility] [phone] (phone) [phone] (fax)  [facility]

## 2023-11-24 ENCOUNTER — Encounter: Payer: Self-pay | Admitting: Family Medicine

## 2023-11-24 LAB — CBC WITH DIFFERENTIAL/PLATELET
Basophils Absolute: 0.1 x10E3/uL (ref 0.0–0.2)
Basos: 1 %
EOS (ABSOLUTE): 0.2 x10E3/uL (ref 0.0–0.4)
Eos: 4 %
Hematocrit: 37.4 % (ref 34.0–46.6)
Hemoglobin: 11.9 g/dL (ref 11.1–15.9)
Immature Grans (Abs): 0 x10E3/uL (ref 0.0–0.1)
Immature Granulocytes: 0 %
Lymphocytes Absolute: 2.2 x10E3/uL (ref 0.7–3.1)
Lymphs: 40 %
MCH: 30.8 pg (ref 26.6–33.0)
MCHC: 31.8 g/dL (ref 31.5–35.7)
MCV: 97 fL (ref 79–97)
Monocytes Absolute: 0.4 x10E3/uL (ref 0.1–0.9)
Monocytes: 7 %
Neutrophils Absolute: 2.6 x10E3/uL (ref 1.4–7.0)
Neutrophils: 48 %
Platelets: 290 x10E3/uL (ref 150–450)
RBC: 3.86 x10E6/uL (ref 3.77–5.28)
RDW: 13.1 % (ref 11.7–15.4)
WBC: 5.4 x10E3/uL (ref 3.4–10.8)

## 2023-11-24 LAB — D-DIMER, QUANTITATIVE: D-DIMER: 0.53 mg{FEU}/L — ABNORMAL HIGH (ref 0.00–0.49)

## 2023-11-24 LAB — HEMOGLOBIN A1C
Est. average glucose Bld gHb Est-mCnc: 163 mg/dL
Hgb A1c MFr Bld: 7.3 % — ABNORMAL HIGH (ref 4.8–5.6)

## 2023-11-24 LAB — COMPREHENSIVE METABOLIC PANEL WITH GFR
ALT: 33 IU/L — ABNORMAL HIGH (ref 0–32)
AST: 31 IU/L (ref 0–40)
Albumin: 4.3 g/dL (ref 3.8–4.8)
Alkaline Phosphatase: 143 IU/L — ABNORMAL HIGH (ref 49–135)
BUN/Creatinine Ratio: 21 (ref 12–28)
BUN: 16 mg/dL (ref 8–27)
Bilirubin Total: 0.5 mg/dL (ref 0.0–1.2)
CO2: 19 mmol/L — ABNORMAL LOW (ref 20–29)
Calcium: 9.5 mg/dL (ref 8.7–10.3)
Chloride: 103 mmol/L (ref 96–106)
Creatinine, Ser: 0.78 mg/dL (ref 0.57–1.00)
Globulin, Total: 2.5 g/dL (ref 1.5–4.5)
Glucose: 184 mg/dL — ABNORMAL HIGH (ref 70–99)
Potassium: 4.8 mmol/L (ref 3.5–5.2)
Sodium: 139 mmol/L (ref 134–144)
Total Protein: 6.8 g/dL (ref 6.0–8.5)
eGFR: 81 mL/min/1.73 (ref >59)

## 2023-11-24 LAB — MAGNESIUM: Magnesium: 2.1 mg/dL (ref 1.6–2.3)

## 2023-11-24 LAB — LIPID PANEL
Chol/HDL Ratio: 3.2 ratio (ref 0.0–4.4)
Cholesterol, Total: 230 mg/dL — ABNORMAL HIGH (ref 100–199)
HDL: 73 mg/dL (ref >39)
LDL Chol Calc (NIH): 138 mg/dL — ABNORMAL HIGH (ref 0–99)
Triglycerides: 110 mg/dL (ref 0–149)
VLDL Cholesterol Cal: 19 mg/dL (ref 5–40)

## 2023-11-24 LAB — VITAMIN B12: Vitamin B-12: 542 pg/mL (ref 232–1245)

## 2023-11-24 LAB — TSH: TSH: 2.28 u[IU]/mL (ref 0.450–4.500)

## 2023-11-24 LAB — TROPONIN T: Troponin T (Highly Sensitive): 12 ng/L (ref 0–14)

## 2023-11-24 LAB — PRO B NATRIURETIC PEPTIDE: NT-Pro BNP: 91 pg/mL (ref 0–301)

## 2023-11-25 ENCOUNTER — Ambulatory Visit: Payer: Self-pay | Admitting: Physician Assistant

## 2023-11-25 ENCOUNTER — Ambulatory Visit: Admitting: Sleep Medicine

## 2023-11-25 NOTE — Telephone Encounter (Signed)
 Copied from CRM [id number]. Topic: Clinical - Request for Lab/Test Order >> Nov 25, 2023 10:32 AM Turkey B wrote: Reason for CRM: Patient called in for lab results. I let her know waiting on Dr's notes and she will be called

## 2023-11-26 ENCOUNTER — Ambulatory Visit: Payer: Self-pay

## 2023-11-26 NOTE — Telephone Encounter (Signed)
 FYI Only or Action Required?: Action required by provider: lab or test result follow-up needed.  Patient was last seen in primary care on 11/23/2023 by Gasper Nancyann BRAVO, MD.  Called Nurse Triage reporting Hypertension.  Symptoms began several weeks ago.  Interventions attempted: Rest, hydration, or home remedies and Other: reset her medications.  Symptoms are: unchanged.  Triage Disposition: See PCP When Office is Open (Within 3 Days)  Patient/caregiver understands and will follow disposition?: Yes    Copied from CRM [id number]. Topic: Clinical - Red Word Triage >> Nov 26, 2023  9:30 AM Charlet HERO wrote: Red Word that prompted transfer to Nurse Triage: Patient is calling about her labs and he bp is 165/174 and she is feeling really tired. Reason for Disposition  [1] Taking BP medications AND [2] feels is having side effects (e.g., impotence, cough, dizzy upon standing)  Answer Assessment - Initial Assessment Questions Additional info: Patient called in for her lab results, chart reviewed, labs have not been reviewed or notations by pcp. Patient requesting call back to review lab work. She continues with symptoms of fatigue, shortness of breath, increased blood pressure. Appointment scheduled with pcp on 11/27/23.     1. BLOOD PRESSURE: What is your blood pressure? Did you take at least two measurements 5 minutes apart?     6:30am 165/71, recheck 7:50 144/60, 61 2. ONSET: When did you take your blood pressure?     today 3. HOW: How did you take your blood pressure? (e.g., automatic home BP monitor, visiting nurse)     Home cuff 4. HISTORY: Do you have a history of high blood pressure?     yes 5. MEDICINES: Are you taking any medicines for blood pressure? Have you missed any doses recently?     Not taking as prescribed I did a full reset did not elaborate.  6. OTHER SYMPTOMS: Do you have any symptoms? (e.g., blurred vision, chest pain, difficulty breathing,  headache, weakness)     Cold sweats, fatigue, intermittent shortness of breath, feet and palms are itchy-cortisone helpful.  7. PREGNANCY: Is there any chance you are pregnant? When was your last menstrual period?  Protocols used: Blood Pressure - High-A-AH

## 2023-11-27 ENCOUNTER — Ambulatory Visit (INDEPENDENT_AMBULATORY_CARE_PROVIDER_SITE_OTHER): Admitting: Physician Assistant

## 2023-11-27 ENCOUNTER — Encounter: Payer: Self-pay | Admitting: Physician Assistant

## 2023-11-27 VITALS — BP 136/55 | HR 69 | Resp 16 | Ht 65.0 in | Wt 164.0 lb

## 2023-11-27 DIAGNOSIS — E1159 Type 2 diabetes mellitus with other circulatory complications: Secondary | ICD-10-CM | POA: Diagnosis not present

## 2023-11-27 DIAGNOSIS — E663 Overweight: Secondary | ICD-10-CM

## 2023-11-27 DIAGNOSIS — R748 Abnormal levels of other serum enzymes: Secondary | ICD-10-CM

## 2023-11-27 DIAGNOSIS — R809 Proteinuria, unspecified: Secondary | ICD-10-CM | POA: Diagnosis not present

## 2023-11-27 DIAGNOSIS — F339 Major depressive disorder, recurrent, unspecified: Secondary | ICD-10-CM | POA: Diagnosis not present

## 2023-11-27 DIAGNOSIS — Z7984 Long term (current) use of oral hypoglycemic drugs: Secondary | ICD-10-CM

## 2023-11-27 DIAGNOSIS — G4709 Other insomnia: Secondary | ICD-10-CM | POA: Diagnosis not present

## 2023-11-27 DIAGNOSIS — F419 Anxiety disorder, unspecified: Secondary | ICD-10-CM

## 2023-11-27 DIAGNOSIS — E785 Hyperlipidemia, unspecified: Secondary | ICD-10-CM | POA: Diagnosis not present

## 2023-11-27 DIAGNOSIS — E1129 Type 2 diabetes mellitus with other diabetic kidney complication: Secondary | ICD-10-CM

## 2023-11-27 DIAGNOSIS — I152 Hypertension secondary to endocrine disorders: Secondary | ICD-10-CM

## 2023-11-27 DIAGNOSIS — E1169 Type 2 diabetes mellitus with other specified complication: Secondary | ICD-10-CM

## 2023-11-29 DIAGNOSIS — E663 Overweight: Secondary | ICD-10-CM | POA: Insufficient documentation

## 2023-11-29 DIAGNOSIS — F419 Anxiety disorder, unspecified: Secondary | ICD-10-CM | POA: Insufficient documentation

## 2023-11-29 DIAGNOSIS — R748 Abnormal levels of other serum enzymes: Secondary | ICD-10-CM | POA: Insufficient documentation

## 2023-11-29 MED ORDER — LANCET DEVICE MISC: 1.0000 | 0 refills | Status: AC

## 2023-11-29 MED ORDER — LANCETS MISC
1.0000 | 0 refills | Status: AC
Start: 2023-11-29 — End: ?

## 2023-11-29 MED ORDER — BLOOD GLUCOSE TEST VI STRP: 1.0000 | ORAL_STRIP | 0 refills | Status: AC

## 2023-11-29 MED ORDER — BLOOD GLUCOSE MONITORING SUPPL DEVI: 1.0000 | 0 refills | Status: AC

## 2023-11-29 NOTE — Progress Notes (Signed)
 Established patient visit  Patient: Melissa Arroyo   DOB: [birth year]   73 y.o. Female  MRN: [id number] Visit Date: 11/27/2023  Today's healthcare provider: Jolynn Spencer, PA-C   Chief Complaint  Patient presents with   Follow-up    Pt seen cardiology 11/20/23 Saw Dr. Gasper 10/13 as discussed with him Dizzy, fatigue, brain fod, mood, anxiety. ECG- normal at cardio. Future order for stress test and echo scheduled. Pt has a f/u appt with cardio 01/01/24 Behavorial Health referral placed 10/2 Pt would like referral for nutrition  Discuss medication for sleep, would like monitor order to check. Wants to know if she can go back to taking BP medications Feliz, pt has not been taking any BP medication in July.    Subjective     HPI     Follow-up    Additional comments: Pt seen cardiology 11/20/23 Saw Dr. Gasper 10/13 as discussed with him Dizzy, fatigue, brain fod, mood, anxiety. ECG- normal at cardio. Future order for stress test and echo scheduled. Pt has a f/u appt with cardio 01/01/24 Behavorial Health referral placed 10/2 Pt would like referral for nutrition  Discuss medication for sleep, would like monitor order to check. Wants to know if she can go back to taking BP medications Feliz, pt has not been taking any BP medication in July.       Last edited by Wilfred Hargis RAMAN, CMA on 11/27/2023  2:20 PM.       Discussed the use of AI scribe software for clinical note transcription with the patient, who gave verbal consent to proceed.  History of Present Illness Melissa Arroyo is a 73 year old female with diabetes and anxiety who presents with concerns about her lab results and ongoing anxiety management.  She feels overwhelmed and scared due to ongoing health issues and confusion about care coordination. She recently had an EKG similar to previous ones and lab results showing an A1c of 7.3. She follows a low-carb diet and takes metformin  and pioglitazone . She needs a glucometer  for blood sugar monitoring. She is concerned about cholesterol levels and takes Lipitor. There is an elevation in alkaline phosphatase and liver enzymes, but no right upper quadrant abdominal pain.  She communicates with Dr. Chipper about adjusting her antidepressant regimen. She has not attended counseling recently due to feeling overwhelmed and lacking practical support. She needs a psychologist for PTSD and trauma integration.  Her family is distant, but her husband is supportive and provides transportation. She uses [facility] for appointments. She has difficulty sleeping, uses Tylenol  and yogi tea for relaxation, and experiences grogginess and headaches upon waking.       11/06/2023   10:18 AM 11/26/2022    9:03 AM 08/27/2022    2:37 PM  Depression screen PHQ 2/9  Decreased Interest 3 1 1   Down, Depressed, Hopeless 3 1 1   PHQ - 2 Score 6 2 2   Altered sleeping 3 1 0  Tired, decreased energy 3 1 1   Change in appetite 0 0 0  Feeling bad or failure about yourself  1 0 1  Trouble concentrating 0 0 1  Moving slowly or fidgety/restless 2 0 1  Suicidal thoughts 0 0 0  PHQ-9 Score 15 4 6   Difficult doing work/chores Very difficult  Somewhat difficult      11/06/2023   10:18 AM 11/26/2022    9:03 AM 12/03/2020    8:51 AM 11/19/2020    1:43 PM  GAD 7 : Generalized Anxiety  Score  Nervous, Anxious, on Edge 2 1 1 3   Control/stop worrying 2 1 1 3   Worry too much - different things 2 0 1 3  Trouble relaxing 2 0 1 1  Restless 0 0 0 0  Easily annoyed or irritable 2 0 1 1  Afraid - awful might happen 1 0 0 3  Total GAD 7 Score 11 2 5 14   Anxiety Difficulty Very difficult  Somewhat difficult Extremely difficult    Medications: Outpatient Medications Prior to Visit  Medication Sig   b complex vitamins capsule Take 1 capsule by mouth daily.   Berberine Chloride (BERBERINE HCI PO) Take by mouth.   Cholecalciferol (VITAMIN D3) 250 MCG (10000 UT) capsule Take 10,000 Units by mouth daily.    DHA-EPA-Vitamin E (OMEGA-3 COMPLEX PO) Take by mouth.   eszopiclone (LUNESTA) 1 MG TABS tablet Take 1 mg by mouth at bedtime as needed.   MAGNESIUM PO Take by mouth.   Misc Natural Products (TURMERIC, CURCUMIN, PO) Take by mouth.   Multiple Vitamins-Minerals (ZINC PO) Take by mouth.   atorvastatin  (LIPITOR) 40 MG tablet TAKE 1 TABLET(40 MG) BY MOUTH DAILY (Patient not taking: Reported on 11/27/2023)   DULoxetine (CYMBALTA) 60 MG capsule Take 60 mg by mouth daily. (Patient not taking: Reported on 11/27/2023)   estradiol  (ESTRACE ) 0.1 MG/GM vaginal cream Place 1 Applicatorful vaginally at bedtime. (Patient not taking: Reported on 11/27/2023)   FLUoxetine  (PROZAC ) 40 MG capsule Take 40 mg by mouth daily. (Takes in combination with 20 mg strength for total of 60 mg each morning) (Patient not taking: Reported on 11/27/2023)   metFORMIN  (GLUCOPHAGE -XR) 500 MG 24 hr tablet Take 2 tablets (1,000 mg total) by mouth 2 (two) times daily with a meal. (Patient not taking: Reported on 11/27/2023)   pioglitazone  (ACTOS ) 15 MG tablet TAKE 1 TABLET(15 MG) BY MOUTH DAILY (Patient not taking: Reported on 11/27/2023)   propranolol  (INDERAL ) 10 MG tablet Take 0.5 tablets (5 mg total) by mouth 2 (two) times daily. (Patient not taking: Reported on 11/27/2023)   [DISCONTINUED] ibuprofen (ADVIL,MOTRIN) 200 MG tablet Take 400 mg by mouth at bedtime. As needed (Patient not taking: Reported on 11/23/2023)   No facility-administered medications prior to visit.    Review of Systems All negative Except see HPI       Objective    BP (!) 136/55   Pulse 69   Resp 16   Ht 5' 5 (1.651 m)   Wt 164 lb (74.4 kg)   SpO2 98%   BMI 27.29 kg/m     Physical Exam Vitals reviewed.  Constitutional:      General: She is not in acute distress.    Appearance: Normal appearance. She is well-developed. She is not diaphoretic.  HENT:     Head: Normocephalic and atraumatic.  Eyes:     General: No scleral icterus.     Conjunctiva/sclera: Conjunctivae normal.  Neck:     Thyroid : No thyromegaly.  Cardiovascular:     Rate and Rhythm: Normal rate and regular rhythm.     Pulses: Normal pulses.     Heart sounds: Normal heart sounds. No murmur heard. Pulmonary:     Effort: Pulmonary effort is normal. No respiratory distress.     Breath sounds: Normal breath sounds. No wheezing, rhonchi or rales.  Musculoskeletal:     Cervical back: Neck supple.     Right lower leg: No edema.     Left lower leg: No edema.  Lymphadenopathy:  Cervical: No cervical adenopathy.  Skin:    General: Skin is warm and dry.     Findings: No rash.  Neurological:     Mental Status: She is alert and oriented to person, place, and time. Mental status is at baseline.  Psychiatric:        Mood and Affect: Mood normal.        Behavior: Behavior normal.      No results found for any visits on 11/27/23.      Assessment & Plan Type 2 diabetes mellitus Chronic A1c at 7.3 is within the normal limit but could be improved. Diet includes high-sugar foods. Inconsistent glucose monitoring due to lack of glucometer. - Recommend strict low-carb diet and refer to nutritionist. - Provide glucometer and test strips for regular monitoring. - Continue metformin  and pioglitazone . - Advise against store-bought orange juice with sugar.. Will follow-up  Hyperlipidemia Chronic Current Lipitor dosage may need adjustment based on recent labs. - Increase Lipitor to 60 mg daily. - Recheck cholesterol in 1-2 months. - Advise low cholesterol diet and regular exercise. Will follow-up  Elevated liver enzymes Mild elevation in alkaline phosphatase and liver enzymes, possibly linked to uncontrolled diabetes. - Monitor liver enzyme levels. - Discuss lab results during follow-up.  Depression/Anxiety/Panic attacks Chronic and unstable Under Dr.  care for antidepressant adjustments. Needs structured support and counseling. - Encourage  communication with Dr. Chipper. - Explore counseling options, including [facility]. Seeks integrative support beyond traditional counseling. - Encourage exploration of [facility] for counseling and psychiatric support. - Advise verification of insurance coverage with [facility].  Insomnia Chronic, most likely related to depression/anxiety Reports difficulty sleeping, seeking over-the-counter solutions. - Recommend melatonin as a sleep aid. - Advise establishing a bedtime ritual. - Suggest avoiding Tylenol  due to liver impact.  Type 2 diabetes mellitus with microalbuminuria, without long-term current use of insulin (HCC) (Primary)  - Amb ref to Medical Nutrition Therapy-MNT - Blood Glucose Monitoring Suppl DEVI; 1 each by Does not apply route as directed. Dispense based on patient and insurance preference. Use up to four times daily as directed. (FOR ICD-10 E10.9, E11.9).  Dispense: 1 each; Refill: 0 - Glucose Blood (BLOOD GLUCOSE TEST STRIPS) STRP; 1 each by Does not apply route as directed. Dispense based on patient and insurance preference. Use up to four times daily as directed. (FOR ICD-10 E10.9, E11.9).  Dispense: 100 strip; Refill: 0 - Lancet Device MISC; 1 each by Does not apply route as directed. Dispense based on patient and insurance preference. Use up to four times daily as directed. (FOR ICD-10 E10.9, E11.9).  Dispense: 1 each; Refill: 0 - Lancets MISC; 1 each by Does not apply route as directed. Dispense based on patient and insurance preference. Use up to four times daily as directed. (FOR ICD-10 E10.9, E11.9).  Dispense: 100 each; Refill: 0  Hypertension associated with diabetes (HCC) Chronic, unstable Continue current BP med Continue healthy diet and regular exercise - Amb ref to Medical Nutrition Therapy-MNT Will follow-up  Hyperlipidemia associated with type 2 diabetes mellitus (HCC) Chronic and stable Continue med regimen Continue low cholesterol and  daily exercise LP from 11/23/23 showed total chol 230 and ldl 138 - Amb ref to Medical Nutrition Therapy-MNT Will follow-up  Overweight (BMI 25.0-29.9) Chronic and stable Associated with DMII, HTN, HLD Body mass index is 27.29 kg/m. Continue  Weight loss of 5% of pt's current weight via healthy diet and daily exercise  Reviewed lab work from 11/23/23 Will follow-up  Orders Placed This Encounter  Procedures   Amb ref to Medical Nutrition Therapy-MNT    Referral Priority:   Routine    Referral Type:   Consultation    Referral Reason:   Specialty Services Required    Requested Specialty:   Nutrition    Number of Visits Requested:   1    Return in about 6 weeks (around 01/08/2024) for chronic disease f/u.   The patient was advised to call back or seek an in-person evaluation if the symptoms worsen or if the condition fails to improve as anticipated.  I discussed the assessment and treatment plan with the patient. The patient was provided an opportunity to ask questions and all were answered. The patient agreed with the plan and demonstrated an understanding of the instructions.  I, Gryphon Vanderveen, PA-C have reviewed all documentation for this visit. The documentation on 11/27/2023  for the exam, diagnosis, procedures, and orders are all accurate and complete.  Jolynn Spencer, [facility], MMS [facility] [phone] (phone) [phone] (fax)  [facility]

## 2023-12-03 ENCOUNTER — Encounter: Payer: Self-pay | Admitting: Physician Assistant

## 2023-12-03 DIAGNOSIS — R0602 Shortness of breath: Secondary | ICD-10-CM | POA: Diagnosis not present

## 2023-12-09 ENCOUNTER — Ambulatory Visit: Admitting: Nurse Practitioner

## 2023-12-09 DIAGNOSIS — R0602 Shortness of breath: Secondary | ICD-10-CM | POA: Diagnosis not present

## 2023-12-22 ENCOUNTER — Encounter: Payer: Self-pay | Admitting: Internal Medicine

## 2023-12-22 ENCOUNTER — Ambulatory Visit (INDEPENDENT_AMBULATORY_CARE_PROVIDER_SITE_OTHER): Admitting: Internal Medicine

## 2023-12-22 VITALS — BP 120/76 | HR 92 | Temp 98.8°F | Ht 65.0 in | Wt 161.2 lb

## 2023-12-22 DIAGNOSIS — G4733 Obstructive sleep apnea (adult) (pediatric): Secondary | ICD-10-CM

## 2023-12-22 DIAGNOSIS — R5381 Other malaise: Secondary | ICD-10-CM | POA: Diagnosis not present

## 2023-12-22 DIAGNOSIS — G4719 Other hypersomnia: Secondary | ICD-10-CM

## 2023-12-22 DIAGNOSIS — R0602 Shortness of breath: Secondary | ICD-10-CM

## 2023-12-22 NOTE — Patient Instructions (Addendum)
Recommend home sleep study to assess for sleep apnea 

## 2023-12-22 NOTE — Progress Notes (Signed)
 Name: Melissa Arroyo MRN: [id number] DOB: 02/21/1950    CHIEF COMPLAINT:  ASSESSMENT OF SLEEP APNEA Fatigue and daytime sleepiness Symptoms of insomnia September 2025 upper respiratory infection    HISTORY OF PRESENT ILLNESS: Patient is seen today for problems and issues with sleep related to excessive daytime sleepiness Patient  has been having sleep problems for many years Patient has been having excessive daytime sleepiness for a long time Patient has been having extreme fatigue and tiredness, lack of energy   Discussed sleep data and reviewed with patient.  Encouraged proper weight management.  Discussed driving precautions and its relationship with hypersomnolence.  Discussed operating dangerous equipment and its relationship with hypersomnolence.  Discussed sleep hygiene, and benefits of a fixed sleep waked time.  The importance of getting eight or more hours of sleep discussed with patient.  Discussed limiting the use of the computer and television before bedtime.  Decrease naps during the day, so night time sleep will become enhanced.  Limit caffeine, and sleep deprivation.  HTN, stroke, and heart failure are potential risk factors.       12/22/2023    9:00 AM  Results of the Epworth flowsheet  Sitting and reading 2  Watching TV 1  Sitting, inactive in a public place (e.g. a theatre or a meeting) 1  As a passenger in a car for an hour without a break 2  Lying down to rest in the afternoon when circumstances permit 3  Sitting and talking to someone 0  Sitting quietly after a lunch without alcohol  0  In a car, while stopped for a few minutes in traffic 0  Total score 20 October 2023 patient diagnosed with upper respiratory infection and bronchitis Patient had wheezing cough congestion was given antibiotics Currently her symptoms have resolved however she has some shortness of breath  I will obtain chest x-ray and pulmonary function testing for further  assessment   Patient does have a psychiatric history of panic attacks sees Dr. Ladona Sells Patient does have a diagnosis of insomnia patient had been on Lunesta and although that has not been-he is currently off all medications She does have a history of diabetes takes metformin   No exacerbation at this time No evidence of heart failure at this time No evidence or signs of infection at this time No respiratory distress No fevers, chills, nausea, vomiting, diarrhea No evidence of lower extremity edema No evidence hemoptysis   PAST MEDICAL HISTORY :   has a past medical history of Anxiety, Depression, Diabetes mellitus (HCC), Dyspnea, Heart attack (HCC) (04/11/2007), Heart attack (HCC), Hyperlipidemia, Hypertension, Obesity, Recurrent UTI, Renal lesion, Urinary incontinence in female, and Vaginal atrophy.  has a past surgical history that includes Bunionectomy (2001); Rotator cuff repair (Left); Neck surgery; Cardiac catheterization; Dental surgery; Colonoscopy with propofol  (N/A, 11/27/2020); and Breast biopsy (Right, 03/29/2021). Prior to Admission medications   Medication Sig Start Date End Date Taking? Authorizing Provider  DULoxetine (CYMBALTA) 30 MG capsule Take 30 mg by mouth daily. 11/26/23  Yes [provider]  FLUoxetine  (PROZAC ) 20 MG capsule Take 20 mg by mouth daily. 11/26/23  Yes [provider]  fluticasone  (FLONASE ) 50 MCG/ACT nasal spray Place 1 spray into both nostrils 2 (two) times daily. 10/29/23  Yes [provider]  atorvastatin  (LIPITOR) 40 MG tablet TAKE 1 TABLET(40 MG) BY MOUTH DAILY Patient not taking: Reported on 11/27/2023 11/06/23   Ostwalt, Janna, PA-C  b complex vitamins capsule Take 1 capsule by mouth daily.  [provider]  Berberine Chloride (BERBERINE HCI PO) Take by mouth.    [provider]  Blood Glucose Monitoring Suppl DEVI 1 each by Does not apply route as directed. Dispense based on patient and  insurance preference. Use up to four times daily as directed. (FOR ICD-10 E10.9, E11.9). 11/29/23   Ostwalt, Janna, PA-C  Cholecalciferol (VITAMIN D3) 250 MCG (10000 UT) capsule Take 10,000 Units by mouth daily.    [provider]  DHA-EPA-Vitamin E (OMEGA-3 COMPLEX PO) Take by mouth.    [provider]  estradiol  (ESTRACE ) 0.1 MG/GM vaginal cream Place 1 Applicatorful vaginally at bedtime. Patient not taking: Reported on 11/27/2023 05/14/20   Burnette, Jennifer M, PA-C  eszopiclone (LUNESTA) 1 MG TABS tablet Take 1 mg by mouth at bedtime as needed.    [provider]  Glucose Blood (BLOOD GLUCOSE TEST STRIPS) STRP 1 each by Does not apply route as directed. Dispense based on patient and insurance preference. Use up to four times daily as directed. (FOR ICD-10 E10.9, E11.9). 11/29/23   Ostwalt, Janna, PA-C  Lancet Device MISC 1 each by Does not apply route as directed. Dispense based on patient and insurance preference. Use up to four times daily as directed. (FOR ICD-10 E10.9, E11.9). 11/29/23   Ostwalt, Janna, PA-C  Lancets MISC 1 each by Does not apply route as directed. Dispense based on patient and insurance preference. Use up to four times daily as directed. (FOR ICD-10 E10.9, E11.9). 11/29/23   Ostwalt, Janna, PA-C  MAGNESIUM PO Take by mouth.    [provider]  metFORMIN  (GLUCOPHAGE -XR) 500 MG 24 hr tablet Take 2 tablets (1,000 mg total) by mouth 2 (two) times daily with a meal. Patient not taking: Reported on 11/27/2023 05/29/22   Emilio Kelly DASEN, FNP  Misc Natural Products (TURMERIC, CURCUMIN, PO) Take by mouth.    [provider]  Multiple Vitamins-Minerals (ZINC PO) Take by mouth.    [provider]  pioglitazone  (ACTOS ) 15 MG tablet TAKE 1 TABLET(15 MG) BY MOUTH DAILY Patient not taking: Reported on 11/27/2023 09/07/23   Ostwalt, Janna, PA-C  propranolol  (INDERAL ) 10 MG tablet Take 0.5 tablets (5 mg total) by mouth 2 (two) times  daily. Patient not taking: Reported on 11/27/2023 08/27/22   Emilio Kelly DASEN, FNP   Allergies  Allergen Reactions   Vesicare [Solifenacin] Anaphylaxis    Throat Closed   Penicillins Rash    FAMILY HISTORY:  family history includes Breast cancer in an other family member; Cancer in her father, mother, and another family member; Colon cancer in her mother; Diabetes in her mother; Healthy in her sister; Heart disease in her brother; Hypertension in her brother and mother; Leukemia in her mother; Lymphoma in her father; Rheum arthritis in her father; Uterine cancer in her maternal grandmother. SOCIAL HISTORY:  reports that she has never smoked. She has never been exposed to tobacco smoke. She has never used smokeless tobacco. She reports that she does not drink alcohol  and does not use drugs.   BP 120/76   Pulse 92   Temp 98.8 F (37.1 C)   Ht 5' 5 (1.651 m)   Wt 161 lb 3.2 oz (73.1 kg)   SpO2 100%   BMI 26.83 kg/m    Review of Systems: Gen:  Denies  fever, sweats, chills weight loss  HEENT: Denies blurred vision, double vision, ear pain, eye pain, hearing loss, nose bleeds, sore throat Cardiac:  No dizziness, chest pain or heaviness, chest  tightness,edema, No JVD Resp:   No cough, -sputum production, +shortness of breath,-wheezing, -hemoptysis,  Other:  All other systems negative   Physical Examination:   General Appearance: No distress  EYES PERRLA, EOM intact.   NECK Supple, No JVD Pulmonary: normal breath sounds, No wheezing.  CardiovascularNormal S1,S2.  No m/r/g.   Abdomen: Benign, Soft, non-tender. Neurology UE/LE 5/5 strength, no focal deficits Ext pulses intact, cap refill intact ALL OTHER ROS ARE NEGATIVE     ASSESSMENT AND PLAN SYNOPSIS 73 year old pleasant white female Patient with signs and symptoms of excessive daytime sleepiness fatigue with morning headaches with probable underlying diagnosis of obstructive sleep apnea in the setting  deconditioned state  with a recent bout of upper respiratory tract infection and bronchitis with persistent shortness of breath without any wheezing or chest congestion previous history of COVID in 2020   Recommend Home Sleep Study for definitve diagnosis  Deconditioned state -Recommend increased daily activity and exercise  History of URI bronchitis non-smoker Obtain pulmonary function testing Obtain chest x-ray It is a possibility patient may have prolonged COVID since 2020 with signs symptoms of wheezing cough-patient may have reactive airways disease and asthma At this time will not prescribe any inhalers will wait for chest x-ray and pulmonary function testing   MEDICATION ADJUSTMENTS/LABS AND TESTS ORDERED: Recommend Sleep Study Recommend chest x-ray Recommend pulmonary function testing Avoid Allergens and Irritants Avoid secondhand smoke Avoid SICK contacts Recommend  Masking  when appropriate Recommend Keep up-to-date with vaccinations    CURRENT MEDICATIONS REVIEWED AT LENGTH WITH PATIENT TODAY   Patient  satisfied with Plan of action and management. All questions answered   Follow up 3 months   I spent a total of 60 minutes dedicated to the care of this patient on the date of this encounter to include pre-visit review of records, face-to-face time with the patient discussing conditions above, post visit ordering of testing, clinical documentation with the electronic health record, making appropriate referrals as documented, and communicating necessary information to the patient's healthcare team.     Nickolas Alm Cellar, M.D.  [facility]  Medical Director [facility]

## 2023-12-23 DIAGNOSIS — R0602 Shortness of breath: Secondary | ICD-10-CM | POA: Diagnosis not present

## 2023-12-23 DIAGNOSIS — I10 Essential (primary) hypertension: Secondary | ICD-10-CM | POA: Diagnosis not present

## 2023-12-23 DIAGNOSIS — E782 Mixed hyperlipidemia: Secondary | ICD-10-CM | POA: Diagnosis not present

## 2023-12-23 DIAGNOSIS — E119 Type 2 diabetes mellitus without complications: Secondary | ICD-10-CM | POA: Diagnosis not present

## 2023-12-30 ENCOUNTER — Ambulatory Visit: Admitting: Internal Medicine

## 2023-12-30 DIAGNOSIS — F39 Unspecified mood [affective] disorder: Secondary | ICD-10-CM | POA: Diagnosis not present

## 2023-12-30 DIAGNOSIS — F4312 Post-traumatic stress disorder, chronic: Secondary | ICD-10-CM | POA: Diagnosis not present

## 2023-12-30 DIAGNOSIS — F410 Panic disorder [episodic paroxysmal anxiety] without agoraphobia: Secondary | ICD-10-CM | POA: Diagnosis not present

## 2023-12-30 DIAGNOSIS — F900 Attention-deficit hyperactivity disorder, predominantly inattentive type: Secondary | ICD-10-CM | POA: Diagnosis not present

## 2024-01-05 ENCOUNTER — Ambulatory Visit: Payer: Self-pay | Admitting: Physician Assistant

## 2024-01-08 ENCOUNTER — Encounter

## 2024-01-09 NOTE — Progress Notes (Signed)
 Established patient visit  Patient: Melissa Arroyo   DOB: [birth year]   73 y.o. Female  MRN: [id number] Visit Date: 01/12/2024  Today's healthcare provider: Jolynn Spencer, PA-C   Chief Complaint  Patient presents with   Follow-up    6 week f/u   itchy feet    Bilateral foot itchiness when going to sleep otc: benadryl, hydrocortisone cream   oxygen    Pt has noticed in the past 2 weeks, 3 times her o2 has dropped to 89 especially after waking up. Goes back to see her sleep study doctor in January.   Subjective     Discussed the use of AI scribe software for clinical note transcription with the patient, who gave verbal consent to proceed.  History of Present Illness Melissa Arroyo is a 73 year old female with diabetes who presents with concerns about blood sugar fluctuations and itching of the feet.  She reports recent blood sugar fluctuations with readings as low as  70.2. Blood pressure has been in the 120s-130s/70s-80s. She takes metformin  2000 mg daily and pioglitazone  in the morning for diabetes.  She has severe itching on the bottoms of both feet, worse at night and rated 10/10. Hydrocortisone cream and Benadryl relieve symptoms. She wonders if metformin  is causing this. She denies burning, numbness, or other neuropathic symptoms.  She had a myocardial infarction in 2009 and was told it has healed on recent cardiology follow up. She treats anxiety and depression with fluoxetine , duloxetine, and therapy, with improved mood. She has occasional mild panic attacks and uses propranolol  rarely.  She is on disability and not currently working.   Check labs    11/06/2023   10:18 AM 11/26/2022    9:03 AM 08/27/2022    2:37 PM  Depression screen PHQ 2/9  Decreased Interest 3 1 1   Down, Depressed, Hopeless 3 1 1   PHQ - 2 Score 6 2 2   Altered sleeping 3 1 0  Tired, decreased energy 3 1 1   Change in appetite 0 0 0  Feeling bad or failure about yourself  1 0 1  Trouble  concentrating 0 0 1  Moving slowly or fidgety/restless 2 0 1  Suicidal thoughts 0 0 0  PHQ-9 Score 15  4  6    Difficult doing work/chores Very difficult  Somewhat difficult     Data saved with a previous flowsheet row definition      11/06/2023   10:18 AM 11/26/2022    9:03 AM 12/03/2020    8:51 AM 11/19/2020    1:43 PM  GAD 7 : Generalized Anxiety Score  Nervous, Anxious, on Edge 2 1 1 3   Control/stop worrying 2 1 1 3   Worry too much - different things 2 0 1 3  Trouble relaxing 2 0 1 1  Restless 0 0 0 0  Easily annoyed or irritable 2 0 1 1  Afraid - awful might happen 1 0 0 3  Total GAD 7 Score 11 2 5 14   Anxiety Difficulty Very difficult  Somewhat difficult Extremely difficult    Medications: Outpatient Medications Prior to Visit  Medication Sig   atorvastatin  (LIPITOR) 40 MG tablet Take 60 mg by mouth daily.   b complex vitamins capsule Take 1 capsule by mouth daily.   Berberine Chloride (BERBERINE HCI PO) Take by mouth.   Blood Glucose Monitoring Suppl DEVI 1 each by Does not apply route as directed. Dispense based on patient and insurance preference. Use up to four times  daily as directed. (FOR ICD-10 E10.9, E11.9).   Cholecalciferol (VITAMIN D -1000 MAX ST) 25 MCG (1000 UT) tablet Take by mouth.   DHA-EPA-Vitamin E (OMEGA-3 COMPLEX PO) Take by mouth.   DULoxetine (CYMBALTA) 30 MG capsule Take 30 mg by mouth daily.   estradiol  (ESTRACE ) 0.1 MG/GM vaginal cream Place 1 Applicatorful vaginally at bedtime.   eszopiclone (LUNESTA) 1 MG TABS tablet Take 1 mg by mouth at bedtime as needed.   FLUoxetine  (PROZAC ) 20 MG capsule Take 20 mg by mouth daily.   fluticasone  (FLONASE ) 50 MCG/ACT nasal spray Place 1 spray into both nostrils 2 (two) times daily.   Glucose Blood (BLOOD GLUCOSE TEST STRIPS) STRP 1 each by Does not apply route as directed. Dispense based on patient and insurance preference. Use up to four times daily as directed. (FOR ICD-10 E10.9, E11.9).   Lancet Device MISC  1 each by Does not apply route as directed. Dispense based on patient and insurance preference. Use up to four times daily as directed. (FOR ICD-10 E10.9, E11.9).   Lancets MISC 1 each by Does not apply route as directed. Dispense based on patient and insurance preference. Use up to four times daily as directed. (FOR ICD-10 E10.9, E11.9).   metFORMIN  (GLUCOPHAGE -XR) 500 MG 24 hr tablet Take 2 tablets (1,000 mg total) by mouth 2 (two) times daily with a meal.   Misc Natural Products (TURMERIC, CURCUMIN, PO) Take by mouth.   Multiple Vitamins-Minerals (ZINC PO) Take by mouth.   pioglitazone  (ACTOS ) 15 MG tablet TAKE 1 TABLET(15 MG) BY MOUTH DAILY   propranolol  (INDERAL ) 10 MG tablet Take 0.5 tablets (5 mg total) by mouth 2 (two) times daily.   Vitamin E (VITAMIN E/D-ALPHA NATURAL) 268 MG (400 UNIT) CAPS Take 400 Units by mouth.   [DISCONTINUED] atorvastatin  (LIPITOR) 40 MG tablet TAKE 1 TABLET(40 MG) BY MOUTH DAILY   [DISCONTINUED] Cholecalciferol (VITAMIN D3) 250 MCG (10000 UT) capsule Take 10,000 Units by mouth daily.   [DISCONTINUED] MAGNESIUM PO Take by mouth.   No facility-administered medications prior to visit.    Review of Systems  All other systems reviewed and are negative.  All negative Except see HPI       Objective    BP (!) 126/50   Pulse 63   Resp 14   Ht 5' 5 (1.651 m)   Wt 163 lb 4.8 oz (74.1 kg)   SpO2 100%   BMI 27.17 kg/m     Physical Exam Vitals reviewed.  Constitutional:      General: She is not in acute distress.    Appearance: Normal appearance. She is well-developed. She is not diaphoretic.  HENT:     Head: Normocephalic and atraumatic.  Eyes:     General: No scleral icterus.    Conjunctiva/sclera: Conjunctivae normal.  Neck:     Thyroid : No thyromegaly.  Cardiovascular:     Rate and Rhythm: Normal rate and regular rhythm.     Pulses: Normal pulses.     Heart sounds: Normal heart sounds. No murmur heard. Pulmonary:     Effort: Pulmonary  effort is normal. No respiratory distress.     Breath sounds: Normal breath sounds. No wheezing, rhonchi or rales.  Musculoskeletal:     Cervical back: Neck supple.     Right lower leg: No edema.     Left lower leg: No edema.  Lymphadenopathy:     Cervical: No cervical adenopathy.  Skin:    General: Skin is warm and dry.  Findings: No rash.  Neurological:     Mental Status: She is alert and oriented to person, place, and time. Mental status is at baseline.  Psychiatric:        Mood and Affect: Mood normal.        Behavior: Behavior normal.      No results found for any visits on 01/12/24.      Assessment & Plan Type 2 diabetes mellitus with complications Chronic and stable  blood glucose levels fluctuate between 72.2 and 142.2 mg/dL. Pruritus of feet may be related to metformin  use,per pt - Advised on foot care: wash, dry, apply moisturizing cream with hydrocortisone. - Monitor blood glucose and blood pressure regularly. Continue lifestyle modifications Will follow-up  Pruritus of feet Severe itching, particularly at night, possibly related to metformin  use. - Apply hydrocortisone cream and moisturizing lotion. - Massage feet after applying creams. Will reassess  Anxiety disorder/panic attack Chronic, previously controlled  mild panic attack, possibly triggered by social situations. Therapy with David Koneski is beneficial.  - Continue therapy with Alm Russian. - Keep propranolol  available for severe panic attacks. Will follow-up  Depression Chronic, improving with current therapy regimen. Declined increasing duloxetine dosage. - Continue fluoxetine  and duloxetine. Continue therapy sessions With follow-up with Dr. Chipper  Hypertension associated with diabetes (HCC) Chronic and stable Continue current blood pressure medications Continue lifestyle modifications Unclear about medical nutritionist therapy. Will follow-up  Hyperlipidemia associated with type 2  diabetes mellitus (HCC) Chronic Lipitor was increased to 60 mg Plan to recheck cholesterol as a follow-up Continue lifestyle modifications Will follow-up  Daytime sleepiness Was advised to proceed with sleep studies Follow-up with Dr. Isaiah  No orders of the defined types were placed in this encounter.   No follow-ups on file.   The patient was advised to call back or seek an in-person evaluation if the symptoms worsen or if the condition fails to improve as anticipated.  I discussed the assessment and treatment plan with the patient. The patient was provided an opportunity to ask questions and all were answered. The patient agreed with the plan and demonstrated an understanding of the instructions.  I, Daymen Hassebrock, PA-C have reviewed all documentation for this visit. The documentation on 01/12/2024  for the exam, diagnosis, procedures, and orders are all accurate and complete.  Jolynn Spencer, [facility], MMS [facility] [phone] (phone) [phone] (fax)  [facility]

## 2024-01-12 ENCOUNTER — Ambulatory Visit
Admission: RE | Admit: 2024-01-12 | Discharge: 2024-01-12 | Disposition: A | Attending: Internal Medicine | Admitting: Internal Medicine

## 2024-01-12 ENCOUNTER — Ambulatory Visit: Admitting: Physician Assistant

## 2024-01-12 ENCOUNTER — Ambulatory Visit
Admission: RE | Admit: 2024-01-12 | Discharge: 2024-01-12 | Disposition: A | Source: Ambulatory Visit | Attending: Internal Medicine | Admitting: Internal Medicine

## 2024-01-12 ENCOUNTER — Encounter: Payer: Self-pay | Admitting: Physician Assistant

## 2024-01-12 VITALS — BP 126/50 | HR 63 | Resp 14 | Ht 65.0 in | Wt 163.3 lb

## 2024-01-12 DIAGNOSIS — E785 Hyperlipidemia, unspecified: Secondary | ICD-10-CM | POA: Diagnosis not present

## 2024-01-12 DIAGNOSIS — E1159 Type 2 diabetes mellitus with other circulatory complications: Secondary | ICD-10-CM | POA: Diagnosis not present

## 2024-01-12 DIAGNOSIS — R400 Somnolence: Secondary | ICD-10-CM | POA: Insufficient documentation

## 2024-01-12 DIAGNOSIS — R0602 Shortness of breath: Secondary | ICD-10-CM | POA: Diagnosis not present

## 2024-01-12 DIAGNOSIS — F419 Anxiety disorder, unspecified: Secondary | ICD-10-CM | POA: Diagnosis not present

## 2024-01-12 DIAGNOSIS — E1129 Type 2 diabetes mellitus with other diabetic kidney complication: Secondary | ICD-10-CM

## 2024-01-12 DIAGNOSIS — Z981 Arthrodesis status: Secondary | ICD-10-CM | POA: Diagnosis not present

## 2024-01-12 DIAGNOSIS — E1169 Type 2 diabetes mellitus with other specified complication: Secondary | ICD-10-CM

## 2024-01-12 DIAGNOSIS — I152 Hypertension secondary to endocrine disorders: Secondary | ICD-10-CM | POA: Diagnosis not present

## 2024-01-12 DIAGNOSIS — L299 Pruritus, unspecified: Secondary | ICD-10-CM | POA: Insufficient documentation

## 2024-01-12 DIAGNOSIS — Z7984 Long term (current) use of oral hypoglycemic drugs: Secondary | ICD-10-CM | POA: Diagnosis not present

## 2024-01-12 DIAGNOSIS — F339 Major depressive disorder, recurrent, unspecified: Secondary | ICD-10-CM

## 2024-01-12 DIAGNOSIS — E663 Overweight: Secondary | ICD-10-CM | POA: Diagnosis not present

## 2024-01-12 DIAGNOSIS — R748 Abnormal levels of other serum enzymes: Secondary | ICD-10-CM

## 2024-01-12 DIAGNOSIS — F410 Panic disorder [episodic paroxysmal anxiety] without agoraphobia: Secondary | ICD-10-CM | POA: Diagnosis not present

## 2024-01-12 DIAGNOSIS — R809 Proteinuria, unspecified: Secondary | ICD-10-CM | POA: Diagnosis not present

## 2024-01-14 ENCOUNTER — Ambulatory Visit: Payer: Self-pay | Admitting: Internal Medicine

## 2024-01-18 ENCOUNTER — Encounter: Admitting: Dietician

## 2024-01-21 DIAGNOSIS — R069 Unspecified abnormalities of breathing: Secondary | ICD-10-CM | POA: Diagnosis not present

## 2024-01-21 DIAGNOSIS — G4733 Obstructive sleep apnea (adult) (pediatric): Secondary | ICD-10-CM | POA: Diagnosis not present

## 2024-02-01 ENCOUNTER — Telehealth: Payer: Self-pay | Admitting: Internal Medicine

## 2024-02-01 NOTE — Telephone Encounter (Signed)
 Dr. Isaiah placed an order for new cpap setup. The order was sent to [facility]. We received a note from [facility]  Patient has declined setup for Cpap due to high out of pocket cost thru insurance.

## 2024-03-03 ENCOUNTER — Encounter: Payer: Self-pay | Admitting: Internal Medicine

## 2024-03-03 ENCOUNTER — Ambulatory Visit

## 2024-03-03 ENCOUNTER — Ambulatory Visit: Admitting: Internal Medicine

## 2024-03-03 VITALS — BP 140/80 | HR 73 | Temp 97.9°F | Ht 65.0 in | Wt 166.0 lb

## 2024-03-03 DIAGNOSIS — G4733 Obstructive sleep apnea (adult) (pediatric): Secondary | ICD-10-CM | POA: Diagnosis not present

## 2024-03-03 DIAGNOSIS — R519 Headache, unspecified: Secondary | ICD-10-CM | POA: Diagnosis not present

## 2024-03-03 DIAGNOSIS — R0602 Shortness of breath: Secondary | ICD-10-CM

## 2024-03-03 LAB — PULMONARY FUNCTION TEST
DL/VA % pred: 111 %
DL/VA: 4.59 ml/min/mmHg/L
DLCO unc % pred: 97 %
DLCO unc: 19.34 ml/min/mmHg
FEF 25-75 Post: 2.35 L/s
FEF 25-75 Pre: 1.8 L/s
FEF2575-%Change-Post: 30 %
FEF2575-%Pred-Post: 126 %
FEF2575-%Pred-Pre: 96 %
FEV1-%Change-Post: 4 %
FEV1-%Pred-Post: 93 %
FEV1-%Pred-Pre: 89 %
FEV1-Post: 2.16 L
FEV1-Pre: 2.07 L
FEV1FVC-%Change-Post: 8 %
FEV1FVC-%Pred-Pre: 103 %
FEV6-%Change-Post: -2 %
FEV6-%Pred-Post: 87 %
FEV6-%Pred-Pre: 90 %
FEV6-Post: 2.56 L
FEV6-Pre: 2.63 L
FEV6FVC-%Change-Post: 1 %
FEV6FVC-%Pred-Post: 104 %
FEV6FVC-%Pred-Pre: 103 %
FVC-%Change-Post: -3 %
FVC-%Pred-Post: 83 %
FVC-%Pred-Pre: 87 %
FVC-Post: 2.56 L
FVC-Pre: 2.66 L
Post FEV1/FVC ratio: 84 %
Post FEV6/FVC ratio: 100 %
Pre FEV1/FVC ratio: 78 %
Pre FEV6/FVC Ratio: 99 %
RV % pred: 105 %
RV: 2.4 L
TLC % pred: 97 %
TLC: 5.1 L

## 2024-03-03 NOTE — Patient Instructions (Signed)
 Follow up with PCP for any referrals necessary to evaluate for, and potentially manage Anxiety.   Increase physical activity as tolerated -- try walking indoors such as in a shopping mall during the cold weather.  Try breathing exercises to help manage your shortness of breath and anxiety.

## 2024-03-03 NOTE — Patient Instructions (Signed)
 Full PFT completed today ? ?

## 2024-03-03 NOTE — Progress Notes (Signed)
 Full PFT completed today ? ?

## 2024-03-03 NOTE — Progress Notes (Signed)
 "  Name: TERREE GAULTNEY MRN: [id number] DOB: [birth year]    HST December 2025 AHI 13-14  CXR 01/2024 No pneumonia no effusions no edema Normal chest x-ray  Pulmonary function testing January 2026 FEV1 FVC postbronchodilator ratio is 111% FEV1 is 93% predicted postbronchodilator FVC is 83% predicted postbronchodilator no significant bronchodilator response TLC is 97% predicted RV is 1 to 5% predicted DLCO is 97% predicted Flow volume loops are within normal limits Findings are consistent with normal pulmonary function testing No evidence of significant obstructive or restrictive lung disease There is possibility of small obstructive airways disease with bronchodilator response clinical correlation advised  findings reviewed in detail with patient    CHIEF COMPLAINT:  Follow-up assessment for sleep apnea Follow-up assessment for shortness of breath    HISTORY OF PRESENT ILLNESS: Home sleep study December 2025 shows AHI of 13 Therapeutic options discussed with patient With symptoms consider starting auto CPAP therapy Patient states she cannot afford CPAP at this time however we will revisit this at the next 6 months  September 2025 patient diagnosed with upper respiratory infection and bronchitis Patient had wheezing cough congestion was given antibiotics Pulmonary function testing-does not show any significant obstructive or restrictive lung disease Chest x-ray in December did not show any significant abnormalities  Patient dealing with a lot of panic attacks and anxiety Recommend follow-up with psychiatry I do recommend increased exercise capacity and tolerance Patient has a deconditioned state   Patient does have a psychiatric history of panic attacks sees Dr. Arthi Kapoor Patient does have a diagnosis of insomnia patient had been on Lunesta and although that has not been-he is currently off all medications She does have a history of diabetes takes metformin    PAST  MEDICAL HISTORY :   has a past medical history of Anxiety, Depression, Diabetes mellitus (HCC), Dyspnea, Heart attack (HCC) (04/11/2007), Heart attack (HCC), Hyperlipidemia, Hypertension, Obesity, Recurrent UTI, Renal lesion, Urinary incontinence in female, and Vaginal atrophy.  has a past surgical history that includes Bunionectomy (2001); Rotator cuff repair (Left); Neck surgery; Cardiac catheterization; Dental surgery; Colonoscopy with propofol  (N/A, 11/27/2020); and Breast biopsy (Right, 03/29/2021). Prior to Admission medications   Medication Sig Start Date End Date Taking? Authorizing Provider  DULoxetine (CYMBALTA) 30 MG capsule Take 30 mg by mouth daily. 11/26/23  Yes [provider]  FLUoxetine  (PROZAC ) 20 MG capsule Take 20 mg by mouth daily. 11/26/23  Yes [provider]  fluticasone  (FLONASE ) 50 MCG/ACT nasal spray Place 1 spray into both nostrils 2 (two) times daily. 10/29/23  Yes [provider]  atorvastatin  (LIPITOR) 40 MG tablet TAKE 1 TABLET(40 MG) BY MOUTH DAILY Patient not taking: Reported on 11/27/2023 11/06/23   Ostwalt, Janna, PA-C  b complex vitamins capsule Take 1 capsule by mouth daily.    [provider]  Berberine Chloride (BERBERINE HCI PO) Take by mouth.    [provider]  Blood Glucose Monitoring Suppl DEVI 1 each by Does not apply route as directed. Dispense based on patient and insurance preference. Use up to four times daily as directed. (FOR ICD-10 E10.9, E11.9). 11/29/23   Ostwalt, Janna, PA-C  Cholecalciferol (VITAMIN D3) 250 MCG (10000 UT) capsule Take 10,000 Units by mouth daily.    [provider]  DHA-EPA-Vitamin E (OMEGA-3 COMPLEX PO) Take by mouth.    [provider]  estradiol  (ESTRACE ) 0.1 MG/GM vaginal cream Place 1 Applicatorful vaginally at bedtime. Patient not taking: Reported on 11/27/2023 05/14/20   Burnette, Jennifer M, PA-C  eszopiclone (LUNESTA) 1 MG TABS tablet Take 1 mg by mouth at  bedtime as needed.    [provider]  Glucose Blood (BLOOD GLUCOSE TEST STRIPS) STRP 1 each by Does not apply route as directed. Dispense based on patient and insurance preference. Use up to four times daily as directed. (FOR ICD-10 E10.9, E11.9). 11/29/23   Ostwalt, Janna, PA-C  Lancet Device MISC 1 each by Does not apply route as directed. Dispense based on patient and insurance preference. Use up to four times daily as directed. (FOR ICD-10 E10.9, E11.9). 11/29/23   Ostwalt, Janna, PA-C  Lancets MISC 1 each by Does not apply route as directed. Dispense based on patient and insurance preference. Use up to four times daily as directed. (FOR ICD-10 E10.9, E11.9). 11/29/23   Ostwalt, Janna, PA-C  MAGNESIUM PO Take by mouth.    [provider]  metFORMIN  (GLUCOPHAGE -XR) 500 MG 24 hr tablet Take 2 tablets (1,000 mg total) by mouth 2 (two) times daily with a meal. Patient not taking: Reported on 11/27/2023 05/29/22   Emilio Kelly DASEN, FNP  Misc Natural Products (TURMERIC, CURCUMIN, PO) Take by mouth.    [provider]  Multiple Vitamins-Minerals (ZINC PO) Take by mouth.    [provider]  pioglitazone  (ACTOS ) 15 MG tablet TAKE 1 TABLET(15 MG) BY MOUTH DAILY Patient not taking: Reported on 11/27/2023 09/07/23   Ostwalt, Janna, PA-C  propranolol  (INDERAL ) 10 MG tablet Take 0.5 tablets (5 mg total) by mouth 2 (two) times daily. Patient not taking: Reported on 11/27/2023 08/27/22   Emilio Kelly DASEN, FNP   Allergies  Allergen Reactions   Vesicare [Solifenacin] Anaphylaxis    Throat Closed   Penicillins Rash    FAMILY HISTORY:  family history includes Breast cancer in an other family member; Cancer in her father, mother, and another family member; Colon cancer in her mother; Diabetes in her mother; Healthy in her sister; Heart disease in her brother; Hypertension in her brother and mother; Leukemia in her mother; Lymphoma in her father; Rheum arthritis in her father;  Uterine cancer in her maternal grandmother. SOCIAL HISTORY:  reports that she has never smoked. She has never been exposed to tobacco smoke. She has never used smokeless tobacco. She reports that she does not drink alcohol  and does not use drugs.  BP (!) 140/80 Comment: just finished PFT  Pulse 73   Temp 97.9 F (36.6 C)   Ht 5' 5 (1.651 m)   Wt 166 lb (75.3 kg)   SpO2 100%   BMI 27.62 kg/m     Physical Examination:  General Appearance: No distress  EYES EOM intact.   NECK Supple, No JVD Pulmonary: normal breath sounds, No wheezing.  CardiovascularNormal S1,S2.  No m/r/g.   Ext pulses intact, cap refill intact  ALL OTHER ROS ARE NEGATIVE      ASSESSMENT AND PLAN SYNOPSIS 74 year old pleasant white female Patient with signs and symptoms of excessive daytime sleepiness fatigue with morning headaches with  underlying diagnosis of obstructive sleep apnea AHI 13-14 in the setting  deconditioned state with some intermittent shortness of breath   OSA AHI 13-14 Consider starting autoCPAP 4-12, mask of choice Patient refuses at this time will reassess in 6 months  History of URI bronchitis non-smoker Pulmonary function testing reviewed in detail Chest x-ray reviewed in detail It is a possibility patient may have prolonged COVID since 2020 with signs symptoms of wheezing cough-patient may have reactive airways disease and asthma Patient doing well  overall asked to increase exercise capacity No indication for inhaler therapy at this time  MEDICATION ADJUSTMENTS/LABS AND TESTS ORDERED: Consider starting CPAP therapy Increase exercise capacity and tolerance Avoid secondhand smoke Avoid sick contacts No indication for inhalers at this time    CURRENT MEDICATIONS REVIEWED AT LENGTH WITH PATIENT TODAY   Patient  satisfied with Plan of action and management. All questions answered   Follow up 6 months   I spent a total of 45 minutes dedicated to the care of this  patient on the date of this encounter to include pre-visit review of records, face-to-face time with the patient discussing conditions above, post visit ordering of testing, clinical documentation with the electronic health record, making appropriate referrals as documented, and communicating necessary information to the patient's healthcare team.    The Patient requires high complexity decision making for assessment and support, frequent evaluation and titration of therapies, application of advanced monitoring technologies and extensive interpretation of multiple databases.  Patient satisfied with Plan of action and management. All questions answered    Nickolas Alm Cellar, M.D.  [facility]  Medical Director [facility]      "

## 2024-03-14 ENCOUNTER — Other Ambulatory Visit: Payer: Self-pay | Admitting: Physician Assistant

## 2024-03-14 ENCOUNTER — Ambulatory Visit: Admitting: Internal Medicine

## 2024-03-14 DIAGNOSIS — E1129 Type 2 diabetes mellitus with other diabetic kidney complication: Secondary | ICD-10-CM

## 2024-03-14 NOTE — Telephone Encounter (Unsigned)
 Copied from CRM #[id number]. Topic: Clinical - Medication Refill >> Mar 14, 2024  3:40 PM Yolanda T wrote: Medication: pioglitazone  (ACTOS ) 15 MG tablet  Has the patient contacted their pharmacy? Yes  This is the patient's preferred pharmacy:  [id number] - [place], [place] - [address] AT [facility] [address] [place] [address] [place] [address] Phone: [phone] Fax: [phone]  Is this the correct pharmacy for this prescription? Yes  Has the prescription been filled recently? Yes  Is the patient out of the medication? Yes  Has the patient been seen for an appointment in the last year OR does the patient have an upcoming appointment? Yes  Can we respond through MyChart? Yes  Agent: Please be advised that Rx refills may take up to 3 business days. We ask that you follow-up with your pharmacy.

## 2024-03-16 MED ORDER — PIOGLITAZONE HCL 15 MG PO TABS: 15.0000 mg | ORAL_TABLET | Freq: Every day | ORAL | 0 refills | Status: AC

## 2024-03-16 NOTE — Telephone Encounter (Signed)
 Requested Prescriptions  Pending Prescriptions Disp Refills   pioglitazone  (ACTOS ) 15 MG tablet 90 tablet 0    Sig: Take 1 tablet (15 mg total) by mouth daily.     Endocrinology:  Diabetes - Glitazones - pioglitazone  Passed - 03/16/2024 10:16 AM      Passed - HBA1C is between 0 and 7.9 and within 180 days    HbA1c POC (<> result, manual entry)  Date Value Ref Range Status  08/27/2022 6.1 4.0 - 5.6 % Final   Hgb A1c MFr Bld  Date Value Ref Range Status  11/23/2023 7.3 (H) 4.8 - 5.6 % Final    Comment:             Prediabetes: 5.7 - 6.4          Diabetes: >6.4          Glycemic control for adults with diabetes: <7.0    A1c  Date Value Ref Range Status  10/27/2023 6.8  Final         Passed - Valid encounter within last 6 months    Recent Outpatient Visits           2 months ago Type 2 diabetes mellitus with microalbuminuria, without long-term current use of insulin (HCC)   [facility] [place], [place], PA-C   3 months ago Type 2 diabetes mellitus with microalbuminuria, without long-term current use of insulin [facility])   [facility] [place], [place], PA-C   3 months ago Dizziness   [facility] [place] Nancyann BRAVO, MD   3 months ago Other form of dyspnea   [employer] [facility] [place], [place], PA-C   4 months ago Other cough   [facility] [place], Janna, PA-C

## 2024-03-18 ENCOUNTER — Other Ambulatory Visit: Payer: Self-pay | Admitting: Physician Assistant

## 2024-03-18 NOTE — Telephone Encounter (Signed)
 Janna per your last note you increased pt Lipitor to 60mg . Looks like medication was not sent in during her last visit. Only options to send for Atorvastatin  is 20mg , 40mg  and 80mg . I am not sure which dosage you would like to send for pt in order for her to take 60mg . Please review and send to pharmacy with quantity and refills.
